# Patient Record
Sex: Female | Born: 1935 | Race: Black or African American | Hispanic: No | Smoking: Former smoker
Health system: Southern US, Community
[De-identification: ages and names within clinical notes are randomized; demographics above are authoritative.]

## PROBLEM LIST (undated history)

## (undated) DIAGNOSIS — K219 Gastro-esophageal reflux disease without esophagitis: Secondary | ICD-10-CM

## (undated) DIAGNOSIS — E119 Type 2 diabetes mellitus without complications: Secondary | ICD-10-CM

## (undated) DIAGNOSIS — E7800 Pure hypercholesterolemia, unspecified: Secondary | ICD-10-CM

## (undated) DIAGNOSIS — M1990 Unspecified osteoarthritis, unspecified site: Secondary | ICD-10-CM

## (undated) DIAGNOSIS — I10 Essential (primary) hypertension: Secondary | ICD-10-CM

## (undated) HISTORY — DX: Pure hypercholesterolemia, unspecified: E78.00

## (undated) HISTORY — PX: ABDOMINAL HYSTERECTOMY: SHX81

## (undated) HISTORY — PX: JOINT REPLACEMENT: SHX530

## (undated) HISTORY — DX: Gastro-esophageal reflux disease without esophagitis: K21.9

## (undated) HISTORY — PX: EYE SURGERY: SHX253

## (undated) HISTORY — PX: FRACTURE SURGERY: SHX138

---

## 1978-12-30 HISTORY — PX: HYSTERECTOMY: SHX81

## 1993-10-06 ENCOUNTER — Ambulatory Visit: Admission: RE | Admit: 1993-10-06 | Payer: Self-pay | Source: Ambulatory Visit | Admitting: Gastroenterology

## 1993-10-13 ENCOUNTER — Ambulatory Visit
Admit: 1993-10-13 | Disposition: A | Discharge status: Home / Self Care | Payer: Self-pay | Source: Ambulatory Visit | Admitting: Rheumatology

## 1993-11-04 ENCOUNTER — Inpatient Hospital Stay
Admission: EM | Admit: 1993-11-04 | Disposition: A | Discharge status: Home / Self Care | Payer: Self-pay | Source: Emergency Department | Admitting: Internal Medicine

## 1994-03-15 ENCOUNTER — Ambulatory Visit
Admit: 1994-03-15 | Disposition: A | Discharge status: Home / Self Care | Payer: Self-pay | Source: Ambulatory Visit | Admitting: Internal Medicine

## 1994-05-28 ENCOUNTER — Ambulatory Visit
Admit: 1994-05-28 | Disposition: A | Discharge status: Home / Self Care | Payer: Self-pay | Source: Ambulatory Visit | Admitting: Internal Medicine

## 1994-06-21 ENCOUNTER — Inpatient Hospital Stay
Admission: EM | Admit: 1994-06-21 | Disposition: A | Discharge status: Home / Self Care | Payer: Self-pay | Source: Emergency Department | Admitting: Internal Medicine

## 1994-07-06 ENCOUNTER — Emergency Department: Admit: 1994-07-06 | Payer: Self-pay | Admitting: Emergency Medicine

## 1994-09-30 ENCOUNTER — Emergency Department: Admit: 1994-09-30 | Payer: Self-pay | Admitting: Emergency Medicine

## 1995-03-22 ENCOUNTER — Ambulatory Visit: Admission: RE | Admit: 1995-03-22 | Payer: Self-pay | Source: Ambulatory Visit | Admitting: Gastroenterology

## 1995-04-17 ENCOUNTER — Inpatient Hospital Stay
Admission: EM | Admit: 1995-04-17 | Disposition: A | Discharge status: Home / Self Care | Payer: Self-pay | Source: Emergency Department | Admitting: Internal Medicine

## 1996-02-28 ENCOUNTER — Ambulatory Visit: Admission: RE | Admit: 1996-02-28 | Payer: Self-pay | Source: Ambulatory Visit | Admitting: Gastroenterology

## 1996-03-11 ENCOUNTER — Ambulatory Visit
Admit: 1996-03-11 | Disposition: A | Discharge status: Home / Self Care | Payer: Self-pay | Source: Ambulatory Visit | Admitting: Internal Medicine

## 1996-11-05 ENCOUNTER — Ambulatory Visit
Admit: 1996-11-05 | Disposition: A | Discharge status: Home / Self Care | Payer: Self-pay | Source: Ambulatory Visit | Admitting: Internal Medicine

## 1996-12-18 ENCOUNTER — Ambulatory Visit: Admission: RE | Admit: 1996-12-18 | Payer: Self-pay | Source: Ambulatory Visit | Admitting: Gastroenterology

## 1997-03-05 ENCOUNTER — Ambulatory Visit
Admit: 1997-03-05 | Disposition: A | Discharge status: Home / Self Care | Payer: Self-pay | Source: Ambulatory Visit | Admitting: Internal Medicine

## 1997-06-08 ENCOUNTER — Ambulatory Visit
Admit: 1997-06-08 | Disposition: A | Discharge status: Home / Self Care | Payer: Self-pay | Source: Ambulatory Visit | Admitting: Gastroenterology

## 1997-11-08 ENCOUNTER — Ambulatory Visit
Admit: 1997-11-08 | Disposition: A | Discharge status: Home / Self Care | Payer: Self-pay | Source: Ambulatory Visit | Admitting: Obstetrics & Gynecology

## 1997-11-16 ENCOUNTER — Ambulatory Visit
Admit: 1997-11-16 | Disposition: A | Discharge status: Home / Self Care | Payer: Self-pay | Source: Ambulatory Visit | Admitting: Obstetrics & Gynecology

## 1997-12-28 ENCOUNTER — Ambulatory Visit
Admit: 1997-12-28 | Disposition: A | Discharge status: Home / Self Care | Payer: Self-pay | Source: Ambulatory Visit | Admitting: Internal Medicine

## 1998-05-04 ENCOUNTER — Ambulatory Visit
Admit: 1998-05-04 | Disposition: A | Discharge status: Home / Self Care | Payer: Self-pay | Source: Ambulatory Visit | Admitting: Internal Medicine

## 1998-08-18 ENCOUNTER — Ambulatory Visit
Admit: 1998-08-18 | Disposition: A | Discharge status: Home / Self Care | Payer: Self-pay | Source: Ambulatory Visit | Admitting: Ophthalmology

## 1999-02-01 ENCOUNTER — Ambulatory Visit
Admit: 1999-02-01 | Disposition: A | Discharge status: Home / Self Care | Payer: Self-pay | Source: Ambulatory Visit | Admitting: Internal Medicine

## 1999-02-13 ENCOUNTER — Ambulatory Visit
Admit: 1999-02-13 | Disposition: A | Discharge status: Home / Self Care | Payer: Self-pay | Source: Ambulatory Visit | Admitting: Internal Medicine

## 1999-02-22 ENCOUNTER — Ambulatory Visit
Admit: 1999-02-22 | Disposition: A | Discharge status: Home / Self Care | Payer: Self-pay | Source: Ambulatory Visit | Admitting: Internal Medicine

## 1999-08-05 ENCOUNTER — Emergency Department: Admit: 1999-08-05 | Payer: Self-pay | Source: Ambulatory Visit | Admitting: Emergency Medicine

## 1999-08-07 ENCOUNTER — Ambulatory Visit
Admit: 1999-08-07 | Disposition: A | Discharge status: Home / Self Care | Payer: Self-pay | Source: Ambulatory Visit | Admitting: Internal Medicine

## 1999-10-31 ENCOUNTER — Ambulatory Visit
Admit: 1999-10-31 | Disposition: A | Discharge status: Home / Self Care | Payer: Self-pay | Source: Ambulatory Visit | Admitting: Internal Medicine

## 2000-01-15 ENCOUNTER — Ambulatory Visit
Admit: 2000-01-15 | Disposition: A | Discharge status: Home / Self Care | Payer: Self-pay | Source: Ambulatory Visit | Admitting: Family Medicine

## 2000-05-15 ENCOUNTER — Ambulatory Visit
Admit: 2000-05-15 | Disposition: A | Discharge status: Home / Self Care | Payer: Self-pay | Source: Ambulatory Visit | Admitting: Family Medicine

## 2000-07-02 ENCOUNTER — Ambulatory Visit
Admit: 2000-07-02 | Disposition: A | Discharge status: Home / Self Care | Payer: Self-pay | Source: Ambulatory Visit | Admitting: Internal Medicine

## 2000-10-07 ENCOUNTER — Ambulatory Visit
Admit: 2000-10-07 | Disposition: A | Discharge status: Home / Self Care | Payer: Self-pay | Source: Ambulatory Visit | Admitting: Gastroenterology

## 2000-10-09 ENCOUNTER — Ambulatory Visit
Admit: 2000-10-09 | Disposition: A | Discharge status: Home / Self Care | Payer: Self-pay | Source: Ambulatory Visit | Admitting: Rheumatology

## 2000-10-13 ENCOUNTER — Inpatient Hospital Stay
Admission: EM | Admit: 2000-10-13 | Disposition: A | Discharge status: Home / Self Care | Payer: Self-pay | Source: Emergency Department | Admitting: Internal Medicine

## 2000-12-13 ENCOUNTER — Ambulatory Visit
Admit: 2000-12-13 | Disposition: A | Discharge status: Home / Self Care | Payer: Self-pay | Source: Ambulatory Visit | Admitting: Internal Medicine

## 2001-01-28 ENCOUNTER — Ambulatory Visit: Admit: 2001-01-28 | Disposition: A | Discharge status: Home / Self Care | Payer: Self-pay | Source: Ambulatory Visit

## 2001-01-31 ENCOUNTER — Ambulatory Visit
Admit: 2001-01-31 | Disposition: A | Discharge status: Home / Self Care | Payer: Self-pay | Source: Ambulatory Visit | Admitting: Rheumatology

## 2001-02-17 ENCOUNTER — Ambulatory Visit
Admit: 2001-02-17 | Disposition: A | Discharge status: Home / Self Care | Payer: Self-pay | Source: Ambulatory Visit | Admitting: Internal Medicine

## 2001-06-07 ENCOUNTER — Ambulatory Visit
Admit: 2001-06-07 | Disposition: A | Discharge status: Home / Self Care | Payer: Self-pay | Source: Ambulatory Visit | Admitting: Rheumatology

## 2001-09-20 ENCOUNTER — Ambulatory Visit
Admit: 2001-09-20 | Disposition: A | Discharge status: Home / Self Care | Payer: Self-pay | Source: Ambulatory Visit | Admitting: Rheumatology

## 2001-10-27 ENCOUNTER — Ambulatory Visit
Admit: 2001-10-27 | Disposition: A | Discharge status: Home / Self Care | Payer: Self-pay | Source: Ambulatory Visit | Admitting: Internal Medicine

## 2001-11-17 ENCOUNTER — Ambulatory Visit
Admit: 2001-11-17 | Disposition: A | Discharge status: Home / Self Care | Payer: Self-pay | Source: Ambulatory Visit | Admitting: Internal Medicine

## 2001-12-09 ENCOUNTER — Ambulatory Visit: Admission: RE | Admit: 2001-12-09 | Payer: Self-pay | Source: Ambulatory Visit | Admitting: Ophthalmology

## 2001-12-29 ENCOUNTER — Ambulatory Visit
Admit: 2001-12-29 | Disposition: A | Discharge status: Home / Self Care | Payer: Self-pay | Source: Ambulatory Visit | Admitting: Rheumatology

## 2002-07-13 ENCOUNTER — Ambulatory Visit
Admit: 2002-07-13 | Disposition: A | Discharge status: Home / Self Care | Payer: Self-pay | Source: Ambulatory Visit | Admitting: Internal Medicine

## 2002-10-02 ENCOUNTER — Ambulatory Visit
Admit: 2002-10-02 | Disposition: A | Discharge status: Home / Self Care | Payer: Self-pay | Source: Ambulatory Visit | Admitting: Internal Medicine

## 2002-12-24 ENCOUNTER — Ambulatory Visit: Admit: 2002-12-24 | Disposition: A | Discharge status: Home / Self Care | Payer: Self-pay | Source: Ambulatory Visit

## 2003-01-11 ENCOUNTER — Ambulatory Visit
Admit: 2003-01-11 | Disposition: A | Discharge status: Home / Self Care | Payer: Self-pay | Source: Ambulatory Visit | Admitting: Internal Medicine

## 2003-01-29 ENCOUNTER — Ambulatory Visit
Admit: 2003-01-29 | Disposition: A | Discharge status: Home / Self Care | Payer: Self-pay | Source: Ambulatory Visit | Admitting: Orthopaedic Surgery

## 2003-02-02 ENCOUNTER — Inpatient Hospital Stay
Admission: RE | Admit: 2003-02-02 | Disposition: A | Discharge status: Home / Self Care | Payer: Self-pay | Source: Ambulatory Visit | Admitting: Orthopaedic Surgery

## 2003-05-01 HISTORY — PX: JOINT REPLACEMENT: SHX530

## 2003-08-19 ENCOUNTER — Ambulatory Visit
Admit: 2003-08-19 | Disposition: A | Discharge status: Home / Self Care | Payer: Self-pay | Source: Ambulatory Visit | Admitting: Internal Medicine

## 2003-11-02 ENCOUNTER — Ambulatory Visit
Admit: 2003-11-02 | Disposition: A | Discharge status: Home / Self Care | Payer: Self-pay | Source: Ambulatory Visit | Admitting: Internal Medicine

## 2003-11-05 ENCOUNTER — Ambulatory Visit
Admit: 2003-11-05 | Disposition: A | Discharge status: Home / Self Care | Payer: Self-pay | Source: Ambulatory Visit | Admitting: Internal Medicine

## 2004-01-10 ENCOUNTER — Ambulatory Visit
Admit: 2004-01-10 | Disposition: A | Discharge status: Home / Self Care | Payer: Self-pay | Source: Ambulatory Visit | Admitting: Internal Medicine

## 2004-01-15 ENCOUNTER — Inpatient Hospital Stay
Admission: EM | Admit: 2004-01-15 | Disposition: A | Discharge status: Home / Self Care | Payer: Self-pay | Source: Emergency Department | Admitting: Internal Medicine

## 2004-03-29 ENCOUNTER — Ambulatory Visit
Admit: 2004-03-29 | Disposition: A | Discharge status: Home / Self Care | Payer: Self-pay | Source: Ambulatory Visit | Admitting: Orthopaedic Surgery

## 2004-04-10 ENCOUNTER — Emergency Department: Admit: 2004-04-10 | Payer: Self-pay | Admitting: Emergency Medicine

## 2004-07-26 ENCOUNTER — Ambulatory Visit
Admit: 2004-07-26 | Disposition: A | Discharge status: Home / Self Care | Payer: Self-pay | Source: Ambulatory Visit | Admitting: Internal Medicine

## 2004-08-07 ENCOUNTER — Ambulatory Visit
Admit: 2004-08-07 | Disposition: A | Discharge status: Home / Self Care | Payer: Self-pay | Source: Ambulatory Visit | Admitting: Internal Medicine

## 2005-02-08 ENCOUNTER — Ambulatory Visit: Admit: 2005-02-08 | Disposition: A | Discharge status: Home / Self Care | Payer: Self-pay | Source: Ambulatory Visit

## 2005-05-07 ENCOUNTER — Ambulatory Visit
Admit: 2005-05-07 | Disposition: A | Discharge status: Home / Self Care | Payer: Self-pay | Source: Ambulatory Visit | Admitting: Internal Medicine

## 2005-05-22 ENCOUNTER — Ambulatory Visit
Admit: 2005-05-22 | Disposition: A | Discharge status: Home / Self Care | Payer: Self-pay | Source: Ambulatory Visit | Admitting: Internal Medicine

## 2005-05-22 LAB — COMPREHENSIVE METABOLIC PANEL
ALT: 32 U/L (ref 9–52)
AST (SGOT): 26 U/L (ref 8–39)
Albumin/Globulin Ratio: 1 — ABNORMAL LOW (ref 1.1–1.8)
Albumin: 3.8 G/DL (ref 3.7–5.1)
Alkaline Phosphatase: 89 U/L (ref 43–122)
BUN: 20 MG/DL (ref 7–21)
Bilirubin, Total: 0.2 MG/DL (ref 0.2–1.3)
CO2: 26 MEQ/L (ref 22–31)
Calcium: 9.6 MG/DL (ref 8.6–10.2)
Chloride: 103 MEQ/L (ref 98–107)
Creatinine: 0.9 MG/DL (ref 0.5–1.4)
Globulin: 4 G/DL — ABNORMAL HIGH (ref 2.0–3.7)
Glucose: 62 MG/DL — ABNORMAL LOW (ref 70–105)
Potassium: 4 MEQ/L (ref 3.6–5.0)
Protein, Total: 7.8 G/DL (ref 6.0–8.0)
Sodium: 143 MEQ/L (ref 136–143)

## 2005-05-22 LAB — CBC- CERNER
Hematocrit: 32.9 % — ABNORMAL LOW (ref 37.0–47.0)
Hgb: 11 G/DL — ABNORMAL LOW (ref 13.0–17.0)
MCH: 30.9 PG (ref 28.0–32.0)
MCHC: 33.5 G/DL (ref 32.0–36.0)
MCV: 92.2 FL (ref 80.0–100.0)
MPV: 8.5 FL (ref 7.4–10.4)
Platelets: 260 /mm3 (ref 140–400)
RBC: 3.57 /mm3 — ABNORMAL LOW (ref 4.20–5.40)
RDW: 14.5 % (ref 11.5–15.0)
WBC: 9.3 /mm3 (ref 3.5–10.8)

## 2005-05-22 LAB — BILIRUBIN, DIRECT: Bilirubin Direct: 0 MG/DL — AB (ref 0.0–0.3)

## 2005-05-22 LAB — CALCIUM IONIZED-CALC. CERNER: Calcium Ionized Calculated: 2 mEQ/L (ref 1.9–2.3)

## 2005-05-22 LAB — GFR

## 2005-05-29 ENCOUNTER — Ambulatory Visit: Admission: RE | Admit: 2005-05-29 | Payer: Self-pay | Source: Ambulatory Visit | Admitting: Pediatrics

## 2005-07-13 ENCOUNTER — Emergency Department: Admit: 2005-07-13 | Payer: Self-pay | Source: Emergency Department | Admitting: Emergency Medicine

## 2005-07-13 LAB — CBC WITH AUTO DIFFERENTIAL CERNER
Basophils Absolute: 0 /mm3 (ref 0.0–0.2)
Basophils: 0 % (ref 0–2)
Eosinophils Absolute: 0.1 /mm3 (ref 0.0–0.7)
Eosinophils: 1 % (ref 0–5)
Granulocytes Absolute: 6.1 /mm3 (ref 1.8–8.1)
Hematocrit: 35.8 % — ABNORMAL LOW (ref 37.0–47.0)
Hgb: 12 G/DL — ABNORMAL LOW (ref 13.0–17.0)
Lymphocytes Absolute: 0.4 /mm3 — ABNORMAL LOW (ref 0.5–4.4)
Lymphocytes: 5 % — ABNORMAL LOW (ref 15–41)
MCH: 31.2 PG (ref 28.0–32.0)
MCHC: 33.4 G/DL (ref 32.0–36.0)
MCV: 93.3 FL (ref 80.0–100.0)
MPV: 9.2 FL (ref 7.4–10.4)
Monocytes Absolute: 0.4 /mm3 (ref 0.0–1.2)
Monocytes: 6 % (ref 0–11)
Neutrophils %: 88 % — ABNORMAL HIGH (ref 52–75)
Platelets: 244 /mm3 (ref 140–400)
RBC: 3.83 /mm3 — ABNORMAL LOW (ref 4.20–5.40)
RDW: 15.9 % — ABNORMAL HIGH (ref 11.5–15.0)
WBC: 6.9 /mm3 (ref 3.5–10.8)

## 2005-07-13 LAB — URINALYSIS
Bilirubin, UA: NEGATIVE
Blood, UA: NEGATIVE
Glucose, UA: NEGATIVE
Leukocyte Esterase, UA: NEGATIVE
Nitrite, UA: NEGATIVE
Protein, UR: NEGATIVE
Specific Gravity UA POCT: 1.025 (ref <=1.030)
Urine pH: 5 (ref 5.0–8.0)
Urobilinogen, UA: 0.2

## 2005-07-13 LAB — COMPREHENSIVE METABOLIC PANEL
ALT: 43 U/L (ref 9–52)
AST (SGOT): 29 U/L (ref 8–39)
Albumin/Globulin Ratio: 0.9 — ABNORMAL LOW (ref 1.1–1.8)
Albumin: 3.4 G/DL — ABNORMAL LOW (ref 3.7–5.1)
Alkaline Phosphatase: 87 U/L (ref 43–122)
BUN: 28 MG/DL — ABNORMAL HIGH (ref 7–21)
Bilirubin, Total: 0.4 MG/DL (ref 0.2–1.3)
CO2: 20 MEQ/L — ABNORMAL LOW (ref 22–31)
Calcium: 9.4 MG/DL (ref 8.6–10.2)
Chloride: 108 MEQ/L — ABNORMAL HIGH (ref 98–107)
Creatinine: 0.9 MG/DL (ref 0.5–1.4)
Globulin: 4 G/DL — ABNORMAL HIGH (ref 2.0–3.7)
Glucose: 211 MG/DL — ABNORMAL HIGH (ref 70–105)
Potassium: 3.9 MEQ/L (ref 3.6–5.0)
Protein, Total: 7.4 G/DL (ref 6.0–8.0)
Sodium: 142 MEQ/L (ref 136–143)

## 2005-07-13 LAB — LIPASE: Lipase: 75 U/L (ref 23–300)

## 2005-07-13 LAB — GFR

## 2005-07-13 LAB — CKMB MASS CERNER: CKMB Mass: 2 NG/ML (ref 0.0–5.0)

## 2005-07-13 LAB — CREATINE KINASE W/O REFLEX (SOFT): Creatine Kinase (CK): 186 U/L — ABNORMAL HIGH (ref 20–140)

## 2005-07-13 LAB — AMYLASE: Amylase: 83 U/L (ref 30–110)

## 2005-07-13 LAB — CALCIUM IONIZED-CALC. CERNER: Calcium Ionized Calculated: 2 mEQ/L (ref 1.9–2.3)

## 2005-07-13 LAB — TROPONIN I QUANTITATIVE LEVEL CERNER: Troponin I: <0.08 ng/mL

## 2005-09-07 ENCOUNTER — Ambulatory Visit
Admit: 2005-09-07 | Disposition: A | Discharge status: Home / Self Care | Payer: Self-pay | Source: Ambulatory Visit | Admitting: Internal Medicine

## 2005-09-07 LAB — BASIC METABOLIC PANEL
BUN: 21 MG/DL (ref 7–21)
CO2: 27 MEQ/L (ref 22–31)
Calcium: 9.9 MG/DL (ref 8.6–10.2)
Chloride: 104 MEQ/L (ref 98–107)
Creatinine: 1 MG/DL (ref 0.5–1.4)
Glucose: 97 MG/DL (ref 70–105)
Potassium: 4.1 MEQ/L (ref 3.6–5.0)
Sodium: 141 MEQ/L (ref 136–143)

## 2005-09-07 LAB — GFR

## 2005-09-07 LAB — GLYCO HEMOGLOBIN A1C CERNER: HgA1C Calc: 8.1 % — ABNORMAL HIGH (ref 4.8–6.0)

## 2005-11-05 ENCOUNTER — Emergency Department: Admit: 2005-11-05 | Payer: Self-pay | Source: Emergency Department | Admitting: Emergency Medicine

## 2005-11-05 LAB — CBC WITH AUTO DIFFERENTIAL CERNER
Basophils Absolute: 0 /mm3 (ref 0.0–0.2)
Basophils: 0 % (ref 0–2)
Eosinophils Absolute: 0.1 /mm3 (ref 0.0–0.7)
Eosinophils: 2 % (ref 0–5)
Granulocytes Absolute: 4.4 /mm3 (ref 1.8–8.1)
Hematocrit: 29.6 % — ABNORMAL LOW (ref 37.0–47.0)
Hgb: 10.1 G/DL — ABNORMAL LOW (ref 13.0–17.0)
Lymphocytes Absolute: 0.8 /mm3 (ref 0.5–4.4)
Lymphocytes: 14 % — ABNORMAL LOW (ref 15–41)
MCH: 31.6 PG (ref 28.0–32.0)
MCHC: 34.2 G/DL (ref 32.0–36.0)
MCV: 92.4 FL (ref 80.0–100.0)
MPV: 8.7 FL (ref 7.4–10.4)
Monocytes Absolute: 0.7 /mm3 (ref 0.0–1.2)
Monocytes: 11 % (ref 0–11)
Neutrophils %: 73 % (ref 52–75)
Platelets: 241 /mm3 (ref 140–400)
RBC: 3.2 /mm3 — ABNORMAL LOW (ref 4.20–5.40)
RDW: 16.4 % — ABNORMAL HIGH (ref 11.5–15.0)
WBC: 6.1 /mm3 (ref 3.5–10.8)

## 2005-11-05 LAB — BASIC METABOLIC PANEL
BUN: 20 MG/DL (ref 7–21)
CO2: 25 MEQ/L (ref 22–31)
Calcium: 8.8 MG/DL (ref 8.6–10.2)
Chloride: 106 MEQ/L (ref 98–107)
Creatinine: 0.8 MG/DL (ref 0.5–1.4)
Glucose: 73 MG/DL (ref 70–105)
Potassium: 3.9 MEQ/L (ref 3.6–5.0)
Sodium: 141 MEQ/L (ref 136–143)

## 2005-11-05 LAB — GFR

## 2005-11-05 LAB — CKMB MASS CERNER: CKMB Mass: 0.9 NG/ML — AB (ref 0.0–5.0)

## 2005-11-05 LAB — B-TYPE NATRIURETIC PEPTIDE: B-Natriuretic Peptide: 44 pg/mL (ref ?–100)

## 2005-11-05 LAB — CREATINE KINASE W/O REFLEX (SOFT): Creatine Kinase (CK): 147 U/L — ABNORMAL HIGH (ref 20–140)

## 2005-11-05 LAB — TROPONIN I QUANTITATIVE LEVEL CERNER: Troponin I: <0.08 ng/mL

## 2006-03-07 ENCOUNTER — Ambulatory Visit
Admit: 2006-03-07 | Disposition: A | Discharge status: Home / Self Care | Payer: Self-pay | Source: Ambulatory Visit | Admitting: Internal Medicine

## 2006-03-07 LAB — COMPREHENSIVE METABOLIC PANEL
ALT: 28 U/L (ref 9–52)
AST (SGOT): 29 U/L (ref 8–39)
Albumin/Globulin Ratio: 1 — ABNORMAL LOW (ref 1.1–1.8)
Albumin: 3.7 G/DL (ref 3.7–5.1)
Alkaline Phosphatase: 87 U/L (ref 43–122)
BUN: 18 MG/DL (ref 7–21)
Bilirubin, Total: 0.2 MG/DL (ref 0.2–1.3)
CO2: 25 MEQ/L (ref 22–31)
Calcium: 9.2 MG/DL (ref 8.6–10.2)
Chloride: 104 MEQ/L (ref 98–107)
Creatinine: 0.8 MG/DL (ref 0.5–1.4)
Globulin: 3.6 G/DL (ref 2.0–3.7)
Glucose: 67 MG/DL — ABNORMAL LOW (ref 70–105)
Potassium: 3.8 MEQ/L (ref 3.6–5.0)
Protein, Total: 7.3 G/DL (ref 6.0–8.0)
Sodium: 141 MEQ/L (ref 136–143)

## 2006-03-07 LAB — CBC- CERNER
Hematocrit: 32.7 % — ABNORMAL LOW (ref 37.0–47.0)
Hgb: 11.1 G/DL — ABNORMAL LOW (ref 13.0–17.0)
MCH: 32 PG (ref 28.0–32.0)
MCHC: 33.9 G/DL (ref 32.0–36.0)
MCV: 94.6 FL (ref 80.0–100.0)
MPV: 9.1 FL (ref 7.4–10.4)
Platelets: 247 /mm3 (ref 140–400)
RBC: 3.46 /mm3 — ABNORMAL LOW (ref 4.20–5.40)
RDW: 16.3 % — ABNORMAL HIGH (ref 11.5–15.0)
WBC: 7.8 /mm3 (ref 3.5–10.8)

## 2006-03-07 LAB — URINALYSIS
Bilirubin, UA: NEGATIVE
Glucose, UA: NEGATIVE
Ketones UA: NEGATIVE
Leukocyte Esterase, UA: NEGATIVE
Nitrite, UA: NEGATIVE
Protein, UR: NEGATIVE
Specific Gravity UA POCT: 1.015 (ref <=1.030)
Urine pH: 5.5 (ref 5.0–8.0)
Urobilinogen, UA: 0.2

## 2006-03-07 LAB — LIPID PANEL
Cholesterol: 221 MG/DL — ABNORMAL HIGH (ref 50–200)
HDL: 53 MG/DL (ref 35–86)
LDL Calculated: 141 MG/DL — ABNORMAL HIGH (ref 0–130)
Triglycerides: 133 MG/DL (ref 35–135)
VLDL Calculated: 27 MG/DL (ref 10–40)

## 2006-03-07 LAB — BILIRUBIN, DIRECT: Bilirubin Direct: 0 MG/DL — AB (ref 0.0–0.3)

## 2006-03-07 LAB — CALCIUM IONIZED-CALC. CERNER: Calcium Ionized Calculated: 2 mEQ/L (ref 1.9–2.3)

## 2006-03-07 LAB — URINE MICROSCOPIC

## 2006-03-07 LAB — GFR

## 2006-03-07 LAB — GLYCO HEMOGLOBIN A1C CERNER: HgA1C Calc: 7.5 % — ABNORMAL HIGH (ref 4.8–6.0)

## 2006-07-04 ENCOUNTER — Ambulatory Visit
Admit: 2006-07-04 | Disposition: A | Discharge status: Home / Self Care | Payer: Self-pay | Source: Ambulatory Visit | Admitting: Internal Medicine

## 2006-07-26 ENCOUNTER — Ambulatory Visit
Admit: 2006-07-26 | Disposition: A | Discharge status: Home / Self Care | Payer: Self-pay | Source: Ambulatory Visit | Admitting: Internal Medicine

## 2006-08-30 ENCOUNTER — Ambulatory Visit
Admit: 2006-08-30 | Disposition: A | Discharge status: Home / Self Care | Payer: Self-pay | Source: Ambulatory Visit | Admitting: Podiatrist

## 2006-09-12 ENCOUNTER — Ambulatory Visit
Admit: 2006-09-12 | Disposition: A | Discharge status: Home / Self Care | Payer: Self-pay | Source: Ambulatory Visit | Admitting: Rheumatology

## 2006-09-12 LAB — COMPREHENSIVE METABOLIC PANEL
ALT: 19 U/L (ref 9–52)
AST (SGOT): 27 U/L (ref 8–39)
Albumin/Globulin Ratio: 0.8 — ABNORMAL LOW (ref 1.1–1.8)
Albumin: 4 G/DL (ref 3.7–5.1)
Alkaline Phosphatase: 122 U/L (ref 43–122)
BUN: 21 MG/DL (ref 7–21)
Bilirubin, Total: <0.1 MG/DL (ref 0.2–1.3)
CO2: 27 MEQ/L (ref 22–31)
Calcium: 8.9 MG/DL (ref 8.6–10.2)
Chloride: 101 MEQ/L (ref 98–107)
Creatinine: 0.8 MG/DL (ref 0.5–1.4)
Globulin: 5.2 G/DL — ABNORMAL HIGH (ref 2.0–3.7)
Glucose: 176 MG/DL — ABNORMAL HIGH (ref 70–105)
Potassium: 4.6 MEQ/L (ref 3.6–5.0)
Protein, Total: 9.2 G/DL — ABNORMAL HIGH (ref 6.0–8.0)
Sodium: 140 MEQ/L (ref 136–143)

## 2006-09-12 LAB — CBC- CERNER
Hematocrit: 30.6 % — ABNORMAL LOW (ref 37.0–47.0)
Hgb: 10.2 G/DL — ABNORMAL LOW (ref 12.0–16.0)
MCH: 30.4 PG (ref 28.0–32.0)
MCHC: 33.4 G/DL (ref 32.0–36.0)
MCV: 90.8 FL (ref 80.0–100.0)
MPV: 8.4 FL (ref 7.4–10.4)
Platelets: 332 /mm3 (ref 140–400)
RBC: 3.37 /mm3 — ABNORMAL LOW (ref 4.20–5.40)
RDW: 16 % — ABNORMAL HIGH (ref 11.5–15.0)
WBC: 9.6 /mm3 (ref 3.5–10.8)

## 2006-09-12 LAB — GFR

## 2006-09-12 LAB — CALCIUM IONIZED-CALC. CERNER: Calcium Ionized Calculated: 1.7 mEQ/L — ABNORMAL LOW (ref 1.9–2.3)

## 2006-09-12 LAB — C-REACTIVE PROTEIN: C-Reactive Protein: 2.2 mg/dl — ABNORMAL HIGH (ref ?–0.9)

## 2006-09-13 LAB — MISCELLANEOUS QUEST TEST

## 2006-09-13 LAB — HEPATITIS B SURFACE ANTIGEN W/ REFLEX TO CONFIRMATION: Hepatitis B Surface Antigen: NEGATIVE

## 2006-09-13 LAB — RHEUMATOID FACTOR: RA Quant: 1:640 {titer}

## 2006-09-13 LAB — HEPATITIS C ANTIBODY: Hepatitis C, AB: NEGATIVE

## 2006-09-30 ENCOUNTER — Ambulatory Visit
Admit: 2006-09-30 | Disposition: A | Discharge status: Home / Self Care | Payer: Self-pay | Source: Ambulatory Visit | Admitting: Orthopaedic Surgery

## 2006-11-05 ENCOUNTER — Ambulatory Visit
Admit: 2006-11-05 | Disposition: A | Discharge status: Home / Self Care | Payer: Self-pay | Source: Ambulatory Visit | Admitting: Orthopaedic Surgery

## 2006-11-12 ENCOUNTER — Ambulatory Visit
Admit: 2006-11-12 | Disposition: A | Discharge status: Home / Self Care | Payer: Self-pay | Source: Ambulatory Visit | Admitting: Orthopaedic Surgery

## 2006-12-06 ENCOUNTER — Ambulatory Visit
Admit: 2006-12-06 | Disposition: A | Discharge status: Home / Self Care | Payer: Self-pay | Source: Ambulatory Visit | Admitting: Internal Medicine

## 2006-12-06 LAB — CBC- CERNER
Hematocrit: 30.4 % — ABNORMAL LOW (ref 37.0–47.0)
Hgb: 10.1 G/DL — ABNORMAL LOW (ref 12.0–16.0)
MCH: 30.3 PG (ref 28.0–32.0)
MCHC: 33.4 G/DL (ref 32.0–36.0)
MCV: 90.7 FL (ref 80.0–100.0)
MPV: 7.5 FL (ref 7.4–10.4)
Platelets: 290 /mm3 (ref 140–400)
RBC: 3.35 /mm3 — ABNORMAL LOW (ref 4.20–5.40)
RDW: 16 % — ABNORMAL HIGH (ref 11.5–15.0)
WBC: 6.2 /mm3 (ref 3.5–10.8)

## 2006-12-06 LAB — COMPREHENSIVE METABOLIC PANEL
ALT: 264 U/L — ABNORMAL HIGH (ref 9–52)
AST (SGOT): 211 U/L — ABNORMAL HIGH (ref 8–39)
Albumin/Globulin Ratio: 0.9 — ABNORMAL LOW (ref 1.1–1.8)
Albumin: 3.5 G/DL — ABNORMAL LOW (ref 3.7–5.1)
Alkaline Phosphatase: 350 U/L — ABNORMAL HIGH (ref 43–122)
BUN: 25 MG/DL — ABNORMAL HIGH (ref 7–21)
Bilirubin, Total: 0.1 MG/DL — AB (ref 0.2–1.3)
CO2: 27 MEQ/L (ref 22–31)
Calcium: 9.1 MG/DL (ref 8.6–10.2)
Chloride: 108 MEQ/L — ABNORMAL HIGH (ref 98–107)
Creatinine: 0.8 MG/DL (ref 0.5–1.4)
Globulin: 3.9 G/DL — ABNORMAL HIGH (ref 2.0–3.7)
Glucose: 108 MG/DL — ABNORMAL HIGH (ref 70–105)
Potassium: 4.6 MEQ/L (ref 3.6–5.0)
Protein, Total: 7.4 G/DL (ref 6.0–8.0)
Sodium: 144 MEQ/L — ABNORMAL HIGH (ref 136–143)

## 2006-12-06 LAB — GFR

## 2006-12-06 LAB — GLYCO HEMOGLOBIN A1C CERNER: HgA1C Calc: 6.9 % — ABNORMAL HIGH (ref 4.8–6.0)

## 2006-12-06 LAB — BILIRUBIN, DIRECT: Bilirubin Direct: 0 MG/DL — AB (ref 0.0–0.3)

## 2006-12-06 LAB — CALCIUM IONIZED-CALC. CERNER: Calcium Ionized Calculated: 1.9 mEQ/L (ref 1.9–2.3)

## 2006-12-10 ENCOUNTER — Inpatient Hospital Stay
Admission: EM | Admit: 2006-12-10 | Disposition: A | Discharge status: Skilled Nursing Facility | Payer: Self-pay | Source: Emergency Department | Admitting: Internal Medicine

## 2006-12-10 LAB — CBC WITH MANUAL DIFF- CERNER
Eosinophils %: 3 % (ref 0–5)
Hematocrit: 25.6 % — ABNORMAL LOW (ref 37.0–47.0)
Hgb: 8.7 G/DL — ABNORMAL LOW (ref 12.0–16.0)
Lymphocytes Manual: 16 % (ref 15–41)
MCH: 30.4 PG (ref 28.0–32.0)
MCHC: 34 G/DL (ref 32.0–36.0)
MCV: 89.5 FL (ref 80.0–100.0)
MPV: 9.1 FL (ref 7.4–10.4)
Monocytes Manual: 10 % — ABNORMAL HIGH (ref 0–8)
Neutrophils %: 71 % (ref 52–75)
Platelets: 285 /mm3 (ref 140–400)
RBC Morphology: ABNORMAL
RBC: 2.86 /mm3 — ABNORMAL LOW (ref 4.20–5.40)
RDW: 15.7 % — ABNORMAL HIGH (ref 11.5–15.0)
WBC: 9.6 /mm3 (ref 3.5–10.8)

## 2006-12-10 LAB — COMPREHENSIVE METABOLIC PANEL
ALT: 77 U/L — ABNORMAL HIGH (ref 9–52)
AST (SGOT): 36 U/L (ref 8–39)
Albumin/Globulin Ratio: 0.9 — ABNORMAL LOW (ref 1.1–1.8)
Albumin: 3.1 G/DL — ABNORMAL LOW (ref 3.7–5.1)
Alkaline Phosphatase: 183 U/L — ABNORMAL HIGH (ref 43–122)
BUN: 33 MG/DL — ABNORMAL HIGH (ref 7–21)
Bilirubin, Total: <0.1 MG/DL (ref 0.2–1.3)
CO2: 23 MEQ/L (ref 22–31)
Calcium: 9.1 MG/DL (ref 8.6–10.2)
Chloride: 108 MEQ/L — ABNORMAL HIGH (ref 98–107)
Creatinine: 1 MG/DL (ref 0.5–1.4)
Globulin: 3.4 G/DL (ref 2.0–3.7)
Glucose: 105 MG/DL (ref 70–105)
Potassium: 4 MEQ/L (ref 3.6–5.0)
Protein, Total: 6.5 G/DL (ref 6.0–8.0)
Sodium: 142 MEQ/L (ref 136–143)

## 2006-12-10 LAB — TYPE AND SCREEN
AB Screen Gel: NEGATIVE
ABO Rh: O POS

## 2006-12-10 LAB — PT AND APTT
PT INR: 1.2 {INR} — ABNORMAL HIGH (ref 0.9–1.1)
PT: 14.1 s — ABNORMAL HIGH (ref 10.8–13.3)
PTT: 31 s (ref 21–32)

## 2006-12-10 LAB — GFR

## 2006-12-10 LAB — CALCIUM IONIZED-CALC. CERNER: Calcium Ionized Calculated: 2.1 mEQ/L (ref 1.9–2.3)

## 2006-12-11 LAB — BLEEDING TIME: Bleeding Time: 6 min (ref 1.5–7.0)

## 2006-12-11 LAB — HEMOGLOBIN AND HEMATOCRIT, BLOOD
Hematocrit: 31.8 % — ABNORMAL LOW (ref 37.0–47.0)
Hgb: 11.1 G/DL — ABNORMAL LOW (ref 12.0–16.0)

## 2006-12-12 LAB — PT AND APTT
PT INR: 1.1 {INR} (ref 0.9–1.1)
PT: 12.8 s (ref 10.8–13.3)
PTT: 27 s (ref 21–32)

## 2006-12-12 LAB — HEMOGLOBIN AND HEMATOCRIT, BLOOD
Hematocrit: 36.5 % — ABNORMAL LOW (ref 37.0–47.0)
Hematocrit: 35.5 % — ABNORMAL LOW (ref 37.0–47.0)
Hgb: 12.3 G/DL (ref 12.0–16.0)
Hgb: 12.1 G/DL (ref 12.0–16.0)

## 2006-12-12 LAB — CBC WITH AUTO DIFFERENTIAL CERNER
Basophils Absolute: 0 /mm3 (ref 0.0–0.2)
Basophils Absolute: 0.1 /mm3 (ref 0.0–0.2)
Basophils: 0 % (ref 0–2)
Basophils: 1 % (ref 0–2)
Eosinophils Absolute: 0.1 /mm3 (ref 0.0–0.7)
Eosinophils Absolute: 0.2 /mm3 (ref 0.0–0.7)
Eosinophils: 1 % (ref 0–5)
Eosinophils: 2 % (ref 0–5)
Granulocytes Absolute: 7.5 /mm3 (ref 1.8–8.1)
Granulocytes Absolute: 9.5 /mm3 — ABNORMAL HIGH (ref 1.8–8.1)
Hematocrit: 25.9 % — ABNORMAL LOW (ref 37.0–47.0)
Hematocrit: 27.2 % — ABNORMAL LOW (ref 37.0–47.0)
Hgb: 8.8 G/DL — ABNORMAL LOW (ref 12.0–16.0)
Hgb: 9.4 G/DL — ABNORMAL LOW (ref 12.0–16.0)
Lymphocytes Absolute: 1.2 /mm3 (ref 0.5–4.4)
Lymphocytes Absolute: 1.4 /mm3 (ref 0.5–4.4)
Lymphocytes: 10 % — ABNORMAL LOW (ref 15–41)
Lymphocytes: 14 % — ABNORMAL LOW (ref 15–41)
MCH: 30.1 PG (ref 28.0–32.0)
MCH: 30.1 PG (ref 28.0–32.0)
MCHC: 34 G/DL (ref 32.0–36.0)
MCHC: 34.5 G/DL (ref 32.0–36.0)
MCV: 87.3 FL (ref 80.0–100.0)
MCV: 88.6 FL (ref 80.0–100.0)
MPV: 8.5 FL (ref 7.4–10.4)
MPV: 9.6 FL (ref 7.4–10.4)
Monocytes Absolute: 0.9 /mm3 (ref 0.0–1.2)
Monocytes Absolute: 0.9 /mm3 (ref 0.0–1.2)
Monocytes: 7 % (ref 0–11)
Monocytes: 9 % (ref 0–11)
Neutrophils %: 74 % (ref 52–75)
Neutrophils %: 81 % — ABNORMAL HIGH (ref 52–75)
Platelets: 135 /mm3 — ABNORMAL LOW (ref 140–400)
Platelets: 138 /mm3 — ABNORMAL LOW (ref 140–400)
RBC: 2.93 /mm3 — ABNORMAL LOW (ref 4.20–5.40)
RBC: 3.12 /mm3 — ABNORMAL LOW (ref 4.20–5.40)
RDW: 14.8 % (ref 11.5–15.0)
RDW: 15.8 % — ABNORMAL HIGH (ref 11.5–15.0)
WBC: 10 /mm3 (ref 3.5–10.8)
WBC: 11.8 /mm3 — ABNORMAL HIGH (ref 3.5–10.8)

## 2006-12-12 LAB — GFR

## 2006-12-12 LAB — BASIC METABOLIC PANEL
BUN: 28 MG/DL — ABNORMAL HIGH (ref 7–21)
BUN: 30 MG/DL — ABNORMAL HIGH (ref 7–21)
CO2: 22 MEQ/L (ref 22–31)
CO2: 23 MEQ/L (ref 22–31)
Calcium: 7.2 MG/DL — ABNORMAL LOW (ref 8.6–10.2)
Calcium: 7.3 MG/DL — ABNORMAL LOW (ref 8.6–10.2)
Chloride: 112 MEQ/L — ABNORMAL HIGH (ref 98–107)
Chloride: 114 MEQ/L — ABNORMAL HIGH (ref 98–107)
Creatinine: 0.7 MG/DL (ref 0.5–1.4)
Creatinine: 0.7 MG/DL (ref 0.5–1.4)
Glucose: 108 MG/DL — ABNORMAL HIGH (ref 70–105)
Glucose: 128 MG/DL — ABNORMAL HIGH (ref 70–105)
Potassium: 4.4 MEQ/L (ref 3.6–5.0)
Potassium: 4.8 MEQ/L (ref 3.6–5.0)
Sodium: 138 MEQ/L (ref 136–143)
Sodium: 141 MEQ/L (ref 136–143)

## 2006-12-12 LAB — COMPREHENSIVE METABOLIC PANEL
ALT: 46 U/L (ref 9–52)
AST (SGOT): 25 U/L (ref 8–39)
Albumin/Globulin Ratio: 0.9 — ABNORMAL LOW (ref 1.1–1.8)
Albumin: 2.6 G/DL — ABNORMAL LOW (ref 3.7–5.1)
Alkaline Phosphatase: 133 U/L — ABNORMAL HIGH (ref 43–122)
BUN: 24 MG/DL — ABNORMAL HIGH (ref 7–21)
Bilirubin, Total: 0.3 MG/DL (ref 0.2–1.3)
CO2: 25 MEQ/L (ref 22–31)
Calcium: 8 MG/DL — ABNORMAL LOW (ref 8.6–10.2)
Chloride: 109 MEQ/L — ABNORMAL HIGH (ref 98–107)
Creatinine: 0.8 MG/DL (ref 0.5–1.4)
Globulin: 3 G/DL (ref 2.0–3.7)
Glucose: 158 MG/DL — ABNORMAL HIGH (ref 70–105)
Potassium: 4.7 MEQ/L (ref 3.6–5.0)
Protein, Total: 5.6 G/DL — ABNORMAL LOW (ref 6.0–8.0)
Sodium: 140 MEQ/L (ref 136–143)

## 2006-12-12 LAB — CALCIUM IONIZED-CALC. CERNER: Calcium Ionized Calculated: 2 mEQ/L (ref 1.9–2.3)

## 2006-12-13 LAB — HEMOGLOBIN AND HEMATOCRIT, BLOOD
Hematocrit: 33.2 % — ABNORMAL LOW (ref 37.0–47.0)
Hematocrit: 32.5 % — ABNORMAL LOW (ref 37.0–47.0)
Hematocrit: 31.4 % — ABNORMAL LOW (ref 37.0–47.0)
Hgb: 11.4 G/DL — ABNORMAL LOW (ref 12.0–16.0)
Hgb: 11.2 G/DL — ABNORMAL LOW (ref 12.0–16.0)
Hgb: 10.8 G/DL — ABNORMAL LOW (ref 12.0–16.0)

## 2006-12-13 LAB — CBC WITH AUTO DIFFERENTIAL CERNER
Basophils Absolute: 0 /mm3 (ref 0.0–0.2)
Basophils: 1 % (ref 0–2)
Eosinophils Absolute: 0.2 /mm3 (ref 0.0–0.7)
Eosinophils: 3 % (ref 0–5)
Granulocytes Absolute: 6.5 /mm3 (ref 1.8–8.1)
Hematocrit: 35.3 % — ABNORMAL LOW (ref 37.0–47.0)
Hgb: 12.3 G/DL (ref 12.0–16.0)
Lymphocytes Absolute: 1.3 /mm3 (ref 0.5–4.4)
Lymphocytes: 15 % (ref 15–41)
MCH: 30.8 PG (ref 28.0–32.0)
MCHC: 34.8 G/DL (ref 32.0–36.0)
MCV: 88.4 FL (ref 80.0–100.0)
MPV: 8.8 FL (ref 7.4–10.4)
Monocytes Absolute: 0.8 /mm3 (ref 0.0–1.2)
Monocytes: 10 % (ref 0–11)
Neutrophils %: 72 % (ref 52–75)
Platelets: 157 /mm3 (ref 140–400)
RBC: 4 /mm3 — ABNORMAL LOW (ref 4.20–5.40)
RDW: 14.9 % (ref 11.5–15.0)
WBC: 8.8 /mm3 (ref 3.5–10.8)

## 2006-12-13 LAB — BASIC METABOLIC PANEL
BUN: 18 MG/DL (ref 7–21)
CO2: 24 MEQ/L (ref 22–31)
Calcium: 8 MG/DL — ABNORMAL LOW (ref 8.6–10.2)
Chloride: 112 MEQ/L — ABNORMAL HIGH (ref 98–107)
Creatinine: 0.6 MG/DL (ref 0.5–1.4)
Glucose: 125 MG/DL — ABNORMAL HIGH (ref 70–105)
Potassium: 3.8 MEQ/L (ref 3.6–5.0)
Sodium: 142 MEQ/L (ref 136–143)

## 2006-12-13 LAB — XM IMMEDIATE SPIN CERNER

## 2006-12-13 LAB — GFR

## 2006-12-14 LAB — CBC WITH AUTO DIFFERENTIAL CERNER
Basophils Absolute: 0 /mm3 (ref 0.0–0.2)
Basophils Absolute: 0 /mm3 (ref 0.0–0.2)
Basophils Absolute: 0 /mm3 (ref 0.0–0.2)
Basophils: 0 % (ref 0–2)
Basophils: 0 % (ref 0–2)
Eosinophils Absolute: 0.2 /mm3 (ref 0.0–0.7)
Eosinophils Absolute: 0 /mm3 (ref 0.0–0.7)
Eosinophils Absolute: 0 /mm3 (ref 0.0–0.7)
Eosinophils: 3 % (ref 0–5)
Eosinophils: 0 % (ref 0–5)
Granulocytes Absolute: 4.9 /mm3 (ref 1.8–8.1)
Granulocytes Absolute: 8.1 /mm3 (ref 1.8–8.1)
Granulocytes Absolute: 10.6 /mm3 — ABNORMAL HIGH (ref 1.8–8.1)
Hematocrit: 31.8 % — ABNORMAL LOW (ref 37.0–47.0)
Hematocrit: 34.1 % — ABNORMAL LOW (ref 37.0–47.0)
Hematocrit: 34.1 % — ABNORMAL LOW (ref 37.0–47.0)
Hgb: 11 G/DL — ABNORMAL LOW (ref 12.0–16.0)
Hgb: 11.7 G/DL — ABNORMAL LOW (ref 12.0–16.0)
Hgb: 12 G/DL (ref 12.0–16.0)
Lymphocytes Absolute: 1.3 /mm3 (ref 0.5–4.4)
Lymphocytes Absolute: 0.4 /mm3 — ABNORMAL LOW (ref 0.5–4.4)
Lymphocytes Absolute: 0.3 /mm3 — ABNORMAL LOW (ref 0.5–4.4)
Lymphocytes: 17 % (ref 15–41)
Lymphocytes: 5 % — ABNORMAL LOW (ref 15–41)
MCH: 30.6 PG (ref 28.0–32.0)
MCH: 30.3 PG (ref 28.0–32.0)
MCH: 30.8 PG (ref 28.0–32.0)
MCHC: 34.5 G/DL (ref 32.0–36.0)
MCHC: 34.2 G/DL (ref 32.0–36.0)
MCHC: 35.3 G/DL (ref 32.0–36.0)
MCV: 88.9 FL (ref 80.0–100.0)
MCV: 88.7 FL (ref 80.0–100.0)
MCV: 87.2 FL (ref 80.0–100.0)
MPV: 9.1 FL (ref 7.4–10.4)
MPV: 8.9 FL (ref 7.4–10.4)
MPV: 8.7 FL (ref 7.4–10.4)
Monocytes Absolute: 0.9 /mm3 (ref 0.0–1.2)
Monocytes Absolute: 0 /mm3 (ref 0.0–1.2)
Monocytes Absolute: 0.1 /mm3 (ref 0.0–1.2)
Monocytes: 13 % — ABNORMAL HIGH (ref 0–11)
Monocytes: 0 % (ref 0–11)
Neutrophils %: 67 % (ref 52–75)
Neutrophils %: 95 % — ABNORMAL HIGH (ref 52–75)
Platelets: 179 /mm3 (ref 140–400)
Platelets: 188 /mm3 (ref 140–400)
Platelets: 194 /mm3 (ref 140–400)
RBC: 3.58 /mm3 — ABNORMAL LOW (ref 4.20–5.40)
RBC: 3.85 /mm3 — ABNORMAL LOW (ref 4.20–5.40)
RBC: 3.91 /mm3 — ABNORMAL LOW (ref 4.20–5.40)
RDW: 16 % — ABNORMAL HIGH (ref 11.5–15.0)
RDW: 15.8 % — ABNORMAL HIGH (ref 11.5–15.0)
RDW: 14.7 % (ref 11.5–15.0)
WBC: 7.3 /mm3 (ref 3.5–10.8)
WBC: 8.5 /mm3 (ref 3.5–10.8)
WBC: 10.9 /mm3 — ABNORMAL HIGH (ref 3.5–10.8)

## 2006-12-14 LAB — MANUAL DIFFERENTIAL CERNER
Bands: 24 % — ABNORMAL HIGH (ref 0–9)
Bands: 26 % — ABNORMAL HIGH (ref 0–9)
Eosinophils %: 1 % (ref 0–5)
Lymphocytes Manual: 7 % — ABNORMAL LOW (ref 15–41)
Lymphocytes Manual: 6 % — ABNORMAL LOW (ref 15–41)
Metamyelocytes: 1 % — ABNORMAL HIGH (ref 0–0)
Monocytes Manual: 1 % (ref 0–8)
Monocytes Manual: 1 % (ref 0–8)
Neutrophils %: 66 % (ref 52–75)
Neutrophils %: 67 % (ref 52–75)
RBC Morphology: NORMAL
RBC Morphology: NORMAL

## 2006-12-14 LAB — BASIC METABOLIC PANEL
BUN: 9 MG/DL (ref 7–21)
BUN: 9 MG/DL (ref 7–21)
CO2: 24 MEQ/L (ref 22–31)
CO2: 24 MEQ/L (ref 22–31)
Calcium: 7.7 MG/DL — ABNORMAL LOW (ref 8.6–10.2)
Calcium: 7.9 MG/DL — ABNORMAL LOW (ref 8.6–10.2)
Chloride: 112 MEQ/L — ABNORMAL HIGH (ref 98–107)
Chloride: 108 MEQ/L — ABNORMAL HIGH (ref 98–107)
Creatinine: 0.7 MG/DL (ref 0.5–1.4)
Creatinine: 0.7 MG/DL (ref 0.5–1.4)
Glucose: 163 MG/DL — ABNORMAL HIGH (ref 70–105)
Glucose: 105 MG/DL (ref 70–105)
Potassium: 3.5 MEQ/L — ABNORMAL LOW (ref 3.6–5.0)
Potassium: 3.6 MEQ/L (ref 3.6–5.0)
Sodium: 140 MEQ/L (ref 136–143)
Sodium: 140 MEQ/L (ref 136–143)

## 2006-12-14 LAB — HEMOGLOBIN AND HEMATOCRIT, BLOOD
Hematocrit: 30.6 % — ABNORMAL LOW (ref 37.0–47.0)
Hematocrit: 34.8 % — ABNORMAL LOW (ref 37.0–47.0)
Hgb: 10.4 G/DL — ABNORMAL LOW (ref 12.0–16.0)
Hgb: 11.8 G/DL — ABNORMAL LOW (ref 12.0–16.0)

## 2006-12-14 LAB — GFR

## 2006-12-15 LAB — CBC WITH AUTO DIFFERENTIAL CERNER
Basophils Absolute: 0 /mm3 (ref 0.0–0.2)
Basophils: 0 % (ref 0–2)
Eosinophils Absolute: 0 /mm3 (ref 0.0–0.7)
Eosinophils: 0 % (ref 0–5)
Granulocytes Absolute: 23.3 /mm3 — ABNORMAL HIGH (ref 1.8–8.1)
Hematocrit: 33 % — ABNORMAL LOW (ref 37.0–47.0)
Hgb: 11.1 G/DL — ABNORMAL LOW (ref 12.0–16.0)
Lymphocytes Absolute: 0.4 /mm3 — ABNORMAL LOW (ref 0.5–4.4)
Lymphocytes: 2 % — ABNORMAL LOW (ref 15–41)
MCH: 30.4 PG (ref 28.0–32.0)
MCHC: 33.6 G/DL (ref 32.0–36.0)
MCV: 90.5 FL (ref 80.0–100.0)
MPV: 9.5 FL (ref 7.4–10.4)
Monocytes Absolute: 0.7 /mm3 (ref 0.0–1.2)
Monocytes: 3 % (ref 0–11)
Neutrophils %: 96 % — ABNORMAL HIGH (ref 52–75)
Platelets: 165 /mm3 (ref 140–400)
RBC: 3.65 /mm3 — ABNORMAL LOW (ref 4.20–5.40)
RDW: 16.4 % — ABNORMAL HIGH (ref 11.5–15.0)
WBC: 24.5 /mm3 — ABNORMAL HIGH (ref 3.5–10.8)

## 2006-12-15 LAB — HEMOGLOBIN AND HEMATOCRIT, BLOOD
Hematocrit: 31.9 % — ABNORMAL LOW (ref 37.0–47.0)
Hematocrit: 31.2 % — ABNORMAL LOW (ref 37.0–47.0)
Hematocrit: 36.6 % — ABNORMAL LOW (ref 37.0–47.0)
Hematocrit: 32.7 % — ABNORMAL LOW (ref 37.0–47.0)
Hgb: 11.2 G/DL — ABNORMAL LOW (ref 12.0–16.0)
Hgb: 10.6 G/DL — ABNORMAL LOW (ref 12.0–16.0)
Hgb: 12.4 G/DL (ref 12.0–16.0)
Hgb: 11.1 G/DL — ABNORMAL LOW (ref 12.0–16.0)

## 2006-12-15 LAB — LIPASE: Lipase: 21 U/L — ABNORMAL LOW (ref 23–300)

## 2006-12-15 LAB — AMYLASE: Amylase: 22 U/L — ABNORMAL LOW (ref 30–110)

## 2006-12-15 LAB — HEPATIC FUNCTION PANEL
ALT: 431 U/L — ABNORMAL HIGH (ref 9–52)
AST (SGOT): 557 U/L — ABNORMAL HIGH (ref 8–39)
Albumin/Globulin Ratio: 0.8 — ABNORMAL LOW (ref 1.1–1.8)
Albumin: 2.6 G/DL — ABNORMAL LOW (ref 3.7–5.1)
Alkaline Phosphatase: 319 U/L — ABNORMAL HIGH (ref 43–122)
Bilirubin Direct: 3.1 MG/DL — ABNORMAL HIGH (ref 0.0–0.3)
Bilirubin Indirect: 0.3 MG/DL (ref 0.0–1.1)
Bilirubin, Total: 3.4 MG/DL — ABNORMAL HIGH (ref 0.2–1.3)
Globulin: 3.2 G/DL (ref 2.0–3.7)
Protein, Total: 5.8 G/DL — ABNORMAL LOW (ref 6.0–8.0)

## 2006-12-15 LAB — BASIC METABOLIC PANEL
BUN: 14 MG/DL (ref 7–21)
CO2: 21 MEQ/L — ABNORMAL LOW (ref 22–31)
Calcium: 7.1 MG/DL — ABNORMAL LOW (ref 8.6–10.2)
Chloride: 105 MEQ/L (ref 98–107)
Creatinine: 1.1 MG/DL (ref 0.5–1.4)
Glucose: 229 MG/DL — ABNORMAL HIGH (ref 70–105)
Potassium: 3 MEQ/L — ABNORMAL LOW (ref 3.6–5.0)
Sodium: 137 MEQ/L (ref 136–143)

## 2006-12-15 LAB — GFR

## 2006-12-15 LAB — PT AND APTT
PT INR: 2.2 {INR} — ABNORMAL HIGH (ref 0.9–1.1)
PT INR: 1.7 {INR} — ABNORMAL HIGH (ref 0.9–1.1)
PT: 23.1 s — ABNORMAL HIGH (ref 10.8–13.3)
PT: 18.1 s — ABNORMAL HIGH (ref 10.8–13.3)
PTT: 39 s — ABNORMAL HIGH (ref 21–32)
PTT: 29 s (ref 21–32)

## 2006-12-15 LAB — MAGNESIUM: Magnesium: 0.8 MG/DL — CR (ref 1.6–2.3)

## 2006-12-16 LAB — CBC- CERNER
Hematocrit: 30.3 % — ABNORMAL LOW (ref 37.0–47.0)
Hgb: 10.6 G/DL — ABNORMAL LOW (ref 12.0–16.0)
MCH: 31.1 PG (ref 28.0–32.0)
MCHC: 35 G/DL (ref 32.0–36.0)
MCV: 88.8 FL (ref 80.0–100.0)
MPV: 9.1 FL (ref 7.4–10.4)
Platelets: 173 /mm3 (ref 140–400)
RBC: 3.42 /mm3 — ABNORMAL LOW (ref 4.20–5.40)
RDW: 15.6 % — ABNORMAL HIGH (ref 11.5–15.0)
WBC: 23.1 /mm3 — ABNORMAL HIGH (ref 3.5–10.8)

## 2006-12-16 LAB — COMPREHENSIVE METABOLIC PANEL
ALT: 276 U/L — ABNORMAL HIGH (ref 9–52)
AST (SGOT): 353 U/L — ABNORMAL HIGH (ref 8–39)
Albumin/Globulin Ratio: 0.8 — ABNORMAL LOW (ref 1.1–1.8)
Albumin: 2.4 G/DL — ABNORMAL LOW (ref 3.7–5.1)
Alkaline Phosphatase: 235 U/L — ABNORMAL HIGH (ref 43–122)
BUN: 17 MG/DL (ref 7–21)
Bilirubin, Total: 3.1 MG/DL — ABNORMAL HIGH (ref 0.2–1.3)
CO2: 19 MEQ/L — ABNORMAL LOW (ref 22–31)
Calcium: 7.6 MG/DL — ABNORMAL LOW (ref 8.6–10.2)
Chloride: 109 MEQ/L — ABNORMAL HIGH (ref 98–107)
Creatinine: 0.9 MG/DL (ref 0.5–1.4)
Globulin: 3.2 G/DL (ref 2.0–3.7)
Glucose: 235 MG/DL — ABNORMAL HIGH (ref 70–105)
Potassium: 4.8 MEQ/L (ref 3.6–5.0)
Protein, Total: 5.6 G/DL — ABNORMAL LOW (ref 6.0–8.0)
Sodium: 136 MEQ/L (ref 136–143)

## 2006-12-16 LAB — PT/INR
PT INR: 1.8 {INR} — ABNORMAL HIGH (ref 0.9–1.1)
PT: 19.6 s — ABNORMAL HIGH (ref 10.8–13.3)

## 2006-12-16 LAB — CALCIUM IONIZED-CALC. CERNER: Calcium Ionized Calculated: 1.9 mEQ/L (ref 1.9–2.3)

## 2006-12-16 LAB — BILIRUBIN, DIRECT: Bilirubin Direct: 2.7 MG/DL — ABNORMAL HIGH (ref 0.0–0.3)

## 2006-12-16 LAB — HEMOGLOBIN AND HEMATOCRIT, BLOOD
Hematocrit: 33 % — ABNORMAL LOW (ref 37.0–47.0)
Hematocrit: 29.6 % — ABNORMAL LOW (ref 37.0–47.0)
Hgb: 11.2 G/DL — ABNORMAL LOW (ref 12.0–16.0)
Hgb: 10.2 G/DL — ABNORMAL LOW (ref 12.0–16.0)

## 2006-12-17 LAB — HEMOGLOBIN AND HEMATOCRIT, BLOOD
Hematocrit: 32.7 % — ABNORMAL LOW (ref 37.0–47.0)
Hematocrit: 32.2 % — ABNORMAL LOW (ref 37.0–47.0)
Hematocrit: 31.2 % — ABNORMAL LOW (ref 37.0–47.0)
Hgb: 11.2 G/DL — ABNORMAL LOW (ref 12.0–16.0)
Hgb: 10.9 G/DL — ABNORMAL LOW (ref 12.0–16.0)
Hgb: 10.6 G/DL — ABNORMAL LOW (ref 12.0–16.0)

## 2006-12-17 LAB — CBC WITH AUTO DIFFERENTIAL CERNER
Hematocrit: 31.6 % — ABNORMAL LOW (ref 37.0–47.0)
Hgb: 11.3 G/DL — ABNORMAL LOW (ref 12.0–16.0)
MCH: 31.3 PG (ref 28.0–32.0)
MCHC: 35.7 G/DL (ref 32.0–36.0)
MCV: 87.7 FL (ref 80.0–100.0)
MPV: 9.3 FL (ref 7.4–10.4)
Platelets: 195 /mm3 (ref 140–400)
RBC: 3.6 /mm3 — ABNORMAL LOW (ref 4.20–5.40)
RDW: 15.8 % — ABNORMAL HIGH (ref 11.5–15.0)
WBC: 26.4 /mm3 — ABNORMAL HIGH (ref 3.5–10.8)

## 2006-12-17 LAB — HEPATIC FUNCTION PANEL
ALT: 217 U/L — ABNORMAL HIGH (ref 9–52)
AST (SGOT): 188 U/L — ABNORMAL HIGH (ref 8–39)
Albumin/Globulin Ratio: 0.8 — ABNORMAL LOW (ref 1.1–1.8)
Albumin: 2.6 G/DL — ABNORMAL LOW (ref 3.7–5.1)
Alkaline Phosphatase: 247 U/L — ABNORMAL HIGH (ref 43–122)
Bilirubin Direct: 0.7 MG/DL — ABNORMAL HIGH (ref 0.0–0.3)
Bilirubin Indirect: 0.3 MG/DL (ref 0.0–1.1)
Bilirubin, Total: 1 MG/DL (ref 0.2–1.3)
Globulin: 3.4 G/DL (ref 2.0–3.7)
Protein, Total: 6 G/DL (ref 6.0–8.0)

## 2006-12-18 LAB — COMPREHENSIVE METABOLIC PANEL
ALT: 144 U/L — ABNORMAL HIGH (ref 9–52)
AST (SGOT): 79 U/L — ABNORMAL HIGH (ref 8–39)
Albumin/Globulin Ratio: 0.7 — ABNORMAL LOW (ref 1.1–1.8)
Albumin: 2.2 G/DL — ABNORMAL LOW (ref 3.7–5.1)
Alkaline Phosphatase: 209 U/L — ABNORMAL HIGH (ref 43–122)
BUN: 22 MG/DL — ABNORMAL HIGH (ref 7–21)
Bilirubin, Total: 0.6 MG/DL (ref 0.2–1.3)
CO2: 23 MEQ/L (ref 22–31)
Calcium: 8.2 MG/DL — ABNORMAL LOW (ref 8.6–10.2)
Chloride: 110 MEQ/L — ABNORMAL HIGH (ref 98–107)
Creatinine: 0.8 MG/DL (ref 0.5–1.4)
Globulin: 3.1 G/DL (ref 2.0–3.7)
Glucose: 143 MG/DL — ABNORMAL HIGH (ref 70–105)
Potassium: 3.7 MEQ/L (ref 3.6–5.0)
Protein, Total: 5.3 G/DL — ABNORMAL LOW (ref 6.0–8.0)
Sodium: 141 MEQ/L (ref 136–143)

## 2006-12-18 LAB — CBC WITH MANUAL DIFF- CERNER
Bands: 8 % (ref 0–9)
Hematocrit: 31 % — ABNORMAL LOW (ref 37.0–47.0)
Hgb: 10.7 G/DL — ABNORMAL LOW (ref 12.0–16.0)
Lymphocytes Manual: 2 % — ABNORMAL LOW (ref 15–41)
MCH: 30.3 PG (ref 28.0–32.0)
MCHC: 34.5 G/DL (ref 32.0–36.0)
MCV: 88 FL (ref 80.0–100.0)
MPV: 9.5 FL (ref 7.4–10.4)
Monocytes Manual: 3 % (ref 0–8)
Neutrophils %: 87 % — ABNORMAL HIGH (ref 52–75)
Platelets: 230 /mm3 (ref 140–400)
RBC Morphology: NORMAL
RBC: 3.52 /mm3 — ABNORMAL LOW (ref 4.20–5.40)
RDW: 15.8 % — ABNORMAL HIGH (ref 11.5–15.0)
WBC: 22.9 /mm3 — ABNORMAL HIGH (ref 3.5–10.8)

## 2006-12-18 LAB — HEMOGLOBIN AND HEMATOCRIT, BLOOD
Hematocrit: 35.8 % — ABNORMAL LOW (ref 37.0–47.0)
Hgb: 12.2 G/DL (ref 12.0–16.0)

## 2006-12-18 LAB — CALCIUM IONIZED-CALC. CERNER: Calcium Ionized Calculated: 2.1 mEQ/L (ref 1.9–2.3)

## 2006-12-18 LAB — LIPASE: Lipase: 218 U/L (ref 23–300)

## 2006-12-18 LAB — AMYLASE: Amylase: 65 U/L (ref 30–110)

## 2006-12-18 LAB — GFR

## 2006-12-19 LAB — HEMOGLOBIN AND HEMATOCRIT, BLOOD
Hematocrit: 32.1 % — ABNORMAL LOW (ref 37.0–47.0)
Hematocrit: 32.8 % — ABNORMAL LOW (ref 37.0–47.0)
Hgb: 10.9 G/DL — ABNORMAL LOW (ref 12.0–16.0)
Hgb: 11.2 G/DL — ABNORMAL LOW (ref 12.0–16.0)

## 2006-12-20 LAB — COMPREHENSIVE METABOLIC PANEL
ALT: 72 U/L — ABNORMAL HIGH (ref 9–52)
AST (SGOT): 31 U/L (ref 8–39)
Albumin/Globulin Ratio: 0.6 — ABNORMAL LOW (ref 1.1–1.8)
Albumin: 1.9 G/DL — ABNORMAL LOW (ref 3.7–5.1)
Alkaline Phosphatase: 185 U/L — ABNORMAL HIGH (ref 43–122)
BUN: 21 MG/DL (ref 7–21)
Bilirubin, Total: 0.7 MG/DL (ref 0.2–1.3)
CO2: 25 MEQ/L (ref 22–31)
Calcium: 7 MG/DL — ABNORMAL LOW (ref 8.6–10.2)
Chloride: 103 MEQ/L (ref 98–107)
Creatinine: 1 MG/DL (ref 0.5–1.4)
Globulin: 3 G/DL (ref 2.0–3.7)
Glucose: 207 MG/DL — ABNORMAL HIGH (ref 70–105)
Potassium: 3.1 MEQ/L — ABNORMAL LOW (ref 3.6–5.0)
Protein, Total: 4.9 G/DL — ABNORMAL LOW (ref 6.0–8.0)
Sodium: 136 MEQ/L (ref 136–143)

## 2006-12-20 LAB — CBC- CERNER
Hematocrit: 29.2 % — ABNORMAL LOW (ref 37.0–47.0)
Hgb: 9.9 G/DL — ABNORMAL LOW (ref 12.0–16.0)
MCH: 30 PG (ref 28.0–32.0)
MCHC: 33.8 G/DL (ref 32.0–36.0)
MCV: 88.7 FL (ref 80.0–100.0)
MPV: 9.7 FL (ref 7.4–10.4)
Platelets: 199 /mm3 (ref 140–400)
RBC: 3.29 /mm3 — ABNORMAL LOW (ref 4.20–5.40)
RDW: 16.1 % — ABNORMAL HIGH (ref 11.5–15.0)
WBC: 16.4 /mm3 — ABNORMAL HIGH (ref 3.5–10.8)

## 2006-12-20 LAB — GFR

## 2006-12-20 LAB — HEMOGLOBIN AND HEMATOCRIT, BLOOD
Hematocrit: 30.5 % — ABNORMAL LOW (ref 37.0–47.0)
Hgb: 10.6 G/DL — ABNORMAL LOW (ref 12.0–16.0)

## 2006-12-20 LAB — CALCIUM IONIZED-CALC. CERNER: Calcium Ionized Calculated: 1.9 mEQ/L (ref 1.9–2.3)

## 2006-12-20 LAB — STOOL FOR WBC

## 2006-12-21 LAB — HEMOGLOBIN AND HEMATOCRIT, BLOOD
Hematocrit: 28.4 % — ABNORMAL LOW (ref 37.0–47.0)
Hematocrit: 28.3 % — ABNORMAL LOW (ref 37.0–47.0)
Hgb: 9.7 G/DL — ABNORMAL LOW (ref 12.0–16.0)
Hgb: 9.8 G/DL — ABNORMAL LOW (ref 12.0–16.0)

## 2006-12-22 LAB — COMPREHENSIVE METABOLIC PANEL
ALT: 50 U/L (ref 9–52)
AST (SGOT): 22 U/L (ref 8–39)
Albumin/Globulin Ratio: 0.6 — ABNORMAL LOW (ref 1.1–1.8)
Albumin: 2 G/DL — ABNORMAL LOW (ref 3.7–5.1)
Alkaline Phosphatase: 172 U/L — ABNORMAL HIGH (ref 43–122)
BUN: 13 MG/DL (ref 7–21)
Bilirubin, Total: 0.5 MG/DL (ref 0.2–1.3)
CO2: 27 MEQ/L (ref 22–31)
Calcium: 7.3 MG/DL — ABNORMAL LOW (ref 8.6–10.2)
Chloride: 104 MEQ/L (ref 98–107)
Creatinine: 0.9 MG/DL (ref 0.5–1.4)
Globulin: 3.1 G/DL (ref 2.0–3.7)
Glucose: 134 MG/DL — ABNORMAL HIGH (ref 70–105)
Potassium: 3.7 MEQ/L (ref 3.6–5.0)
Protein, Total: 5.1 G/DL — ABNORMAL LOW (ref 6.0–8.0)
Sodium: 138 MEQ/L (ref 136–143)

## 2006-12-22 LAB — CALCIUM IONIZED-CALC. CERNER: Calcium Ionized Calculated: 1.9 mEQ/L (ref 1.9–2.3)

## 2006-12-22 LAB — CBC- CERNER
Hematocrit: 26.6 % — ABNORMAL LOW (ref 37.0–47.0)
Hgb: 9.1 G/DL — ABNORMAL LOW (ref 12.0–16.0)
MCH: 30.3 PG (ref 28.0–32.0)
MCHC: 34.1 G/DL (ref 32.0–36.0)
MCV: 88.8 FL (ref 80.0–100.0)
MPV: 9.5 FL (ref 7.4–10.4)
Platelets: 299 /mm3 (ref 140–400)
RBC: 3 /mm3 — ABNORMAL LOW (ref 4.20–5.40)
RDW: 16.4 % — ABNORMAL HIGH (ref 11.5–15.0)
WBC: 17.9 /mm3 — ABNORMAL HIGH (ref 3.5–10.8)

## 2006-12-22 LAB — HEMOGLOBIN AND HEMATOCRIT, BLOOD
Hematocrit: 26.3 % — ABNORMAL LOW (ref 37.0–47.0)
Hgb: 9.1 G/DL — ABNORMAL LOW (ref 12.0–16.0)

## 2006-12-22 LAB — GFR

## 2006-12-22 LAB — MAGNESIUM: Magnesium: 1.1 MG/DL — ABNORMAL LOW (ref 1.6–2.3)

## 2006-12-23 LAB — COMPREHENSIVE METABOLIC PANEL
ALT: 43 U/L (ref 9–52)
AST (SGOT): 21 U/L (ref 8–39)
Albumin/Globulin Ratio: 0.6 — ABNORMAL LOW (ref 1.1–1.8)
Albumin: 2 G/DL — ABNORMAL LOW (ref 3.7–5.1)
Alkaline Phosphatase: 166 U/L — ABNORMAL HIGH (ref 43–122)
BUN: 13 MG/DL (ref 7–21)
Bilirubin, Total: 0.4 MG/DL (ref 0.2–1.3)
CO2: 28 MEQ/L (ref 22–31)
Calcium: 7.7 MG/DL — ABNORMAL LOW (ref 8.6–10.2)
Chloride: 105 MEQ/L (ref 98–107)
Creatinine: 0.9 MG/DL (ref 0.5–1.4)
Globulin: 3.2 G/DL (ref 2.0–3.7)
Glucose: 126 MG/DL — ABNORMAL HIGH (ref 70–105)
Potassium: 3.9 MEQ/L (ref 3.6–5.0)
Protein, Total: 5.2 G/DL — ABNORMAL LOW (ref 6.0–8.0)
Sodium: 138 MEQ/L (ref 136–143)

## 2006-12-23 LAB — CBC WITH AUTO DIFFERENTIAL CERNER
Hematocrit: 25.2 % — ABNORMAL LOW (ref 37.0–47.0)
Hgb: 8.6 G/DL — ABNORMAL LOW (ref 12.0–16.0)
MCH: 30.4 PG (ref 28.0–32.0)
MCHC: 34.3 G/DL (ref 32.0–36.0)
MCV: 88.6 FL (ref 80.0–100.0)
MPV: 9.4 FL (ref 7.4–10.4)
Platelets: 299 /mm3 (ref 140–400)
RBC: 2.85 /mm3 — ABNORMAL LOW (ref 4.20–5.40)
RDW: 16.4 % — ABNORMAL HIGH (ref 11.5–15.0)
WBC: 18 /mm3 — ABNORMAL HIGH (ref 3.5–10.8)

## 2006-12-23 LAB — HEMOGLOBIN AND HEMATOCRIT, BLOOD
Hematocrit: 27.4 % — ABNORMAL LOW (ref 37.0–47.0)
Hgb: 9.3 G/DL — ABNORMAL LOW (ref 12.0–16.0)

## 2006-12-23 LAB — CALCIUM IONIZED-CALC. CERNER: Calcium Ionized Calculated: 2 mEQ/L (ref 1.9–2.3)

## 2006-12-24 LAB — BASIC METABOLIC PANEL
BUN: 13 MG/DL (ref 7–21)
CO2: 28 MEQ/L (ref 22–31)
Calcium: 7.8 MG/DL — ABNORMAL LOW (ref 8.6–10.2)
Chloride: 104 MEQ/L (ref 98–107)
Creatinine: 0.8 MG/DL (ref 0.5–1.4)
Glucose: 215 MG/DL — ABNORMAL HIGH (ref 70–105)
Potassium: 4 MEQ/L (ref 3.6–5.0)
Sodium: 138 MEQ/L (ref 136–143)

## 2006-12-24 LAB — CBC- CERNER
Hematocrit: 25.1 % — ABNORMAL LOW (ref 37.0–47.0)
Hgb: 8.6 G/DL — ABNORMAL LOW (ref 12.0–16.0)
MCH: 30.5 PG (ref 28.0–32.0)
MCHC: 34.2 G/DL (ref 32.0–36.0)
MCV: 89.3 FL (ref 80.0–100.0)
MPV: 9.1 FL (ref 7.4–10.4)
Platelets: 303 /mm3 (ref 140–400)
RBC: 2.82 /mm3 — ABNORMAL LOW (ref 4.20–5.40)
RDW: 16.2 % — ABNORMAL HIGH (ref 11.5–15.0)
WBC: 18 /mm3 — ABNORMAL HIGH (ref 3.5–10.8)

## 2006-12-24 LAB — HEMOGLOBIN AND HEMATOCRIT, BLOOD
Hematocrit: 27.5 % — ABNORMAL LOW (ref 37.0–47.0)
Hgb: 9.4 G/DL — ABNORMAL LOW (ref 12.0–16.0)

## 2006-12-24 LAB — GFR

## 2006-12-25 LAB — BASIC METABOLIC PANEL
BUN: 14 MG/DL (ref 7–21)
CO2: 29 MEQ/L (ref 22–31)
Calcium: 8 MG/DL — ABNORMAL LOW (ref 8.6–10.2)
Chloride: 103 MEQ/L (ref 98–107)
Creatinine: 0.9 MG/DL (ref 0.5–1.4)
Glucose: 148 MG/DL — ABNORMAL HIGH (ref 70–105)
Potassium: 4.1 MEQ/L (ref 3.6–5.0)
Sodium: 140 MEQ/L (ref 136–143)

## 2006-12-25 LAB — CBC- CERNER
Hematocrit: 25.4 % — ABNORMAL LOW (ref 37.0–47.0)
Hgb: 8.6 G/DL — ABNORMAL LOW (ref 12.0–16.0)
MCH: 30.5 PG (ref 28.0–32.0)
MCHC: 34 G/DL (ref 32.0–36.0)
MCV: 89.7 FL (ref 80.0–100.0)
MPV: 8.9 FL (ref 7.4–10.4)
Platelets: 336 /mm3 (ref 140–400)
RBC: 2.83 /mm3 — ABNORMAL LOW (ref 4.20–5.40)
RDW: 15.9 % — ABNORMAL HIGH (ref 11.5–15.0)
WBC: 16.5 /mm3 — ABNORMAL HIGH (ref 3.5–10.8)

## 2006-12-25 LAB — CORTISOL PM SOFT: Cortisol PM: 6.8 ug/ml (ref 1.7–14.1)

## 2006-12-25 LAB — HEMOGLOBIN AND HEMATOCRIT, BLOOD
Hematocrit: 27.5 % — ABNORMAL LOW (ref 37.0–47.0)
Hgb: 9.4 G/DL — ABNORMAL LOW (ref 12.0–16.0)

## 2006-12-25 LAB — CORTISOL AM SOFT: Cortisol AM: 2.7 ug/dl — AB (ref 4.5–22.7)

## 2006-12-25 LAB — TSH: TSH: 3.48 u[IU]/mL (ref 0.34–4.82)

## 2006-12-26 LAB — CBC- CERNER
Hematocrit: 27 % — ABNORMAL LOW (ref 37.0–47.0)
Hgb: 9.1 G/DL — ABNORMAL LOW (ref 12.0–16.0)
MCH: 30 PG (ref 28.0–32.0)
MCHC: 33.7 G/DL (ref 32.0–36.0)
MCV: 89.2 FL (ref 80.0–100.0)
MPV: 8.5 FL (ref 7.4–10.4)
Platelets: 378 /mm3 (ref 140–400)
RBC: 3.02 /mm3 — ABNORMAL LOW (ref 4.20–5.40)
RDW: 16.6 % — ABNORMAL HIGH (ref 11.5–15.0)
WBC: 12.9 /mm3 — ABNORMAL HIGH (ref 3.5–10.8)

## 2006-12-26 LAB — PT AND APTT
PT INR: 1.2 {INR} — ABNORMAL HIGH (ref 0.9–1.1)
PT: 13.8 s — ABNORMAL HIGH (ref 10.8–13.3)
PTT: 28 s (ref 21–32)

## 2006-12-26 LAB — HEMOGLOBIN AND HEMATOCRIT, BLOOD
Hematocrit: 24.9 % — ABNORMAL LOW (ref 37.0–47.0)
Hgb: 8.5 G/DL — ABNORMAL LOW (ref 12.0–16.0)

## 2006-12-27 LAB — HEMOGLOBIN AND HEMATOCRIT, BLOOD
Hematocrit: 26.1 % — ABNORMAL LOW (ref 37.0–47.0)
Hematocrit: 27.8 % — ABNORMAL LOW (ref 37.0–47.0)
Hgb: 8.9 G/DL — ABNORMAL LOW (ref 12.0–16.0)
Hgb: 9.4 G/DL — ABNORMAL LOW (ref 12.0–16.0)

## 2006-12-28 LAB — TSH: TSH: 1.64 u[IU]/mL (ref 0.34–4.82)

## 2006-12-28 LAB — T3: T3: 0.68 ng/ml — ABNORMAL LOW (ref 0.97–1.69)

## 2006-12-28 LAB — HEMOGLOBIN AND HEMATOCRIT, BLOOD
Hematocrit: 26.8 % — ABNORMAL LOW (ref 37.0–47.0)
Hematocrit: 26.5 % — ABNORMAL LOW (ref 37.0–47.0)
Hgb: 9.1 G/DL — ABNORMAL LOW (ref 12.0–16.0)
Hgb: 9 G/DL — ABNORMAL LOW (ref 12.0–16.0)

## 2006-12-28 LAB — VITAMIN B12: Vitamin B-12: >1000 PG/ML (ref 239–931)

## 2006-12-28 LAB — CORTISOL AM SOFT
Cortisol AM: 2.8 ug/dl — AB (ref 4.5–22.7)
Cortisol AM: 10 ug/dl (ref 4.5–22.7)
Cortisol AM: 10.3 ug/dl (ref 4.5–22.7)

## 2006-12-28 LAB — T4, FREE: T4 Free: 0.69 NG/DL (ref 0.66–1.81)

## 2006-12-28 LAB — FOLATE: Folate: 9.5 ng/dL (ref 2.8–20.0)

## 2006-12-28 LAB — GLYCO HEMOGLOBIN A1C CERNER: HgA1C Calc: 7.1 % — ABNORMAL HIGH (ref 4.8–6.0)

## 2006-12-30 LAB — CBC- CERNER
Hematocrit: 26.5 % — ABNORMAL LOW (ref 37.0–47.0)
Hgb: 9 G/DL — ABNORMAL LOW (ref 12.0–16.0)
MCH: 30.5 PG (ref 28.0–32.0)
MCHC: 33.9 G/DL (ref 32.0–36.0)
MCV: 89.8 FL (ref 80.0–100.0)
MPV: 8.7 FL (ref 7.4–10.4)
Platelets: 441 /mm3 — ABNORMAL HIGH (ref 140–400)
RBC: 2.95 /mm3 — ABNORMAL LOW (ref 4.20–5.40)
RDW: 16.6 % — ABNORMAL HIGH (ref 11.5–15.0)
WBC: 11.3 /mm3 — ABNORMAL HIGH (ref 3.5–10.8)

## 2006-12-30 LAB — BASIC METABOLIC PANEL
BUN: 24 MG/DL — ABNORMAL HIGH (ref 7–21)
CO2: 26 MEQ/L (ref 22–31)
Calcium: 8.7 MG/DL (ref 8.6–10.2)
Chloride: 107 MEQ/L (ref 98–107)
Creatinine: 0.9 MG/DL (ref 0.5–1.4)
Glucose: 96 MG/DL (ref 70–105)
Potassium: 4.3 MEQ/L (ref 3.6–5.0)
Sodium: 141 MEQ/L (ref 136–143)

## 2006-12-30 LAB — GFR

## 2006-12-31 LAB — CBC- CERNER
Hematocrit: 28.4 % — ABNORMAL LOW (ref 37.0–47.0)
Hgb: 9.5 G/DL — ABNORMAL LOW (ref 12.0–16.0)
MCH: 30.3 PG (ref 28.0–32.0)
MCHC: 33.7 G/DL (ref 32.0–36.0)
MCV: 90 FL (ref 80.0–100.0)
MPV: 8.7 FL (ref 7.4–10.4)
Platelets: 404 /mm3 — ABNORMAL HIGH (ref 140–400)
RBC: 3.15 /mm3 — ABNORMAL LOW (ref 4.20–5.40)
RDW: 16.6 % — ABNORMAL HIGH (ref 11.5–15.0)
WBC: 9.6 /mm3 (ref 3.5–10.8)

## 2006-12-31 LAB — BASIC METABOLIC PANEL
BUN: 26 MG/DL — ABNORMAL HIGH (ref 7–21)
CO2: 26 MEQ/L (ref 22–31)
Calcium: 8.9 MG/DL (ref 8.6–10.2)
Chloride: 106 MEQ/L (ref 98–107)
Creatinine: 0.9 MG/DL (ref 0.5–1.4)
Glucose: 206 MG/DL — ABNORMAL HIGH (ref 70–105)
Potassium: 4.4 MEQ/L (ref 3.6–5.0)
Sodium: 141 MEQ/L (ref 136–143)

## 2006-12-31 LAB — ADRENOCORTICOTROPIC HORMONE SOFT

## 2007-01-01 LAB — HEMOGLOBIN AND HEMATOCRIT, BLOOD
Hematocrit: 29.2 % — ABNORMAL LOW (ref 37.0–47.0)
Hgb: 9.9 G/DL — ABNORMAL LOW (ref 12.0–16.0)

## 2007-01-01 LAB — ANTI-THYROID AB GROUP CERNER

## 2007-01-01 LAB — ALDOSTERONE LEVEL CERNER

## 2007-01-01 LAB — C-PEPTIDE CERNER

## 2007-01-02 LAB — HEPATIC FUNCTION PANEL
ALT: 53 U/L — ABNORMAL HIGH (ref 9–52)
AST (SGOT): 59 U/L — ABNORMAL HIGH (ref 8–39)
Albumin/Globulin Ratio: 0.7 — ABNORMAL LOW (ref 1.1–1.8)
Albumin: 2.4 G/DL — ABNORMAL LOW (ref 3.7–5.1)
Alkaline Phosphatase: 159 U/L — ABNORMAL HIGH (ref 43–122)
Bilirubin Direct: 0.1 MG/DL (ref 0.0–0.3)
Bilirubin Indirect: 0.3 MG/DL (ref 0.0–1.1)
Bilirubin, Total: 0.4 MG/DL (ref 0.2–1.3)
Globulin: 3.6 G/DL (ref 2.0–3.7)
Protein, Total: 6 G/DL (ref 6.0–8.0)

## 2007-01-02 LAB — BASIC METABOLIC PANEL
BUN: 21 MG/DL (ref 7–21)
CO2: 25 MEQ/L (ref 22–31)
Calcium: 8.3 MG/DL — ABNORMAL LOW (ref 8.6–10.2)
Chloride: 104 MEQ/L (ref 98–107)
Creatinine: 0.9 MG/DL (ref 0.5–1.4)
Glucose: 175 MG/DL — ABNORMAL HIGH (ref 70–105)
Potassium: 4.3 MEQ/L (ref 3.6–5.0)
Sodium: 138 MEQ/L (ref 136–143)

## 2007-01-02 LAB — GFR

## 2007-01-02 LAB — CBC- CERNER
Hematocrit: 27 % — ABNORMAL LOW (ref 37.0–47.0)
Hgb: 9.1 G/DL — ABNORMAL LOW (ref 12.0–16.0)
MCH: 30.4 PG (ref 28.0–32.0)
MCHC: 33.5 G/DL (ref 32.0–36.0)
MCV: 90.6 FL (ref 80.0–100.0)
MPV: 8.8 FL (ref 7.4–10.4)
Platelets: 327 /mm3 (ref 140–400)
RBC: 2.98 /mm3 — ABNORMAL LOW (ref 4.20–5.40)
RDW: 17 % — ABNORMAL HIGH (ref 11.5–15.0)
WBC: 13.3 /mm3 — ABNORMAL HIGH (ref 3.5–10.8)

## 2007-01-03 LAB — HEMOGLOBIN AND HEMATOCRIT, BLOOD
Hematocrit: 27.2 % — ABNORMAL LOW (ref 37.0–47.0)
Hgb: 9.2 G/DL — ABNORMAL LOW (ref 12.0–16.0)

## 2007-01-04 LAB — BASIC METABOLIC PANEL
BUN: 16 MG/DL (ref 7–21)
CO2: 29 MEQ/L (ref 22–31)
Calcium: 8.4 MG/DL — ABNORMAL LOW (ref 8.6–10.2)
Chloride: 105 MEQ/L (ref 98–107)
Creatinine: 0.9 MG/DL (ref 0.5–1.4)
Glucose: 172 MG/DL — ABNORMAL HIGH (ref 70–105)
Potassium: 4.3 MEQ/L (ref 3.6–5.0)
Sodium: 139 MEQ/L (ref 136–143)

## 2007-01-04 LAB — HEPATIC FUNCTION PANEL
ALT: 44 U/L (ref 9–52)
AST (SGOT): 44 U/L — ABNORMAL HIGH (ref 8–39)
Albumin/Globulin Ratio: 0.7 — ABNORMAL LOW (ref 1.1–1.8)
Albumin: 2.3 G/DL — ABNORMAL LOW (ref 3.7–5.1)
Alkaline Phosphatase: 217 U/L — ABNORMAL HIGH (ref 43–122)
Bilirubin Direct: 0.2 MG/DL (ref 0.0–0.3)
Bilirubin Indirect: 0.2 MG/DL (ref 0.0–1.1)
Bilirubin, Total: 0.4 MG/DL (ref 0.2–1.3)
Globulin: 3.4 G/DL (ref 2.0–3.7)
Protein, Total: 5.7 G/DL — ABNORMAL LOW (ref 6.0–8.0)

## 2007-01-04 LAB — CBC- CERNER
Hematocrit: 25.4 % — ABNORMAL LOW (ref 37.0–47.0)
Hgb: 8.5 G/DL — ABNORMAL LOW (ref 12.0–16.0)
MCH: 30.5 PG (ref 28.0–32.0)
MCHC: 33.6 G/DL (ref 32.0–36.0)
MCV: 90.7 FL (ref 80.0–100.0)
MPV: 8.8 FL (ref 7.4–10.4)
Platelets: 321 /mm3 (ref 140–400)
RBC: 2.8 /mm3 — ABNORMAL LOW (ref 4.20–5.40)
RDW: 17.2 % — ABNORMAL HIGH (ref 11.5–15.0)
WBC: 12.8 /mm3 — ABNORMAL HIGH (ref 3.5–10.8)

## 2007-01-04 LAB — GFR

## 2007-01-05 LAB — CBC- CERNER
Hematocrit: 25.5 % — ABNORMAL LOW (ref 37.0–47.0)
Hgb: 8.6 G/DL — ABNORMAL LOW (ref 12.0–16.0)
MCH: 30.6 PG (ref 28.0–32.0)
MCHC: 33.9 G/DL (ref 32.0–36.0)
MCV: 90.2 FL (ref 80.0–100.0)
MPV: 8.4 FL (ref 7.4–10.4)
Platelets: 323 /mm3 (ref 140–400)
RBC: 2.82 /mm3 — ABNORMAL LOW (ref 4.20–5.40)
RDW: 17 % — ABNORMAL HIGH (ref 11.5–15.0)
WBC: 11 /mm3 — ABNORMAL HIGH (ref 3.5–10.8)

## 2007-01-05 LAB — HEPATIC FUNCTION PANEL
ALT: 49 U/L (ref 9–52)
AST (SGOT): 72 U/L — ABNORMAL HIGH (ref 8–39)
Albumin/Globulin Ratio: 0.7 — ABNORMAL LOW (ref 1.1–1.8)
Albumin: 2.4 G/DL — ABNORMAL LOW (ref 3.7–5.1)
Alkaline Phosphatase: 327 U/L — ABNORMAL HIGH (ref 43–122)
Bilirubin Direct: 0.4 MG/DL — ABNORMAL HIGH (ref 0.0–0.3)
Bilirubin Indirect: 0.2 MG/DL (ref 0.0–1.1)
Bilirubin, Total: 0.5 MG/DL (ref 0.2–1.3)
Globulin: 3.3 G/DL (ref 2.0–3.7)
Protein, Total: 5.7 G/DL — ABNORMAL LOW (ref 6.0–8.0)

## 2007-01-06 LAB — CBC WITH MANUAL DIFF- CERNER
Bands: 1 % (ref 0–9)
Eosinophils %: 4 % (ref 0–5)
Hematocrit: 27.8 % — ABNORMAL LOW (ref 37.0–47.0)
Hgb: 9.4 G/DL — ABNORMAL LOW (ref 12.0–16.0)
Lymphocytes Manual: 11 % — ABNORMAL LOW (ref 15–41)
MCH: 31 PG (ref 28.0–32.0)
MCHC: 33.9 G/DL (ref 32.0–36.0)
MCV: 91.2 FL (ref 80.0–100.0)
MPV: 8.9 FL (ref 7.4–10.4)
Monocytes Manual: 8 % (ref 0–8)
Neutrophils %: 76 % — ABNORMAL HIGH (ref 52–75)
Platelets: 312 /mm3 (ref 140–400)
RBC Morphology: ABNORMAL
RBC: 3.05 /mm3 — ABNORMAL LOW (ref 4.20–5.40)
RDW: 17 % — ABNORMAL HIGH (ref 11.5–15.0)
WBC: 10.6 /mm3 (ref 3.5–10.8)

## 2007-01-06 LAB — COMPREHENSIVE METABOLIC PANEL
ALT: 47 U/L (ref 9–52)
AST (SGOT): 33 U/L (ref 8–39)
Albumin/Globulin Ratio: 0.7 — ABNORMAL LOW (ref 1.1–1.8)
Albumin: 2.7 G/DL — ABNORMAL LOW (ref 3.7–5.1)
Alkaline Phosphatase: 306 U/L — ABNORMAL HIGH (ref 43–122)
BUN: 18 MG/DL (ref 7–21)
Bilirubin, Total: 0.3 MG/DL (ref 0.2–1.3)
CO2: 30 MEQ/L (ref 22–31)
Calcium: 8.9 MG/DL (ref 8.6–10.2)
Chloride: 103 MEQ/L (ref 98–107)
Creatinine: 0.8 MG/DL (ref 0.5–1.4)
Globulin: 3.8 G/DL — ABNORMAL HIGH (ref 2.0–3.7)
Glucose: 124 MG/DL — ABNORMAL HIGH (ref 70–105)
Potassium: 4.2 MEQ/L (ref 3.6–5.0)
Protein, Total: 6.5 G/DL (ref 6.0–8.0)
Sodium: 141 MEQ/L (ref 136–143)

## 2007-01-06 LAB — CALCIUM IONIZED-CALC. CERNER: Calcium Ionized Calculated: 2 mEQ/L (ref 1.9–2.3)

## 2007-01-06 LAB — FOLATE: Folate: 15.2 ng/dL (ref 2.8–20.0)

## 2007-01-06 LAB — IRON PROFILE
Iron Saturation: 21 % (ref 15–50)
Iron: 43 UG/DL (ref 35–150)
TIBC: 208 UG/DL — ABNORMAL LOW (ref 250–450)

## 2007-01-06 LAB — RETICULOCYTES: Retic %: 2.6 % — ABNORMAL HIGH (ref <=2.1)

## 2007-01-06 LAB — GFR

## 2007-01-06 LAB — VITAMIN B12: Vitamin B-12: >1000 PG/ML (ref 239–931)

## 2007-01-06 LAB — FERRITIN: Ferritin: 151 ng/mL (ref 11.1–264.0)

## 2007-01-08 LAB — HEPATIC FUNCTION PANEL
ALT: 57 U/L — ABNORMAL HIGH (ref 9–52)
AST (SGOT): 32 U/L (ref 8–39)
Albumin/Globulin Ratio: 0.8 — ABNORMAL LOW (ref 1.1–1.8)
Albumin: 2.7 G/DL — ABNORMAL LOW (ref 3.7–5.1)
Alkaline Phosphatase: 385 U/L — ABNORMAL HIGH (ref 43–122)
Bilirubin Direct: 0.1 MG/DL (ref 0.0–0.3)
Bilirubin Indirect: 0.2 MG/DL (ref 0.0–1.1)
Bilirubin, Total: 0.3 MG/DL (ref 0.2–1.3)
Globulin: 3.6 G/DL (ref 2.0–3.7)
Protein, Total: 6.3 G/DL (ref 6.0–8.0)

## 2007-01-08 LAB — BASIC METABOLIC PANEL
BUN: 19 MG/DL (ref 7–21)
CO2: 29 MEQ/L (ref 22–31)
Calcium: 8.8 MG/DL (ref 8.6–10.2)
Chloride: 104 MEQ/L (ref 98–107)
Creatinine: 0.8 MG/DL (ref 0.5–1.4)
Glucose: 116 MG/DL — ABNORMAL HIGH (ref 70–105)
Potassium: 4.2 MEQ/L (ref 3.6–5.0)
Sodium: 142 MEQ/L (ref 136–143)

## 2007-01-08 LAB — GFR

## 2007-02-03 ENCOUNTER — Inpatient Hospital Stay
Admission: EM | Admit: 2007-02-03 | Disposition: A | Discharge status: Home / Self Care | Payer: Self-pay | Source: Emergency Department | Admitting: Internal Medicine

## 2007-02-03 LAB — COMPREHENSIVE METABOLIC PANEL
ALT: 398 U/L — ABNORMAL HIGH (ref 9–52)
AST (SGOT): 658 U/L — ABNORMAL HIGH (ref 8–39)
Albumin/Globulin Ratio: 0.9 — ABNORMAL LOW (ref 1.1–1.8)
Albumin: 3.4 G/DL — ABNORMAL LOW (ref 3.7–5.1)
Alkaline Phosphatase: 842 U/L — ABNORMAL HIGH (ref 43–122)
BUN: 20 MG/DL (ref 7–21)
Bilirubin, Total: 1 MG/DL (ref 0.2–1.3)
CO2: 26 MEQ/L (ref 22–31)
Calcium: 9.1 MG/DL (ref 8.6–10.2)
Chloride: 103 MEQ/L (ref 98–107)
Creatinine: 0.7 MG/DL (ref 0.5–1.4)
Globulin: 4 G/DL — ABNORMAL HIGH (ref 2.0–3.7)
Glucose: 208 MG/DL — ABNORMAL HIGH (ref 70–105)
Potassium: 4.6 MEQ/L (ref 3.6–5.0)
Protein, Total: 7.4 G/DL (ref 6.0–8.0)
Sodium: 140 MEQ/L (ref 136–143)

## 2007-02-03 LAB — CBC WITH AUTO DIFFERENTIAL CERNER
Basophils Absolute: 0 /mm3 (ref 0.0–0.2)
Basophils: 0 % (ref 0–2)
Eosinophils Absolute: 0.1 /mm3 (ref 0.0–0.7)
Eosinophils: 1 % (ref 0–5)
Granulocytes Absolute: 8.1 /mm3 (ref 1.8–8.1)
Hematocrit: 30.5 % — ABNORMAL LOW (ref 37.0–47.0)
Hgb: 10.4 G/DL — ABNORMAL LOW (ref 12.0–16.0)
Lymphocytes Absolute: 0.6 /mm3 (ref 0.5–4.4)
Lymphocytes: 7 % — ABNORMAL LOW (ref 15–41)
MCH: 31 PG (ref 28.0–32.0)
MCHC: 34 G/DL (ref 32.0–36.0)
MCV: 91.2 FL (ref 80.0–100.0)
MPV: 8.9 FL (ref 7.4–10.4)
Monocytes Absolute: 0.3 /mm3 (ref 0.0–1.2)
Monocytes: 3 % (ref 0–11)
Neutrophils %: 89 % — ABNORMAL HIGH (ref 52–75)
Platelets: 326 /mm3 (ref 140–400)
RBC: 3.34 /mm3 — ABNORMAL LOW (ref 4.20–5.40)
RDW: 16.6 % — ABNORMAL HIGH (ref 11.5–15.0)
WBC: 9.1 /mm3 (ref 3.5–10.8)

## 2007-02-03 LAB — GFR

## 2007-02-03 LAB — AMYLASE: Amylase: 1722 U/L — AB (ref 30–110)

## 2007-02-03 LAB — CALCIUM IONIZED-CALC. CERNER: Calcium Ionized Calculated: 1.9 mEQ/L (ref 1.9–2.3)

## 2007-02-03 LAB — LIPASE: Lipase: 17267 U/L — AB (ref 23–300)

## 2007-02-04 LAB — COMPREHENSIVE METABOLIC PANEL
ALT: 336 U/L — ABNORMAL HIGH (ref 9–52)
AST (SGOT): 495 U/L — ABNORMAL HIGH (ref 8–39)
Albumin/Globulin Ratio: 0.7 — ABNORMAL LOW (ref 1.1–1.8)
Albumin: 2.8 G/DL — ABNORMAL LOW (ref 3.7–5.1)
Alkaline Phosphatase: 797 U/L — ABNORMAL HIGH (ref 43–122)
BUN: 18 MG/DL (ref 7–21)
Bilirubin, Total: 1.5 MG/DL — ABNORMAL HIGH (ref 0.2–1.3)
CO2: 26 MEQ/L (ref 22–31)
Calcium: 8.6 MG/DL (ref 8.6–10.2)
Chloride: 106 MEQ/L (ref 98–107)
Creatinine: 1 MG/DL (ref 0.5–1.4)
Globulin: 3.8 G/DL — ABNORMAL HIGH (ref 2.0–3.7)
Glucose: 32 MG/DL — CR (ref 70–105)
Potassium: 4.1 MEQ/L (ref 3.6–5.0)
Protein, Total: 6.6 G/DL (ref 6.0–8.0)
Sodium: 143 MEQ/L (ref 136–143)

## 2007-02-04 LAB — CBC WITH AUTO DIFFERENTIAL CERNER
Hematocrit: 28.8 % — ABNORMAL LOW (ref 37.0–47.0)
Hgb: 9.8 G/DL — ABNORMAL LOW (ref 12.0–16.0)
MCH: 31 PG (ref 28.0–32.0)
MCHC: 34 G/DL (ref 32.0–36.0)
MCV: 91.1 FL (ref 80.0–100.0)
MPV: 8.4 FL (ref 7.4–10.4)
Platelets: 300 /mm3 (ref 140–400)
RBC: 3.16 /mm3 — ABNORMAL LOW (ref 4.20–5.40)
RDW: 16.7 % — ABNORMAL HIGH (ref 11.5–15.0)
WBC: 18.1 /mm3 — ABNORMAL HIGH (ref 3.5–10.8)

## 2007-02-04 LAB — LIPASE: Lipase: 7877 U/L — AB (ref 23–300)

## 2007-02-04 LAB — AMYLASE: Amylase: 1599 U/L — AB (ref 30–110)

## 2007-02-04 LAB — CALCIUM IONIZED-CALC. CERNER: Calcium Ionized Calculated: 2 mEQ/L (ref 1.9–2.3)

## 2007-02-05 LAB — URINALYSIS
Blood, UA: NEGATIVE
Glucose, UA: NEGATIVE
Ketones UA: NEGATIVE
Leukocyte Esterase, UA: NEGATIVE
Nitrite, UA: NEGATIVE
Specific Gravity UA POCT: 1.025 (ref <=1.030)
Urine pH: 6 (ref 5.0–8.0)
Urobilinogen, UA: 0.2

## 2007-02-05 LAB — CBC- CERNER
Hematocrit: 28.7 % — ABNORMAL LOW (ref 37.0–47.0)
Hgb: 9.6 G/DL — ABNORMAL LOW (ref 12.0–16.0)
MCH: 30.6 PG (ref 28.0–32.0)
MCHC: 33.3 G/DL (ref 32.0–36.0)
MCV: 91.9 FL (ref 80.0–100.0)
MPV: 8.6 FL (ref 7.4–10.4)
Platelets: 281 /mm3 (ref 140–400)
RBC: 3.12 /mm3 — ABNORMAL LOW (ref 4.20–5.40)
RDW: 17 % — ABNORMAL HIGH (ref 11.5–15.0)
WBC: 9.9 /mm3 (ref 3.5–10.8)

## 2007-02-05 LAB — LIPASE: Lipase: 3165 U/L — AB (ref 23–300)

## 2007-02-05 LAB — COMPREHENSIVE METABOLIC PANEL
ALT: 218 U/L — ABNORMAL HIGH (ref 9–52)
AST (SGOT): 219 U/L — ABNORMAL HIGH (ref 8–39)
Albumin/Globulin Ratio: 0.6 — ABNORMAL LOW (ref 1.1–1.8)
Albumin: 2.3 G/DL — ABNORMAL LOW (ref 3.7–5.1)
Alkaline Phosphatase: 615 U/L — ABNORMAL HIGH (ref 43–122)
BUN: 12 MG/DL (ref 7–21)
Bilirubin, Total: 0.8 MG/DL (ref 0.2–1.3)
CO2: 24 MEQ/L (ref 22–31)
Calcium: 8.1 MG/DL — ABNORMAL LOW (ref 8.6–10.2)
Chloride: 109 MEQ/L — ABNORMAL HIGH (ref 98–107)
Creatinine: 0.8 MG/DL (ref 0.5–1.4)
Globulin: 3.8 G/DL — ABNORMAL HIGH (ref 2.0–3.7)
Glucose: 68 MG/DL — ABNORMAL LOW (ref 70–105)
Potassium: 4.2 MEQ/L (ref 3.6–5.0)
Protein, Total: 6.1 G/DL (ref 6.0–8.0)
Sodium: 142 MEQ/L (ref 136–143)

## 2007-02-05 LAB — URINE ICTOTEST: Urine Ictotest: NEGATIVE

## 2007-02-05 LAB — AMYLASE: Amylase: 461 U/L — ABNORMAL HIGH (ref 30–110)

## 2007-02-05 LAB — URINE MICROSCOPIC

## 2007-02-05 LAB — CALCIUM IONIZED-CALC. CERNER: Calcium Ionized Calculated: 1.9 mEQ/L (ref 1.9–2.3)

## 2007-02-05 LAB — GFR

## 2007-02-05 LAB — PT AND APTT
PT INR: 1.3 {INR} — ABNORMAL HIGH (ref 0.9–1.1)
PT: 15.2 s — ABNORMAL HIGH (ref 10.8–13.3)
PTT: 29 s (ref 21–32)

## 2007-02-06 LAB — HEPATIC FUNCTION PANEL
ALT: 136 U/L — ABNORMAL HIGH (ref 9–52)
AST (SGOT): 99 U/L — ABNORMAL HIGH (ref 8–39)
Albumin/Globulin Ratio: 0.6 — ABNORMAL LOW (ref 1.1–1.8)
Albumin: 1.9 G/DL — ABNORMAL LOW (ref 3.7–5.1)
Alkaline Phosphatase: 481 U/L — ABNORMAL HIGH (ref 43–122)
Bilirubin Direct: 0.2 MG/DL (ref 0.0–0.3)
Bilirubin Indirect: 0.3 MG/DL (ref 0.0–1.1)
Bilirubin, Total: 0.4 MG/DL (ref 0.2–1.3)
Globulin: 3.1 G/DL (ref 2.0–3.7)
Protein, Total: 5 G/DL — ABNORMAL LOW (ref 6.0–8.0)

## 2007-02-06 LAB — AMYLASE: Amylase: 137 U/L — ABNORMAL HIGH (ref 30–110)

## 2007-02-06 LAB — LIPASE: Lipase: 1013 U/L — ABNORMAL HIGH (ref 23–300)

## 2007-02-07 LAB — CBC- CERNER
Hematocrit: 25.6 % — ABNORMAL LOW (ref 37.0–47.0)
Hgb: 8.8 G/DL — ABNORMAL LOW (ref 12.0–16.0)
MCH: 30.9 PG (ref 28.0–32.0)
MCHC: 34.2 G/DL (ref 32.0–36.0)
MCV: 90.6 FL (ref 80.0–100.0)
MPV: 8.7 FL (ref 7.4–10.4)
Platelets: 284 /mm3 (ref 140–400)
RBC: 2.83 /mm3 — ABNORMAL LOW (ref 4.20–5.40)
RDW: 16.5 % — ABNORMAL HIGH (ref 11.5–15.0)
WBC: 5.7 /mm3 (ref 3.5–10.8)

## 2007-02-07 LAB — HEPATIC FUNCTION PANEL
ALT: 106 U/L — ABNORMAL HIGH (ref 9–52)
AST (SGOT): 75 U/L — ABNORMAL HIGH (ref 8–39)
Albumin/Globulin Ratio: 0.6 — ABNORMAL LOW (ref 1.1–1.8)
Albumin: 1.9 G/DL — ABNORMAL LOW (ref 3.7–5.1)
Alkaline Phosphatase: 514 U/L — ABNORMAL HIGH (ref 43–122)
Bilirubin Direct: 0.2 MG/DL (ref 0.0–0.3)
Bilirubin Indirect: 0.3 MG/DL (ref 0.0–1.1)
Bilirubin, Total: 0.5 MG/DL (ref 0.2–1.3)
Globulin: 3.3 G/DL (ref 2.0–3.7)
Protein, Total: 5.2 G/DL — ABNORMAL LOW (ref 6.0–8.0)

## 2007-02-08 LAB — HEPATIC FUNCTION PANEL
ALT: 94 U/L — ABNORMAL HIGH (ref 9–52)
AST (SGOT): 52 U/L — ABNORMAL HIGH (ref 8–39)
Albumin/Globulin Ratio: 0.6 — ABNORMAL LOW (ref 1.1–1.8)
Albumin: 2.2 G/DL — ABNORMAL LOW (ref 3.7–5.1)
Alkaline Phosphatase: 513 U/L — ABNORMAL HIGH (ref 43–122)
Bilirubin Direct: 0.1 MG/DL (ref 0.0–0.3)
Bilirubin Indirect: 0.4 MG/DL (ref 0.0–1.1)
Bilirubin, Total: 0.5 MG/DL (ref 0.2–1.3)
Globulin: 3.5 G/DL (ref 2.0–3.7)
Protein, Total: 5.7 G/DL — ABNORMAL LOW (ref 6.0–8.0)

## 2007-02-08 LAB — CBC- CERNER
Hematocrit: 26.9 % — ABNORMAL LOW (ref 37.0–47.0)
Hgb: 9.2 G/DL — ABNORMAL LOW (ref 12.0–16.0)
MCH: 30.8 PG (ref 28.0–32.0)
MCHC: 34 G/DL (ref 32.0–36.0)
MCV: 90.4 FL (ref 80.0–100.0)
MPV: 8.4 FL (ref 7.4–10.4)
Platelets: 290 /mm3 (ref 140–400)
RBC: 2.98 /mm3 — ABNORMAL LOW (ref 4.20–5.40)
RDW: 16.8 % — ABNORMAL HIGH (ref 11.5–15.0)
WBC: 6.7 /mm3 (ref 3.5–10.8)

## 2007-02-08 LAB — LIPASE: Lipase: 510 U/L — ABNORMAL HIGH (ref 23–300)

## 2007-02-08 LAB — AMYLASE: Amylase: 68 U/L (ref 30–110)

## 2007-02-08 LAB — TSH: TSH: 2.74 u[IU]/mL (ref 0.34–4.82)

## 2007-02-09 LAB — TROPONIN I QUANTITATIVE LEVEL CERNER: Troponin I: 0.01 ng/mL (ref 0.00–0.03)

## 2007-02-09 LAB — LIPASE: Lipase: 703 U/L — ABNORMAL HIGH (ref 23–300)

## 2007-02-10 LAB — HEPATIC FUNCTION PANEL
ALT: 62 U/L — ABNORMAL HIGH (ref 9–52)
AST (SGOT): 31 U/L (ref 8–39)
Albumin/Globulin Ratio: 0.6 — ABNORMAL LOW (ref 1.1–1.8)
Albumin: 2.1 G/DL — ABNORMAL LOW (ref 3.7–5.1)
Alkaline Phosphatase: 394 U/L — ABNORMAL HIGH (ref 43–122)
Bilirubin Direct: 0.1 MG/DL (ref 0.0–0.3)
Bilirubin Indirect: 0.3 MG/DL (ref 0.0–1.1)
Bilirubin, Total: 0.4 MG/DL (ref 0.2–1.3)
Globulin: 3.4 G/DL (ref 2.0–3.7)
Protein, Total: 5.5 G/DL — ABNORMAL LOW (ref 6.0–8.0)

## 2007-02-10 LAB — TROPONIN I QUANTITATIVE LEVEL CERNER
Troponin I: 0.01 ng/mL (ref 0.00–0.03)
Troponin I: 0.02 ng/mL (ref 0.00–0.03)

## 2007-02-10 LAB — LIPASE: Lipase: 762 U/L — ABNORMAL HIGH (ref 23–300)

## 2007-02-11 LAB — CBC- CERNER
Hematocrit: 25.5 % — ABNORMAL LOW (ref 37.0–47.0)
Hgb: 8.7 G/DL — ABNORMAL LOW (ref 12.0–16.0)
MCH: 30.8 PG (ref 28.0–32.0)
MCHC: 33.9 G/DL (ref 32.0–36.0)
MCV: 90.7 FL (ref 80.0–100.0)
MPV: 8.1 FL (ref 7.4–10.4)
Platelets: 289 /mm3 (ref 140–400)
RBC: 2.81 /mm3 — ABNORMAL LOW (ref 4.20–5.40)
RDW: 15.9 % — ABNORMAL HIGH (ref 11.5–15.0)
WBC: 7.4 /mm3 (ref 3.5–10.8)

## 2007-02-11 LAB — COMPREHENSIVE METABOLIC PANEL
ALT: 55 U/L — ABNORMAL HIGH (ref 9–52)
AST (SGOT): 28 U/L (ref 8–39)
Albumin/Globulin Ratio: 0.6 — ABNORMAL LOW (ref 1.1–1.8)
Albumin: 2 G/DL — ABNORMAL LOW (ref 3.7–5.1)
Alkaline Phosphatase: 355 U/L — ABNORMAL HIGH (ref 43–122)
BUN: 4 MG/DL — ABNORMAL LOW (ref 7–21)
Bilirubin, Total: 0.3 MG/DL (ref 0.2–1.3)
CO2: 27 MEQ/L (ref 22–31)
Calcium: 7.5 MG/DL — ABNORMAL LOW (ref 8.6–10.2)
Chloride: 102 MEQ/L (ref 98–107)
Creatinine: 0.7 MG/DL (ref 0.5–1.4)
Globulin: 3.3 G/DL (ref 2.0–3.7)
Glucose: 192 MG/DL — ABNORMAL HIGH (ref 70–105)
Potassium: 3.1 MEQ/L — ABNORMAL LOW (ref 3.6–5.0)
Protein, Total: 5.3 G/DL — ABNORMAL LOW (ref 6.0–8.0)
Sodium: 136 MEQ/L (ref 136–143)

## 2007-02-11 LAB — TROPONIN I QUANTITATIVE LEVEL CERNER: Troponin I: 0.01 ng/mL (ref 0.00–0.03)

## 2007-02-11 LAB — URINALYSIS
Bilirubin, UA: NEGATIVE
Blood, UA: NEGATIVE
Glucose, UA: NEGATIVE
Ketones UA: NEGATIVE
Leukocyte Esterase, UA: NEGATIVE
Nitrite, UA: NEGATIVE
Protein, UR: NEGATIVE
Specific Gravity UA POCT: <=1.005 (ref <=1.030)
Urine pH: 6.5 (ref 5.0–8.0)
Urobilinogen, UA: 0.2

## 2007-02-11 LAB — LIPASE: Lipase: 467 U/L — ABNORMAL HIGH (ref 23–300)

## 2007-02-11 LAB — AMYLASE: Amylase: 60 U/L (ref 30–110)

## 2007-02-11 LAB — CALCIUM IONIZED-CALC. CERNER: Calcium Ionized Calculated: 1.9 mEQ/L (ref 1.9–2.3)

## 2007-02-11 LAB — GFR

## 2007-02-12 LAB — BASIC METABOLIC PANEL
BUN: 4 MG/DL — ABNORMAL LOW (ref 7–21)
CO2: 26 MEQ/L (ref 22–31)
Calcium: 7.6 MG/DL — ABNORMAL LOW (ref 8.6–10.2)
Chloride: 104 MEQ/L (ref 98–107)
Creatinine: 0.6 MG/DL (ref 0.5–1.4)
Glucose: 254 MG/DL — ABNORMAL HIGH (ref 70–105)
Potassium: 3.3 MEQ/L — ABNORMAL LOW (ref 3.6–5.0)
Sodium: 138 MEQ/L (ref 136–143)

## 2007-02-12 LAB — MAGNESIUM: Magnesium: 0.8 MG/DL — CR (ref 1.6–2.3)

## 2007-02-12 LAB — GFR

## 2007-02-13 LAB — CBC AND DIFFERENTIAL
Basophils Absolute: 0.1 /mm3 (ref 0.0–0.2)
Basophils: 1 % (ref 0–2)
Eosinophils Absolute: 0.2 /mm3 (ref 0.0–0.7)
Eosinophils: 3 % (ref 0–5)
Granulocytes Absolute: 4 /mm3 (ref 1.8–8.1)
Hematocrit: 28.6 % — ABNORMAL LOW (ref 37.0–47.0)
Hgb: 9.3 G/DL — ABNORMAL LOW (ref 12.0–16.0)
Immature Granulocytes Absolute: 0.1 CUMM — ABNORMAL HIGH (ref 0.0–0.0)
Immature Granulocytes: 1 % (ref 0–1)
Lymphocytes Absolute: 2.2 /mm3 (ref 0.5–4.4)
Lymphocytes: 31 % (ref 15–41)
MCH: 29.4 PG (ref 28.0–32.0)
MCHC: 32.5 G/DL (ref 32.0–36.0)
MCV: 90.5 FL (ref 80.0–100.0)
MPV: 10.2 FL (ref 7.4–10.4)
Monocytes Absolute: 0.7 /mm3 (ref 0.0–1.2)
Monocytes: 9 % (ref 0–11)
Neutrophils %: 56 % (ref 52–75)
Nucleated RBC: 0 /100{WBCs} (ref 0–0)
Nucleated RBC: 0 /mm3 (ref 0.0–0.0)
Platelets: 299 /mm3 (ref 140–400)
RBC: 3.16 /mm3 — ABNORMAL LOW (ref 4.20–5.40)
RDW: 16.2 % — ABNORMAL HIGH (ref 11.5–15.0)
WBC: 7.14 /mm3 (ref 3.50–10.80)

## 2007-02-13 LAB — BASIC METABOLIC PANEL
BUN: 7 MG/DL (ref 7–21)
CO2: 25 MEQ/L (ref 22–31)
Calcium: 8 MG/DL — ABNORMAL LOW (ref 8.6–10.2)
Chloride: 105 MEQ/L (ref 98–107)
Creatinine: 0.7 MG/DL (ref 0.5–1.4)
Glucose: 164 MG/DL — ABNORMAL HIGH (ref 70–105)
Potassium: 5 MEQ/L (ref 3.6–5.0)
Sodium: 137 MEQ/L (ref 136–143)

## 2007-02-13 LAB — GFR

## 2007-02-13 LAB — MAGNESIUM: Magnesium: 1.3 MG/DL — ABNORMAL LOW (ref 1.6–2.3)

## 2007-02-13 LAB — PT/INR
PT INR: 1.2 {INR} — ABNORMAL HIGH (ref 0.9–1.1)
PT: 13.5 s — ABNORMAL HIGH (ref 10.8–13.3)

## 2007-02-14 LAB — POTASSIUM: Potassium: 4.9 MEQ/L (ref 3.6–5.0)

## 2007-02-15 LAB — MAGNESIUM: Magnesium: 1.2 MG/DL — ABNORMAL LOW (ref 1.6–2.3)

## 2007-02-16 LAB — PT/INR
PT INR: 1.2 {INR} — ABNORMAL HIGH (ref 0.9–1.1)
PT: 13.5 s — ABNORMAL HIGH (ref 10.8–13.3)

## 2007-02-16 LAB — BASIC METABOLIC PANEL
BUN: 9 MG/DL (ref 7–21)
CO2: 22 MEQ/L (ref 22–31)
Calcium: 8.2 MG/DL — ABNORMAL LOW (ref 8.6–10.2)
Chloride: 107 MEQ/L (ref 98–107)
Creatinine: 0.7 MG/DL (ref 0.5–1.4)
Glucose: 289 MG/DL — ABNORMAL HIGH (ref 70–105)
Potassium: 4.8 MEQ/L (ref 3.6–5.0)
Sodium: 137 MEQ/L (ref 136–143)

## 2007-02-16 LAB — MAGNESIUM: Magnesium: 1.3 MG/DL — ABNORMAL LOW (ref 1.6–2.3)

## 2007-02-16 LAB — GFR

## 2007-02-17 LAB — PT/INR
PT INR: 1.1 {INR} (ref 0.9–1.1)
PT: 12.9 s (ref 10.8–13.3)

## 2007-02-17 LAB — BASIC METABOLIC PANEL
BUN: 7 MG/DL (ref 7–21)
CO2: 21 MEQ/L — ABNORMAL LOW (ref 22–31)
Calcium: 8.5 MG/DL — ABNORMAL LOW (ref 8.6–10.2)
Chloride: 107 MEQ/L (ref 98–107)
Creatinine: 0.6 MG/DL (ref 0.5–1.4)
Glucose: 191 MG/DL — ABNORMAL HIGH (ref 70–105)
Potassium: 4.4 MEQ/L (ref 3.6–5.0)
Sodium: 138 MEQ/L (ref 136–143)

## 2007-02-17 LAB — MAGNESIUM: Magnesium: 1.2 MG/DL — ABNORMAL LOW (ref 1.6–2.3)

## 2007-03-13 ENCOUNTER — Emergency Department: Admit: 2007-03-13 | Payer: Self-pay | Source: Emergency Department | Admitting: Emergency Medicine

## 2007-03-13 LAB — CBC AND DIFFERENTIAL
Basophils Absolute: 0.1 /mm3 (ref 0.0–0.2)
Basophils: 0 % (ref 0–2)
Eosinophils Absolute: 0.4 /mm3 (ref 0.0–0.7)
Eosinophils: 3 % (ref 0–5)
Granulocytes Absolute: 9.7 /mm3 — ABNORMAL HIGH (ref 1.8–8.1)
Hematocrit: 30.2 % — ABNORMAL LOW (ref 37.0–47.0)
Hgb: 9.7 G/DL — ABNORMAL LOW (ref 12.0–16.0)
Immature Granulocytes Absolute: 0.1 CUMM — ABNORMAL HIGH (ref 0.0–0.0)
Immature Granulocytes: 0 % (ref 0–1)
Lymphocytes Absolute: 1 /mm3 (ref 0.5–4.4)
Lymphocytes: 9 % — ABNORMAL LOW (ref 15–41)
MCH: 30 PG (ref 28.0–32.0)
MCHC: 32.1 G/DL (ref 32.0–36.0)
MCV: 93.5 FL (ref 80.0–100.0)
MPV: 11 FL — ABNORMAL HIGH (ref 7.4–10.4)
Monocytes Absolute: 0.9 /mm3 (ref 0.0–1.2)
Monocytes: 7 % (ref 0–11)
Neutrophils %: 81 % — ABNORMAL HIGH (ref 52–75)
Platelets: 312 /mm3 (ref 140–400)
RBC: 3.23 /mm3 — ABNORMAL LOW (ref 4.20–5.40)
RDW: 15.1 % — ABNORMAL HIGH (ref 11.5–15.0)
WBC: 12.01 /mm3 — ABNORMAL HIGH (ref 3.50–10.80)

## 2007-03-13 LAB — COMPREHENSIVE METABOLIC PANEL - AH CERNER
ALT: 68 U/L — ABNORMAL HIGH (ref 0–31)
AST (SGOT): 70 U/L — ABNORMAL HIGH (ref 0–31)
Albumin/Globulin Ratio: 0.8 — ABNORMAL LOW (ref 1.1–2.2)
Albumin: 3.2 g/dL — ABNORMAL LOW (ref 3.4–4.8)
Alkaline Phosphatase: 400 U/L — ABNORMAL HIGH (ref 35–104)
Anion Gap: 10 mEq/L (ref 5–15)
BUN: 18 mg/dL (ref 8–23)
Bilirubin, Total: 0.2 mg/dL (ref 0.0–1.0)
CA: 3.8 mEq/L (ref 3.8–4.6)
CO2: 24.9 mEq/L (ref 23.0–32.5)
Calcium: 8.8 mg/dL (ref 8.8–10.2)
Chloride: 103 mEq/L (ref 96–108)
Creatinine: 0.8 mg/dL (ref 0.4–1.1)
Globulin: 3.9 g/dL — ABNORMAL HIGH (ref 2.0–3.6)
Glucose: 298 mg/dL
Osmolality Calculated: 299 mosm/kg — ABNORMAL HIGH (ref 282–298)
Potassium: 4.1 mEq/L (ref 3.3–5.1)
Protein, Total: 7.1 g/dL (ref 6.4–8.3)
Sodium: 138 mEq/L (ref 133–145)
UN/CREA SOFT: 23 RATIO (ref 6–33)

## 2007-03-13 LAB — GFR

## 2007-03-13 LAB — CREATINE KINASE W/O REFLEX (SOFT): Creatine Kinase (CK): 42 U/L (ref 24–170)

## 2007-03-13 LAB — HEMOLYSIS INDEX: Hemolysis Index: 0 Units

## 2007-03-13 LAB — TROPONIN I: Troponin I: 0.02 ng/mL (ref 0.00–1.30)

## 2007-04-03 ENCOUNTER — Inpatient Hospital Stay
Admission: EM | Admit: 2007-04-03 | Disposition: A | Discharge status: Home / Self Care | Payer: Self-pay | Source: Emergency Department | Admitting: Internal Medicine

## 2007-04-03 LAB — CBC AND DIFFERENTIAL
Basophils Absolute: 0 /mm3 (ref 0.0–0.2)
Basophils: 1 % (ref 0–2)
Eosinophils Absolute: 0.3 /mm3 (ref 0.0–0.7)
Eosinophils: 5 % (ref 0–5)
Granulocytes Absolute: 3.7 /mm3 (ref 1.8–8.1)
Hematocrit: 31.6 % — ABNORMAL LOW (ref 37.0–47.0)
Hgb: 10 G/DL — ABNORMAL LOW (ref 12.0–16.0)
Immature Granulocytes Absolute: 0 CUMM (ref 0.0–0.0)
Immature Granulocytes: 0 % (ref 0–1)
Lymphocytes Absolute: 1.4 /mm3 (ref 0.5–4.4)
Lymphocytes: 23 % (ref 15–41)
MCH: 29.5 PG (ref 28.0–32.0)
MCHC: 31.6 G/DL — ABNORMAL LOW (ref 32.0–36.0)
MCV: 93.2 FL (ref 80.0–100.0)
MPV: 10.8 FL — ABNORMAL HIGH (ref 7.4–10.4)
Monocytes Absolute: 0.5 /mm3 (ref 0.0–1.2)
Monocytes: 9 % (ref 0–11)
Neutrophils %: 63 % (ref 52–75)
Platelets: 300 /mm3 (ref 140–400)
RBC: 3.39 /mm3 — ABNORMAL LOW (ref 4.20–5.40)
RDW: 15.4 % — ABNORMAL HIGH (ref 11.5–15.0)
WBC: 5.92 /mm3 (ref 3.50–10.80)

## 2007-04-03 LAB — COMPREHENSIVE METABOLIC PANEL
ALT: 59 U/L (ref 21–72)
AST (SGOT): 66 U/L — ABNORMAL HIGH (ref 8–39)
Albumin/Globulin Ratio: 0.8 — ABNORMAL LOW (ref 1.1–1.8)
Albumin: 3.2 G/DL — ABNORMAL LOW (ref 3.7–5.1)
Alkaline Phosphatase: 463 U/L — ABNORMAL HIGH (ref 43–122)
BUN: 14 MG/DL (ref 7–21)
Bilirubin, Total: 0.3 MG/DL (ref 0.2–1.3)
CO2: 23 MEQ/L (ref 22–31)
Calcium: 8.8 MG/DL (ref 8.6–10.2)
Chloride: 106 MEQ/L (ref 98–107)
Creatinine: 0.7 MG/DL (ref 0.5–1.4)
Globulin: 4 G/DL — ABNORMAL HIGH (ref 2.0–3.7)
Glucose: 93 MG/DL (ref 70–105)
Potassium: 4.8 MEQ/L (ref 3.6–5.0)
Protein, Total: 7.2 G/DL (ref 6.0–8.0)
Sodium: 140 MEQ/L (ref 136–143)

## 2007-04-03 LAB — CALCIUM IONIZED-CALC. CERNER: Calcium Ionized Calculated: 1.9 mEQ/L (ref 1.9–2.3)

## 2007-04-03 LAB — GFR

## 2007-04-03 LAB — AMYLASE: Amylase: 68 U/L (ref 30–110)

## 2007-04-03 LAB — LIPASE: Lipase: 223 U/L (ref 23–300)

## 2007-04-03 LAB — TSH: TSH: 1.03 u[IU]/mL (ref 0.34–4.82)

## 2007-04-04 LAB — COMPREHENSIVE METABOLIC PANEL
ALT: 56 U/L (ref 21–72)
AST (SGOT): 47 U/L — ABNORMAL HIGH (ref 8–39)
Albumin/Globulin Ratio: 0.7 — ABNORMAL LOW (ref 1.1–1.8)
Albumin: 2.7 G/DL — ABNORMAL LOW (ref 3.7–5.1)
Alkaline Phosphatase: 401 U/L — ABNORMAL HIGH (ref 43–122)
BUN: 11 MG/DL (ref 7–21)
Bilirubin, Total: 0.3 MG/DL (ref 0.2–1.3)
CO2: 24 MEQ/L (ref 22–31)
Calcium: 8.3 MG/DL — ABNORMAL LOW (ref 8.6–10.2)
Chloride: 107 MEQ/L (ref 98–107)
Creatinine: 0.7 MG/DL (ref 0.5–1.4)
Globulin: 3.8 G/DL — ABNORMAL HIGH (ref 2.0–3.7)
Glucose: 204 MG/DL — ABNORMAL HIGH (ref 70–105)
Potassium: 5 MEQ/L (ref 3.6–5.0)
Protein, Total: 6.5 G/DL (ref 6.0–8.0)
Sodium: 139 MEQ/L (ref 136–143)

## 2007-04-04 LAB — CBC
Hematocrit: 30 % — ABNORMAL LOW (ref 37.0–47.0)
Hgb: 9.2 G/DL — ABNORMAL LOW (ref 12.0–16.0)
MCH: 29 PG (ref 28.0–32.0)
MCHC: 30.7 G/DL — ABNORMAL LOW (ref 32.0–36.0)
MCV: 94.6 FL (ref 80.0–100.0)
MPV: 11.1 FL — ABNORMAL HIGH (ref 7.4–10.4)
Platelets: 288 /mm3 (ref 140–400)
RBC: 3.17 /mm3 — ABNORMAL LOW (ref 4.20–5.40)
RDW: 15 % (ref 11.5–15.0)
WBC: 4.64 /mm3 (ref 3.50–10.80)

## 2007-04-04 LAB — LIPASE: Lipase: 87 U/L (ref 23–300)

## 2007-04-04 LAB — CALCIUM IONIZED-CALC. CERNER: Calcium Ionized Calculated: 1.9 mEQ/L (ref 1.9–2.3)

## 2007-04-04 LAB — AMYLASE: Amylase: 41 U/L (ref 30–110)

## 2007-04-05 LAB — BASIC METABOLIC PANEL
BUN: 11 MG/DL (ref 7–21)
CO2: 26 MEQ/L (ref 22–31)
Calcium: 8.4 MG/DL — ABNORMAL LOW (ref 8.6–10.2)
Chloride: 109 MEQ/L — ABNORMAL HIGH (ref 98–107)
Creatinine: 0.7 MG/DL (ref 0.5–1.4)
Glucose: 94 MG/DL (ref 70–105)
Potassium: 4 MEQ/L (ref 3.6–5.0)
Sodium: 142 MEQ/L (ref 136–143)

## 2007-04-05 LAB — CBC
Hematocrit: 28.2 % — ABNORMAL LOW (ref 37.0–47.0)
Hgb: 8.7 G/DL — ABNORMAL LOW (ref 12.0–16.0)
MCH: 28.7 PG (ref 28.0–32.0)
MCHC: 30.9 G/DL — ABNORMAL LOW (ref 32.0–36.0)
MCV: 93.1 FL (ref 80.0–100.0)
MPV: 10.6 FL — ABNORMAL HIGH (ref 7.4–10.4)
Platelets: 275 /mm3 (ref 140–400)
RBC: 3.03 /mm3 — ABNORMAL LOW (ref 4.20–5.40)
RDW: 14.9 % (ref 11.5–15.0)
WBC: 6.47 /mm3 (ref 3.50–10.80)

## 2007-04-05 LAB — HEPATIC FUNCTION PANEL
ALT: 47 U/L (ref 21–72)
AST (SGOT): 36 U/L (ref 8–39)
Albumin/Globulin Ratio: 0.7 — ABNORMAL LOW (ref 1.1–1.8)
Albumin: 2.6 G/DL — ABNORMAL LOW (ref 3.7–5.1)
Alkaline Phosphatase: 354 U/L — ABNORMAL HIGH (ref 43–122)
Bilirubin Direct: 0 MG/DL (ref 0.0–0.3)
Bilirubin Indirect: 0.2 MG/DL (ref 0.0–1.1)
Bilirubin, Total: 0.2 MG/DL (ref 0.2–1.3)
Globulin: 3.6 G/DL (ref 2.0–3.7)
Protein, Total: 6.2 G/DL (ref 6.0–8.0)

## 2007-04-05 LAB — AMYLASE: Amylase: 48 U/L (ref 30–110)

## 2007-04-05 LAB — GFR

## 2007-04-06 LAB — GFR

## 2007-04-06 LAB — CBC
Hematocrit: 30.9 % — ABNORMAL LOW (ref 37.0–47.0)
Hgb: 9.5 G/DL — ABNORMAL LOW (ref 12.0–16.0)
MCH: 29 PG (ref 28.0–32.0)
MCHC: 30.7 G/DL — ABNORMAL LOW (ref 32.0–36.0)
MCV: 94.2 FL (ref 80.0–100.0)
MPV: 10.7 FL — ABNORMAL HIGH (ref 7.4–10.4)
Platelets: 305 /mm3 (ref 140–400)
RBC: 3.28 /mm3 — ABNORMAL LOW (ref 4.20–5.40)
RDW: 15 % (ref 11.5–15.0)
WBC: 7.49 /mm3 (ref 3.50–10.80)

## 2007-04-06 LAB — COMPREHENSIVE METABOLIC PANEL
ALT: 46 U/L (ref 21–72)
AST (SGOT): 40 U/L — ABNORMAL HIGH (ref 8–39)
Albumin/Globulin Ratio: 0.8 — ABNORMAL LOW (ref 1.1–1.8)
Albumin: 2.9 G/DL — ABNORMAL LOW (ref 3.7–5.1)
Alkaline Phosphatase: 400 U/L — ABNORMAL HIGH (ref 43–122)
BUN: 11 MG/DL (ref 7–21)
Bilirubin, Total: 0.2 MG/DL (ref 0.2–1.3)
CO2: 26 MEQ/L (ref 22–31)
Calcium: 8.4 MG/DL — ABNORMAL LOW (ref 8.6–10.2)
Chloride: 105 MEQ/L (ref 98–107)
Creatinine: 0.6 MG/DL (ref 0.5–1.4)
Globulin: 3.8 G/DL — ABNORMAL HIGH (ref 2.0–3.7)
Glucose: 98 MG/DL (ref 70–105)
Potassium: 3.6 MEQ/L (ref 3.6–5.0)
Protein, Total: 6.7 G/DL (ref 6.0–8.0)
Sodium: 142 MEQ/L (ref 136–143)

## 2007-04-06 LAB — PT/INR
PT INR: 1.3 {INR} — ABNORMAL HIGH (ref 0.9–1.1)
PT: 15.2 s — ABNORMAL HIGH (ref 10.8–13.3)

## 2007-04-06 LAB — TYPE AND SCREEN
AB Screen Gel: NEGATIVE
ABO Rh: O POS

## 2007-04-06 LAB — CALCIUM IONIZED-CALC. CERNER: Calcium Ionized Calculated: 1.9 mEQ/L (ref 1.9–2.3)

## 2007-04-06 LAB — ALKALINE PHOSPHATASE FRACTIONATION

## 2007-04-06 LAB — D-DIMER - SOFT: D-Dimer: >=0.51

## 2007-04-06 LAB — AMYLASE: Amylase: 57 U/L (ref 30–110)

## 2007-04-07 LAB — PT/INR
PT INR: 1.3 {INR} — ABNORMAL HIGH (ref 0.9–1.1)
PT: 14.9 s — ABNORMAL HIGH (ref 10.8–13.3)

## 2007-04-07 LAB — CBC
Hematocrit: 30.6 % — ABNORMAL LOW (ref 37.0–47.0)
Hgb: 9.4 G/DL — ABNORMAL LOW (ref 12.0–16.0)
MCH: 28.5 PG (ref 28.0–32.0)
MCHC: 30.7 G/DL — ABNORMAL LOW (ref 32.0–36.0)
MCV: 92.7 FL (ref 80.0–100.0)
MPV: 10.9 FL — ABNORMAL HIGH (ref 7.4–10.4)
Platelets: 327 /mm3 (ref 140–400)
RBC: 3.3 /mm3 — ABNORMAL LOW (ref 4.20–5.40)
RDW: 14.8 % (ref 11.5–15.0)
WBC: 6.68 /mm3 (ref 3.50–10.80)

## 2007-04-08 LAB — CBC
Hematocrit: 30 % — ABNORMAL LOW (ref 37.0–47.0)
Hgb: 9.4 G/DL — ABNORMAL LOW (ref 12.0–16.0)
MCH: 28.9 PG (ref 28.0–32.0)
MCHC: 31.3 G/DL — ABNORMAL LOW (ref 32.0–36.0)
MCV: 92.3 FL (ref 80.0–100.0)
MPV: 10.8 FL — ABNORMAL HIGH (ref 7.4–10.4)
Platelets: 339 /mm3 (ref 140–400)
RBC: 3.25 /mm3 — ABNORMAL LOW (ref 4.20–5.40)
RDW: 14.9 % (ref 11.5–15.0)
WBC: 9.19 /mm3 (ref 3.50–10.80)

## 2007-04-08 LAB — PT/INR
PT INR: 1.3 {INR} — ABNORMAL HIGH (ref 0.9–1.1)
PT: 15 s — ABNORMAL HIGH (ref 10.8–13.3)

## 2007-04-08 LAB — GLIADIN ANTIBODY (IGG,IGA)

## 2007-04-08 LAB — MISCELLANEOUS QUEST TEST

## 2007-04-09 LAB — CBC
Hematocrit: 30.4 % — ABNORMAL LOW (ref 37.0–47.0)
Hgb: 9.5 G/DL — ABNORMAL LOW (ref 12.0–16.0)
MCH: 28.8 PG (ref 28.0–32.0)
MCHC: 31.3 G/DL — ABNORMAL LOW (ref 32.0–36.0)
MCV: 92.1 FL (ref 80.0–100.0)
MPV: 10.7 FL — ABNORMAL HIGH (ref 7.4–10.4)
Platelets: 360 /mm3 (ref 140–400)
RBC: 3.3 /mm3 — ABNORMAL LOW (ref 4.20–5.40)
RDW: 15.1 % — ABNORMAL HIGH (ref 11.5–15.0)
WBC: 8.55 /mm3 (ref 3.50–10.80)

## 2007-04-09 LAB — PT/INR
PT INR: 1.3 {INR} — ABNORMAL HIGH (ref 0.9–1.1)
PT: 14.6 s — ABNORMAL HIGH (ref 10.8–13.3)

## 2007-04-09 LAB — ALKALINE PHOSPHATASE FRACTIONATION

## 2007-05-02 ENCOUNTER — Ambulatory Visit
Admit: 2007-05-02 | Disposition: A | Discharge status: Home / Self Care | Payer: Self-pay | Source: Ambulatory Visit | Admitting: Internal Medicine

## 2007-05-02 LAB — COMPREHENSIVE METABOLIC PANEL
ALT: 27 U/L (ref 21–72)
AST (SGOT): 32 U/L (ref 8–39)
Albumin/Globulin Ratio: 0.8 — ABNORMAL LOW (ref 1.1–1.8)
Albumin: 3.5 G/DL — ABNORMAL LOW (ref 3.7–5.1)
Alkaline Phosphatase: 189 U/L — ABNORMAL HIGH (ref 43–122)
BUN: 19 MG/DL (ref 7–21)
Bilirubin, Total: 0.3 MG/DL (ref 0.2–1.3)
CO2: 25 MEQ/L (ref 22–31)
Calcium: 9.5 MG/DL (ref 8.6–10.2)
Chloride: 104 MEQ/L (ref 98–107)
Creatinine: 0.9 MG/DL (ref 0.5–1.4)
Globulin: 4.3 G/DL — ABNORMAL HIGH (ref 2.0–3.7)
Glucose: 94 MG/DL (ref 70–105)
Potassium: 4.6 MEQ/L (ref 3.6–5.0)
Protein, Total: 7.8 G/DL (ref 6.0–8.0)
Sodium: 141 MEQ/L (ref 136–143)

## 2007-05-02 LAB — CBC
Hematocrit: 27.4 % — ABNORMAL LOW (ref 37.0–47.0)
Hgb: 8.6 G/DL — ABNORMAL LOW (ref 12.0–16.0)
MCH: 28.4 PG (ref 28.0–32.0)
MCHC: 31.4 G/DL — ABNORMAL LOW (ref 32.0–36.0)
MCV: 90.4 FL (ref 80.0–100.0)
MPV: 10.7 FL — ABNORMAL HIGH (ref 7.4–10.4)
Platelets: 381 /mm3 (ref 140–400)
RBC: 3.03 /mm3 — ABNORMAL LOW (ref 4.20–5.40)
RDW: 14.9 % (ref 11.5–15.0)
WBC: 10.04 /mm3 (ref 3.50–10.80)

## 2007-05-02 LAB — CALCIUM IONIZED-CALC. CERNER: Calcium Ionized Calculated: 2 mEQ/L (ref 1.9–2.3)

## 2007-05-02 LAB — BILIRUBIN, DIRECT: Bilirubin Direct: 0 MG/DL (ref 0.0–0.3)

## 2007-05-02 LAB — GFR

## 2007-07-28 ENCOUNTER — Ambulatory Visit
Admit: 2007-07-28 | Disposition: A | Discharge status: Home / Self Care | Payer: Self-pay | Source: Ambulatory Visit | Admitting: Allergy

## 2007-07-28 LAB — CBC
Hematocrit: 32 % — ABNORMAL LOW (ref 37.0–47.0)
Hgb: 9.9 G/DL — ABNORMAL LOW (ref 12.0–16.0)
MCH: 27.9 PG — ABNORMAL LOW (ref 28.0–32.0)
MCHC: 30.9 G/DL — ABNORMAL LOW (ref 32.0–36.0)
MCV: 90.1 FL (ref 80.0–100.0)
MPV: 10.6 FL (ref 9.4–12.3)
Platelets: 288 /mm3 (ref 140–400)
RBC: 3.55 /mm3 — ABNORMAL LOW (ref 4.20–5.40)
RDW: 16 % — ABNORMAL HIGH (ref 11.5–15.0)
WBC: 8.47 /mm3 (ref 3.50–10.80)

## 2007-07-28 LAB — COMPREHENSIVE METABOLIC PANEL
ALT: 45 U/L (ref 21–72)
AST (SGOT): 43 U/L — ABNORMAL HIGH (ref 8–39)
Albumin/Globulin Ratio: 0.9 — ABNORMAL LOW (ref 1.1–1.8)
Albumin: 3.8 G/DL (ref 3.7–5.1)
Alkaline Phosphatase: 289 U/L — ABNORMAL HIGH (ref 43–122)
BUN: 22 MG/DL — ABNORMAL HIGH (ref 7–21)
Bilirubin, Total: 0.3 MG/DL (ref 0.2–1.3)
CO2: 26 MEQ/L (ref 22–31)
Calcium: 9.3 MG/DL (ref 8.6–10.2)
Chloride: 107 MEQ/L (ref 98–107)
Creatinine: 0.9 MG/DL (ref 0.5–1.4)
Globulin: 4.4 G/DL — ABNORMAL HIGH (ref 2.0–3.7)
Glucose: 161 MG/DL — ABNORMAL HIGH (ref 70–105)
Potassium: 4.2 MEQ/L (ref 3.6–5.0)
Protein, Total: 8.2 G/DL — ABNORMAL HIGH (ref 6.0–8.0)
Sodium: 142 MEQ/L (ref 136–143)

## 2007-07-28 LAB — CALCIUM IONIZED-CALC. CERNER: Calcium Ionized Calculated: 1.9 mEQ/L (ref 1.9–2.3)

## 2007-07-28 LAB — HEPATITIS B SURFACE ANTIGEN W/ REFLEX TO CONFIRMATION: Hepatitis B Surface Antigen: NEGATIVE

## 2007-07-28 LAB — HEPATITIS C ANTIBODY: Hepatitis C, AB: NEGATIVE

## 2007-07-28 LAB — GFR

## 2007-08-22 ENCOUNTER — Ambulatory Visit
Admit: 2007-08-22 | Disposition: A | Discharge status: Home / Self Care | Payer: Self-pay | Source: Ambulatory Visit | Admitting: Rheumatology

## 2007-08-22 LAB — CBC
Hematocrit: 31.5 % — ABNORMAL LOW (ref 37.0–47.0)
Hgb: 10 G/DL — ABNORMAL LOW (ref 12.0–16.0)
MCH: 28.9 PG (ref 28.0–32.0)
MCHC: 31.7 G/DL — ABNORMAL LOW (ref 32.0–36.0)
MCV: 91 FL (ref 80.0–100.0)
MPV: 11 FL (ref 9.4–12.3)
Platelets: 264 /mm3 (ref 140–400)
RBC: 3.46 /mm3 — ABNORMAL LOW (ref 4.20–5.40)
RDW: 15.5 % — ABNORMAL HIGH (ref 11.5–15.0)
WBC: 8.3 /mm3 (ref 3.50–10.80)

## 2007-08-22 LAB — COMPREHENSIVE METABOLIC PANEL
ALT: 39 U/L (ref 21–72)
AST (SGOT): 38 U/L (ref 8–39)
Albumin/Globulin Ratio: 0.9 — ABNORMAL LOW (ref 1.1–1.8)
Albumin: 3.6 G/DL — ABNORMAL LOW (ref 3.7–5.1)
Alkaline Phosphatase: 274 U/L — ABNORMAL HIGH (ref 43–122)
BUN: 25 MG/DL — ABNORMAL HIGH (ref 7–21)
Bilirubin, Total: 0.2 MG/DL (ref 0.2–1.3)
CO2: 24 MEQ/L (ref 22–31)
Calcium: 8.8 MG/DL (ref 8.6–10.2)
Chloride: 104 MEQ/L (ref 98–107)
Creatinine: 0.8 MG/DL (ref 0.5–1.4)
Globulin: 4.2 G/DL — ABNORMAL HIGH (ref 2.0–3.7)
Glucose: 182 MG/DL — ABNORMAL HIGH (ref 70–105)
Potassium: 4.1 MEQ/L (ref 3.6–5.0)
Protein, Total: 7.8 G/DL (ref 6.0–8.0)
Sodium: 138 MEQ/L (ref 136–143)

## 2007-08-22 LAB — IRON PROFILE
Iron Saturation: 19 % (ref 15–50)
Iron: 44 UG/DL (ref 35–150)
TIBC: 229 UG/DL — ABNORMAL LOW (ref 250–450)

## 2007-08-22 LAB — GFR

## 2007-08-22 LAB — CALCIUM IONIZED-CALC. CERNER: Calcium Ionized Calculated: 1.8 mEQ/L — ABNORMAL LOW (ref 1.9–2.3)

## 2007-08-22 LAB — FERRITIN: Ferritin: 61.8 ng/mL (ref 11.1–264.0)

## 2007-11-03 ENCOUNTER — Ambulatory Visit
Admit: 2007-11-03 | Disposition: A | Discharge status: Home / Self Care | Payer: Self-pay | Source: Ambulatory Visit | Admitting: Internal Medicine

## 2007-11-17 ENCOUNTER — Ambulatory Visit
Admit: 2007-11-17 | Disposition: A | Discharge status: Home / Self Care | Payer: Self-pay | Source: Ambulatory Visit | Admitting: Internal Medicine

## 2008-06-28 ENCOUNTER — Ambulatory Visit
Admit: 2008-06-28 | Disposition: A | Discharge status: Home / Self Care | Payer: Self-pay | Source: Ambulatory Visit | Admitting: Internal Medicine

## 2008-06-28 LAB — BASIC METABOLIC PANEL
BUN: 18 MG/DL (ref 7–21)
CO2: 26 MEQ/L (ref 22–31)
Calcium: 9.5 MG/DL (ref 8.6–10.2)
Chloride: 106 MEQ/L (ref 98–107)
Creatinine: 0.9 MG/DL (ref 0.5–1.4)
Glucose: 79 MG/DL (ref 70–105)
Potassium: 4.5 MEQ/L (ref 3.6–5.0)
Sodium: 141 MEQ/L (ref 136–143)

## 2008-06-28 LAB — CBC
Hematocrit: 34.7 % — ABNORMAL LOW (ref 37.0–47.0)
Hgb: 11.2 G/DL — ABNORMAL LOW (ref 12.0–16.0)
MCH: 30.3 PG (ref 28.0–32.0)
MCHC: 32.3 G/DL (ref 32.0–36.0)
MCV: 93.8 FL (ref 80.0–100.0)
MPV: 11.4 FL (ref 9.4–12.3)
Platelets: 230 /mm3 (ref 140–400)
RBC: 3.7 /mm3 — ABNORMAL LOW (ref 4.20–5.40)
RDW: 13.8 % (ref 11.5–15.0)
WBC: 6.22 /mm3 (ref 3.50–10.80)

## 2008-06-28 LAB — HEMOGLOBIN A1C: Hemoglobin A1C: 7.2 % — ABNORMAL HIGH (ref ?–6.0)

## 2008-06-28 LAB — GFR

## 2008-09-10 ENCOUNTER — Inpatient Hospital Stay
Admission: EM | Admit: 2008-09-10 | Disposition: A | Discharge status: Home / Self Care | Payer: Self-pay | Source: Ambulatory Visit | Admitting: Internal Medicine

## 2008-09-10 LAB — COMPREHENSIVE METABOLIC PANEL
ALT: 24 U/L (ref 21–72)
AST (SGOT): 41 U/L — ABNORMAL HIGH (ref 8–39)
Albumin/Globulin Ratio: 0.8 — ABNORMAL LOW (ref 1.1–1.8)
Albumin: 3.4 G/DL — ABNORMAL LOW (ref 3.7–5.1)
Alkaline Phosphatase: 307 U/L — ABNORMAL HIGH (ref 43–122)
BUN: 16 MG/DL (ref 7–21)
Bilirubin, Total: 0.4 MG/DL (ref 0.2–1.3)
CO2: 27 MEQ/L (ref 22–31)
Calcium: 9.4 MG/DL (ref 8.6–10.2)
Chloride: 106 MEQ/L (ref 98–107)
Creatinine: 0.8 MG/DL (ref 0.5–1.4)
Globulin: 4.5 G/DL — ABNORMAL HIGH (ref 2.0–3.7)
Glucose: 75 MG/DL (ref 70–105)
Potassium: 3.9 MEQ/L (ref 3.6–5.0)
Protein, Total: 7.9 G/DL (ref 6.0–8.0)
Sodium: 144 MEQ/L — ABNORMAL HIGH (ref 136–143)

## 2008-09-10 LAB — CBC AND DIFFERENTIAL
Basophils Absolute: 0 /mm3 (ref 0.0–0.2)
Basophils: 0 % (ref 0–2)
Eosinophils Absolute: 0.1 /mm3 (ref 0.0–0.7)
Eosinophils: 1 % (ref 0–5)
Granulocytes Absolute: 9.1 /mm3 — ABNORMAL HIGH (ref 1.8–8.1)
Hematocrit: 31.6 % — ABNORMAL LOW (ref 37.0–47.0)
Hgb: 10 G/DL — ABNORMAL LOW (ref 12.0–16.0)
Lymphocytes Absolute: 0.5 /mm3 (ref 0.5–4.4)
Lymphocytes: 5 % — ABNORMAL LOW (ref 15–41)
MCH: 31.2 PG (ref 28.0–32.0)
MCHC: 31.6 G/DL — ABNORMAL LOW (ref 32.0–36.0)
MCV: 98.4 FL (ref 80.0–100.0)
MPV: 10.8 FL (ref 9.4–12.3)
Monocytes Absolute: 0.7 /mm3 (ref 0.0–1.2)
Monocytes: 6 % (ref 0–11)
Neutrophils %: 88 % — ABNORMAL HIGH (ref 52–75)
Platelets: 223 /mm3 (ref 140–400)
RBC: 3.21 /mm3 — ABNORMAL LOW (ref 4.20–5.40)
RDW: 13.9 % (ref 11.5–15.0)
WBC: 10.44 /mm3 (ref 3.50–10.80)

## 2008-09-10 LAB — PT AND APTT
PT INR: 1.2 {INR} — ABNORMAL HIGH (ref 0.9–1.1)
PT: 14.2 s — ABNORMAL HIGH (ref 10.8–13.3)
PTT: 24 s (ref 21–32)

## 2008-09-10 LAB — URINALYSIS
Bilirubin, UA: NEGATIVE
Blood, UA: NEGATIVE
Glucose, UA: NEGATIVE
Ketones UA: NEGATIVE
Leukocyte Esterase, UA: NEGATIVE
Nitrite, UA: NEGATIVE
Protein, UR: NEGATIVE
Specific Gravity UA POCT: 1.015 (ref <=1.030)
Urine pH: 6 (ref 5.0–8.0)
Urobilinogen, UA: 0.2

## 2008-09-10 LAB — LIPASE: Lipase: 71 U/L (ref 23–300)

## 2008-09-10 LAB — CALCIUM IONIZED-CALC. CERNER: Calcium Ionized Calculated: 1.9 mEQ/L (ref 1.9–2.3)

## 2008-09-10 LAB — GFR

## 2008-09-10 LAB — TROPONIN I QUANTITATIVE LEVEL CERNER
Troponin I: 0 ng/mL (ref 0.00–0.03)
Troponin I: 0.01 ng/mL (ref 0.00–0.03)

## 2008-09-10 LAB — AMYLASE: Amylase: 65 U/L (ref 30–110)

## 2008-09-11 LAB — COMPREHENSIVE METABOLIC PANEL
ALT: 65 U/L (ref 21–72)
AST (SGOT): 115 U/L — ABNORMAL HIGH (ref 8–39)
Albumin/Globulin Ratio: 0.6 — ABNORMAL LOW (ref 1.1–1.8)
Albumin: 2.4 G/DL — ABNORMAL LOW (ref 3.7–5.1)
Alkaline Phosphatase: 251 U/L — ABNORMAL HIGH (ref 43–122)
BUN: 15 MG/DL (ref 7–21)
Bilirubin, Total: 0.5 MG/DL (ref 0.2–1.3)
CO2: 28 MEQ/L (ref 22–31)
Calcium: 8.3 MG/DL — ABNORMAL LOW (ref 8.6–10.2)
Chloride: 103 MEQ/L (ref 98–107)
Creatinine: 1 MG/DL (ref 0.5–1.4)
Globulin: 3.8 G/DL — ABNORMAL HIGH (ref 2.0–3.7)
Glucose: 71 MG/DL (ref 70–105)
Potassium: 4.3 MEQ/L (ref 3.6–5.0)
Protein, Total: 6.2 G/DL (ref 6.0–8.0)
Sodium: 139 MEQ/L (ref 136–143)

## 2008-09-11 LAB — CBC
Hematocrit: 28.7 % — ABNORMAL LOW (ref 37.0–47.0)
Hgb: 9.3 G/DL — ABNORMAL LOW (ref 12.0–16.0)
MCH: 30.6 PG (ref 28.0–32.0)
MCHC: 32.4 G/DL (ref 32.0–36.0)
MCV: 94.4 FL (ref 80.0–100.0)
MPV: 10.3 FL (ref 9.4–12.3)
Platelets: 194 /mm3 (ref 140–400)
RBC: 3.04 /mm3 — ABNORMAL LOW (ref 4.20–5.40)
RDW: 14 % (ref 11.5–15.0)
WBC: 10.31 /mm3 (ref 3.50–10.80)

## 2008-09-11 LAB — TROPONIN I QUANTITATIVE LEVEL CERNER: Troponin I: 0.01 ng/mL (ref 0.00–0.03)

## 2008-09-11 LAB — CALCIUM IONIZED-CALC. CERNER: Calcium Ionized Calculated: 2 mEQ/L (ref 1.9–2.3)

## 2008-09-12 LAB — GFR

## 2008-09-12 LAB — IRON: Iron: 44 ug/dL (ref 40–145)

## 2008-09-12 LAB — CBC
Hematocrit: 29.9 % — ABNORMAL LOW (ref 37.0–47.0)
Hgb: 9.5 G/DL — ABNORMAL LOW (ref 12.0–16.0)
MCH: 30 PG (ref 28.0–32.0)
MCHC: 31.8 G/DL — ABNORMAL LOW (ref 32.0–36.0)
MCV: 94.3 FL (ref 80.0–100.0)
MPV: 11.7 FL (ref 9.4–12.3)
Platelets: 229 /mm3 (ref 140–400)
RBC: 3.17 /mm3 — ABNORMAL LOW (ref 4.20–5.40)
RDW: 13.8 % (ref 11.5–15.0)
WBC: 6.75 /mm3 (ref 3.50–10.80)

## 2008-09-12 LAB — BASIC METABOLIC PANEL
BUN: 11 MG/DL (ref 7–21)
CO2: 27 MEQ/L (ref 22–31)
Calcium: 8.4 MG/DL — ABNORMAL LOW (ref 8.6–10.2)
Chloride: 105 MEQ/L (ref 98–107)
Creatinine: 1 MG/DL (ref 0.5–1.4)
Glucose: 140 MG/DL — ABNORMAL HIGH (ref 70–105)
Potassium: 4 MEQ/L (ref 3.6–5.0)
Sodium: 140 MEQ/L (ref 136–143)

## 2008-09-13 LAB — CBC
Hematocrit: 30.3 % — ABNORMAL LOW (ref 37.0–47.0)
Hgb: 9.7 G/DL — ABNORMAL LOW (ref 12.0–16.0)
MCH: 30.1 PG (ref 28.0–32.0)
MCHC: 32 G/DL (ref 32.0–36.0)
MCV: 94.1 FL (ref 80.0–100.0)
MPV: 11.9 FL (ref 9.4–12.3)
Platelets: 230 /mm3 (ref 140–400)
RBC: 3.22 /mm3 — ABNORMAL LOW (ref 4.20–5.40)
RDW: 13.7 % (ref 11.5–15.0)
WBC: 5.95 /mm3 (ref 3.50–10.80)

## 2008-09-13 LAB — BASIC METABOLIC PANEL
BUN: 13 MG/DL (ref 7–21)
CO2: 27 MEQ/L (ref 22–31)
Calcium: 8.5 MG/DL — ABNORMAL LOW (ref 8.6–10.2)
Chloride: 104 MEQ/L (ref 98–107)
Creatinine: 0.9 MG/DL (ref 0.5–1.4)
Glucose: 178 MG/DL — ABNORMAL HIGH (ref 70–105)
Potassium: 3.8 MEQ/L (ref 3.6–5.0)
Sodium: 140 MEQ/L (ref 136–143)

## 2008-09-17 LAB — GFR

## 2008-09-17 LAB — CBC
Hematocrit: 34 % — ABNORMAL LOW (ref 37.0–47.0)
Hgb: 11.2 G/DL — ABNORMAL LOW (ref 12.0–16.0)
MCH: 30.9 PG (ref 28.0–32.0)
MCHC: 32.9 G/DL (ref 32.0–36.0)
MCV: 93.7 FL (ref 80.0–100.0)
MPV: 11.5 FL (ref 9.4–12.3)
Platelets: 250 /mm3 (ref 140–400)
RBC: 3.63 /mm3 — ABNORMAL LOW (ref 4.20–5.40)
RDW: 13.7 % (ref 11.5–15.0)
WBC: 12.25 /mm3 — ABNORMAL HIGH (ref 3.50–10.80)

## 2008-09-17 LAB — BASIC METABOLIC PANEL
BUN: 17 MG/DL (ref 7–21)
CO2: 26 MEQ/L (ref 22–31)
Calcium: 9 MG/DL (ref 8.6–10.2)
Chloride: 101 MEQ/L (ref 98–107)
Creatinine: 1 MG/DL (ref 0.5–1.4)
Glucose: 233 MG/DL — ABNORMAL HIGH (ref 70–105)
Potassium: 3.5 MEQ/L — ABNORMAL LOW (ref 3.6–5.0)
Sodium: 140 MEQ/L (ref 136–143)

## 2008-09-20 LAB — BASIC METABOLIC PANEL
BUN: 21 MG/DL (ref 7–21)
CO2: 26 MEQ/L (ref 22–31)
Calcium: 8.9 MG/DL (ref 8.6–10.2)
Chloride: 105 MEQ/L (ref 98–107)
Creatinine: 1.1 MG/DL (ref 0.5–1.4)
Glucose: 70 MG/DL (ref 70–105)
Potassium: 3.5 MEQ/L — ABNORMAL LOW (ref 3.6–5.0)
Sodium: 141 MEQ/L (ref 136–143)

## 2008-09-20 LAB — CBC AND DIFFERENTIAL
Basophils Absolute: 0.1 /mm3 (ref 0.0–0.2)
Basophils: 1 % (ref 0–2)
Eosinophils Absolute: 0.2 /mm3 (ref 0.0–0.7)
Eosinophils: 2 % (ref 0–5)
Granulocytes Absolute: 7.3 /mm3 (ref 1.8–8.1)
Hematocrit: 30.9 % — ABNORMAL LOW (ref 37.0–47.0)
Hgb: 10 G/DL — ABNORMAL LOW (ref 12.0–16.0)
Immature Granulocytes Absolute: 0.1 CUMM — ABNORMAL HIGH (ref 0.0–0.0)
Immature Granulocytes: 1 % (ref 0–1)
Lymphocytes Absolute: 1.8 /mm3 (ref 0.5–4.4)
Lymphocytes: 17 % (ref 15–41)
MCH: 30.3 PG (ref 28.0–32.0)
MCHC: 32.4 G/DL (ref 32.0–36.0)
MCV: 93.6 FL (ref 80.0–100.0)
MPV: 11.9 FL (ref 9.4–12.3)
Monocytes Absolute: 1.2 /mm3 (ref 0.0–1.2)
Monocytes: 11 % (ref 0–11)
Neutrophils %: 69 % (ref 52–75)
Platelets: 228 /mm3 (ref 140–400)
RBC: 3.3 /mm3 — ABNORMAL LOW (ref 4.20–5.40)
RDW: 14 % (ref 11.5–15.0)
WBC: 10.58 /mm3 (ref 3.50–10.80)

## 2008-09-20 LAB — MAGNESIUM: Magnesium: 1.7 MG/DL (ref 1.6–2.3)

## 2008-09-20 LAB — GFR

## 2008-09-21 LAB — GFR

## 2008-09-21 LAB — BASIC METABOLIC PANEL
BUN: 21 MG/DL (ref 7–21)
CO2: 26 MEQ/L (ref 22–31)
Calcium: 9.2 MG/DL (ref 8.6–10.2)
Chloride: 106 MEQ/L (ref 98–107)
Creatinine: 0.9 MG/DL (ref 0.5–1.4)
Glucose: 127 MG/DL — ABNORMAL HIGH (ref 70–105)
Potassium: 4 MEQ/L (ref 3.6–5.0)
Sodium: 142 MEQ/L (ref 136–143)

## 2008-09-21 LAB — MAGNESIUM: Magnesium: 1.8 MG/DL (ref 1.6–2.3)

## 2008-12-31 ENCOUNTER — Ambulatory Visit
Admit: 2008-12-31 | Disposition: A | Discharge status: Home / Self Care | Payer: Self-pay | Source: Ambulatory Visit | Admitting: Internal Medicine

## 2008-12-31 LAB — COMPREHENSIVE METABOLIC PANEL
ALT: 71 U/L (ref 21–72)
AST (SGOT): 74 U/L — ABNORMAL HIGH (ref 8–39)
Albumin/Globulin Ratio: 0.8 — ABNORMAL LOW (ref 1.1–1.8)
Albumin: 3.6 G/DL — ABNORMAL LOW (ref 3.7–5.1)
Alkaline Phosphatase: 333 U/L — ABNORMAL HIGH (ref 43–122)
BUN: 16 MG/DL (ref 7–21)
Bilirubin, Total: 0.2 MG/DL (ref 0.2–1.3)
CO2: 27 MEQ/L (ref 22–31)
Calcium: 9 MG/DL (ref 8.6–10.2)
Chloride: 103 MEQ/L (ref 98–107)
Creatinine: 0.9 MG/DL (ref 0.5–1.4)
Globulin: 4.4 G/DL — ABNORMAL HIGH (ref 2.0–3.7)
Glucose: 135 MG/DL — ABNORMAL HIGH (ref 70–105)
Potassium: 3.7 MEQ/L (ref 3.6–5.0)
Protein, Total: 8 G/DL (ref 6.0–8.0)
Sodium: 142 MEQ/L (ref 136–143)

## 2008-12-31 LAB — CBC
Hematocrit: 32.1 % — ABNORMAL LOW (ref 37.0–47.0)
Hgb: 10.2 G/DL — ABNORMAL LOW (ref 12.0–16.0)
MCH: 30.4 PG (ref 28.0–32.0)
MCHC: 31.8 G/DL — ABNORMAL LOW (ref 32.0–36.0)
MCV: 95.5 FL (ref 80.0–100.0)
MPV: 11.2 FL (ref 9.4–12.3)
Platelets: 239 /mm3 (ref 140–400)
RBC: 3.36 /mm3 — ABNORMAL LOW (ref 4.20–5.40)
RDW: 14.1 % (ref 11.5–15.0)
WBC: 6.53 /mm3 (ref 3.50–10.80)

## 2008-12-31 LAB — GFR

## 2008-12-31 LAB — CALCIUM IONIZED-CALC. CERNER: Calcium Ionized Calculated: 1.8 mEQ/L — ABNORMAL LOW (ref 1.9–2.3)

## 2009-01-07 ENCOUNTER — Ambulatory Visit
Admit: 2009-01-07 | Disposition: A | Discharge status: Home / Self Care | Payer: Self-pay | Source: Ambulatory Visit | Admitting: Internal Medicine

## 2009-02-02 ENCOUNTER — Ambulatory Visit
Admit: 2009-02-02 | Disposition: A | Discharge status: Home / Self Care | Payer: Self-pay | Source: Ambulatory Visit | Admitting: Internal Medicine

## 2009-02-02 LAB — URINALYSIS
Bilirubin, UA: NEGATIVE
Glucose, UA: NEGATIVE
Ketones UA: NEGATIVE
Leukocyte Esterase, UA: NEGATIVE
Nitrite, UA: NEGATIVE
Protein, UR: NEGATIVE
Specific Gravity UA POCT: 1.01 (ref <=1.030)
Urine pH: 6 (ref 5.0–8.0)
Urobilinogen, UA: 0.2

## 2009-02-02 LAB — COMPREHENSIVE METABOLIC PANEL
ALT: 37 U/L (ref 21–72)
AST (SGOT): 49 U/L — ABNORMAL HIGH (ref 8–39)
Albumin/Globulin Ratio: 0.8 — ABNORMAL LOW (ref 1.1–1.8)
Albumin: 3.6 G/DL — ABNORMAL LOW (ref 3.7–5.1)
Alkaline Phosphatase: 311 U/L — ABNORMAL HIGH (ref 43–122)
BUN: 17 MG/DL (ref 7–21)
Bilirubin, Total: 0.1 MG/DL — ABNORMAL LOW (ref 0.2–1.3)
CO2: 26 MEQ/L (ref 22–31)
Calcium: 9.2 MG/DL (ref 8.6–10.2)
Chloride: 104 MEQ/L (ref 98–107)
Creatinine: 0.9 MG/DL (ref 0.5–1.4)
Globulin: 4.4 G/DL — ABNORMAL HIGH (ref 2.0–3.7)
Glucose: 130 MG/DL — ABNORMAL HIGH (ref 70–105)
Potassium: 4.2 MEQ/L (ref 3.6–5.0)
Protein, Total: 8 G/DL (ref 6.0–8.0)
Sodium: 140 MEQ/L (ref 136–143)

## 2009-02-02 LAB — CBC
Hematocrit: 32.8 % — ABNORMAL LOW (ref 37.0–47.0)
Hgb: 10.5 G/DL — ABNORMAL LOW (ref 12.0–16.0)
MCH: 30.3 PG (ref 28.0–32.0)
MCHC: 32 G/DL (ref 32.0–36.0)
MCV: 94.5 FL (ref 80.0–100.0)
MPV: 11.2 FL (ref 9.4–12.3)
Platelets: 225 /mm3 (ref 140–400)
RBC: 3.47 /mm3 — ABNORMAL LOW (ref 4.20–5.40)
RDW: 14.1 % (ref 11.5–15.0)
WBC: 7.09 /mm3 (ref 3.50–10.80)

## 2009-02-02 LAB — URINE MICROSCOPIC

## 2009-02-02 LAB — HEMOGLOBIN A1C: Hemoglobin A1C: 7.5 % — ABNORMAL HIGH (ref ?–6.0)

## 2009-02-02 LAB — GFR

## 2009-02-02 LAB — CALCIUM IONIZED-CALC. CERNER: Calcium Ionized Calculated: 1.9 mEQ/L (ref 1.9–2.3)

## 2009-03-08 ENCOUNTER — Ambulatory Visit
Admit: 2009-03-08 | Disposition: A | Discharge status: Home / Self Care | Payer: Self-pay | Source: Ambulatory Visit | Admitting: Internal Medicine

## 2009-03-08 LAB — TSH: TSH: 1.53 u[IU]/mL (ref 0.465–4.680)

## 2009-03-08 LAB — COMPREHENSIVE METABOLIC PANEL
ALT: 67 U/L (ref 21–72)
AST (SGOT): 79 U/L — ABNORMAL HIGH (ref 8–39)
Albumin/Globulin Ratio: 0.8 — ABNORMAL LOW (ref 1.1–1.8)
Albumin: 3.7 G/DL (ref 3.7–5.1)
Alkaline Phosphatase: 279 U/L — ABNORMAL HIGH (ref 43–122)
BUN: 22 MG/DL — ABNORMAL HIGH (ref 7–21)
Bilirubin, Total: 0.2 MG/DL (ref 0.2–1.3)
CO2: 28 MEQ/L (ref 22–31)
Calcium: 9.2 MG/DL (ref 8.6–10.2)
Chloride: 106 MEQ/L (ref 98–107)
Creatinine: 1 MG/DL (ref 0.5–1.4)
Globulin: 4.6 G/DL — ABNORMAL HIGH (ref 2.0–3.7)
Glucose: 84 MG/DL (ref 70–105)
Potassium: 4 MEQ/L (ref 3.6–5.0)
Protein, Total: 8.3 G/DL — ABNORMAL HIGH (ref 6.0–8.0)
Sodium: 143 MEQ/L (ref 136–143)

## 2009-03-08 LAB — LIPID PANEL
Cholesterol: 180 mg/dL (ref ?–199)
HDL: 48 mg/dL (ref 40–65)
LDL Calculated: 110 mg/dL — ABNORMAL HIGH (ref 0–99)
Triglycerides: 109 mg/dL (ref 34–149)
VLDL Calculated: 22 mg/dL (ref 7–30)

## 2009-03-08 LAB — CBC
Hematocrit: 32.7 % — ABNORMAL LOW (ref 37.0–47.0)
Hgb: 10.4 G/DL — ABNORMAL LOW (ref 12.0–16.0)
MCH: 30.1 PG (ref 28.0–32.0)
MCHC: 31.8 G/DL — ABNORMAL LOW (ref 32.0–36.0)
MCV: 94.5 FL (ref 80.0–100.0)
MPV: 10.9 FL (ref 9.4–12.3)
Platelets: 213 /mm3 (ref 140–400)
RBC: 3.46 /mm3 — ABNORMAL LOW (ref 4.20–5.40)
RDW: 14.3 % (ref 11.5–15.0)
WBC: 7.24 /mm3 (ref 3.50–10.80)

## 2009-03-08 LAB — CALCIUM IONIZED-CALC. CERNER: Calcium Ionized Calculated: 1.8 mEQ/L — ABNORMAL LOW (ref 1.9–2.3)

## 2009-03-08 LAB — HEMOGLOBIN A1C: Hemoglobin A1C: 7.6 % — ABNORMAL HIGH (ref ?–6.0)

## 2009-03-08 LAB — URINALYSIS
Bilirubin, UA: NEGATIVE
Blood, UA: NEGATIVE
Glucose, UA: NEGATIVE
Ketones UA: NEGATIVE
Leukocyte Esterase, UA: NEGATIVE
Nitrite, UA: NEGATIVE
Protein, UR: NEGATIVE
Specific Gravity UA POCT: <=1.005 (ref <=1.030)
Urine pH: 6 (ref 5.0–8.0)
Urobilinogen, UA: 0.2

## 2009-03-08 LAB — GFR

## 2009-03-08 LAB — CH/HDL: Cholesterol / HDL Ratio: 3.8 RATIO

## 2009-03-08 LAB — SEDIMENTATION RATE: Sed Rate: 95 MM/HR — ABNORMAL HIGH (ref 0–20)

## 2009-03-25 ENCOUNTER — Inpatient Hospital Stay
Admission: EM | Admit: 2009-03-25 | Disposition: A | Discharge status: Home / Self Care | Payer: Self-pay | Source: Emergency Department | Admitting: Internal Medicine

## 2009-03-25 LAB — CBC AND DIFFERENTIAL
Basophils Absolute: 0 /mm3 (ref 0.0–0.2)
Basophils: 0 % (ref 0–2)
Eosinophils Absolute: 0 /mm3 (ref 0.0–0.7)
Eosinophils: 0 % (ref 0–5)
Granulocytes Absolute: 10.4 /mm3 — ABNORMAL HIGH (ref 1.8–8.1)
Hematocrit: 29.9 % — ABNORMAL LOW (ref 37.0–47.0)
Hgb: 9.6 G/DL — ABNORMAL LOW (ref 12.0–16.0)
Immature Granulocytes Absolute: 0 CUMM (ref 0.0–0.0)
Immature Granulocytes: 0 % (ref 0–1)
Lymphocytes Absolute: 0.5 /mm3 (ref 0.5–4.4)
Lymphocytes: 4 % — ABNORMAL LOW (ref 15–41)
MCH: 29.9 PG (ref 28.0–32.0)
MCHC: 32.1 G/DL (ref 32.0–36.0)
MCV: 93.1 FL (ref 80.0–100.0)
MPV: 10.9 FL (ref 9.4–12.3)
Monocytes Absolute: 0.2 /mm3 (ref 0.0–1.2)
Monocytes: 1 % (ref 0–11)
Neutrophils %: 94 % — ABNORMAL HIGH (ref 52–75)
Platelets: 205 /mm3 (ref 140–400)
RBC: 3.21 /mm3 — ABNORMAL LOW (ref 4.20–5.40)
RDW: 14.2 % (ref 11.5–15.0)
WBC: 11.07 /mm3 — ABNORMAL HIGH (ref 3.50–10.80)

## 2009-03-25 LAB — CALCIUM IONIZED-CALC. CERNER: Calcium Ionized Calculated: 1.8 mEQ/L — ABNORMAL LOW (ref 1.9–2.3)

## 2009-03-25 LAB — COMPREHENSIVE METABOLIC PANEL
ALT: 113 U/L — ABNORMAL HIGH (ref 21–72)
AST (SGOT): 180 U/L — ABNORMAL HIGH (ref 8–39)
Albumin/Globulin Ratio: 0.8 — ABNORMAL LOW (ref 1.1–1.8)
Albumin: 3.4 G/DL — ABNORMAL LOW (ref 3.7–5.1)
Alkaline Phosphatase: 299 U/L — ABNORMAL HIGH (ref 43–122)
BUN: 26 MG/DL — ABNORMAL HIGH (ref 7–21)
Bilirubin, Total: 0.4 MG/DL (ref 0.2–1.3)
CO2: 24 MEQ/L (ref 22–31)
Calcium: 8.8 MG/DL (ref 8.6–10.2)
Chloride: 105 MEQ/L (ref 98–107)
Creatinine: 1 MG/DL (ref 0.5–1.4)
Globulin: 4.2 G/DL — ABNORMAL HIGH (ref 2.0–3.7)
Glucose: 209 MG/DL — ABNORMAL HIGH (ref 70–105)
Potassium: 4.2 MEQ/L (ref 3.6–5.0)
Protein, Total: 7.6 G/DL (ref 6.0–8.0)
Sodium: 141 MEQ/L (ref 136–143)

## 2009-03-25 LAB — URINALYSIS
Bilirubin, UA: NEGATIVE
Glucose, UA: 250
Ketones UA: NEGATIVE
Leukocyte Esterase, UA: NEGATIVE
Nitrite, UA: NEGATIVE
Protein, UR: 30
Specific Gravity UA POCT: 1.025 (ref <=1.030)
Urine pH: 6 (ref 5.0–8.0)
Urobilinogen, UA: 0.2

## 2009-03-25 LAB — URINE MICROSCOPIC

## 2009-03-25 LAB — CREATINE KINASE W/O REFLEX (SOFT): Creatine Kinase (CK): 267 U/L — ABNORMAL HIGH (ref 20–140)

## 2009-03-25 LAB — TSH: TSH: 2.83 u[IU]/mL (ref 0.465–4.680)

## 2009-03-25 LAB — B-TYPE NATRIURETIC PEPTIDE: B-Natriuretic Peptide: 356 pg/mL — ABNORMAL HIGH (ref ?–100)

## 2009-03-25 LAB — GFR

## 2009-03-25 LAB — TROPONIN I QUANTITATIVE LEVEL CERNER: Troponin I: 0.06 ng/mL — ABNORMAL HIGH (ref 0.00–0.03)

## 2009-03-25 LAB — CKMB MASS CERNER: CKMB Mass: 1.7 NG/ML (ref 0.0–5.0)

## 2009-03-26 LAB — COMPREHENSIVE METABOLIC PANEL
ALT: 84 U/L — ABNORMAL HIGH (ref 21–72)
AST (SGOT): 83 U/L — ABNORMAL HIGH (ref 8–39)
Albumin/Globulin Ratio: 0.7 — ABNORMAL LOW (ref 1.1–1.8)
Albumin: 2.7 G/DL — ABNORMAL LOW (ref 3.7–5.1)
Alkaline Phosphatase: 229 U/L — ABNORMAL HIGH (ref 43–122)
BUN: 28 MG/DL — ABNORMAL HIGH (ref 7–21)
Bilirubin, Total: <0.1 MG/DL (ref 0.2–1.3)
CO2: 28 MEQ/L (ref 22–31)
Calcium: 8.4 MG/DL — ABNORMAL LOW (ref 8.6–10.2)
Chloride: 106 MEQ/L (ref 98–107)
Creatinine: 1.1 MG/DL (ref 0.5–1.4)
Globulin: 4 G/DL — ABNORMAL HIGH (ref 2.0–3.7)
Glucose: 124 MG/DL — ABNORMAL HIGH (ref 70–105)
Potassium: 4.7 MEQ/L (ref 3.6–5.0)
Protein, Total: 6.7 G/DL (ref 6.0–8.0)
Sodium: 140 MEQ/L (ref 136–143)

## 2009-03-26 LAB — TROPONIN I QUANTITATIVE LEVEL CERNER: Troponin I: 0.11 ng/mL — ABNORMAL HIGH (ref 0.00–0.03)

## 2009-03-26 LAB — CKMB MASS CERNER: CKMB Mass: 2.6 NG/ML (ref 0.0–5.0)

## 2009-03-26 LAB — CK: Creatine Kinase (CK): 261 U/L — ABNORMAL HIGH (ref 20–140)

## 2009-03-26 LAB — CALCIUM IONIZED-CALC. CERNER: Calcium Ionized Calculated: 1.9 mEQ/L (ref 1.9–2.3)

## 2009-03-26 LAB — GFR

## 2009-03-27 LAB — ALT: ALT: 70 U/L (ref 21–72)

## 2009-03-27 LAB — CBC
Hematocrit: 32.5 % — ABNORMAL LOW (ref 37.0–47.0)
Hgb: 10.4 G/DL — ABNORMAL LOW (ref 12.0–16.0)
MCH: 29.9 PG (ref 28.0–32.0)
MCHC: 32 G/DL (ref 32.0–36.0)
MCV: 93.4 FL (ref 80.0–100.0)
MPV: 11.6 FL (ref 9.4–12.3)
Platelets: 205 /mm3 (ref 140–400)
RBC: 3.48 /mm3 — ABNORMAL LOW (ref 4.20–5.40)
RDW: 14.3 % (ref 11.5–15.0)
WBC: 10.18 /mm3 (ref 3.50–10.80)

## 2009-03-27 LAB — AST: AST (SGOT): 51 U/L — ABNORMAL HIGH (ref 8–39)

## 2009-03-27 LAB — ALKALINE PHOSPHATASE: Alkaline Phosphatase: 228 U/L — ABNORMAL HIGH (ref 43–122)

## 2009-03-27 LAB — TROPONIN I QUANTITATIVE LEVEL CERNER: Troponin I: 0.06 ng/mL — ABNORMAL HIGH (ref 0.00–0.03)

## 2009-04-21 ENCOUNTER — Inpatient Hospital Stay
Admission: EM | Admit: 2009-04-21 | Disposition: A | Discharge status: Other Acute Inpatient Hospital | Payer: Self-pay | Source: Emergency Department | Admitting: Internal Medicine

## 2009-04-21 LAB — HEMOLYSIS INDEX: Hemolysis Index: 6

## 2009-04-21 LAB — CBC AND DIFFERENTIAL
Basophils %: 0 % (ref 0–2)
Basophils Absolute: 0 10*3/uL (ref 0.00–0.20)
Eosinophils %: 1 % (ref 0–5)
Eosinophils Absolute: 0.1 10*3/uL (ref 0.00–0.70)
Granulocytes Absolute: 9.1 10*3/uL — ABNORMAL HIGH (ref 1.80–8.10)
Hematocrit: 31.4 % — ABNORMAL LOW (ref 37.0–47.0)
Hgb: 10 g/dL — ABNORMAL LOW (ref 12.0–16.0)
Immature Granulocytes Absolute: 0.03 10*3/uL — ABNORMAL HIGH
Immature Granulocytes: 0 % (ref 0–1)
Lymphocytes Absolute: 1.5 10*3/uL (ref 0.50–4.40)
Lymphocytes: 13 % — ABNORMAL LOW (ref 15–41)
MCH: 29.7 pg (ref 28.0–32.0)
MCHC: 31.8 g/dL — ABNORMAL LOW (ref 32.0–36.0)
MCV: 93.2 fL (ref 80.0–100.0)
MPV: 10.6 fL (ref 9.4–12.3)
Monocytes Absolute: 1.4 10*3/uL — ABNORMAL HIGH (ref 0.00–1.20)
Monocytes: 11 % (ref 0–11)
Neutrophils %: 75 % (ref 52–75)
Platelets: 173 10*3/uL (ref 140–400)
RBC: 3.37 10*6/uL — ABNORMAL LOW (ref 4.20–5.40)
RDW: 15 % (ref 12–15)
WBC: 12.17 10*3/uL — ABNORMAL HIGH (ref 3.50–10.80)

## 2009-04-21 LAB — URINALYSIS, REFLEX TO MICROSCOPIC EXAM IF INDICATED
Bilirubin, UA: NEGATIVE
Glucose, UA: NEGATIVE
Ketones UA: NEGATIVE
Leukocyte Esterase, UA: NEGATIVE
Nitrite, UA: NEGATIVE
Protein, UR: 30 — AB
Specific Gravity UA POCT: 1.01 (ref 1.001–1.035)
Urine pH: 7 (ref 5.0–8.0)
Urobilinogen, UA: 4 mg/dL — AB

## 2009-04-21 LAB — COMPREHENSIVE METABOLIC PANEL
ALT: 191 U/L — ABNORMAL HIGH (ref 21–72)
AST (SGOT): 310 U/L — ABNORMAL HIGH (ref 8–39)
Albumin/Globulin Ratio: 0.7 — ABNORMAL LOW (ref 1.1–1.8)
Albumin: 2.7 g/dL — ABNORMAL LOW (ref 3.7–5.1)
Alkaline Phosphatase: 291 U/L — ABNORMAL HIGH (ref 43–122)
BUN: 28 mg/dL — ABNORMAL HIGH (ref 7–21)
Bilirubin, Total: 1 mg/dL (ref 0.2–1.3)
CO2: 30 mEq/L (ref 22–31)
Calcium: 8.3 mg/dL — ABNORMAL LOW (ref 8.6–10.2)
Chloride: 106 mEq/L (ref 98–107)
Creatinine: 1.1 mg/dL (ref 0.5–1.4)
Globulin: 3.9 g/dL — ABNORMAL HIGH (ref 2.0–3.7)
Glucose: 98 mg/dL (ref 70–100)
Potassium: 3.8 mEq/L (ref 3.6–5.0)
Protein, Total: 6.6 g/dL (ref 6.0–8.0)
Sodium: 142 mEq/L (ref 136–143)

## 2009-04-21 LAB — PT/INR
PT INR: 1.6 — ABNORMAL HIGH (ref 0.9–1.1)
PT: 17.3 s — ABNORMAL HIGH (ref 10.8–13.3)

## 2009-04-21 LAB — PT AND APTT
PT INR: 1.4 — ABNORMAL HIGH (ref 0.9–1.1)
PT: 16.2 s — ABNORMAL HIGH (ref 10.8–13.3)
PTT: 30 s (ref 21–32)

## 2009-04-21 LAB — LIPID PANEL
Cholesterol / HDL Ratio: 6 mg/dL (ref 0–200)
Cholesterol: 193 mg/dL (ref 0–199)
HDL: 30 mg/dL — ABNORMAL LOW (ref >40)
LDL Calculated: 133 mg/dL — ABNORMAL HIGH (ref 0–99)
Triglycerides: 148 mg/dL (ref 34–149)
VLDL Calculated: 30 mg/dL (ref 10–40)

## 2009-04-21 LAB — TROPONIN I
Troponin I: 0.17 ng/mL — CR (ref 0.00–0.03)
Troponin I: 0.14 ng/mL — CR (ref 0.00–0.03)
Troponin I: 0.11 ng/mL — ABNORMAL HIGH (ref 0.00–0.03)
Troponin I: 0.09 ng/mL — ABNORMAL HIGH (ref 0.00–0.03)

## 2009-04-21 LAB — RPR

## 2009-04-21 LAB — TSH: TSH: 0.998 u[IU]/mL (ref 0.465–4.680)

## 2009-04-21 LAB — GFR: EGFR: 56.3

## 2009-04-21 LAB — SEDIMENTATION RATE: Sed Rate: 89 mm/Hr — ABNORMAL HIGH (ref 0–20)

## 2009-04-22 LAB — CBC AND DIFFERENTIAL
Basophils %: 0 % (ref 0–2)
Basophils Absolute: 0 10*3/uL (ref 0.00–0.20)
Eosinophils %: 0 % (ref 0–5)
Eosinophils Absolute: 0 10*3/uL (ref 0.00–0.70)
Granulocytes Absolute: 10 10*3/uL — ABNORMAL HIGH (ref 1.80–8.10)
Hematocrit: 29.9 % — ABNORMAL LOW (ref 37.0–47.0)
Hgb: 9.8 g/dL — ABNORMAL LOW (ref 12.0–16.0)
Lymphocytes Absolute: 0.5 10*3/uL (ref 0.50–4.40)
Lymphocytes: 3 % — ABNORMAL LOW (ref 15–41)
MCH: 29.7 pg (ref 28.0–32.0)
MCHC: 32.8 g/dL (ref 32.0–36.0)
MCV: 90.6 fL (ref 80.0–100.0)
MPV: 12 fL (ref 9.4–12.3)
Monocytes Absolute: 1.3 10*3/uL — ABNORMAL HIGH (ref 0.00–1.20)
Monocytes: 8 % (ref 0–11)
Neutrophils %: 60 % (ref 52–75)
Platelets: 156 10*3/uL (ref 140–400)
RBC: 3.3 10*6/uL — ABNORMAL LOW (ref 4.20–5.40)
RDW: 15 % (ref 12–15)
WBC: 16.7 10*3/uL — ABNORMAL HIGH (ref 3.50–10.80)

## 2009-04-22 LAB — MAN DIFF ONLY
Atypical Lymphocytes %: 1 % — ABNORMAL HIGH
Atypical Lymphocytes Absolute: 0.17 10*3/uL — ABNORMAL HIGH
Band Neutrophils Absolute: 4.51 10*3/uL — ABNORMAL HIGH (ref 0.00–1.00)
Bands: 27 % — ABNORMAL HIGH (ref 0–9)
Metamyelocyte Abs Ca: 0.17 10*3/uL — ABNORMAL HIGH
Metamyelocytes: 1 % — ABNORMAL HIGH

## 2009-04-22 LAB — COMPREHENSIVE METABOLIC PANEL
ALT: 115 U/L — ABNORMAL HIGH (ref 21–72)
AST (SGOT): 136 U/L — ABNORMAL HIGH (ref 8–39)
Albumin/Globulin Ratio: 0.6 — ABNORMAL LOW (ref 1.1–1.8)
Albumin: 2.4 g/dL — ABNORMAL LOW (ref 3.7–5.1)
Alkaline Phosphatase: 313 U/L — ABNORMAL HIGH (ref 43–122)
BUN: 25 mg/dL — ABNORMAL HIGH (ref 7–21)
Bilirubin, Total: 1.6 mg/dL — ABNORMAL HIGH (ref 0.2–1.3)
CO2: 22 mEq/L (ref 22–31)
Calcium: 8 mg/dL — ABNORMAL LOW (ref 8.6–10.2)
Chloride: 102 mEq/L (ref 98–107)
Creatinine: 1.6 mg/dL — ABNORMAL HIGH (ref 0.5–1.4)
Globulin: 3.8 g/dL — ABNORMAL HIGH (ref 2.0–3.7)
Glucose: 238 mg/dL — ABNORMAL HIGH (ref 70–100)
Potassium: 3.8 mEq/L (ref 3.6–5.0)
Protein, Total: 6.2 g/dL (ref 6.0–8.0)
Sodium: 136 mEq/L (ref 136–143)

## 2009-04-22 LAB — CELL MORPHOLOGY: Cell Morphology: NORMAL

## 2009-04-22 LAB — GFR: EGFR: 36.5

## 2009-04-22 LAB — MAGNESIUM: Magnesium: 1.4 mg/dL — ABNORMAL LOW (ref 1.6–2.3)

## 2009-04-23 LAB — CBC AND DIFFERENTIAL
Basophils %: 0 % (ref 0–2)
Basophils Absolute: 0 10*3/uL (ref 0.00–0.20)
Eosinophils %: 1 % (ref 0–5)
Eosinophils Absolute: 0.2 10*3/uL (ref 0.00–0.70)
Granulocytes Absolute: 12.3 10*3/uL — ABNORMAL HIGH (ref 1.80–8.10)
Hematocrit: 31 % — ABNORMAL LOW (ref 37.0–47.0)
Hgb: 10.2 g/dL — ABNORMAL LOW (ref 12.0–16.0)
Immature Granulocytes Absolute: 0.09 10*3/uL — ABNORMAL HIGH
Immature Granulocytes: 1 % (ref 0–1)
Lymphocytes Absolute: 1.4 10*3/uL (ref 0.50–4.40)
Lymphocytes: 9 % — ABNORMAL LOW (ref 15–41)
MCH: 29.7 pg (ref 28.0–32.0)
MCHC: 32.9 g/dL (ref 32.0–36.0)
MCV: 90.4 fL (ref 80.0–100.0)
MPV: 12 fL (ref 9.4–12.3)
Monocytes Absolute: 1.8 10*3/uL — ABNORMAL HIGH (ref 0.00–1.20)
Monocytes: 11 % (ref 0–11)
Neutrophils %: 79 % — ABNORMAL HIGH (ref 52–75)
Platelets: 141 10*3/uL (ref 140–400)
RBC: 3.43 10*6/uL — ABNORMAL LOW (ref 4.20–5.40)
RDW: 15 % (ref 12–15)
WBC: 15.7 10*3/uL — ABNORMAL HIGH (ref 3.50–10.80)

## 2009-04-23 LAB — COMPREHENSIVE METABOLIC PANEL
ALT: 92 U/L — ABNORMAL HIGH (ref 21–72)
AST (SGOT): 104 U/L — ABNORMAL HIGH (ref 8–39)
Albumin/Globulin Ratio: 0.7 — ABNORMAL LOW (ref 1.1–1.8)
Albumin: 2.3 g/dL — ABNORMAL LOW (ref 3.7–5.1)
Alkaline Phosphatase: 239 U/L — ABNORMAL HIGH (ref 43–122)
BUN: 21 mg/dL (ref 7–21)
Bilirubin, Total: 0.8 mg/dL (ref 0.2–1.3)
CO2: 26 mEq/L (ref 22–31)
Calcium: 7.6 mg/dL — ABNORMAL LOW (ref 8.6–10.2)
Chloride: 108 mEq/L — ABNORMAL HIGH (ref 98–107)
Creatinine: 1 mg/dL (ref 0.5–1.4)
Globulin: 3.5 g/dL (ref 2.0–3.7)
Glucose: 81 mg/dL (ref 70–100)
Potassium: 3.9 mEq/L (ref 3.6–5.0)
Protein, Total: 5.8 g/dL — ABNORMAL LOW (ref 6.0–8.0)
Sodium: 141 mEq/L (ref 136–143)

## 2009-04-23 LAB — HEPATIC FUNCTION PANEL
Bilirubin Direct: 0.3 mg/dL (ref 0.0–0.3)
Bilirubin Indirect: 0.5 mg/dL (ref 0.0–1.1)

## 2009-04-23 LAB — GFR: EGFR: >60

## 2009-04-23 LAB — AMYLASE: Amylase: 37 U/L (ref 30–110)

## 2009-04-24 LAB — HEPATIC FUNCTION PANEL
ALT: 70 U/L (ref 21–72)
AST (SGOT): 58 U/L — ABNORMAL HIGH (ref 8–39)
Albumin/Globulin Ratio: 0.7 — ABNORMAL LOW (ref 1.1–1.8)
Albumin: 2.3 g/dL — ABNORMAL LOW (ref 3.7–5.1)
Alkaline Phosphatase: 238 U/L — ABNORMAL HIGH (ref 43–122)
Bilirubin Direct: 0 mg/dL (ref 0.0–0.3)
Bilirubin Indirect: 0.5 mg/dL (ref 0.0–1.1)
Bilirubin, Total: 0.4 mg/dL (ref 0.2–1.3)
Globulin: 3.5 g/dL (ref 2.0–3.7)
Protein, Total: 5.8 g/dL — ABNORMAL LOW (ref 6.0–8.0)

## 2009-04-25 LAB — INFLUENZA A H1N1 (2009) REAL-TIME RT-PCR(SOFT)

## 2009-04-26 LAB — CBC AND DIFFERENTIAL
Basophils %: 0 % (ref 0–2)
Basophils Absolute: 0 10*3/uL (ref 0.00–0.20)
Eosinophils %: 0 % (ref 0–5)
Eosinophils Absolute: 0 10*3/uL (ref 0.00–0.70)
Granulocytes Absolute: 12.4 10*3/uL — ABNORMAL HIGH (ref 1.80–8.10)
Hematocrit: 29 % — ABNORMAL LOW (ref 37.0–47.0)
Hgb: 9.4 g/dL — ABNORMAL LOW (ref 12.0–16.0)
Lymphocytes Absolute: 1.7 10*3/uL (ref 0.50–4.40)
Lymphocytes: 11 % — ABNORMAL LOW (ref 15–41)
MCH: 29.2 pg (ref 28.0–32.0)
MCHC: 32.4 g/dL (ref 32.0–36.0)
MCV: 90.1 fL (ref 80.0–100.0)
MPV: 11.7 fL (ref 9.4–12.3)
Monocytes Absolute: 0.9 10*3/uL (ref 0.00–1.20)
Monocytes: 6 % (ref 0–11)
Neutrophils %: 79 % — ABNORMAL HIGH (ref 52–75)
Platelets: 197 10*3/uL (ref 140–400)
RBC: 3.22 10*6/uL — ABNORMAL LOW (ref 4.20–5.40)
RDW: 15 % (ref 12–15)
WBC: 15.68 10*3/uL — ABNORMAL HIGH (ref 3.50–10.80)

## 2009-04-26 LAB — COMPREHENSIVE METABOLIC PANEL
ALT: 49 U/L (ref 21–72)
AST (SGOT): 29 U/L (ref 8–39)
Albumin/Globulin Ratio: 0.6 — ABNORMAL LOW (ref 1.1–1.8)
Albumin: 2.4 g/dL — ABNORMAL LOW (ref 3.7–5.1)
Alkaline Phosphatase: 225 U/L — ABNORMAL HIGH (ref 43–122)
BUN: 15 mg/dL (ref 7–21)
Bilirubin, Total: 0.6 mg/dL (ref 0.2–1.3)
CO2: 26 mEq/L (ref 22–31)
Calcium: 8.1 mg/dL — ABNORMAL LOW (ref 8.6–10.2)
Chloride: 107 mEq/L (ref 98–107)
Creatinine: 0.9 mg/dL (ref 0.5–1.4)
Globulin: 3.8 g/dL — ABNORMAL HIGH (ref 2.0–3.7)
Glucose: 167 mg/dL — ABNORMAL HIGH (ref 70–100)
Potassium: 4.2 mEq/L (ref 3.6–5.0)
Protein, Total: 6.2 g/dL (ref 6.0–8.0)
Sodium: 142 mEq/L (ref 136–143)

## 2009-04-26 LAB — URINALYSIS WITH MICROSCOPIC
Bilirubin, UA: NEGATIVE
Glucose, UA: NEGATIVE
Ketones UA: NEGATIVE
Leukocyte Esterase, UA: NEGATIVE
Nitrite, UA: NEGATIVE
Protein, UR: NEGATIVE
Specific Gravity UA POCT: 1.005 (ref 1.001–1.035)
Urine pH: 6 (ref 5.0–8.0)
Urobilinogen, UA: NORMAL mg/dL

## 2009-04-26 LAB — MAN DIFF ONLY
Atypical Lymphocytes %: 2 % — ABNORMAL HIGH
Atypical Lymphocytes Absolute: 0.31 10*3/uL — ABNORMAL HIGH
Band Neutrophils Absolute: 0.16 10*3/uL (ref 0.00–1.00)
Bands: 1 % (ref 0–9)
Metamyelocyte Abs Ca: 0.16 10*3/uL — ABNORMAL HIGH
Metamyelocytes: 1 % — ABNORMAL HIGH

## 2009-04-26 LAB — CELL MORPHOLOGY: Cell Morphology: NORMAL

## 2009-04-26 LAB — GFR: EGFR: >60

## 2009-04-27 LAB — CBC AND DIFFERENTIAL
Basophils %: 1 % (ref 0–2)
Basophils Absolute: 0.2 10*3/uL (ref 0.00–0.20)
Eosinophils %: 2 % (ref 0–5)
Eosinophils Absolute: 0.3 10*3/uL (ref 0.00–0.70)
Granulocytes Absolute: 13.4 10*3/uL — ABNORMAL HIGH (ref 1.80–8.10)
Hematocrit: 26.4 % — ABNORMAL LOW (ref 37.0–47.0)
Hgb: 8.7 g/dL — ABNORMAL LOW (ref 12.0–16.0)
Lymphocytes Absolute: 1.4 10*3/uL (ref 0.50–4.40)
Lymphocytes: 8 % — ABNORMAL LOW (ref 15–41)
MCH: 29.5 pg (ref 28.0–32.0)
MCHC: 33 g/dL (ref 32.0–36.0)
MCV: 89.5 fL (ref 80.0–100.0)
MPV: 11.8 fL (ref 9.4–12.3)
Monocytes Absolute: 1 10*3/uL (ref 0.00–1.20)
Monocytes: 6 % (ref 0–11)
Neutrophils %: 78 % — ABNORMAL HIGH (ref 52–75)
Platelets: 216 10*3/uL (ref 140–400)
RBC: 2.95 10*6/uL — ABNORMAL LOW (ref 4.20–5.40)
RDW: 15 % (ref 12–15)
WBC: 17.21 10*3/uL — ABNORMAL HIGH (ref 3.50–10.80)

## 2009-04-27 LAB — MAN DIFF ONLY
Atypical Lymphocytes %: 1 % — ABNORMAL HIGH
Atypical Lymphocytes Absolute: 0.17 10*3/uL — ABNORMAL HIGH
Band Neutrophils Absolute: 0.52 10*3/uL (ref 0.00–1.00)
Bands: 3 % (ref 0–9)
Metamyelocyte Abs Ca: 0.17 10*3/uL — ABNORMAL HIGH
Metamyelocytes: 1 % — ABNORMAL HIGH

## 2009-04-27 LAB — CELL MORPHOLOGY: Cell Morphology: ABNORMAL — AB

## 2009-04-27 LAB — GFR: EGFR: >60

## 2009-04-27 LAB — COMPREHENSIVE METABOLIC PANEL
ALT: 40 U/L (ref 21–72)
AST (SGOT): 23 U/L (ref 8–39)
Albumin/Globulin Ratio: 0.6 — ABNORMAL LOW (ref 1.1–1.8)
Albumin: 2.2 g/dL — ABNORMAL LOW (ref 3.7–5.1)
Alkaline Phosphatase: 196 U/L — ABNORMAL HIGH (ref 43–122)
BUN: 15 mg/dL (ref 7–21)
Bilirubin, Total: 0.3 mg/dL (ref 0.2–1.3)
CO2: 27 mEq/L (ref 22–31)
Calcium: 8.1 mg/dL — ABNORMAL LOW (ref 8.6–10.2)
Chloride: 106 mEq/L (ref 98–107)
Creatinine: 0.8 mg/dL (ref 0.5–1.4)
Globulin: 3.6 g/dL (ref 2.0–3.7)
Glucose: 147 mg/dL — ABNORMAL HIGH (ref 70–100)
Potassium: 3.6 mEq/L (ref 3.6–5.0)
Protein, Total: 5.8 g/dL — ABNORMAL LOW (ref 6.0–8.0)
Sodium: 139 mEq/L (ref 136–143)

## 2009-04-27 LAB — PREALBUMIN: Prealbumin: 7 mg/dL — ABNORMAL LOW (ref 16.0–38.0)

## 2009-04-27 LAB — SEDIMENTATION RATE: Sed Rate: >120 mm/Hr — ABNORMAL HIGH (ref 0–20)

## 2009-04-27 LAB — CORTISOL: Cortisol: 2.7 ug/dL

## 2009-04-27 LAB — LIPASE: Lipase: 128 U/L (ref 23–300)

## 2009-04-27 LAB — AMYLASE: Amylase: 59 U/L (ref 30–110)

## 2009-04-28 LAB — MAN DIFF ONLY
Band Neutrophils Absolute: 0.15 10*3/uL (ref 0.00–1.00)
Bands: 1 % (ref 0–9)
Myelocyte Abs Calcul: 0.15 10*3/uL — ABNORMAL HIGH
Myelocytes: 1 % — ABNORMAL HIGH

## 2009-04-28 LAB — CBC AND DIFFERENTIAL
Basophils %: 0 % (ref 0–2)
Basophils Absolute: 0 10*3/uL (ref 0.00–0.20)
Eosinophils %: 0 % (ref 0–5)
Eosinophils Absolute: 0 10*3/uL (ref 0.00–0.70)
Granulocytes Absolute: 12.2 10*3/uL — ABNORMAL HIGH (ref 1.80–8.10)
Hematocrit: 26 % — ABNORMAL LOW (ref 37.0–47.0)
Hgb: 8.4 g/dL — ABNORMAL LOW (ref 12.0–16.0)
Lymphocytes Absolute: 1.6 10*3/uL (ref 0.50–4.40)
Lymphocytes: 11 % — ABNORMAL LOW (ref 15–41)
MCH: 29 pg (ref 28.0–32.0)
MCHC: 32.3 g/dL (ref 32.0–36.0)
MCV: 89.7 fL (ref 80.0–100.0)
MPV: 11.5 fL (ref 9.4–12.3)
Monocytes Absolute: 0.4 10*3/uL (ref 0.00–1.20)
Monocytes: 3 % (ref 0–11)
Neutrophils %: 84 % — ABNORMAL HIGH (ref 52–75)
Platelets: 278 10*3/uL (ref 140–400)
RBC: 2.9 10*6/uL — ABNORMAL LOW (ref 4.20–5.40)
RDW: 15 % (ref 12–15)
WBC: 14.53 10*3/uL — ABNORMAL HIGH (ref 3.50–10.80)

## 2009-04-28 LAB — COMPREHENSIVE METABOLIC PANEL
ALT: 39 U/L (ref 21–72)
AST (SGOT): 27 U/L (ref 8–39)
Albumin/Globulin Ratio: 0.6 — ABNORMAL LOW (ref 1.1–1.8)
Albumin: 2.3 g/dL — ABNORMAL LOW (ref 3.7–5.1)
Alkaline Phosphatase: 201 U/L — ABNORMAL HIGH (ref 43–122)
BUN: 12 mg/dL (ref 7–21)
Bilirubin, Total: 0.3 mg/dL (ref 0.2–1.3)
CO2: 25 mEq/L (ref 22–31)
Calcium: 8.2 mg/dL — ABNORMAL LOW (ref 8.6–10.2)
Chloride: 105 mEq/L (ref 98–107)
Creatinine: 0.8 mg/dL (ref 0.5–1.4)
Globulin: 3.6 g/dL (ref 2.0–3.7)
Glucose: 92 mg/dL (ref 70–100)
Potassium: 3.5 mEq/L — ABNORMAL LOW (ref 3.6–5.0)
Protein, Total: 5.9 g/dL — ABNORMAL LOW (ref 6.0–8.0)
Sodium: 140 mEq/L (ref 136–143)

## 2009-04-28 LAB — B-TYPE NATRIURETIC PEPTIDE: B-Natriuretic Peptide: 252 pg/mL — ABNORMAL HIGH (ref 0–100)

## 2009-04-28 LAB — CELL MORPHOLOGY: Cell Morphology: ABNORMAL — AB

## 2009-04-28 LAB — GFR: EGFR: >60

## 2009-04-29 LAB — CBC WITH MANUAL DIFFERENTIAL
Basophils %: 0 % (ref 0–2)
Basophils Absolute: 0 10*3/uL (ref 0.00–0.20)
Cell Morphology: ABNORMAL — AB
Eosinophils %: 1 % (ref 0–5)
Eosinophils Absolute: 0.2 10*3/uL (ref 0.00–0.70)
Granulocytes Absolute: 12.6 10*3/uL — ABNORMAL HIGH (ref 1.80–8.10)
Hematocrit: 25.5 % — ABNORMAL LOW (ref 37.0–47.0)
Hgb: 8.4 g/dL — ABNORMAL LOW (ref 12.0–16.0)
Lymphocytes Absolute: 1.7 10*3/uL (ref 0.50–4.40)
Lymphocytes: 11 % — ABNORMAL LOW (ref 15–41)
MCH: 29.7 pg (ref 28.0–32.0)
MCHC: 32.9 g/dL (ref 32.0–36.0)
MCV: 90.1 fL (ref 80.0–100.0)
MPV: 10.9 fL (ref 9.4–12.3)
Monocytes Absolute: 0.8 10*3/uL (ref 0.00–1.20)
Monocytes: 5 % (ref 0–11)
Neutrophils %: 83 % — ABNORMAL HIGH (ref 52–75)
Platelets: 298 10*3/uL (ref 140–400)
RBC: 2.83 10*6/uL — ABNORMAL LOW (ref 4.20–5.40)
RDW: 15 % (ref 12–15)
WBC: 15.13 10*3/uL — ABNORMAL HIGH (ref 3.50–10.80)

## 2009-04-29 LAB — COMPREHENSIVE METABOLIC PANEL
ALT: 32 U/L (ref 21–72)
AST (SGOT): 24 U/L (ref 8–39)
Albumin/Globulin Ratio: 0.6 — ABNORMAL LOW (ref 1.1–1.8)
Albumin: 2.3 g/dL — ABNORMAL LOW (ref 3.7–5.1)
Alkaline Phosphatase: 197 U/L — ABNORMAL HIGH (ref 43–122)
BUN: 17 mg/dL (ref 7–21)
Bilirubin, Total: 0.2 mg/dL (ref 0.2–1.3)
CO2: 27 mEq/L (ref 22–31)
Calcium: 8.2 mg/dL — ABNORMAL LOW (ref 8.6–10.2)
Chloride: 104 mEq/L (ref 98–107)
Creatinine: 1.1 mg/dL (ref 0.5–1.4)
Globulin: 3.8 g/dL — ABNORMAL HIGH (ref 2.0–3.7)
Glucose: 143 mg/dL — ABNORMAL HIGH (ref 70–100)
Potassium: 4.1 mEq/L (ref 3.6–5.0)
Protein, Total: 6.1 g/dL (ref 6.0–8.0)
Sodium: 140 mEq/L (ref 136–143)

## 2009-04-29 LAB — URINALYSIS WITH MICROSCOPIC
Bilirubin, UA: NEGATIVE
Glucose, UA: 500 — AB
Ketones UA: NEGATIVE
Leukocyte Esterase, UA: NEGATIVE
Nitrite, UA: NEGATIVE
Protein, UR: 30 — AB
Specific Gravity UA POCT: 1.01 (ref 1.001–1.035)
Urine pH: 6 (ref 5.0–8.0)
Urobilinogen, UA: NORMAL mg/dL

## 2009-04-29 LAB — GFR: EGFR: 56.3

## 2009-04-30 LAB — CBC AND DIFFERENTIAL
Basophils %: 0 % (ref 0–2)
Basophils Absolute: 0 10*3/uL (ref 0.00–0.20)
Eosinophils %: 0 % (ref 0–5)
Eosinophils Absolute: 0 10*3/uL (ref 0.00–0.70)
Granulocytes Absolute: 12.4 10*3/uL — ABNORMAL HIGH (ref 1.80–8.10)
Hematocrit: 25.8 % — ABNORMAL LOW (ref 37.0–47.0)
Hgb: 8.3 g/dL — ABNORMAL LOW (ref 12.0–16.0)
Lymphocytes Absolute: 1.3 10*3/uL (ref 0.50–4.40)
Lymphocytes: 9 % — ABNORMAL LOW (ref 15–41)
MCH: 29.1 pg (ref 28.0–32.0)
MCHC: 32.2 g/dL (ref 32.0–36.0)
MCV: 90.5 fL (ref 80.0–100.0)
MPV: 11.2 fL (ref 9.4–12.3)
Monocytes Absolute: 0.4 10*3/uL (ref 0.00–1.20)
Monocytes: 3 % (ref 0–11)
Neutrophils %: 87 % — ABNORMAL HIGH (ref 52–75)
Platelets: 345 10*3/uL (ref 140–400)
RBC: 2.85 10*6/uL — ABNORMAL LOW (ref 4.20–5.40)
RDW: 15 % (ref 12–15)
WBC: 14.26 10*3/uL — ABNORMAL HIGH (ref 3.50–10.80)

## 2009-04-30 LAB — COMPREHENSIVE METABOLIC PANEL
ALT: 29 U/L (ref 21–72)
AST (SGOT): 25 U/L (ref 8–39)
Albumin/Globulin Ratio: 0.7 — ABNORMAL LOW (ref 1.1–1.8)
Albumin: 2.5 g/dL — ABNORMAL LOW (ref 3.7–5.1)
Alkaline Phosphatase: 180 U/L — ABNORMAL HIGH (ref 43–122)
BUN: 16 mg/dL (ref 7–21)
Bilirubin, Total: 0.2 mg/dL (ref 0.2–1.3)
CO2: 27 mEq/L (ref 22–31)
Calcium: 8.4 mg/dL — ABNORMAL LOW (ref 8.6–10.2)
Chloride: 103 mEq/L (ref 98–107)
Creatinine: 0.9 mg/dL (ref 0.5–1.4)
Globulin: 3.8 g/dL — ABNORMAL HIGH (ref 2.0–3.7)
Glucose: 107 mg/dL — ABNORMAL HIGH (ref 70–100)
Potassium: 3.9 mEq/L (ref 3.6–5.0)
Protein, Total: 6.3 g/dL (ref 6.0–8.0)
Sodium: 141 mEq/L (ref 136–143)

## 2009-04-30 LAB — MAN DIFF ONLY
Band Neutrophils Absolute: 0.14 10*3/uL (ref 0.00–1.00)
Bands: 1 % (ref 0–9)

## 2009-04-30 LAB — GFR: EGFR: >60

## 2009-04-30 LAB — CELL MORPHOLOGY: Cell Morphology: ABNORMAL — AB

## 2009-05-01 LAB — COMPREHENSIVE METABOLIC PANEL
ALT: 28 U/L (ref 21–72)
AST (SGOT): 27 U/L (ref 8–39)
Albumin/Globulin Ratio: 0.6 — ABNORMAL LOW (ref 1.1–1.8)
Albumin: 2.5 g/dL — ABNORMAL LOW (ref 3.7–5.1)
Alkaline Phosphatase: 189 U/L — ABNORMAL HIGH (ref 43–122)
BUN: 15 mg/dL (ref 7–21)
Bilirubin, Total: <0.1 mg/dL — ABNORMAL LOW (ref 0.2–1.3)
CO2: 28 mEq/L (ref 22–31)
Calcium: 8.3 mg/dL — ABNORMAL LOW (ref 8.6–10.2)
Chloride: 104 mEq/L (ref 98–107)
Creatinine: 0.9 mg/dL (ref 0.5–1.4)
Globulin: 3.9 g/dL — ABNORMAL HIGH (ref 2.0–3.7)
Glucose: 94 mg/dL (ref 70–100)
Potassium: 3.9 mEq/L (ref 3.6–5.0)
Protein, Total: 6.4 g/dL (ref 6.0–8.0)
Sodium: 141 mEq/L (ref 136–143)

## 2009-05-01 LAB — CBC AND DIFFERENTIAL
Basophils %: 0 % (ref 0–2)
Basophils Absolute: 0 10*3/uL (ref 0.00–0.20)
Eosinophils %: 1 % (ref 0–5)
Eosinophils Absolute: 0.2 10*3/uL (ref 0.00–0.70)
Granulocytes Absolute: 11.2 10*3/uL — ABNORMAL HIGH (ref 1.80–8.10)
Hematocrit: 26.4 % — ABNORMAL LOW (ref 37.0–47.0)
Hgb: 8.4 g/dL — ABNORMAL LOW (ref 12.0–16.0)
Immature Granulocytes Absolute: 0.13 10*3/uL — ABNORMAL HIGH
Immature Granulocytes: 1 % (ref 0–1)
Lymphocytes Absolute: 1.7 10*3/uL (ref 0.50–4.40)
Lymphocytes: 12 % — ABNORMAL LOW (ref 15–41)
MCH: 29 pg (ref 28.0–32.0)
MCHC: 31.8 g/dL — ABNORMAL LOW (ref 32.0–36.0)
MCV: 91 fL (ref 80.0–100.0)
MPV: 10.9 fL (ref 9.4–12.3)
Monocytes Absolute: 1 10*3/uL (ref 0.00–1.20)
Monocytes: 7 % (ref 0–11)
Neutrophils %: 80 % — ABNORMAL HIGH (ref 52–75)
Platelets: 374 10*3/uL (ref 140–400)
RBC: 2.9 10*6/uL — ABNORMAL LOW (ref 4.20–5.40)
RDW: 15 % (ref 12–15)
WBC: 14.13 10*3/uL — ABNORMAL HIGH (ref 3.50–10.80)

## 2009-05-01 LAB — GFR: EGFR: >60

## 2009-05-02 LAB — COMPREHENSIVE METABOLIC PANEL
ALT: 32 U/L (ref 21–72)
AST (SGOT): 25 U/L (ref 8–39)
Albumin/Globulin Ratio: 0.6 — ABNORMAL LOW (ref 1.1–1.8)
Albumin: 2.6 g/dL — ABNORMAL LOW (ref 3.7–5.1)
Alkaline Phosphatase: 185 U/L — ABNORMAL HIGH (ref 43–122)
BUN: 16 mg/dL (ref 7–21)
Bilirubin, Total: <0.1 mg/dL — ABNORMAL LOW (ref 0.2–1.3)
CO2: 30 mEq/L (ref 22–31)
Calcium: 8.7 mg/dL (ref 8.6–10.2)
Chloride: 104 mEq/L (ref 98–107)
Creatinine: 0.9 mg/dL (ref 0.5–1.4)
Globulin: 4.1 g/dL — ABNORMAL HIGH (ref 2.0–3.7)
Glucose: 131 mg/dL — ABNORMAL HIGH (ref 70–100)
Potassium: 4.1 mEq/L (ref 3.6–5.0)
Protein, Total: 6.7 g/dL (ref 6.0–8.0)
Sodium: 142 mEq/L (ref 136–143)

## 2009-05-02 LAB — CBC WITH MANUAL DIFFERENTIAL
Basophils %: 0 % (ref 0–2)
Basophils Absolute: 0 10*3/uL (ref 0.00–0.20)
Cell Morphology: ABNORMAL — AB
Eosinophils %: 1 % (ref 0–5)
Eosinophils Absolute: 0.1 10*3/uL (ref 0.00–0.70)
Granulocytes Absolute: 9.6 10*3/uL — ABNORMAL HIGH (ref 1.80–8.10)
Hematocrit: 26.7 % — ABNORMAL LOW (ref 37.0–47.0)
Hgb: 8.5 g/dL — ABNORMAL LOW (ref 12.0–16.0)
Lymphocytes Absolute: 1.1 10*3/uL (ref 0.50–4.40)
Lymphocytes: 9 % — ABNORMAL LOW (ref 15–41)
MCH: 29.1 pg (ref 28.0–32.0)
MCHC: 31.8 g/dL — ABNORMAL LOW (ref 32.0–36.0)
MCV: 91.4 fL (ref 80.0–100.0)
MPV: 10.8 fL (ref 9.4–12.3)
Monocytes Absolute: 1.5 10*3/uL — ABNORMAL HIGH (ref 0.00–1.20)
Monocytes: 12 % — ABNORMAL HIGH (ref 0–11)
Neutrophils %: 78 % — ABNORMAL HIGH (ref 52–75)
Platelets: 394 10*3/uL (ref 140–400)
RBC: 2.92 10*6/uL — ABNORMAL LOW (ref 4.20–5.40)
RDW: 15 % (ref 12–15)
WBC: 12.32 10*3/uL — ABNORMAL HIGH (ref 3.50–10.80)

## 2009-05-02 LAB — C-REACTIVE PROTEIN: C-Reactive Protein: 3.5 mg/dL — ABNORMAL HIGH (ref 0.0–0.8)

## 2009-05-02 LAB — GFR: EGFR: >60

## 2009-05-02 LAB — LIPASE: Lipase: 115 U/L (ref 23–300)

## 2009-05-02 LAB — SEDIMENTATION RATE: Sed Rate: >120 mm/Hr — ABNORMAL HIGH (ref 0–20)

## 2009-05-03 LAB — CBC AND DIFFERENTIAL
Basophils %: 0 % (ref 0–2)
Basophils Absolute: 0 10*3/uL (ref 0.00–0.20)
Eosinophils %: 2 % (ref 0–5)
Eosinophils Absolute: 0.2 10*3/uL (ref 0.00–0.70)
Granulocytes Absolute: 7.8 10*3/uL (ref 1.80–8.10)
Hematocrit: 27.1 % — ABNORMAL LOW (ref 37.0–47.0)
Hgb: 8.7 g/dL — ABNORMAL LOW (ref 12.0–16.0)
Immature Granulocytes Absolute: 0.1 10*3/uL — ABNORMAL HIGH
Immature Granulocytes: 1 % (ref 0–1)
Lymphocytes Absolute: 2.2 10*3/uL (ref 0.50–4.40)
Lymphocytes: 19 % (ref 15–41)
MCH: 29.6 pg (ref 28.0–32.0)
MCHC: 32.1 g/dL (ref 32.0–36.0)
MCV: 92.2 fL (ref 80.0–100.0)
MPV: 10.5 fL (ref 9.4–12.3)
Monocytes Absolute: 0.9 10*3/uL (ref 0.00–1.20)
Monocytes: 8 % (ref 0–11)
Neutrophils %: 70 % (ref 52–75)
Platelets: 427 10*3/uL — ABNORMAL HIGH (ref 140–400)
RBC: 2.94 10*6/uL — ABNORMAL LOW (ref 4.20–5.40)
RDW: 15 % (ref 12–15)
WBC: 11.13 10*3/uL — ABNORMAL HIGH (ref 3.50–10.80)

## 2009-05-03 LAB — COMPREHENSIVE METABOLIC PANEL
ALT: 32 U/L (ref 21–72)
AST (SGOT): 27 U/L (ref 8–39)
Albumin/Globulin Ratio: 0.6 — ABNORMAL LOW (ref 1.1–1.8)
Albumin: 2.7 g/dL — ABNORMAL LOW (ref 3.7–5.1)
Alkaline Phosphatase: 202 U/L — ABNORMAL HIGH (ref 43–122)
BUN: 18 mg/dL (ref 7–21)
Bilirubin, Total: <0.1 mg/dL — ABNORMAL LOW (ref 0.2–1.3)
CO2: 28 mEq/L (ref 22–31)
Calcium: 8.8 mg/dL (ref 8.6–10.2)
Chloride: 103 mEq/L (ref 98–107)
Creatinine: 0.9 mg/dL (ref 0.5–1.4)
Globulin: 4.2 g/dL — ABNORMAL HIGH (ref 2.0–3.7)
Glucose: 94 mg/dL (ref 70–100)
Potassium: 4 mEq/L (ref 3.6–5.0)
Protein, Total: 6.9 g/dL (ref 6.0–8.0)
Sodium: 142 mEq/L (ref 136–143)

## 2009-05-03 LAB — GFR: EGFR: >60

## 2009-05-04 LAB — COMPREHENSIVE METABOLIC PANEL
ALT: 31 U/L (ref 21–72)
AST (SGOT): 29 U/L (ref 8–39)
Albumin/Globulin Ratio: 0.7 — ABNORMAL LOW (ref 1.1–1.8)
Albumin: 2.8 g/dL — ABNORMAL LOW (ref 3.7–5.1)
Alkaline Phosphatase: 195 U/L — ABNORMAL HIGH (ref 43–122)
BUN: 20 mg/dL (ref 7–21)
Bilirubin, Total: 0.1 mg/dL — ABNORMAL LOW (ref 0.2–1.3)
CO2: 26 mEq/L (ref 22–31)
Calcium: 8.7 mg/dL (ref 8.6–10.2)
Chloride: 105 mEq/L (ref 98–107)
Creatinine: 0.9 mg/dL (ref 0.5–1.4)
Globulin: 4.3 g/dL — ABNORMAL HIGH (ref 2.0–3.7)
Glucose: 140 mg/dL — ABNORMAL HIGH (ref 70–100)
Potassium: 4.2 mEq/L (ref 3.6–5.0)
Protein, Total: 7.1 g/dL (ref 6.0–8.0)
Sodium: 140 mEq/L (ref 136–143)

## 2009-05-04 LAB — CBC AND DIFFERENTIAL
Basophils %: 0 % (ref 0–2)
Basophils Absolute: 0 10*3/uL (ref 0.00–0.20)
Eosinophils %: 2 % (ref 0–5)
Eosinophils Absolute: 0.2 10*3/uL (ref 0.00–0.70)
Granulocytes Absolute: 8.5 10*3/uL — ABNORMAL HIGH (ref 1.80–8.10)
Hematocrit: 27.6 % — ABNORMAL LOW (ref 37.0–47.0)
Hgb: 8.7 g/dL — ABNORMAL LOW (ref 12.0–16.0)
Lymphocytes Absolute: 2 10*3/uL (ref 0.50–4.40)
Lymphocytes: 17 % (ref 15–41)
MCH: 29.2 pg (ref 28.0–32.0)
MCHC: 31.5 g/dL — ABNORMAL LOW (ref 32.0–36.0)
MCV: 92.6 fL (ref 80.0–100.0)
MPV: 10.4 fL (ref 9.4–12.3)
Monocytes Absolute: 0.5 10*3/uL (ref 0.00–1.20)
Monocytes: 4 % (ref 0–11)
Neutrophils %: 74 % (ref 52–75)
Platelets: 451 10*3/uL — ABNORMAL HIGH (ref 140–400)
RBC: 2.98 10*6/uL — ABNORMAL LOW (ref 4.20–5.40)
RDW: 16 % — ABNORMAL HIGH (ref 12–15)
WBC: 11.52 10*3/uL — ABNORMAL HIGH (ref 3.50–10.80)

## 2009-05-04 LAB — MAN DIFF ONLY
Atypical Lymphocytes %: 3 % — ABNORMAL HIGH
Atypical Lymphocytes Absolute: 0.35 10*3/uL — ABNORMAL HIGH

## 2009-05-04 LAB — CELL MORPHOLOGY: Cell Morphology: NORMAL

## 2009-05-04 LAB — GFR: EGFR: >60

## 2009-05-05 LAB — CBC AND DIFFERENTIAL
Basophils %: 0 % (ref 0–2)
Basophils Absolute: 0 10*3/uL (ref 0.00–0.20)
Eosinophils %: 1 % (ref 0–5)
Eosinophils Absolute: 0.1 10*3/uL (ref 0.00–0.70)
Granulocytes Absolute: 6.9 10*3/uL (ref 1.80–8.10)
Hematocrit: 27.2 % — ABNORMAL LOW (ref 37.0–47.0)
Hgb: 8.5 g/dL — ABNORMAL LOW (ref 12.0–16.0)
Immature Granulocytes Absolute: 0.08 10*3/uL — ABNORMAL HIGH
Immature Granulocytes: 1 % (ref 0–1)
Lymphocytes Absolute: 2.1 10*3/uL (ref 0.50–4.40)
Lymphocytes: 21 % (ref 15–41)
MCH: 29 pg (ref 28.0–32.0)
MCHC: 31.3 g/dL — ABNORMAL LOW (ref 32.0–36.0)
MCV: 92.8 fL (ref 80.0–100.0)
MPV: 10.6 fL (ref 9.4–12.3)
Monocytes Absolute: 0.8 10*3/uL (ref 0.00–1.20)
Monocytes: 8 % (ref 0–11)
Neutrophils %: 69 % (ref 52–75)
Platelets: 426 10*3/uL — ABNORMAL HIGH (ref 140–400)
RBC: 2.93 10*6/uL — ABNORMAL LOW (ref 4.20–5.40)
RDW: 15 % (ref 12–15)
WBC: 9.91 10*3/uL (ref 3.50–10.80)

## 2009-05-05 LAB — COMPREHENSIVE METABOLIC PANEL
ALT: 28 U/L (ref 21–72)
AST (SGOT): 27 U/L (ref 8–39)
Albumin/Globulin Ratio: 0.7 — ABNORMAL LOW (ref 1.1–1.8)
Albumin: 2.8 g/dL — ABNORMAL LOW (ref 3.7–5.1)
Alkaline Phosphatase: 194 U/L — ABNORMAL HIGH (ref 43–122)
BUN: 22 mg/dL — ABNORMAL HIGH (ref 7–21)
Bilirubin, Total: <0.1 mg/dL — ABNORMAL LOW (ref 0.2–1.3)
CO2: 28 mEq/L (ref 22–31)
Calcium: 8.6 mg/dL (ref 8.6–10.2)
Chloride: 103 mEq/L (ref 98–107)
Creatinine: 1 mg/dL (ref 0.5–1.4)
Globulin: 4.2 g/dL — ABNORMAL HIGH (ref 2.0–3.7)
Glucose: 181 mg/dL — ABNORMAL HIGH (ref 70–100)
Potassium: 4.4 mEq/L (ref 3.6–5.0)
Protein, Total: 7 g/dL (ref 6.0–8.0)
Sodium: 141 mEq/L (ref 136–143)

## 2009-05-05 LAB — GFR: EGFR: >60

## 2009-05-06 ENCOUNTER — Inpatient Hospital Stay
Admission: RE | Admit: 2009-05-06 | Disposition: A | Discharge status: Home / Self Care | Payer: Self-pay | Source: Other Acute Inpatient Hospital | Admitting: Physical Medicine & Rehabilitation

## 2009-05-06 LAB — COMPREHENSIVE METABOLIC PANEL
ALT: 29 U/L (ref 21–72)
AST (SGOT): 29 U/L (ref 8–39)
Albumin/Globulin Ratio: 0.7 — ABNORMAL LOW (ref 1.1–1.8)
Albumin: 2.9 g/dL — ABNORMAL LOW (ref 3.7–5.1)
Alkaline Phosphatase: 197 U/L — ABNORMAL HIGH (ref 43–122)
BUN: 22 mg/dL — ABNORMAL HIGH (ref 7–21)
Bilirubin, Total: <0.1 mg/dL — ABNORMAL LOW (ref 0.2–1.3)
CO2: 28 mEq/L (ref 22–31)
Calcium: 9 mg/dL (ref 8.6–10.2)
Chloride: 104 mEq/L (ref 98–107)
Creatinine: 1.3 mg/dL (ref 0.5–1.4)
Globulin: 4.4 g/dL — ABNORMAL HIGH (ref 2.0–3.7)
Glucose: 70 mg/dL (ref 70–100)
Potassium: 4.4 mEq/L (ref 3.6–5.0)
Protein, Total: 7.3 g/dL (ref 6.0–8.0)
Sodium: 142 mEq/L (ref 136–143)

## 2009-05-06 LAB — CBC AND DIFFERENTIAL
Basophils %: 0 % (ref 0–2)
Basophils Absolute: 0 10*3/uL (ref 0.00–0.20)
Eosinophils %: 1 % (ref 0–5)
Eosinophils Absolute: 0.1 10*3/uL (ref 0.00–0.70)
Granulocytes Absolute: 8 10*3/uL (ref 1.80–8.10)
Hematocrit: 28.7 % — ABNORMAL LOW (ref 37.0–47.0)
Hgb: 9.1 g/dL — ABNORMAL LOW (ref 12.0–16.0)
Immature Granulocytes Absolute: 0.05 10*3/uL
Immature Granulocytes: 1 % (ref 0–1)
Lymphocytes Absolute: 1.8 10*3/uL (ref 0.50–4.40)
Lymphocytes: 17 % (ref 15–41)
MCH: 29.4 pg (ref 28.0–32.0)
MCHC: 31.7 g/dL — ABNORMAL LOW (ref 32.0–36.0)
MCV: 92.9 fL (ref 80.0–100.0)
MPV: 10.4 fL (ref 9.4–12.3)
Monocytes Absolute: 0.9 10*3/uL (ref 0.00–1.20)
Monocytes: 8 % (ref 0–11)
Neutrophils %: 74 % (ref 52–75)
Platelets: 458 10*3/uL — ABNORMAL HIGH (ref 140–400)
RBC: 3.09 10*6/uL — ABNORMAL LOW (ref 4.20–5.40)
RDW: 16 % — ABNORMAL HIGH (ref 12–15)
WBC: 10.87 10*3/uL — ABNORMAL HIGH (ref 3.50–10.80)

## 2009-05-06 LAB — GFR: EGFR: 46.4

## 2009-05-07 LAB — URINALYSIS, REFLEX TO MICROSCOPIC EXAM IF INDICATED
Bilirubin, UA: NEGATIVE
Blood, UA: NEGATIVE
Glucose, UA: 50 — AB
Ketones UA: NEGATIVE
Nitrite, UA: NEGATIVE
Protein, UR: 30 — AB
Specific Gravity UA POCT: 1.005 (ref 1.001–1.035)
Urine pH: 6 (ref 5.0–8.0)
Urobilinogen, UA: NORMAL mg/dL

## 2009-05-09 LAB — COMPREHENSIVE METABOLIC PANEL
ALT: 22 U/L (ref 21–72)
AST (SGOT): 26 U/L (ref 8–39)
Albumin/Globulin Ratio: 0.7 — ABNORMAL LOW (ref 1.1–1.8)
Albumin: 2.9 g/dL — ABNORMAL LOW (ref 3.7–5.1)
Alkaline Phosphatase: 184 U/L — ABNORMAL HIGH (ref 43–122)
BUN: 36 mg/dL — ABNORMAL HIGH (ref 7–21)
Bilirubin, Total: <0.1 mg/dL — ABNORMAL LOW (ref 0.2–1.3)
CO2: 25 mEq/L (ref 22–31)
Calcium: 8.9 mg/dL (ref 8.6–10.2)
Chloride: 104 mEq/L (ref 98–107)
Creatinine: 1.1 mg/dL (ref 0.5–1.4)
Globulin: 4.2 g/dL — ABNORMAL HIGH (ref 2.0–3.7)
Glucose: 134 mg/dL — ABNORMAL HIGH (ref 70–100)
Potassium: 4.8 mEq/L (ref 3.6–5.0)
Protein, Total: 7.1 g/dL (ref 6.0–8.0)
Sodium: 139 mEq/L (ref 136–143)

## 2009-05-09 LAB — CBC
Hematocrit: 31 % — ABNORMAL LOW (ref 37.0–47.0)
Hgb: 9.7 g/dL — ABNORMAL LOW (ref 12.0–16.0)
MCH: 29.2 pg (ref 28.0–32.0)
MCHC: 31.3 g/dL — ABNORMAL LOW (ref 32.0–36.0)
MCV: 93.4 fL (ref 80.0–100.0)
MPV: 10.7 fL (ref 9.4–12.3)
Platelets: 448 10*3/uL — ABNORMAL HIGH (ref 140–400)
RBC: 3.32 10*6/uL — ABNORMAL LOW (ref 4.20–5.40)
RDW: 16 % — ABNORMAL HIGH (ref 12–15)
WBC: 9.29 10*3/uL (ref 3.50–10.80)

## 2009-05-09 LAB — GFR: EGFR: 56.3

## 2009-05-11 LAB — BASIC METABOLIC PANEL
BUN: 34 mg/dL — ABNORMAL HIGH (ref 7–21)
CO2: 27 mEq/L (ref 22–31)
Calcium: 9.3 mg/dL (ref 8.6–10.2)
Chloride: 106 mEq/L (ref 98–107)
Creatinine: 1 mg/dL (ref 0.5–1.4)
Glucose: 92 mg/dL (ref 70–100)
Potassium: 5.1 mEq/L — ABNORMAL HIGH (ref 3.6–5.0)
Sodium: 140 mEq/L (ref 136–143)

## 2009-05-11 LAB — GFR: EGFR: >60

## 2009-05-12 LAB — BASIC METABOLIC PANEL
BUN: 33 mg/dL — ABNORMAL HIGH (ref 7–21)
CO2: 23 mEq/L (ref 22–31)
Calcium: 9.1 mg/dL (ref 8.6–10.2)
Chloride: 105 mEq/L (ref 98–107)
Creatinine: 1.3 mg/dL (ref 0.5–1.4)
Glucose: 121 mg/dL — ABNORMAL HIGH (ref 70–100)
Potassium: 4.9 mEq/L (ref 3.6–5.0)
Sodium: 140 mEq/L (ref 136–143)

## 2009-05-12 LAB — GFR: EGFR: 46.4

## 2009-05-13 LAB — CBC AND DIFFERENTIAL
Basophils Absolute: 0 10*3/uL (ref 0.00–0.20)
Basophils: 0 % (ref 0–2)
Eosinophils Absolute: 0.2 10*3/uL (ref 0.00–0.70)
Eosinophils: 2 % (ref 0–5)
Granulocytes Absolute: 4.6 10*3/uL (ref 1.80–8.10)
Hematocrit: 30 % — ABNORMAL LOW (ref 37.0–47.0)
Hgb: 9.2 g/dL — ABNORMAL LOW (ref 12.0–16.0)
Immature Granulocytes Absolute: 0.02 10*3/uL
Immature Granulocytes: 0 % (ref 0–1)
Lymphocytes Absolute: 2.7 10*3/uL (ref 0.50–4.40)
Lymphocytes: 33 % (ref 15–41)
MCH: 28.5 pg (ref 28.0–32.0)
MCHC: 30.7 g/dL — ABNORMAL LOW (ref 32.0–36.0)
MCV: 92.9 fL (ref 80.0–100.0)
MPV: 10.4 fL (ref 9.4–12.3)
Monocytes Absolute: 0.6 10*3/uL (ref 0.00–1.20)
Monocytes: 8 % (ref 0–11)
Neutrophils %: 57 % (ref 52–75)
Platelets: 333 10*3/uL (ref 140–400)
RBC: 3.23 10*6/uL — ABNORMAL LOW (ref 4.20–5.40)
RDW: 16 % — ABNORMAL HIGH (ref 12–15)
WBC: 8.15 10*3/uL (ref 3.50–10.80)

## 2009-05-13 LAB — BASIC METABOLIC PANEL
BUN: 35 mg/dL — ABNORMAL HIGH (ref 7–21)
CO2: 22 mEq/L (ref 22–31)
Calcium: 8.8 mg/dL (ref 8.6–10.2)
Chloride: 109 mEq/L — ABNORMAL HIGH (ref 98–107)
Creatinine: 1.1 mg/dL (ref 0.5–1.4)
Glucose: 60 mg/dL — ABNORMAL LOW (ref 70–100)
Potassium: 4.7 mEq/L (ref 3.6–5.0)
Sodium: 141 mEq/L (ref 136–143)

## 2009-05-13 LAB — GFR: EGFR: 56.3

## 2009-06-14 ENCOUNTER — Emergency Department: Admit: 2009-06-14 | Payer: Self-pay | Source: Emergency Department | Admitting: Emergency Medicine

## 2009-06-14 LAB — COMPREHENSIVE METABOLIC PANEL
ALT: 51 U/L (ref 21–72)
AST (SGOT): 62 U/L — ABNORMAL HIGH (ref 8–39)
Albumin/Globulin Ratio: 0.9 — ABNORMAL LOW (ref 1.1–1.8)
Albumin: 3.5 g/dL — ABNORMAL LOW (ref 3.7–5.1)
Alkaline Phosphatase: 173 U/L — ABNORMAL HIGH (ref 43–122)
BUN: 30 mg/dL — ABNORMAL HIGH (ref 7–21)
Bilirubin, Total: 0.4 mg/dL (ref 0.2–1.3)
CO2: 24 mEq/L (ref 22–31)
Calcium: 8.8 mg/dL (ref 8.6–10.2)
Chloride: 106 mEq/L (ref 98–107)
Creatinine: 1 mg/dL (ref 0.5–1.4)
Globulin: 4 g/dL — ABNORMAL HIGH (ref 2.0–3.7)
Glucose: 135 mg/dL — ABNORMAL HIGH (ref 70–100)
Potassium: 4.5 mEq/L (ref 3.6–5.0)
Protein, Total: 7.5 g/dL (ref 6.0–8.0)
Sodium: 140 mEq/L (ref 136–143)

## 2009-06-14 LAB — CBC AND DIFFERENTIAL
Baso(Absolute): 0 10*3/uL (ref 0.00–0.20)
Basophils: 0 % (ref 0–2)
Eosinophils Absolute: 0.2 10*3/uL (ref 0.00–0.70)
Eosinophils: 2 % (ref 0–5)
Hematocrit: 29.2 % — ABNORMAL LOW (ref 37.0–47.0)
Hgb: 9.2 g/dL — ABNORMAL LOW (ref 12.0–16.0)
Immature Granulocytes Absolute: 0.01 10*3/uL
Immature Granulocytes: 0 % (ref 0–1)
Lymphocytes Absolute: 1.6 10*3/uL (ref 0.50–4.40)
Lymphocytes: 24 % (ref 15–41)
MCH: 29.2 pg (ref 28.0–32.0)
MCHC: 31.5 g/dL — ABNORMAL LOW (ref 32.0–36.0)
MCV: 92.7 fL (ref 80.0–100.0)
MPV: 11.1 fL (ref 9.4–12.3)
Monocytes Absolute: 0.7 10*3/uL (ref 0.00–1.20)
Monocytes: 11 % (ref 0–11)
Neutrophils Absolute: 4.2 10*3/uL
Neutrophils: 62 % (ref 52–75)
Platelets: 188 10*3/uL (ref 140–400)
RBC: 3.15 10*6/uL — ABNORMAL LOW (ref 4.20–5.40)
RDW: 15 % (ref 12–15)
WBC: 6.78 10*3/uL (ref 3.50–10.80)

## 2009-06-14 LAB — URINALYSIS WITH MICROSCOPIC
Bilirubin, UA: NEGATIVE
Blood, UA: NEGATIVE
Glucose, UA: NEGATIVE
Ketones UA: NEGATIVE
Nitrite, UA: NEGATIVE
Protein, UR: NEGATIVE
Specific Gravity UA POCT: 1.005 (ref 1.001–1.035)
Urine pH: 6 (ref 5.0–8.0)
Urobilinogen, UA: NORMAL mg/dL

## 2009-06-14 LAB — GFR: EGFR: >60

## 2009-08-09 ENCOUNTER — Ambulatory Visit
Admit: 2009-08-09 | Disposition: A | Discharge status: Home / Self Care | Payer: Self-pay | Source: Ambulatory Visit | Admitting: Internal Medicine

## 2009-08-18 ENCOUNTER — Inpatient Hospital Stay
Admission: AD | Admit: 2009-08-18 | Disposition: A | Discharge status: Home / Self Care | Payer: Self-pay | Source: Emergency Department | Admitting: Internal Medicine

## 2009-08-18 LAB — CBC AND DIFFERENTIAL
Baso(Absolute): 0.04 10*3/uL (ref 0.00–0.20)
Basophils: 0 % (ref 0–2)
Eosinophils Absolute: 0.27 10*3/uL (ref 0.00–0.70)
Eosinophils: 3 % (ref 0–5)
Hematocrit: 25.8 % — ABNORMAL LOW (ref 37.0–47.0)
Hgb: 8.1 g/dL — ABNORMAL LOW (ref 12.0–16.0)
Immature Granulocytes Absolute: 0.02 10*3/uL
Immature Granulocytes: 0 % (ref 0–1)
Lymphocytes Absolute: 2.24 10*3/uL (ref 0.50–4.40)
Lymphocytes: 25 % (ref 15–41)
MCH: 28.5 pg (ref 28.0–32.0)
MCHC: 31.4 g/dL — ABNORMAL LOW (ref 32.0–36.0)
MCV: 90.8 fL (ref 80.0–100.0)
MPV: 11.2 fL (ref 9.4–12.3)
Monocytes Absolute: 0.88 10*3/uL (ref 0.00–1.20)
Monocytes: 10 % (ref 0–11)
Neutrophils Absolute: 5.63 10*3/uL
Neutrophils: 62 % (ref 52–75)
Platelets: 206 10*3/uL (ref 140–400)
RBC: 2.84 10*6/uL — ABNORMAL LOW (ref 4.20–5.40)
RDW: 16 % — ABNORMAL HIGH (ref 12–15)
WBC: 9.06 10*3/uL (ref 3.50–10.80)

## 2009-08-18 LAB — CHOLESTEROL, TOTAL: Cholesterol: 170 mg/dL (ref 0–199)

## 2009-08-18 LAB — COMPREHENSIVE METABOLIC PANEL
ALT: 32 U/L (ref 21–72)
AST (SGOT): 23 U/L (ref 8–39)
Albumin/Globulin Ratio: 0.8 — ABNORMAL LOW (ref 1.1–1.8)
Albumin: 2.9 g/dL — ABNORMAL LOW (ref 3.7–5.1)
Alkaline Phosphatase: 184 U/L — ABNORMAL HIGH (ref 43–122)
BUN: 26 mg/dL — ABNORMAL HIGH (ref 7–21)
Bilirubin, Total: 0.3 mg/dL (ref 0.2–1.3)
CO2: 26 mEq/L (ref 22–31)
Calcium: 8.5 mg/dL — ABNORMAL LOW (ref 8.6–10.2)
Chloride: 104 mEq/L (ref 98–107)
Creatinine: 1 mg/dL (ref 0.5–1.4)
Globulin: 3.6 g/dL (ref 2.0–3.7)
Glucose: 231 mg/dL — ABNORMAL HIGH (ref 70–100)
Potassium: 4.1 mEq/L (ref 3.6–5.0)
Protein, Total: 6.5 g/dL (ref 6.0–8.0)
Sodium: 138 mEq/L (ref 136–143)

## 2009-08-18 LAB — TRIGLYCERIDES: Triglycerides: 132 mg/dL (ref 34–149)

## 2009-08-18 LAB — AMYLASE: Amylase: 50 U/L (ref 30–110)

## 2009-08-18 LAB — TROPONIN I
Troponin I: <0.01 ng/mL (ref 0.00–0.03)
Troponin I: 0.01 ng/mL (ref 0.00–0.03)

## 2009-08-18 LAB — HDL CHOLESTEROL: HDL: 38 mg/dL — ABNORMAL LOW (ref >40)

## 2009-08-18 LAB — PT AND APTT
PT INR: 1.2 — ABNORMAL HIGH (ref 0.9–1.1)
PT: 14.1 s — ABNORMAL HIGH (ref 10.8–13.3)
PTT: 28 s (ref 21–32)

## 2009-08-18 LAB — GFR: EGFR: >60

## 2009-08-18 LAB — LDL CHOLESTEROL, DIRECT: LDL CHOLESTEROL DIRECT: 80 mg/dL (ref <129)

## 2009-08-18 LAB — TYPE AND SCREEN
AB Screen Gel: NEGATIVE
ABO Rh: O POS

## 2009-08-18 LAB — CK
Creatine Kinase (CK): 101 U/L (ref 20–140)
Creatine Kinase (CK): 78 U/L (ref 20–140)

## 2009-08-18 LAB — LIPASE: Lipase: 111 U/L (ref 23–300)

## 2009-08-19 LAB — CBC
Hematocrit: 34.4 % — ABNORMAL LOW (ref 37.0–47.0)
Hgb: 11.1 g/dL — ABNORMAL LOW (ref 12.0–16.0)
MCH: 28.9 pg (ref 28.0–32.0)
MCHC: 32.3 g/dL (ref 32.0–36.0)
MCV: 89.6 fL (ref 80.0–100.0)
MPV: 11.7 fL (ref 9.4–12.3)
Platelets: 181 10*3/uL (ref 140–400)
RBC: 3.84 10*6/uL — ABNORMAL LOW (ref 4.20–5.40)
RDW: 16 % — ABNORMAL HIGH (ref 12–15)
WBC: 8.87 10*3/uL (ref 3.50–10.80)

## 2009-08-19 LAB — HEMOGLOBIN AND HEMATOCRIT, BLOOD
Hematocrit: 33.2 % — ABNORMAL LOW (ref 37.0–47.0)
Hgb: 10.9 g/dL — ABNORMAL LOW (ref 12.0–16.0)

## 2009-08-19 LAB — BASIC METABOLIC PANEL
BUN: 20 mg/dL (ref 7–21)
BUN: 19 mg/dL (ref 7–21)
CO2: 29 mEq/L (ref 22–31)
CO2: 27 mEq/L (ref 22–31)
Calcium: 8.6 mg/dL (ref 8.6–10.2)
Calcium: 8.4 mg/dL — ABNORMAL LOW (ref 8.6–10.2)
Chloride: 108 mEq/L — ABNORMAL HIGH (ref 98–107)
Chloride: 105 mEq/L (ref 98–107)
Creatinine: 0.9 mg/dL (ref 0.5–1.4)
Creatinine: 0.8 mg/dL (ref 0.5–1.4)
Glucose: 61 mg/dL — ABNORMAL LOW (ref 70–100)
Glucose: 108 mg/dL — ABNORMAL HIGH (ref 70–100)
Potassium: 5.1 mEq/L — ABNORMAL HIGH (ref 3.6–5.0)
Potassium: 4.1 mEq/L (ref 3.6–5.0)
Sodium: 142 mEq/L (ref 136–143)
Sodium: 137 mEq/L (ref 136–143)

## 2009-08-19 LAB — MAGNESIUM: Magnesium: 1.6 mg/dL (ref 1.6–2.3)

## 2009-08-19 LAB — TROPONIN I: Troponin I: 0.01 ng/mL (ref 0.00–0.03)

## 2009-08-19 LAB — GFR
EGFR: >60
EGFR: >60

## 2009-08-19 LAB — CK: Creatine Kinase (CK): 70 U/L (ref 20–140)

## 2009-08-19 LAB — HEMOGLOBIN A1C: Hemoglobin A1C: 8.7 % — ABNORMAL HIGH (ref 0.0–6.0)

## 2009-08-20 LAB — LIPID PANEL
Cholesterol / HDL Ratio: 5.5 Index
Cholesterol: 166 mg/dL (ref 0–199)
HDL: 30 mg/dL — ABNORMAL LOW (ref >40)
LDL Calculated: 102 mg/dL — ABNORMAL HIGH (ref 0–99)
Triglycerides: 170 mg/dL — ABNORMAL HIGH (ref 34–149)
VLDL Calculated: 34 mg/dL (ref 10–40)

## 2009-08-20 LAB — HEMOGLOBIN AND HEMATOCRIT, BLOOD
Hematocrit: 31.1 % — ABNORMAL LOW (ref 37.0–47.0)
Hematocrit: 32.6 % — ABNORMAL LOW (ref 37.0–47.0)
Hgb: 10 g/dL — ABNORMAL LOW (ref 12.0–16.0)
Hgb: 10.3 g/dL — ABNORMAL LOW (ref 12.0–16.0)

## 2009-08-20 LAB — HEMOLYSIS INDEX: Hemolysis Index: 10 Index (ref 0–18)

## 2009-08-21 LAB — CBC AND DIFFERENTIAL
Baso(Absolute): 0.02 10*3/uL (ref 0.00–0.20)
Basophils: 0 % (ref 0–2)
Eosinophils Absolute: 0.25 10*3/uL (ref 0.00–0.70)
Eosinophils: 3 % (ref 0–5)
Hematocrit: 30.9 % — ABNORMAL LOW (ref 37.0–47.0)
Hgb: 9.9 g/dL — ABNORMAL LOW (ref 12.0–16.0)
Immature Granulocytes Absolute: 0.04 10*3/uL
Immature Granulocytes: 1 % (ref 0–1)
Lymphocytes Absolute: 1.52 10*3/uL (ref 0.50–4.40)
Lymphocytes: 18 % (ref 15–41)
MCH: 28.9 pg (ref 28.0–32.0)
MCHC: 32 g/dL (ref 32.0–36.0)
MCV: 90.1 fL (ref 80.0–100.0)
MPV: 11.1 fL (ref 9.4–12.3)
Monocytes Absolute: 1.25 10*3/uL — ABNORMAL HIGH (ref 0.00–1.20)
Monocytes: 15 % — ABNORMAL HIGH (ref 0–11)
Neutrophils Absolute: 5.59 10*3/uL
Neutrophils: 65 % (ref 52–75)
Platelets: 181 10*3/uL (ref 140–400)
RBC: 3.43 10*6/uL — ABNORMAL LOW (ref 4.20–5.40)
RDW: 16 % — ABNORMAL HIGH (ref 12–15)
WBC: 8.63 10*3/uL (ref 3.50–10.80)

## 2009-08-21 LAB — COMPREHENSIVE METABOLIC PANEL
ALT: 27 U/L (ref 21–72)
AST (SGOT): 26 U/L (ref 8–39)
Albumin/Globulin Ratio: 0.7 — ABNORMAL LOW (ref 1.1–1.8)
Albumin: 2.4 g/dL — ABNORMAL LOW (ref 3.7–5.1)
Alkaline Phosphatase: 192 U/L — ABNORMAL HIGH (ref 43–122)
BUN: 22 mg/dL — ABNORMAL HIGH (ref 7–21)
Bilirubin, Total: 0.3 mg/dL (ref 0.2–1.3)
CO2: 27 mEq/L (ref 22–31)
Calcium: 7.9 mg/dL — ABNORMAL LOW (ref 8.6–10.2)
Chloride: 104 mEq/L (ref 98–107)
Creatinine: 1 mg/dL (ref 0.5–1.4)
Globulin: 3.3 g/dL (ref 2.0–3.7)
Glucose: 152 mg/dL — ABNORMAL HIGH (ref 70–100)
Potassium: 4.4 mEq/L (ref 3.6–5.0)
Protein, Total: 5.7 g/dL — ABNORMAL LOW (ref 6.0–8.0)
Sodium: 134 mEq/L — ABNORMAL LOW (ref 136–143)

## 2009-08-21 LAB — GFR: EGFR: >60

## 2009-08-21 LAB — HEMOGLOBIN AND HEMATOCRIT, BLOOD
Hematocrit: 29.2 % — ABNORMAL LOW (ref 37.0–47.0)
Hgb: 9.3 g/dL — ABNORMAL LOW (ref 12.0–16.0)

## 2009-08-22 LAB — CBC AND DIFFERENTIAL
Baso(Absolute): 0.02 10*3/uL (ref 0.00–0.20)
Basophils: 0 % (ref 0–2)
Eosinophils Absolute: 0.23 10*3/uL (ref 0.00–0.70)
Eosinophils: 3 % (ref 0–5)
Hematocrit: 26.2 % — ABNORMAL LOW (ref 37.0–47.0)
Hgb: 8.4 g/dL — ABNORMAL LOW (ref 12.0–16.0)
Immature Granulocytes Absolute: 0.04 10*3/uL
Immature Granulocytes: 1 % (ref 0–1)
Lymphocytes Absolute: 1.7 10*3/uL (ref 0.50–4.40)
Lymphocytes: 21 % (ref 15–41)
MCH: 29.1 pg (ref 28.0–32.0)
MCHC: 32.1 g/dL (ref 32.0–36.0)
MCV: 90.7 fL (ref 80.0–100.0)
MPV: 11.3 fL (ref 9.4–12.3)
Monocytes Absolute: 1.5 10*3/uL — ABNORMAL HIGH (ref 0.00–1.20)
Monocytes: 18 % — ABNORMAL HIGH (ref 0–11)
Neutrophils Absolute: 4.72 10*3/uL
Neutrophils: 58 % (ref 52–75)
Platelets: 187 10*3/uL (ref 140–400)
RBC: 2.89 10*6/uL — ABNORMAL LOW (ref 4.20–5.40)
RDW: 16 % — ABNORMAL HIGH (ref 12–15)
WBC: 8.17 10*3/uL (ref 3.50–10.80)

## 2009-08-22 LAB — TYPE AND SCREEN
AB Screen Gel: NEGATIVE
ABO Rh: O POS

## 2009-08-22 LAB — HEMOGLOBIN AND HEMATOCRIT, BLOOD
Hematocrit: 26.8 % — ABNORMAL LOW (ref 37.0–47.0)
Hgb: 8.6 g/dL — ABNORMAL LOW (ref 12.0–16.0)

## 2009-08-22 LAB — PREPARE RBC

## 2009-08-23 LAB — BASIC METABOLIC PANEL
BUN: 16 mg/dL (ref 7–21)
CO2: 28 mEq/L (ref 22–31)
Calcium: 7.5 mg/dL — ABNORMAL LOW (ref 8.6–10.2)
Chloride: 106 mEq/L (ref 98–107)
Creatinine: 1 mg/dL (ref 0.5–1.4)
Glucose: 125 mg/dL — ABNORMAL HIGH (ref 70–100)
Potassium: 4.2 mEq/L (ref 3.6–5.0)
Sodium: 137 mEq/L (ref 136–143)

## 2009-08-23 LAB — CBC
Hematocrit: 24.8 % — ABNORMAL LOW (ref 37.0–47.0)
Hgb: 7.9 g/dL — ABNORMAL LOW (ref 12.0–16.0)
MCH: 28.8 pg (ref 28.0–32.0)
MCHC: 31.9 g/dL — ABNORMAL LOW (ref 32.0–36.0)
MCV: 90.5 fL (ref 80.0–100.0)
MPV: 10.7 fL (ref 9.4–12.3)
Platelets: 210 10*3/uL (ref 140–400)
RBC: 2.74 10*6/uL — ABNORMAL LOW (ref 4.20–5.40)
RDW: 16 % — ABNORMAL HIGH (ref 12–15)
WBC: 7.24 10*3/uL (ref 3.50–10.80)

## 2009-08-23 LAB — HEMOGLOBIN AND HEMATOCRIT, BLOOD
Hematocrit: 29.8 % — ABNORMAL LOW (ref 37.0–47.0)
Hgb: 9.2 g/dL — ABNORMAL LOW (ref 12.0–16.0)

## 2009-08-23 LAB — GFR: EGFR: >60

## 2009-08-23 LAB — MAGNESIUM: Magnesium: 1.7 mg/dL (ref 1.6–2.3)

## 2009-08-24 LAB — CBC AND DIFFERENTIAL
Baso(Absolute): 0.02 10*3/uL (ref 0.00–0.20)
Basophils: 0 % (ref 0–2)
Eosinophils Absolute: 0.23 10*3/uL (ref 0.00–0.70)
Eosinophils: 3 % (ref 0–5)
Hematocrit: 24.8 % — ABNORMAL LOW (ref 37.0–47.0)
Hgb: 7.8 g/dL — ABNORMAL LOW (ref 12.0–16.0)
Immature Granulocytes Absolute: 0.03 10*3/uL
Immature Granulocytes: 0 % (ref 0–1)
Lymphocytes Absolute: 1.8 10*3/uL (ref 0.50–4.40)
Lymphocytes: 20 % (ref 15–41)
MCH: 28.9 pg (ref 28.0–32.0)
MCHC: 31.5 g/dL — ABNORMAL LOW (ref 32.0–36.0)
MCV: 91.9 fL (ref 80.0–100.0)
MPV: 10.5 fL (ref 9.4–12.3)
Monocytes Absolute: 0.93 10*3/uL (ref 0.00–1.20)
Monocytes: 10 % (ref 0–11)
Neutrophils Absolute: 5.93 10*3/uL
Neutrophils: 67 % (ref 52–75)
Platelets: 242 10*3/uL (ref 140–400)
RBC: 2.7 10*6/uL — ABNORMAL LOW (ref 4.20–5.40)
RDW: 16 % — ABNORMAL HIGH (ref 12–15)
WBC: 8.91 10*3/uL (ref 3.50–10.80)

## 2009-08-24 LAB — HEMOGLOBIN AND HEMATOCRIT, BLOOD
Hematocrit: 25.4 % — ABNORMAL LOW (ref 37.0–47.0)
Hgb: 8.2 g/dL — ABNORMAL LOW (ref 12.0–16.0)

## 2009-08-24 LAB — BASIC METABOLIC PANEL
BUN: 16 mg/dL (ref 7–21)
CO2: 30 mEq/L (ref 22–31)
Calcium: 8.1 mg/dL — ABNORMAL LOW (ref 8.6–10.2)
Chloride: 104 mEq/L (ref 98–107)
Creatinine: 1.2 mg/dL (ref 0.5–1.4)
Glucose: 107 mg/dL — ABNORMAL HIGH (ref 70–100)
Potassium: 4.4 mEq/L (ref 3.6–5.0)
Sodium: 137 mEq/L (ref 136–143)

## 2009-08-24 LAB — GFR: EGFR: 50.9

## 2009-08-25 LAB — CBC AND DIFFERENTIAL
Baso(Absolute): 0.03 10*3/uL (ref 0.00–0.20)
Basophils: 0 % (ref 0–2)
Eosinophils Absolute: 0.24 10*3/uL (ref 0.00–0.70)
Eosinophils: 4 % (ref 0–5)
Hematocrit: 23.6 % — ABNORMAL LOW (ref 37.0–47.0)
Hgb: 7.4 g/dL — ABNORMAL LOW (ref 12.0–16.0)
Immature Granulocytes Absolute: 0.04 10*3/uL
Immature Granulocytes: 1 % (ref 0–1)
Lymphocytes Absolute: 1.47 10*3/uL (ref 0.50–4.40)
Lymphocytes: 25 % (ref 15–41)
MCH: 28.6 pg (ref 28.0–32.0)
MCHC: 31.4 g/dL — ABNORMAL LOW (ref 32.0–36.0)
MCV: 91.1 fL (ref 80.0–100.0)
MPV: 10.6 fL (ref 9.4–12.3)
Monocytes Absolute: 0.55 10*3/uL (ref 0.00–1.20)
Monocytes: 9 % (ref 0–11)
Neutrophils Absolute: 3.68 10*3/uL
Neutrophils: 62 % (ref 52–75)
Platelets: 253 10*3/uL (ref 140–400)
RBC: 2.59 10*6/uL — ABNORMAL LOW (ref 4.20–5.40)
RDW: 15 % (ref 12–15)
WBC: 5.97 10*3/uL (ref 3.50–10.80)

## 2009-08-25 LAB — URINALYSIS, REFLEX TO MICROSCOPIC EXAM IF INDICATED
Bilirubin, UA: NEGATIVE
Blood, UA: NEGATIVE
Glucose, UA: NEGATIVE
Ketones UA: NEGATIVE
Leukocyte Esterase, UA: NEGATIVE
Nitrite, UA: NEGATIVE
Protein, UR: NEGATIVE
Specific Gravity UA POCT: 1.005 (ref 1.001–1.035)
Urine pH: 6 (ref 5.0–8.0)
Urobilinogen, UA: 0.2 mg/dL

## 2009-08-25 LAB — BASIC METABOLIC PANEL
BUN: 14 mg/dL (ref 7–21)
CO2: 27 mEq/L (ref 22–31)
Calcium: 7.8 mg/dL — ABNORMAL LOW (ref 8.6–10.2)
Chloride: 105 mEq/L (ref 98–107)
Creatinine: 1 mg/dL (ref 0.5–1.4)
Glucose: 104 mg/dL — ABNORMAL HIGH (ref 70–100)
Potassium: 4.1 mEq/L (ref 3.6–5.0)
Sodium: 136 mEq/L (ref 136–143)

## 2009-08-25 LAB — GFR: EGFR: >60

## 2009-08-25 LAB — HEMOGLOBIN AND HEMATOCRIT, BLOOD
Hematocrit: 25.3 % — ABNORMAL LOW (ref 37.0–47.0)
Hgb: 8.1 g/dL — ABNORMAL LOW (ref 12.0–16.0)

## 2009-08-26 LAB — CBC AND DIFFERENTIAL
Baso(Absolute): 0.03 10*3/uL (ref 0.00–0.20)
Basophils: 0 % (ref 0–2)
Eosinophils Absolute: 0.23 10*3/uL (ref 0.00–0.70)
Eosinophils: 3 % (ref 0–5)
Hematocrit: 25.4 % — ABNORMAL LOW (ref 37.0–47.0)
Hgb: 8 g/dL — ABNORMAL LOW (ref 12.0–16.0)
Lymphocytes Absolute: 1.28 10*3/uL (ref 0.50–4.40)
Lymphocytes: 18 % (ref 15–41)
MCH: 29.4 pg (ref 28.0–32.0)
MCHC: 31.5 g/dL — ABNORMAL LOW (ref 32.0–36.0)
MCV: 93.4 fL (ref 80.0–100.0)
MPV: 10.6 fL (ref 9.4–12.3)
Monocytes Absolute: 0.49 10*3/uL (ref 0.00–1.20)
Monocytes: 7 % (ref 0–11)
Neutrophils Absolute: 4.89 10*3/uL
Neutrophils: 71 % (ref 52–75)
Platelets: 295 10*3/uL (ref 140–400)
RBC: 2.72 10*6/uL — ABNORMAL LOW (ref 4.20–5.40)
RDW: 15 % (ref 12–15)
WBC: 6.92 10*3/uL (ref 3.50–10.80)

## 2009-08-26 LAB — HEMOGLOBIN AND HEMATOCRIT, BLOOD
Hematocrit: 23.1 % — ABNORMAL LOW (ref 37.0–47.0)
Hgb: 7.3 g/dL — ABNORMAL LOW (ref 12.0–16.0)

## 2009-08-26 LAB — TYPE AND SCREEN
AB Screen Gel: NEGATIVE
ABO Rh: O POS

## 2009-08-26 LAB — PREPARE RBC

## 2009-08-27 LAB — CBC
Hematocrit: 32.5 % — ABNORMAL LOW (ref 37.0–47.0)
Hgb: 10.4 g/dL — ABNORMAL LOW (ref 12.0–16.0)
MCH: 28.4 pg (ref 28.0–32.0)
MCHC: 32 g/dL (ref 32.0–36.0)
MCV: 88.8 fL (ref 80.0–100.0)
MPV: 10.8 fL (ref 9.4–12.3)
Platelets: 286 10*3/uL (ref 140–400)
RBC: 3.66 10*6/uL — ABNORMAL LOW (ref 4.20–5.40)
RDW: 16 % — ABNORMAL HIGH (ref 12–15)
WBC: 6.45 10*3/uL (ref 3.50–10.80)

## 2009-08-30 LAB — PREPARE RBC

## 2009-11-28 ENCOUNTER — Ambulatory Visit
Admit: 2009-11-28 | Disposition: A | Discharge status: Home / Self Care | Payer: Self-pay | Source: Ambulatory Visit | Admitting: Internal Medicine

## 2009-11-28 ENCOUNTER — Inpatient Hospital Stay
Admission: EM | Admit: 2009-11-28 | Disposition: A | Discharge status: Home / Self Care | Payer: Self-pay | Source: Emergency Department | Admitting: Internal Medicine

## 2009-11-28 LAB — PT AND APTT
PT INR: 1.1 (ref 0.9–1.1)
PT: 12.8 s (ref 10.8–13.3)
PTT: 21 s (ref 21–32)

## 2009-11-28 LAB — COMPREHENSIVE METABOLIC PANEL
ALT: 18 U/L — ABNORMAL LOW (ref 21–72)
AST (SGOT): 24 U/L (ref 8–39)
Albumin/Globulin Ratio: 0.6 — ABNORMAL LOW (ref 1.1–1.8)
Albumin: 3.1 g/dL — ABNORMAL LOW (ref 3.7–5.1)
Alkaline Phosphatase: 119 U/L (ref 43–122)
BUN: 23 mg/dL — ABNORMAL HIGH (ref 7–21)
Bilirubin, Total: 0.3 mg/dL (ref 0.2–1.3)
CO2: 27 mEq/L (ref 22–31)
Calcium: 8.5 mg/dL — ABNORMAL LOW (ref 8.6–10.2)
Chloride: 104 mEq/L (ref 98–107)
Creatinine: 1 mg/dL (ref 0.5–1.4)
Globulin: 4.8 g/dL — ABNORMAL HIGH (ref 2.0–3.7)
Glucose: 192 mg/dL — ABNORMAL HIGH (ref 70–100)
Potassium: 4.4 mEq/L (ref 3.6–5.0)
Protein, Total: 7.9 g/dL (ref 6.0–8.0)
Sodium: 140 mEq/L (ref 136–143)

## 2009-11-28 LAB — CBC AND DIFFERENTIAL
Baso(Absolute): 0.02 10*3/uL (ref 0.00–0.20)
Basophils: 0 % (ref 0–2)
Eosinophils Absolute: 0.31 10*3/uL (ref 0.00–0.70)
Eosinophils: 4 % (ref 0–5)
Hematocrit: 26.9 % — ABNORMAL LOW (ref 37.0–47.0)
Hgb: 9.9 g/dL — ABNORMAL LOW (ref 12.0–16.0)
Immature Granulocytes Absolute: 0.02 10*3/uL
Immature Granulocytes: 0 % (ref 0–1)
Lymphocytes Absolute: 1.88 10*3/uL (ref 0.50–4.40)
Lymphocytes: 24 % (ref 15–41)
MCH: 33.9 pg — ABNORMAL HIGH (ref 28.0–32.0)
MCHC: 36.8 g/dL — ABNORMAL HIGH (ref 32.0–36.0)
MCV: 92.1 fL (ref 80.0–100.0)
MPV: 10.9 fL (ref 9.4–12.3)
Monocytes Absolute: 0.81 10*3/uL (ref 0.00–1.20)
Monocytes: 10 % (ref 0–11)
Neutrophils Absolute: 4.81 10*3/uL
Neutrophils: 61 % (ref 52–75)
Platelets: 232 10*3/uL (ref 140–400)
RBC: 2.92 10*6/uL — ABNORMAL LOW (ref 4.20–5.40)
RDW: 16 % — ABNORMAL HIGH (ref 12–15)
WBC: 7.83 10*3/uL (ref 3.50–10.80)

## 2009-11-28 LAB — GFR: EGFR: >60

## 2009-11-29 LAB — PT/INR
PT INR: 1.2 — ABNORMAL HIGH (ref 0.9–1.1)
PT: 14.2 s — ABNORMAL HIGH (ref 10.8–13.3)

## 2009-11-30 LAB — GFR: EGFR: >60

## 2009-11-30 LAB — BASIC METABOLIC PANEL
BUN: 16 mg/dL (ref 7–21)
CO2: 27 mEq/L (ref 22–31)
Calcium: 8.7 mg/dL (ref 8.6–10.2)
Chloride: 107 mEq/L (ref 98–107)
Creatinine: 0.9 mg/dL (ref 0.5–1.4)
Glucose: 63 mg/dL — ABNORMAL LOW (ref 70–100)
Potassium: 4.4 mEq/L (ref 3.6–5.0)
Sodium: 141 mEq/L (ref 136–143)

## 2009-11-30 LAB — PT/INR
PT INR: 1.2 — ABNORMAL HIGH (ref 0.9–1.1)
PT: 14 s — ABNORMAL HIGH (ref 10.8–13.3)

## 2010-02-13 ENCOUNTER — Inpatient Hospital Stay
Admission: EM | Admit: 2010-02-13 | Disposition: A | Discharge status: Home / Self Care | Payer: Self-pay | Source: Ambulatory Visit | Admitting: Internal Medicine

## 2010-02-13 LAB — GFR: EGFR: 42.5

## 2010-02-13 LAB — CBC WITH MANUAL DIFFERENTIAL
Band Neutrophils: 6 % (ref 0–9)
Bands Absolute: 1.06 10*3/uL — ABNORMAL HIGH (ref 0.00–1.00)
Baso(Absolute): 0 10*3/uL (ref 0.00–0.20)
Basophils: 0 % (ref 0–2)
Cell Morphology: NORMAL
Eosinophils Absolute: 0 10*3/uL (ref 0.00–0.70)
Eosinophils: 0 % (ref 0–5)
Hematocrit: 31.8 % — ABNORMAL LOW (ref 37.0–47.0)
Hgb: 10.5 g/dL — ABNORMAL LOW (ref 12.0–16.0)
Lymphocytes Absolute: 1.24 10*3/uL (ref 0.50–4.40)
Lymphocytes: 7 % — ABNORMAL LOW (ref 15–41)
MCH: 29.6 pg (ref 28.0–32.0)
MCHC: 33 g/dL (ref 32.0–36.0)
MCV: 89.6 fL (ref 80.0–100.0)
MPV: 12.4 fL — ABNORMAL HIGH (ref 9.4–12.3)
Monocytes Absolute: 0.71 10*3/uL (ref 0.00–1.20)
Monocytes: 4 % (ref 0–11)
Neutrophils Absolute Count: 14.67 10*3/uL — ABNORMAL HIGH (ref 1.80–8.10)
Neutrophils: 83 % — ABNORMAL HIGH (ref 52–75)
Platelets: 170 10*3/uL (ref 140–400)
RBC: 3.55 10*6/uL — ABNORMAL LOW (ref 4.20–5.40)
RDW: 16 % — ABNORMAL HIGH (ref 12–15)
WBC: 17.68 10*3/uL — ABNORMAL HIGH (ref 3.50–10.80)

## 2010-02-13 LAB — URINALYSIS, REFLEX TO MICROSCOPIC EXAM IF INDICATED
Bilirubin, UA: NEGATIVE
Glucose, UA: NEGATIVE
Ketones UA: NEGATIVE
Nitrite, UA: POSITIVE — AB
Protein, UR: 30 — AB
Specific Gravity UA POCT: 1.005 (ref 1.001–1.035)
Urine pH: 6 (ref 5.0–8.0)
Urobilinogen, UA: NORMAL mg/dL

## 2010-02-13 LAB — COMPREHENSIVE METABOLIC PANEL
ALT: 78 U/L — ABNORMAL HIGH (ref 21–72)
AST (SGOT): 127 U/L — ABNORMAL HIGH (ref 8–39)
Albumin/Globulin Ratio: 0.7 — ABNORMAL LOW (ref 1.1–1.8)
Albumin: 3.3 g/dL — ABNORMAL LOW (ref 3.7–5.1)
Alkaline Phosphatase: 231 U/L — ABNORMAL HIGH (ref 43–122)
BUN: 45 mg/dL — ABNORMAL HIGH (ref 7–21)
Bilirubin, Total: 0.7 mg/dL (ref 0.2–1.3)
CO2: 25 mEq/L (ref 22–31)
Calcium: 8.5 mg/dL — ABNORMAL LOW (ref 8.6–10.2)
Chloride: 107 mEq/L (ref 98–107)
Creatinine: 1.4 mg/dL (ref 0.5–1.4)
Globulin: 5 g/dL — ABNORMAL HIGH (ref 2.0–3.7)
Glucose: 59 mg/dL — ABNORMAL LOW (ref 70–100)
Potassium: 4.5 mEq/L (ref 3.6–5.0)
Protein, Total: 8.3 g/dL — ABNORMAL HIGH (ref 6.0–8.0)
Sodium: 141 mEq/L (ref 136–143)

## 2010-02-14 LAB — COMPREHENSIVE METABOLIC PANEL
ALT: 59 U/L (ref 21–72)
AST (SGOT): 83 U/L — ABNORMAL HIGH (ref 8–39)
Albumin/Globulin Ratio: 0.6 — ABNORMAL LOW (ref 1.1–1.8)
Albumin: 2.6 g/dL — ABNORMAL LOW (ref 3.7–5.1)
Alkaline Phosphatase: 203 U/L — ABNORMAL HIGH (ref 43–122)
BUN: 31 mg/dL — ABNORMAL HIGH (ref 7–21)
Bilirubin, Total: 0.4 mg/dL (ref 0.2–1.3)
CO2: 23 mEq/L (ref 22–31)
Calcium: 7.9 mg/dL — ABNORMAL LOW (ref 8.6–10.2)
Chloride: 110 mEq/L — ABNORMAL HIGH (ref 98–107)
Creatinine: 1.1 mg/dL (ref 0.5–1.4)
Globulin: 4.4 g/dL — ABNORMAL HIGH (ref 2.0–3.7)
Glucose: 207 mg/dL — ABNORMAL HIGH (ref 70–100)
Potassium: 4.5 mEq/L (ref 3.6–5.0)
Protein, Total: 7 g/dL (ref 6.0–8.0)
Sodium: 139 mEq/L (ref 136–143)

## 2010-02-14 LAB — CBC
Hematocrit: 28.9 % — ABNORMAL LOW (ref 37.0–47.0)
Hgb: 9.5 g/dL — ABNORMAL LOW (ref 12.0–16.0)
MCH: 29.3 pg (ref 28.0–32.0)
MCHC: 32.9 g/dL (ref 32.0–36.0)
MCV: 89.2 fL (ref 80.0–100.0)
MPV: 11.5 fL (ref 9.4–12.3)
Platelets: 154 10*3/uL (ref 140–400)
RBC: 3.24 10*6/uL — ABNORMAL LOW (ref 4.20–5.40)
RDW: 16 % — ABNORMAL HIGH (ref 12–15)
WBC: 14.6 10*3/uL — ABNORMAL HIGH (ref 3.50–10.80)

## 2010-02-14 LAB — GFR: EGFR: 56.2

## 2010-02-15 LAB — BASIC METABOLIC PANEL
BUN: 21 mg/dL (ref 7–21)
CO2: 24 mEq/L (ref 22–31)
Calcium: 7.6 mg/dL — ABNORMAL LOW (ref 8.6–10.2)
Chloride: 107 mEq/L (ref 98–107)
Creatinine: 0.9 mg/dL (ref 0.5–1.4)
Glucose: 265 mg/dL — ABNORMAL HIGH (ref 70–100)
Potassium: 3.9 mEq/L (ref 3.6–5.0)
Sodium: 137 mEq/L (ref 136–143)

## 2010-02-15 LAB — CBC AND DIFFERENTIAL
Baso(Absolute): 0.01 10*3/uL (ref 0.00–0.20)
Basophils: 0 % (ref 0–2)
Eosinophils Absolute: 0 10*3/uL (ref 0.00–0.70)
Eosinophils: 0 % (ref 0–5)
Hematocrit: 27.7 % — ABNORMAL LOW (ref 37.0–47.0)
Hgb: 9.1 g/dL — ABNORMAL LOW (ref 12.0–16.0)
Immature Granulocytes Absolute: 0.03 10*3/uL
Immature Granulocytes: 0 % (ref 0–1)
Lymphocytes Absolute: 0.86 10*3/uL (ref 0.50–4.40)
Lymphocytes: 8 % — ABNORMAL LOW (ref 15–41)
MCH: 29.1 pg (ref 28.0–32.0)
MCHC: 32.9 g/dL (ref 32.0–36.0)
MCV: 88.5 fL (ref 80.0–100.0)
MPV: 12.8 fL — ABNORMAL HIGH (ref 9.4–12.3)
Monocytes Absolute: 0.43 10*3/uL (ref 0.00–1.20)
Monocytes: 4 % (ref 0–11)
Neutrophils Absolute: 8.85 10*3/uL
Neutrophils: 87 % — ABNORMAL HIGH (ref 52–75)
Platelets: 172 10*3/uL (ref 140–400)
RBC: 3.13 10*6/uL — ABNORMAL LOW (ref 4.20–5.40)
RDW: 15 % (ref 12–15)
WBC: 10.15 10*3/uL (ref 3.50–10.80)

## 2010-02-15 LAB — HEMOGLOBIN A1C: Hemoglobin A1C: 8.2 % — ABNORMAL HIGH (ref 0.0–6.0)

## 2010-02-15 LAB — MAGNESIUM: Magnesium: 1.4 mg/dL — ABNORMAL LOW (ref 1.6–2.3)

## 2010-02-15 LAB — PHOSPHORUS: Phosphorus: 2.8 mg/dL (ref 2.5–4.5)

## 2010-02-15 LAB — GFR: EGFR: >60

## 2010-02-16 LAB — CBC
Hematocrit: 30.1 % — ABNORMAL LOW (ref 37.0–47.0)
Hgb: 9.9 g/dL — ABNORMAL LOW (ref 12.0–16.0)
MCH: 28.7 pg (ref 28.0–32.0)
MCHC: 32.9 g/dL (ref 32.0–36.0)
MCV: 87.2 fL (ref 80.0–100.0)
MPV: 12.3 fL (ref 9.4–12.3)
Platelets: 190 10*3/uL (ref 140–400)
RBC: 3.45 10*6/uL — ABNORMAL LOW (ref 4.20–5.40)
RDW: 15 % (ref 12–15)
WBC: 10.97 10*3/uL — ABNORMAL HIGH (ref 3.50–10.80)

## 2010-02-16 LAB — LIPID PANEL
Cholesterol / HDL Ratio: 7 Index
Cholesterol: 195 mg/dL (ref 0–199)
HDL: 28 mg/dL — ABNORMAL LOW (ref >40)
LDL Calculated: 140 mg/dL — ABNORMAL HIGH (ref 0–99)
Triglycerides: 137 mg/dL (ref 34–149)
VLDL Calculated: 27 mg/dL (ref 10–40)

## 2010-02-16 LAB — BASIC METABOLIC PANEL
BUN: 25 mg/dL — ABNORMAL HIGH (ref 7–21)
CO2: 25 mEq/L (ref 22–31)
Calcium: 8.1 mg/dL — ABNORMAL LOW (ref 8.6–10.2)
Chloride: 103 mEq/L (ref 98–107)
Creatinine: 0.9 mg/dL (ref 0.5–1.4)
Glucose: 286 mg/dL — ABNORMAL HIGH (ref 70–100)
Potassium: 3.5 mEq/L — ABNORMAL LOW (ref 3.6–5.0)
Sodium: 137 mEq/L (ref 136–143)

## 2010-02-16 LAB — CK
Creatine Kinase (CK): 66 U/L (ref 20–140)
Creatine Kinase (CK): 72 U/L (ref 20–140)
Creatine Kinase (CK): 60 U/L (ref 20–140)

## 2010-02-16 LAB — TROPONIN I
Troponin I: <0.01 ng/mL (ref 0.00–0.03)
Troponin I: <0.01 ng/mL (ref 0.00–0.03)
Troponin I: <0.01 ng/mL (ref 0.00–0.03)

## 2010-02-16 LAB — HEMOLYSIS INDEX: Hemolysis Index: 9 Index (ref 0–9)

## 2010-02-16 LAB — HEMOGLOBIN A1C: Hemoglobin A1C: 8.5 % — ABNORMAL HIGH (ref 0.0–6.0)

## 2010-02-16 LAB — GFR: EGFR: >60

## 2010-02-16 LAB — B-TYPE NATRIURETIC PEPTIDE: B-Natriuretic Peptide: 498 pg/mL — ABNORMAL HIGH (ref 0–100)

## 2010-02-17 LAB — CK: Creatine Kinase (CK): 42 U/L (ref 20–140)

## 2010-02-17 LAB — TROPONIN I: Troponin I: <0.01 ng/mL (ref 0.00–0.03)

## 2010-03-08 ENCOUNTER — Inpatient Hospital Stay
Admission: EM | Admit: 2010-03-08 | Disposition: A | Discharge status: Home / Self Care | Payer: Self-pay | Source: Ambulatory Visit | Admitting: Internal Medicine

## 2010-03-08 LAB — COMPREHENSIVE METABOLIC PANEL
ALT: 43 U/L (ref 21–72)
AST (SGOT): 109 U/L — ABNORMAL HIGH (ref 8–39)
Albumin/Globulin Ratio: 0.6 — ABNORMAL LOW (ref 1.1–1.8)
Albumin: 2.6 g/dL — ABNORMAL LOW (ref 3.7–5.1)
Alkaline Phosphatase: 193 U/L — ABNORMAL HIGH (ref 43–122)
BUN: 19 mg/dL (ref 7–21)
Bilirubin, Total: 0.5 mg/dL (ref 0.2–1.3)
CO2: 28 mEq/L (ref 22–31)
Calcium: 8.3 mg/dL — ABNORMAL LOW (ref 8.6–10.2)
Chloride: 104 mEq/L (ref 98–107)
Creatinine: 0.9 mg/dL (ref 0.5–1.4)
Globulin: 4.5 g/dL — ABNORMAL HIGH (ref 2.0–3.7)
Glucose: 79 mg/dL (ref 70–100)
Potassium: 3.9 mEq/L (ref 3.6–5.0)
Protein, Total: 7.1 g/dL (ref 6.0–8.0)
Sodium: 137 mEq/L (ref 136–143)

## 2010-03-08 LAB — CBC AND DIFFERENTIAL
Baso(Absolute): 0.04 10*3/uL (ref 0.00–0.20)
Basophils: 0 % (ref 0–2)
Eosinophils Absolute: 0.02 10*3/uL (ref 0.00–0.70)
Eosinophils: 0 % (ref 0–5)
Hematocrit: 27 % — ABNORMAL LOW (ref 37.0–47.0)
Hgb: 8.8 g/dL — ABNORMAL LOW (ref 12.0–16.0)
Immature Granulocytes Absolute: 0.06 10*3/uL — ABNORMAL HIGH
Immature Granulocytes: 0 % (ref 0–1)
Lymphocytes Absolute: 1.39 10*3/uL (ref 0.50–4.40)
Lymphocytes: 9 % — ABNORMAL LOW (ref 15–41)
MCH: 29.4 pg (ref 28.0–32.0)
MCHC: 32.6 g/dL (ref 32.0–36.0)
MCV: 90.3 fL (ref 80.0–100.0)
MPV: 10.7 fL (ref 9.4–12.3)
Monocytes Absolute: 1.78 10*3/uL — ABNORMAL HIGH (ref 0.00–1.20)
Monocytes: 12 % — ABNORMAL HIGH (ref 0–11)
Neutrophils Absolute: 11.66 10*3/uL
Neutrophils: 78 % — ABNORMAL HIGH (ref 52–75)
Platelets: 281 10*3/uL (ref 140–400)
RBC: 2.99 10*6/uL — ABNORMAL LOW (ref 4.20–5.40)
RDW: 16 % — ABNORMAL HIGH (ref 12–15)
WBC: 14.89 10*3/uL — ABNORMAL HIGH (ref 3.50–10.80)

## 2010-03-08 LAB — CELL MORPHOLOGY: Cell Morphology: ABNORMAL — AB

## 2010-03-08 LAB — ACETONE: Acetone Blood: NEGATIVE

## 2010-03-08 LAB — GFR: EGFR: >60

## 2010-03-09 LAB — CBC
Hematocrit: 26.9 % — ABNORMAL LOW (ref 37.0–47.0)
Hgb: 8.6 g/dL — ABNORMAL LOW (ref 12.0–16.0)
MCH: 28.8 pg (ref 28.0–32.0)
MCHC: 32 g/dL (ref 32.0–36.0)
MCV: 90 fL (ref 80.0–100.0)
MPV: 11 fL (ref 9.4–12.3)
Platelets: 267 10*3/uL (ref 140–400)
RBC: 2.99 10*6/uL — ABNORMAL LOW (ref 4.20–5.40)
RDW: 16 % — ABNORMAL HIGH (ref 12–15)
WBC: 11.41 10*3/uL — ABNORMAL HIGH (ref 3.50–10.80)

## 2010-03-09 LAB — URINALYSIS, REFLEX TO MICROSCOPIC EXAM IF INDICATED
Bilirubin, UA: NEGATIVE
Blood, UA: NEGATIVE
Glucose, UA: 50 — AB
Ketones UA: NEGATIVE
Leukocyte Esterase, UA: NEGATIVE
Nitrite, UA: NEGATIVE
Protein, UR: 30 — AB
Specific Gravity UA POCT: 1.005 (ref 1.001–1.035)
Urine pH: 6 (ref 5.0–8.0)
Urobilinogen, UA: NORMAL mg/dL

## 2010-03-09 LAB — GFR: EGFR: >60

## 2010-03-09 LAB — BASIC METABOLIC PANEL
BUN: 14 mg/dL (ref 7–21)
CO2: 26 mEq/L (ref 22–31)
Calcium: 8 mg/dL — ABNORMAL LOW (ref 8.6–10.2)
Chloride: 106 mEq/L (ref 98–107)
Creatinine: 0.8 mg/dL (ref 0.5–1.4)
Glucose: 106 mg/dL — ABNORMAL HIGH (ref 70–100)
Potassium: 4 mEq/L (ref 3.6–5.0)
Sodium: 138 mEq/L (ref 136–143)

## 2010-03-10 LAB — HEPATIC FUNCTION PANEL
ALT: 51 U/L (ref 21–72)
AST (SGOT): 84 U/L — ABNORMAL HIGH (ref 8–39)
Albumin/Globulin Ratio: 0.5 — ABNORMAL LOW (ref 1.1–1.8)
Albumin: 2.1 g/dL — ABNORMAL LOW (ref 3.7–5.1)
Alkaline Phosphatase: 226 U/L — ABNORMAL HIGH (ref 43–122)
Bilirubin Direct: 0.3 mg/dL (ref 0.0–0.3)
Bilirubin Indirect: 0 mg/dL (ref 0.0–1.1)
Bilirubin, Total: 0.3 mg/dL (ref 0.2–1.3)
Globulin: 4 g/dL — ABNORMAL HIGH (ref 2.0–3.7)
Protein, Total: 6.1 g/dL (ref 6.0–8.0)

## 2010-03-10 LAB — CBC AND DIFFERENTIAL
Baso(Absolute): 0.01 10*3/uL (ref 0.00–0.20)
Basophils: 0 % (ref 0–2)
Eosinophils Absolute: 0.29 10*3/uL (ref 0.00–0.70)
Eosinophils: 3 % (ref 0–5)
Hematocrit: 25.3 % — ABNORMAL LOW (ref 37.0–47.0)
Hgb: 8.3 g/dL — ABNORMAL LOW (ref 12.0–16.0)
Immature Granulocytes Absolute: 0.04 10*3/uL
Immature Granulocytes: 0 % (ref 0–1)
Lymphocytes Absolute: 1.26 10*3/uL (ref 0.50–4.40)
Lymphocytes: 14 % — ABNORMAL LOW (ref 15–41)
MCH: 29 pg (ref 28.0–32.0)
MCHC: 32.8 g/dL (ref 32.0–36.0)
MCV: 88.5 fL (ref 80.0–100.0)
MPV: 10.7 fL (ref 9.4–12.3)
Monocytes Absolute: 1.22 10*3/uL — ABNORMAL HIGH (ref 0.00–1.20)
Monocytes: 13 % — ABNORMAL HIGH (ref 0–11)
Neutrophils Absolute: 6.45 10*3/uL
Neutrophils: 70 % (ref 52–75)
Platelets: 258 10*3/uL (ref 140–400)
RBC: 2.86 10*6/uL — ABNORMAL LOW (ref 4.20–5.40)
RDW: 15 % (ref 12–15)
WBC: 9.23 10*3/uL (ref 3.50–10.80)

## 2010-03-10 LAB — GFR: EGFR: >60

## 2010-03-10 LAB — BASIC METABOLIC PANEL
BUN: 14 mg/dL (ref 7–21)
CO2: 28 mEq/L (ref 22–31)
Calcium: 7.7 mg/dL — ABNORMAL LOW (ref 8.6–10.2)
Chloride: 105 mEq/L (ref 98–107)
Creatinine: 0.9 mg/dL (ref 0.5–1.4)
Glucose: 102 mg/dL — ABNORMAL HIGH (ref 70–100)
Potassium: 3.8 mEq/L (ref 3.6–5.0)
Sodium: 137 mEq/L (ref 136–143)

## 2010-03-10 LAB — LIPASE: Lipase: 87 U/L (ref 23–300)

## 2010-03-10 LAB — MAGNESIUM: Magnesium: 1.2 mg/dL — ABNORMAL LOW (ref 1.6–2.3)

## 2010-03-10 LAB — AMYLASE: Amylase: 41 U/L (ref 30–110)

## 2010-03-10 LAB — B-TYPE NATRIURETIC PEPTIDE: B-Natriuretic Peptide: 204 pg/mL — ABNORMAL HIGH (ref 0–100)

## 2010-03-11 LAB — COMPREHENSIVE METABOLIC PANEL
ALT: 45 U/L (ref 21–72)
AST (SGOT): 59 U/L — ABNORMAL HIGH (ref 8–39)
Albumin/Globulin Ratio: 0.6 — ABNORMAL LOW (ref 1.1–1.8)
Albumin: 2.4 g/dL — ABNORMAL LOW (ref 3.7–5.1)
Alkaline Phosphatase: 268 U/L — ABNORMAL HIGH (ref 43–122)
BUN: 18 mg/dL (ref 7–21)
Bilirubin, Total: 0.2 mg/dL (ref 0.2–1.3)
CO2: 27 mEq/L (ref 22–31)
Calcium: 8.1 mg/dL — ABNORMAL LOW (ref 8.6–10.2)
Chloride: 103 mEq/L (ref 98–107)
Creatinine: 0.9 mg/dL (ref 0.5–1.4)
Globulin: 4.3 g/dL — ABNORMAL HIGH (ref 2.0–3.7)
Glucose: 283 mg/dL — ABNORMAL HIGH (ref 70–100)
Potassium: 4.4 mEq/L (ref 3.6–5.0)
Protein, Total: 6.7 g/dL (ref 6.0–8.0)
Sodium: 136 mEq/L (ref 136–143)

## 2010-03-11 LAB — CBC AND DIFFERENTIAL
Baso(Absolute): 0.01 10*3/uL (ref 0.00–0.20)
Basophils: 0 % (ref 0–2)
Eosinophils Absolute: 0.01 10*3/uL (ref 0.00–0.70)
Eosinophils: 0 % (ref 0–5)
Hematocrit: 26.8 % — ABNORMAL LOW (ref 37.0–47.0)
Hgb: 8.7 g/dL — ABNORMAL LOW (ref 12.0–16.0)
Immature Granulocytes Absolute: 0.02 10*3/uL
Immature Granulocytes: 0 % (ref 0–1)
Lymphocytes Absolute: 0.75 10*3/uL (ref 0.50–4.40)
Lymphocytes: 10 % — ABNORMAL LOW (ref 15–41)
MCH: 28.6 pg (ref 28.0–32.0)
MCHC: 32.5 g/dL (ref 32.0–36.0)
MCV: 88.2 fL (ref 80.0–100.0)
MPV: 11.3 fL (ref 9.4–12.3)
Monocytes Absolute: 0.3 10*3/uL (ref 0.00–1.20)
Monocytes: 4 % (ref 0–11)
Neutrophils Absolute: 6.83 10*3/uL
Neutrophils: 87 % — ABNORMAL HIGH (ref 52–75)
Platelets: 304 10*3/uL (ref 140–400)
RBC: 3.04 10*6/uL — ABNORMAL LOW (ref 4.20–5.40)
RDW: 15 % (ref 12–15)
WBC: 7.9 10*3/uL (ref 3.50–10.80)

## 2010-03-11 LAB — GFR: EGFR: >60

## 2010-03-11 LAB — URINALYSIS, REFLEX TO MICROSCOPIC EXAM IF INDICATED
Bilirubin, UA: NEGATIVE
Blood, UA: NEGATIVE
Glucose, UA: 150 — AB
Ketones UA: NEGATIVE
Leukocyte Esterase, UA: NEGATIVE
Nitrite, UA: NEGATIVE
Protein, UR: NEGATIVE
Specific Gravity UA POCT: 1.005 (ref 1.001–1.035)
Urine pH: 6 (ref 5.0–8.0)
Urobilinogen, UA: NORMAL mg/dL

## 2010-03-11 LAB — MAGNESIUM: Magnesium: 1.6 mg/dL (ref 1.6–2.3)

## 2010-03-11 LAB — URIC ACID: Uric acid: 6.8 mg/dL (ref 3.5–8.5)

## 2010-03-12 LAB — RETICULOCYTES
Immature Platelet Fraction: 6.9 % (ref 0.9–11.2)
Immature Retic Fract: 8.7 % (ref 3.0–15.9)
Reticulocyte Count Absolute: 0.039 10*6/uL (ref 0.0210–0.1350)
Reticulocyte Count Automated: 1.4 % (ref 0.5–2.5)
Reticulocyte Hemoglobin: 28.2 pg (ref 28.2–36.6)

## 2010-03-12 LAB — CBC AND DIFFERENTIAL
Baso(Absolute): 0 10*3/uL (ref 0.00–0.20)
Basophils: 0 % (ref 0–2)
Eosinophils Absolute: 0 10*3/uL (ref 0.00–0.70)
Eosinophils: 0 % (ref 0–5)
Hematocrit: 24.8 % — ABNORMAL LOW (ref 37.0–47.0)
Hgb: 8.2 g/dL — ABNORMAL LOW (ref 12.0–16.0)
Immature Granulocytes Absolute: 0.04 10*3/uL
Immature Granulocytes: 0 % (ref 0–1)
Lymphocytes Absolute: 0.98 10*3/uL (ref 0.50–4.40)
Lymphocytes: 10 % — ABNORMAL LOW (ref 15–41)
MCH: 28.9 pg (ref 28.0–32.0)
MCHC: 33.1 g/dL (ref 32.0–36.0)
MCV: 87.3 fL (ref 80.0–100.0)
MPV: 10.9 fL (ref 9.4–12.3)
Monocytes Absolute: 0.55 10*3/uL (ref 0.00–1.20)
Monocytes: 6 % (ref 0–11)
Neutrophils Absolute: 8.18 10*3/uL
Neutrophils: 84 % — ABNORMAL HIGH (ref 52–75)
Platelets: 326 10*3/uL (ref 140–400)
RBC: 2.84 10*6/uL — ABNORMAL LOW (ref 4.20–5.40)
RDW: 15 % (ref 12–15)
WBC: 9.71 10*3/uL (ref 3.50–10.80)

## 2010-03-12 LAB — COMPREHENSIVE METABOLIC PANEL
ALT: 43 U/L (ref 21–72)
AST (SGOT): 46 U/L — ABNORMAL HIGH (ref 8–39)
Albumin/Globulin Ratio: 0.6 — ABNORMAL LOW (ref 1.1–1.8)
Albumin: 2.4 g/dL — ABNORMAL LOW (ref 3.7–5.1)
Alkaline Phosphatase: 245 U/L — ABNORMAL HIGH (ref 43–122)
BUN: 19 mg/dL (ref 7–21)
Bilirubin, Total: 0.2 mg/dL (ref 0.2–1.3)
CO2: 28 mEq/L (ref 22–31)
Calcium: 8 mg/dL — ABNORMAL LOW (ref 8.6–10.2)
Chloride: 103 mEq/L (ref 98–107)
Creatinine: 0.9 mg/dL (ref 0.5–1.4)
Globulin: 4 g/dL — ABNORMAL HIGH (ref 2.0–3.7)
Glucose: 318 mg/dL — ABNORMAL HIGH (ref 70–100)
Potassium: 3.7 mEq/L (ref 3.6–5.0)
Protein, Total: 6.4 g/dL (ref 6.0–8.0)
Sodium: 138 mEq/L (ref 136–143)

## 2010-03-12 LAB — VITAMIN B12: Vitamin B-12: 1468 pg/mL — ABNORMAL HIGH (ref 211–911)

## 2010-03-12 LAB — IRON PROFILE
Iron Saturation: 40 % (ref 15–50)
Iron: 67 ug/dL (ref 40–145)
TIBC: 166 ug/dL — ABNORMAL LOW (ref 265–497)
UIBC: 99 ug/dL — ABNORMAL LOW (ref 126–382)

## 2010-03-12 LAB — FERRITIN: Ferritin: 197.82 ng/mL (ref 4.60–204.00)

## 2010-03-12 LAB — MAGNESIUM: Magnesium: 1.6 mg/dL (ref 1.6–2.3)

## 2010-03-12 LAB — FOLATE: Folate: 16.4 ng/mL

## 2010-03-12 LAB — GFR: EGFR: >60

## 2010-03-12 LAB — HEMOLYSIS INDEX: Hemolysis Index: 2 Index (ref 0–9)

## 2010-03-13 LAB — COMPREHENSIVE METABOLIC PANEL
ALT: 36 U/L (ref 21–72)
AST (SGOT): 39 U/L (ref 8–39)
Albumin/Globulin Ratio: 0.6 — ABNORMAL LOW (ref 1.1–1.8)
Albumin: 2.3 g/dL — ABNORMAL LOW (ref 3.7–5.1)
Alkaline Phosphatase: 216 U/L — ABNORMAL HIGH (ref 43–122)
BUN: 24 mg/dL — ABNORMAL HIGH (ref 7–21)
Bilirubin, Total: 0.1 mg/dL — ABNORMAL LOW (ref 0.2–1.3)
CO2: 28 mEq/L (ref 22–31)
Calcium: 7.8 mg/dL — ABNORMAL LOW (ref 8.6–10.2)
Chloride: 104 mEq/L (ref 98–107)
Creatinine: 1 mg/dL (ref 0.5–1.4)
Globulin: 4 g/dL — ABNORMAL HIGH (ref 2.0–3.7)
Glucose: 263 mg/dL — ABNORMAL HIGH (ref 70–100)
Potassium: 3.5 mEq/L — ABNORMAL LOW (ref 3.6–5.0)
Protein, Total: 6.3 g/dL (ref 6.0–8.0)
Sodium: 139 mEq/L (ref 136–143)

## 2010-03-13 LAB — ERYTHROPOIETIN: Erythropoietin: 51 m[IU]/mL — ABNORMAL HIGH (ref 4.1–19.5)

## 2010-03-13 LAB — CBC AND DIFFERENTIAL
Baso(Absolute): 0.01 10*3/uL (ref 0.00–0.20)
Basophils: 0 % (ref 0–2)
Eosinophils Absolute: 0.03 10*3/uL (ref 0.00–0.70)
Eosinophils: 0 % (ref 0–5)
Hematocrit: 25.2 % — ABNORMAL LOW (ref 37.0–47.0)
Hgb: 8.1 g/dL — ABNORMAL LOW (ref 12.0–16.0)
Immature Granulocytes Absolute: 0.08 10*3/uL — ABNORMAL HIGH
Immature Granulocytes: 1 % (ref 0–1)
Lymphocytes Absolute: 1.2 10*3/uL (ref 0.50–4.40)
Lymphocytes: 12 % — ABNORMAL LOW (ref 15–41)
MCH: 28.5 pg (ref 28.0–32.0)
MCHC: 32.1 g/dL (ref 32.0–36.0)
MCV: 88.7 fL (ref 80.0–100.0)
MPV: 11 fL (ref 9.4–12.3)
Monocytes Absolute: 0.76 10*3/uL (ref 0.00–1.20)
Monocytes: 7 % (ref 0–11)
Neutrophils Absolute: 8.26 10*3/uL
Neutrophils: 81 % — ABNORMAL HIGH (ref 52–75)
Platelets: 343 10*3/uL (ref 140–400)
RBC: 2.84 10*6/uL — ABNORMAL LOW (ref 4.20–5.40)
RDW: 15 % (ref 12–15)
WBC: 10.26 10*3/uL (ref 3.50–10.80)

## 2010-03-13 LAB — MAGNESIUM: Magnesium: 1.7 mg/dL (ref 1.6–2.3)

## 2010-03-13 LAB — GFR: EGFR: >60

## 2010-03-13 LAB — C-REACTIVE PROTEIN: C-Reactive Protein: 2.4 mg/dL — ABNORMAL HIGH (ref 0.0–0.8)

## 2010-03-14 LAB — MAN DIFF ONLY
Myelocytes Absolute: 0.11 10*3/uL — ABNORMAL HIGH
Myelocytes: 1 % — ABNORMAL HIGH
Nucleated RBC Absolute: 0 10*3/uL

## 2010-03-14 LAB — CBC AND DIFFERENTIAL
Band Neutrophils: 0 % (ref 0–9)
Baso(Absolute): 0 10*3/uL (ref 0.00–0.20)
Basophils: 0 % (ref 0–2)
Eosinophils Absolute: 0 10*3/uL (ref 0.00–0.70)
Eosinophils: 0 % (ref 0–5)
Hematocrit: 24.5 % — ABNORMAL LOW (ref 37.0–47.0)
Hgb: 7.8 g/dL — ABNORMAL LOW (ref 12.0–16.0)
Lymphocytes Absolute: 1.35 10*3/uL (ref 0.50–4.40)
Lymphocytes: 12 % — ABNORMAL LOW (ref 15–41)
MCH: 28.1 pg (ref 28.0–32.0)
MCHC: 31.8 g/dL — ABNORMAL LOW (ref 32.0–36.0)
MCV: 88.1 fL (ref 80.0–100.0)
MPV: 10.9 fL (ref 9.4–12.3)
Monocytes Absolute: 1.35 10*3/uL — ABNORMAL HIGH (ref 0.00–1.20)
Monocytes: 12 % — ABNORMAL HIGH (ref 0–11)
Neutrophils Absolute Count: 8.43 10*3/uL — ABNORMAL HIGH (ref 1.80–8.10)
Neutrophils: 75 % (ref 52–75)
Nucleated RBC: 0 /100 WBC (ref 0–1)
Platelets: 341 10*3/uL (ref 140–400)
RBC: 2.78 10*6/uL — ABNORMAL LOW (ref 4.20–5.40)
RDW: 15 % (ref 12–15)
WBC: 11.24 10*3/uL — ABNORMAL HIGH (ref 3.50–10.80)

## 2010-03-14 LAB — GFR: EGFR: >60

## 2010-03-14 LAB — COMPREHENSIVE METABOLIC PANEL
ALT: 33 U/L (ref 21–72)
AST (SGOT): 37 U/L (ref 8–39)
Albumin/Globulin Ratio: 0.6 — ABNORMAL LOW (ref 1.1–1.8)
Albumin: 2.2 g/dL — ABNORMAL LOW (ref 3.7–5.1)
Alkaline Phosphatase: 202 U/L — ABNORMAL HIGH (ref 43–122)
BUN: 28 mg/dL — ABNORMAL HIGH (ref 7–21)
Bilirubin, Total: 0.1 mg/dL — ABNORMAL LOW (ref 0.2–1.3)
CO2: 28 mEq/L (ref 22–31)
Calcium: 7.8 mg/dL — ABNORMAL LOW (ref 8.6–10.2)
Chloride: 104 mEq/L (ref 98–107)
Creatinine: 1 mg/dL (ref 0.5–1.4)
Globulin: 4 g/dL — ABNORMAL HIGH (ref 2.0–3.7)
Glucose: 217 mg/dL — ABNORMAL HIGH (ref 70–100)
Potassium: 3.3 mEq/L — ABNORMAL LOW (ref 3.6–5.0)
Protein, Total: 6.2 g/dL (ref 6.0–8.0)
Sodium: 139 mEq/L (ref 136–143)

## 2010-03-14 LAB — CELL MORPHOLOGY: Cell Morphology: NORMAL

## 2010-03-15 LAB — COMPREHENSIVE METABOLIC PANEL
ALT: 36 U/L (ref 21–72)
AST (SGOT): 39 U/L (ref 8–39)
Albumin/Globulin Ratio: 0.6 — ABNORMAL LOW (ref 1.1–1.8)
Albumin: 2.3 g/dL — ABNORMAL LOW (ref 3.7–5.1)
Alkaline Phosphatase: 189 U/L — ABNORMAL HIGH (ref 43–122)
BUN: 22 mg/dL — ABNORMAL HIGH (ref 7–21)
Bilirubin, Total: 0.2 mg/dL (ref 0.2–1.3)
CO2: 30 mEq/L (ref 22–31)
Calcium: 7.8 mg/dL — ABNORMAL LOW (ref 8.6–10.2)
Chloride: 105 mEq/L (ref 98–107)
Creatinine: 0.9 mg/dL (ref 0.5–1.4)
Globulin: 3.8 g/dL — ABNORMAL HIGH (ref 2.0–3.7)
Glucose: 88 mg/dL (ref 70–100)
Potassium: 3.4 mEq/L — ABNORMAL LOW (ref 3.6–5.0)
Protein, Total: 6.1 g/dL (ref 6.0–8.0)
Sodium: 139 mEq/L (ref 136–143)

## 2010-03-15 LAB — CBC AND DIFFERENTIAL
Band Neutrophils: 0 % (ref 0–9)
Baso(Absolute): 0 10*3/uL (ref 0.00–0.20)
Basophils: 0 % (ref 0–2)
Eosinophils Absolute: 0.26 10*3/uL (ref 0.00–0.70)
Eosinophils: 2 % (ref 0–5)
Hematocrit: 25.6 % — ABNORMAL LOW (ref 37.0–47.0)
Hgb: 8.3 g/dL — ABNORMAL LOW (ref 12.0–16.0)
Lymphocytes Absolute: 1.44 10*3/uL (ref 0.50–4.40)
Lymphocytes: 11 % — ABNORMAL LOW (ref 15–41)
MCH: 28.9 pg (ref 28.0–32.0)
MCHC: 32.4 g/dL (ref 32.0–36.0)
MCV: 89.2 fL (ref 80.0–100.0)
MPV: 10.3 fL (ref 9.4–12.3)
Monocytes Absolute: 0.52 10*3/uL (ref 0.00–1.20)
Monocytes: 4 % (ref 0–11)
Neutrophils Absolute Count: 10.86 10*3/uL — ABNORMAL HIGH (ref 1.80–8.10)
Neutrophils: 83 % — ABNORMAL HIGH (ref 52–75)
Nucleated RBC: 0 /100 WBC (ref 0–1)
Platelets: 343 10*3/uL (ref 140–400)
RBC: 2.87 10*6/uL — ABNORMAL LOW (ref 4.20–5.40)
RDW: 16 % — ABNORMAL HIGH (ref 12–15)
WBC: 13.08 10*3/uL — ABNORMAL HIGH (ref 3.50–10.80)

## 2010-03-15 LAB — CELL MORPHOLOGY: Cell Morphology: ABNORMAL — AB

## 2010-03-15 LAB — MAN DIFF ONLY: Nucleated RBC Absolute: 0 10*3/uL

## 2010-03-15 LAB — GFR: EGFR: >60

## 2010-03-17 LAB — KAP/LMBDA LGHT CH, FR W/RATIO, RFX IMFIX
Free Kappa, Serum: 59.6 mg/L — ABNORMAL HIGH (ref 3.3–19.4)
Free Kappa/Lambda Ratio: 1.91 — ABNORMAL HIGH (ref 0.26–1.65)
Free Lambda, Serum: 31.2 mg/L — ABNORMAL HIGH (ref 5.7–26.3)

## 2010-03-27 LAB — SERUM IFE REVIEW (SOFT)

## 2010-03-27 LAB — IMMUNOFIXATION ELECTROPHORESIS

## 2010-05-11 ENCOUNTER — Ambulatory Visit
Admit: 2010-05-11 | Disposition: A | Discharge status: Home / Self Care | Payer: Self-pay | Source: Ambulatory Visit | Attending: Hematology & Oncology | Admitting: Hematology & Oncology

## 2010-05-11 LAB — TYPE AND SCREEN
AB Screen Gel: NEGATIVE
ABO Rh: O POS

## 2010-05-14 LAB — PREPARE RBC

## 2010-07-28 ENCOUNTER — Inpatient Hospital Stay
Admission: EM | Admit: 2010-07-28 | Disposition: A | Discharge status: Home / Self Care | Payer: Self-pay | Source: Ambulatory Visit | Attending: Internal Medicine | Admitting: Internal Medicine

## 2010-07-28 LAB — CBC AND DIFFERENTIAL
Basophils Absolute Automated: 0.03 10*3/uL (ref 0.00–0.20)
Basophils Automated: 0 % (ref 0–2)
Eosinophils Absolute Automated: 0.05 10*3/uL (ref 0.00–0.70)
Eosinophils Automated: 0 % (ref 0–5)
Hematocrit: 34.6 % — ABNORMAL LOW (ref 37.0–47.0)
Hgb: 11.4 g/dL — ABNORMAL LOW (ref 12.0–16.0)
Immature Granulocytes Absolute: 0.13 10*3/uL — ABNORMAL HIGH
Immature Granulocytes: 1 % (ref 0–1)
Lymphocytes Absolute Automated: 1.39 10*3/uL (ref 0.50–4.40)
Lymphocytes Automated: 6 % — ABNORMAL LOW (ref 15–41)
MCH: 30.5 pg (ref 28.0–32.0)
MCHC: 32.9 g/dL (ref 32.0–36.0)
MCV: 92.5 fL (ref 80.0–100.0)
MPV: 10.6 fL (ref 9.4–12.3)
Monocytes Absolute Automated: 1.78 10*3/uL — ABNORMAL HIGH (ref 0.00–1.20)
Monocytes: 8 % (ref 0–11)
Neutrophils Absolute: 19.38 10*3/uL — ABNORMAL HIGH (ref 1.80–8.10)
Neutrophils: 86 % — ABNORMAL HIGH (ref 52–75)
Nucleated RBC: 0 /100 WBC
Platelets: 256 10*3/uL (ref 140–400)
RBC: 3.74 10*6/uL — ABNORMAL LOW (ref 4.20–5.40)
RDW: 18 % — ABNORMAL HIGH (ref 12–15)
WBC: 22.63 10*3/uL — ABNORMAL HIGH (ref 3.50–10.80)

## 2010-07-28 LAB — COMPREHENSIVE METABOLIC PANEL
ALT: 248 U/L — ABNORMAL HIGH (ref 21–72)
AST (SGOT): 284 U/L — ABNORMAL HIGH (ref 8–39)
Albumin/Globulin Ratio: 0.8 — ABNORMAL LOW (ref 1.1–1.8)
Albumin: 3.6 g/dL — ABNORMAL LOW (ref 3.7–5.1)
Alkaline Phosphatase: 418 U/L — ABNORMAL HIGH (ref 43–122)
BUN: 32 mg/dL — ABNORMAL HIGH (ref 7–21)
Bilirubin, Total: 2.3 mg/dL — ABNORMAL HIGH (ref 0.2–1.3)
CO2: 26 mEq/L (ref 22–31)
Calcium: 8.6 mg/dL (ref 8.6–10.2)
Chloride: 102 mEq/L (ref 98–107)
Creatinine: 1.6 mg/dL — ABNORMAL HIGH (ref 0.5–1.4)
Globulin: 4.6 g/dL — ABNORMAL HIGH (ref 2.0–3.7)
Glucose: 79 mg/dL (ref 70–100)
Potassium: 4.1 mEq/L (ref 3.6–5.0)
Protein, Total: 8.2 g/dL — ABNORMAL HIGH (ref 6.0–8.0)
Sodium: 140 mEq/L (ref 136–143)

## 2010-07-28 LAB — STOOL OCCULT BLOOD: Stool Occult Blood: NEGATIVE

## 2010-07-28 LAB — URINALYSIS, REFLEX TO MICROSCOPIC EXAM IF INDICATED
Bilirubin, UA: NEGATIVE
Glucose, UA: NEGATIVE
Ketones UA: NEGATIVE
Nitrite, UA: NEGATIVE
Protein, UR: 30 — AB
Specific Gravity UA POCT: 1.01 (ref 1.001–1.035)
Urine pH: 6 (ref 5.0–8.0)
Urobilinogen, UA: 2 mg/dL

## 2010-07-28 LAB — STOOL FOR WBC
Eosinophils Wright Stain: NONE SEEN
Neutrophils Wright Stain: NONE SEEN
RBC Wright Stain: NONE SEEN

## 2010-07-28 LAB — GFR: EGFR: 36.4

## 2010-07-29 LAB — CBC AND DIFFERENTIAL
Basophils Absolute Automated: 0.03 10*3/uL (ref 0.00–0.20)
Basophils Automated: 0 % (ref 0–2)
Eosinophils Absolute Automated: 0.2 10*3/uL (ref 0.00–0.70)
Eosinophils Automated: 2 % (ref 0–5)
Hematocrit: 30.7 % — ABNORMAL LOW (ref 37.0–47.0)
Hgb: 9.8 g/dL — ABNORMAL LOW (ref 12.0–16.0)
Immature Granulocytes Absolute: 0.07 10*3/uL — ABNORMAL HIGH
Immature Granulocytes: 1 % (ref 0–1)
Lymphocytes Absolute Automated: 1.04 10*3/uL (ref 0.50–4.40)
Lymphocytes Automated: 8 % — ABNORMAL LOW (ref 15–41)
MCH: 29.7 pg (ref 28.0–32.0)
MCHC: 31.9 g/dL — ABNORMAL LOW (ref 32.0–36.0)
MCV: 93 fL (ref 80.0–100.0)
MPV: 11 fL (ref 9.4–12.3)
Monocytes Absolute Automated: 0.99 10*3/uL (ref 0.00–1.20)
Monocytes: 7 % (ref 0–11)
Neutrophils Absolute: 11.48 10*3/uL — ABNORMAL HIGH (ref 1.80–8.10)
Neutrophils: 84 % — ABNORMAL HIGH (ref 52–75)
Nucleated RBC: 0 /100 WBC
Platelets: 219 10*3/uL (ref 140–400)
RBC: 3.3 10*6/uL — ABNORMAL LOW (ref 4.20–5.40)
RDW: 18 % — ABNORMAL HIGH (ref 12–15)
WBC: 13.74 10*3/uL — ABNORMAL HIGH (ref 3.50–10.80)

## 2010-07-29 LAB — HEPATITIS B SURFACE ANTIGEN W/ REFLEX TO CONFIRMATION: Hepatitis B Surface Antigen: NONREACTIVE

## 2010-07-29 LAB — COMPREHENSIVE METABOLIC PANEL
ALT: 135 U/L — ABNORMAL HIGH (ref 21–72)
AST (SGOT): 133 U/L — ABNORMAL HIGH (ref 8–39)
Albumin/Globulin Ratio: 0.6 — ABNORMAL LOW (ref 1.1–1.8)
Albumin: 2.4 g/dL — ABNORMAL LOW (ref 3.7–5.1)
Alkaline Phosphatase: 277 U/L — ABNORMAL HIGH (ref 43–122)
BUN: 24 mg/dL — ABNORMAL HIGH (ref 7–21)
Bilirubin, Total: 0.9 mg/dL (ref 0.2–1.3)
CO2: 23 mEq/L (ref 22–31)
Calcium: 7.8 mg/dL — ABNORMAL LOW (ref 8.6–10.2)
Chloride: 107 mEq/L (ref 98–107)
Creatinine: 1.1 mg/dL (ref 0.5–1.4)
Globulin: 3.8 g/dL — ABNORMAL HIGH (ref 2.0–3.7)
Glucose: 115 mg/dL — ABNORMAL HIGH (ref 70–100)
Potassium: 3.7 mEq/L (ref 3.6–5.0)
Protein, Total: 6.2 g/dL (ref 6.0–8.0)
Sodium: 140 mEq/L (ref 136–143)

## 2010-07-29 LAB — HEPATITIS A ANTIBODY, IGM: Hep A IgM: NONREACTIVE

## 2010-07-29 LAB — GFR: EGFR: 56.1

## 2010-07-29 LAB — HEPATITIS B CORE ANTIBODY, IGM: Hepatitis B Core IgM: NONREACTIVE

## 2010-07-29 LAB — HEPATITIS C ANTIBODY: Hepatitis C, AB: NONREACTIVE

## 2010-07-30 LAB — CBC AND DIFFERENTIAL
Basophils Absolute Automated: 0.01 10*3/uL (ref 0.00–0.20)
Basophils Automated: 0 % (ref 0–2)
Eosinophils Absolute Automated: 0.12 10*3/uL (ref 0.00–0.70)
Eosinophils Automated: 1 % (ref 0–5)
Hematocrit: 30.8 % — ABNORMAL LOW (ref 37.0–47.0)
Hgb: 9.8 g/dL — ABNORMAL LOW (ref 12.0–16.0)
Immature Granulocytes Absolute: 0.04 10*3/uL
Immature Granulocytes: 0 % (ref 0–1)
Lymphocytes Absolute Automated: 1.24 10*3/uL (ref 0.50–4.40)
Lymphocytes Automated: 12 % — ABNORMAL LOW (ref 15–41)
MCH: 29.7 pg (ref 28.0–32.0)
MCHC: 31.8 g/dL — ABNORMAL LOW (ref 32.0–36.0)
MCV: 93.3 fL (ref 80.0–100.0)
MPV: 10.1 fL (ref 9.4–12.3)
Monocytes Absolute Automated: 0.67 10*3/uL (ref 0.00–1.20)
Monocytes: 6 % (ref 0–11)
Neutrophils Absolute: 8.49 10*3/uL — ABNORMAL HIGH (ref 1.80–8.10)
Neutrophils: 81 % — ABNORMAL HIGH (ref 52–75)
Nucleated RBC: 0 /100 WBC
Platelets: 207 10*3/uL (ref 140–400)
RBC: 3.3 10*6/uL — ABNORMAL LOW (ref 4.20–5.40)
RDW: 18 % — ABNORMAL HIGH (ref 12–15)
WBC: 10.53 10*3/uL (ref 3.50–10.80)

## 2010-07-30 LAB — COMPREHENSIVE METABOLIC PANEL
ALT: 107 U/L — ABNORMAL HIGH (ref 21–72)
AST (SGOT): 81 U/L — ABNORMAL HIGH (ref 8–39)
Albumin/Globulin Ratio: 0.7 — ABNORMAL LOW (ref 1.1–1.8)
Albumin: 2.5 g/dL — ABNORMAL LOW (ref 3.7–5.1)
Alkaline Phosphatase: 296 U/L — ABNORMAL HIGH (ref 43–122)
BUN: 16 mg/dL (ref 7–21)
Bilirubin, Total: 0.6 mg/dL (ref 0.2–1.3)
CO2: 22 mEq/L (ref 22–31)
Calcium: 7.9 mg/dL — ABNORMAL LOW (ref 8.6–10.2)
Chloride: 112 mEq/L — ABNORMAL HIGH (ref 98–107)
Creatinine: 0.9 mg/dL (ref 0.5–1.4)
Globulin: 3.8 g/dL — ABNORMAL HIGH (ref 2.0–3.7)
Glucose: 147 mg/dL — ABNORMAL HIGH (ref 70–100)
Potassium: 4.7 mEq/L (ref 3.6–5.0)
Protein, Total: 6.3 g/dL (ref 6.0–8.0)
Sodium: 141 mEq/L (ref 136–143)

## 2010-07-30 LAB — GFR: EGFR: >60

## 2010-07-30 LAB — LIPASE: Lipase: 212 U/L (ref 23–300)

## 2010-07-30 LAB — HEPATIC FUNCTION PANEL
Bilirubin Direct: 0.2 mg/dL (ref 0.0–0.3)
Bilirubin Indirect: 0.4 mg/dL (ref 0.0–1.1)

## 2010-07-30 LAB — HEMOGLOBIN A1C: Hemoglobin A1C: 8.9 % — ABNORMAL HIGH (ref 0.0–6.0)

## 2010-07-30 LAB — AMYLASE: Amylase: 69 U/L (ref 30–110)

## 2010-07-31 LAB — CBC AND DIFFERENTIAL
Basophils Absolute Automated: 0.02 10*3/uL (ref 0.00–0.20)
Basophils Automated: 0 % (ref 0–2)
Eosinophils Absolute Automated: 0.2 10*3/uL (ref 0.00–0.70)
Eosinophils Automated: 2 % (ref 0–5)
Hematocrit: 30.1 % — ABNORMAL LOW (ref 37.0–47.0)
Hgb: 9.6 g/dL — ABNORMAL LOW (ref 12.0–16.0)
Immature Granulocytes Absolute: 0.06 10*3/uL — ABNORMAL HIGH
Immature Granulocytes: 1 % (ref 0–1)
Lymphocytes Absolute Automated: 1.54 10*3/uL (ref 0.50–4.40)
Lymphocytes Automated: 14 % — ABNORMAL LOW (ref 15–41)
MCH: 29.7 pg (ref 28.0–32.0)
MCHC: 31.9 g/dL — ABNORMAL LOW (ref 32.0–36.0)
MCV: 93.2 fL (ref 80.0–100.0)
MPV: 11.1 fL (ref 9.4–12.3)
Monocytes Absolute Automated: 1 10*3/uL (ref 0.00–1.20)
Monocytes: 9 % (ref 0–11)
Neutrophils Absolute: 8.37 10*3/uL — ABNORMAL HIGH (ref 1.80–8.10)
Neutrophils: 75 % (ref 52–75)
Nucleated RBC: 0 /100 WBC
Platelets: 247 10*3/uL (ref 140–400)
RBC: 3.23 10*6/uL — ABNORMAL LOW (ref 4.20–5.40)
RDW: 18 % — ABNORMAL HIGH (ref 12–15)
WBC: 11.13 10*3/uL — ABNORMAL HIGH (ref 3.50–10.80)

## 2010-07-31 LAB — URINALYSIS WITH MICROSCOPIC
Bilirubin, UA: NEGATIVE
Blood, UA: NEGATIVE
Glucose, UA: NEGATIVE
Ketones UA: NEGATIVE
Leukocyte Esterase, UA: NEGATIVE
Nitrite, UA: NEGATIVE
Protein, UR: NEGATIVE
Specific Gravity UA POCT: 1.01 (ref 1.001–1.035)
Urine pH: 6 (ref 5.0–8.0)
Urobilinogen, UA: NORMAL mg/dL

## 2010-07-31 LAB — COMPREHENSIVE METABOLIC PANEL
ALT: 87 U/L — ABNORMAL HIGH (ref 21–72)
AST (SGOT): 66 U/L — ABNORMAL HIGH (ref 8–39)
Albumin/Globulin Ratio: 0.6 — ABNORMAL LOW (ref 1.1–1.8)
Albumin: 2.6 g/dL — ABNORMAL LOW (ref 3.7–5.1)
Alkaline Phosphatase: 291 U/L — ABNORMAL HIGH (ref 43–122)
BUN: 14 mg/dL (ref 7–21)
Bilirubin, Total: 0.5 mg/dL (ref 0.2–1.3)
CO2: 24 mEq/L (ref 22–31)
Calcium: 8.2 mg/dL — ABNORMAL LOW (ref 8.6–10.2)
Chloride: 110 mEq/L — ABNORMAL HIGH (ref 98–107)
Creatinine: 0.9 mg/dL (ref 0.5–1.4)
Globulin: 4.1 g/dL — ABNORMAL HIGH (ref 2.0–3.7)
Glucose: 104 mg/dL — ABNORMAL HIGH (ref 70–100)
Potassium: 4.8 mEq/L (ref 3.6–5.0)
Protein, Total: 6.7 g/dL (ref 6.0–8.0)
Sodium: 139 mEq/L (ref 136–143)

## 2010-07-31 LAB — HEPATIC FUNCTION PANEL
Bilirubin Direct: 0.2 mg/dL (ref 0.0–0.3)
Bilirubin Indirect: 0.4 mg/dL (ref 0.0–1.1)

## 2010-07-31 LAB — GFR: EGFR: >60

## 2010-07-31 LAB — URINE CULTURE

## 2010-08-01 LAB — CBC AND DIFFERENTIAL
Basophils Absolute Automated: 0.02 10*3/uL (ref 0.00–0.20)
Basophils Automated: 0 % (ref 0–2)
Eosinophils Absolute Automated: 0.16 10*3/uL (ref 0.00–0.70)
Eosinophils Automated: 1 % (ref 0–5)
Hematocrit: 30.6 % — ABNORMAL LOW (ref 37.0–47.0)
Hgb: 9.7 g/dL — ABNORMAL LOW (ref 12.0–16.0)
Immature Granulocytes Absolute: 0.06 10*3/uL — ABNORMAL HIGH
Immature Granulocytes: 1 % (ref 0–1)
Lymphocytes Absolute Automated: 1.74 10*3/uL (ref 0.50–4.40)
Lymphocytes Automated: 15 % (ref 15–41)
MCH: 29.5 pg (ref 28.0–32.0)
MCHC: 31.7 g/dL — ABNORMAL LOW (ref 32.0–36.0)
MCV: 93 fL (ref 80.0–100.0)
MPV: 10.9 fL (ref 9.4–12.3)
Monocytes Absolute Automated: 1.24 10*3/uL — ABNORMAL HIGH (ref 0.00–1.20)
Monocytes: 11 % (ref 0–11)
Neutrophils Absolute: 8.61 10*3/uL — ABNORMAL HIGH (ref 1.80–8.10)
Neutrophils: 73 % (ref 52–75)
Nucleated RBC: 0 /100 WBC
Platelets: 238 10*3/uL (ref 140–400)
RBC: 3.29 10*6/uL — ABNORMAL LOW (ref 4.20–5.40)
RDW: 18 % — ABNORMAL HIGH (ref 12–15)
WBC: 11.77 10*3/uL — ABNORMAL HIGH (ref 3.50–10.80)

## 2010-08-01 LAB — COMPREHENSIVE METABOLIC PANEL
ALT: 66 U/L (ref 21–72)
AST (SGOT): 48 U/L — ABNORMAL HIGH (ref 8–39)
Albumin/Globulin Ratio: 0.7 — ABNORMAL LOW (ref 1.1–1.8)
Albumin: 2.7 g/dL — ABNORMAL LOW (ref 3.7–5.1)
Alkaline Phosphatase: 276 U/L — ABNORMAL HIGH (ref 43–122)
BUN: 19 mg/dL (ref 7–21)
Bilirubin, Total: 0.9 mg/dL (ref 0.2–1.3)
CO2: 25 mEq/L (ref 22–31)
Calcium: 8.3 mg/dL — ABNORMAL LOW (ref 8.6–10.2)
Chloride: 105 mEq/L (ref 98–107)
Creatinine: 0.8 mg/dL (ref 0.5–1.4)
Globulin: 4 g/dL — ABNORMAL HIGH (ref 2.0–3.7)
Glucose: 72 mg/dL (ref 70–100)
Potassium: 4.6 mEq/L (ref 3.6–5.0)
Protein, Total: 6.7 g/dL (ref 6.0–8.0)
Sodium: 138 mEq/L (ref 136–143)

## 2010-08-01 LAB — GFR: EGFR: >60

## 2010-11-07 ENCOUNTER — Ambulatory Visit
Admit: 2010-11-07 | Discharge: 2010-11-07 | Payer: Self-pay | Source: Ambulatory Visit | Attending: Emergency Medicine | Admitting: Emergency Medicine

## 2010-11-14 ENCOUNTER — Inpatient Hospital Stay
Admission: EM | Admit: 2010-11-14 | Disposition: A | Discharge status: Home / Self Care | Payer: Self-pay | Source: Emergency Department | Attending: Internal Medicine | Admitting: Internal Medicine

## 2010-11-14 LAB — COMPREHENSIVE METABOLIC PANEL
ALT: 27 U/L (ref 21–72)
AST (SGOT): 49 U/L — ABNORMAL HIGH (ref 8–39)
Albumin/Globulin Ratio: 0.8 — ABNORMAL LOW (ref 1.1–1.8)
Albumin: 3.5 g/dL — ABNORMAL LOW (ref 3.7–5.1)
Alkaline Phosphatase: 236 U/L — ABNORMAL HIGH (ref 43–122)
BUN: 38 mg/dL — ABNORMAL HIGH (ref 7–21)
Bilirubin, Total: 0.5 mg/dL (ref 0.2–1.3)
CO2: 20 mEq/L — ABNORMAL LOW (ref 22–31)
Calcium: 8.8 mg/dL (ref 8.6–10.2)
Chloride: 104 mEq/L (ref 98–107)
Creatinine: 2.4 mg/dL — ABNORMAL HIGH (ref 0.5–1.4)
Globulin: 4.3 g/dL — ABNORMAL HIGH (ref 2.0–3.7)
Glucose: 199 mg/dL — ABNORMAL HIGH (ref 70–100)
Potassium: 5.3 mEq/L — ABNORMAL HIGH (ref 3.6–5.0)
Protein, Total: 7.8 g/dL (ref 6.0–8.0)
Sodium: 139 mEq/L (ref 136–143)

## 2010-11-14 LAB — CBC AND DIFFERENTIAL
Basophils Absolute Automated: 0.03 10*3/uL (ref 0.00–0.20)
Basophils Automated: 0 % (ref 0–2)
Eosinophils Absolute Automated: 0.27 10*3/uL (ref 0.00–0.70)
Eosinophils Automated: 3 % (ref 0–5)
Hematocrit: 30.8 % — ABNORMAL LOW (ref 37.0–47.0)
Hgb: 9.8 g/dL — ABNORMAL LOW (ref 12.0–16.0)
Immature Granulocytes Absolute: 0.02 10*3/uL
Immature Granulocytes: 0 % (ref 0–1)
Lymphocytes Absolute Automated: 1.82 10*3/uL (ref 0.50–4.40)
Lymphocytes Automated: 23 % (ref 15–41)
MCH: 29.5 pg (ref 28.0–32.0)
MCHC: 31.8 g/dL — ABNORMAL LOW (ref 32.0–36.0)
MCV: 92.8 fL (ref 80.0–100.0)
MPV: 11.2 fL (ref 9.4–12.3)
Monocytes Absolute Automated: 0.49 10*3/uL (ref 0.00–1.20)
Monocytes: 6 % (ref 0–11)
Neutrophils Absolute: 5.31 10*3/uL (ref 1.80–8.10)
Neutrophils: 67 % (ref 52–75)
Nucleated RBC: 0 /100 WBC
Platelets: 214 10*3/uL (ref 140–400)
RBC: 3.32 10*6/uL — ABNORMAL LOW (ref 4.20–5.40)
RDW: 17 % — ABNORMAL HIGH (ref 12–15)
WBC: 7.92 10*3/uL (ref 3.50–10.80)

## 2010-11-14 LAB — GFR: EGFR: 22.8

## 2010-11-14 LAB — PT AND APTT
PT INR: 1.2 — ABNORMAL HIGH (ref 0.9–1.1)
PT: 14.8 s (ref 12.6–15.0)
PTT: 36 s (ref 23–37)

## 2010-11-15 LAB — BASIC METABOLIC PANEL
BUN: 32 mg/dL — ABNORMAL HIGH (ref 7–21)
CO2: 22 mEq/L (ref 22–31)
Calcium: 8.5 mg/dL — ABNORMAL LOW (ref 8.6–10.2)
Chloride: 107 mEq/L (ref 98–107)
Creatinine: 1.5 mg/dL — ABNORMAL HIGH (ref 0.5–1.4)
Glucose: 286 mg/dL — ABNORMAL HIGH (ref 70–100)
Potassium: 5.1 mEq/L — ABNORMAL HIGH (ref 3.6–5.0)
Sodium: 142 mEq/L (ref 136–143)

## 2010-11-15 LAB — GFR: EGFR: 39.2

## 2010-11-15 LAB — CBC AND DIFFERENTIAL
Basophils Absolute Automated: 0 10*3/uL (ref 0.00–0.20)
Basophils Automated: 0 % (ref 0–2)
Eosinophils Absolute Automated: 0.01 10*3/uL (ref 0.00–0.70)
Eosinophils Automated: 0 % (ref 0–5)
Hematocrit: 30.6 % — ABNORMAL LOW (ref 37.0–47.0)
Hgb: 9.7 g/dL — ABNORMAL LOW (ref 12.0–16.0)
Immature Granulocytes Absolute: 0.01 10*3/uL
Immature Granulocytes: 0 % (ref 0–1)
Lymphocytes Absolute Automated: 0.73 10*3/uL (ref 0.50–4.40)
Lymphocytes Automated: 11 % — ABNORMAL LOW (ref 15–41)
MCH: 29 pg (ref 28.0–32.0)
MCHC: 31.7 g/dL — ABNORMAL LOW (ref 32.0–36.0)
MCV: 91.6 fL (ref 80.0–100.0)
MPV: 11.9 fL (ref 9.4–12.3)
Monocytes Absolute Automated: 0.02 10*3/uL (ref 0.00–1.20)
Monocytes: 0 % (ref 0–11)
Neutrophils Absolute: 5.83 10*3/uL (ref 1.80–8.10)
Neutrophils: 88 % — ABNORMAL HIGH (ref 52–75)
Nucleated RBC: 0 /100 WBC
Platelets: 242 10*3/uL (ref 140–400)
RBC: 3.34 10*6/uL — ABNORMAL LOW (ref 4.20–5.40)
RDW: 17 % — ABNORMAL HIGH (ref 12–15)
WBC: 6.59 10*3/uL (ref 3.50–10.80)

## 2010-11-15 LAB — IRON PROFILE
Iron Saturation: 12 % — ABNORMAL LOW (ref 15–50)
Iron: 27 ug/dL — ABNORMAL LOW (ref 40–145)
TIBC: 216 ug/dL — ABNORMAL LOW (ref 265–497)
UIBC: 189 ug/dL (ref 126–382)

## 2010-11-15 LAB — PTH, INTACT: PTH Intact: 64.8 pg/mL (ref 9.0–72.0)

## 2010-11-15 LAB — FERRITIN: Ferritin: 234.46 ng/mL — ABNORMAL HIGH (ref 4.60–204.00)

## 2010-11-15 LAB — HEMOLYSIS INDEX: Hemolysis Index: 10 Index — ABNORMAL HIGH (ref 0–9)

## 2010-11-16 LAB — CBC
Hematocrit: 29.3 % — ABNORMAL LOW (ref 37.0–47.0)
Hgb: 9.4 g/dL — ABNORMAL LOW (ref 12.0–16.0)
MCH: 29.1 pg (ref 28.0–32.0)
MCHC: 32.1 g/dL (ref 32.0–36.0)
MCV: 90.7 fL (ref 80.0–100.0)
MPV: 11 fL (ref 9.4–12.3)
Nucleated RBC: 0 /100 WBC
Platelets: 233 10*3/uL (ref 140–400)
RBC: 3.23 10*6/uL — ABNORMAL LOW (ref 4.20–5.40)
RDW: 16 % — ABNORMAL HIGH (ref 12–15)
WBC: 9.19 10*3/uL (ref 3.50–10.80)

## 2010-11-16 LAB — RENAL FUNCTION PANEL
Albumin: 3.3 g/dL — ABNORMAL LOW (ref 3.7–5.1)
BUN: 22 mg/dL — ABNORMAL HIGH (ref 7–21)
CO2: 26 mEq/L (ref 22–31)
Calcium: 9.2 mg/dL (ref 8.6–10.2)
Chloride: 110 mEq/L — ABNORMAL HIGH (ref 98–107)
Creatinine: 0.9 mg/dL (ref 0.5–1.4)
Glucose: 107 mg/dL — ABNORMAL HIGH (ref 70–100)
Phosphorus: 3.1 mg/dL (ref 2.5–4.5)
Potassium: 4.3 mEq/L (ref 3.6–5.0)
Sodium: 147 mEq/L — ABNORMAL HIGH (ref 136–143)

## 2010-11-16 LAB — HEMOGLOBIN A1C: Hemoglobin A1C: 9.4 % — ABNORMAL HIGH (ref 0.0–6.0)

## 2010-11-16 LAB — GFR: EGFR: >60

## 2010-11-17 LAB — HEPATIC FUNCTION PANEL
ALT: 26 U/L (ref 21–72)
AST (SGOT): 44 U/L — ABNORMAL HIGH (ref 8–39)
Albumin/Globulin Ratio: 0.7 — ABNORMAL LOW (ref 1.1–1.8)
Alkaline Phosphatase: 236 U/L — ABNORMAL HIGH (ref 43–122)
Bilirubin Direct: 0.3 mg/dL (ref 0.0–0.3)
Bilirubin Indirect: 0 mg/dL (ref 0.0–1.1)
Bilirubin, Total: 0.3 mg/dL (ref 0.2–1.3)
Globulin: 4.4 g/dL — ABNORMAL HIGH (ref 2.0–3.7)
Protein, Total: 7.5 g/dL (ref 6.0–8.0)

## 2010-11-17 LAB — RENAL FUNCTION PANEL
Albumin: 3.1 g/dL — ABNORMAL LOW (ref 3.7–5.1)
BUN: 21 mg/dL (ref 7–21)
CO2: 25 mEq/L (ref 22–31)
Calcium: 8.9 mg/dL (ref 8.6–10.2)
Chloride: 107 mEq/L (ref 98–107)
Creatinine: 0.8 mg/dL (ref 0.5–1.4)
Glucose: 169 mg/dL — ABNORMAL HIGH (ref 70–100)
Phosphorus: 2.8 mg/dL (ref 2.5–4.5)
Potassium: 4.1 mEq/L (ref 3.6–5.0)
Sodium: 143 mEq/L (ref 136–143)

## 2010-11-17 LAB — C4 COMPLEMENT: C4 Complement: 27.5 mg/dL (ref 15.0–57.0)

## 2010-11-17 LAB — C3 COMPLEMENT: C3 Complement: 132 mg/dL (ref 83–193)

## 2010-11-17 LAB — GFR: EGFR: >60

## 2010-11-18 LAB — RENAL FUNCTION PANEL
Albumin: 2.7 g/dL — ABNORMAL LOW (ref 3.7–5.1)
BUN: 19 mg/dL (ref 7–21)
CO2: 27 mEq/L (ref 22–31)
Calcium: 8.4 mg/dL — ABNORMAL LOW (ref 8.6–10.2)
Chloride: 108 mEq/L — ABNORMAL HIGH (ref 98–107)
Creatinine: 0.8 mg/dL (ref 0.5–1.4)
Glucose: 108 mg/dL — ABNORMAL HIGH (ref 70–100)
Phosphorus: 3.3 mg/dL (ref 2.5–4.5)
Potassium: 4 mEq/L (ref 3.6–5.0)
Sodium: 143 mEq/L (ref 136–143)

## 2010-11-18 LAB — GFR: EGFR: >60

## 2010-11-19 LAB — RENAL FUNCTION PANEL
Albumin: 2.5 g/dL — ABNORMAL LOW (ref 3.7–5.1)
BUN: 17 mg/dL (ref 7–21)
CO2: 27 mEq/L (ref 22–31)
Calcium: 8.4 mg/dL — ABNORMAL LOW (ref 8.6–10.2)
Chloride: 108 mEq/L — ABNORMAL HIGH (ref 98–107)
Creatinine: 0.8 mg/dL (ref 0.5–1.4)
Glucose: 62 mg/dL — ABNORMAL LOW (ref 70–100)
Phosphorus: 3.6 mg/dL (ref 2.5–4.5)
Potassium: 3.7 mEq/L (ref 3.6–5.0)
Sodium: 143 mEq/L (ref 136–143)

## 2010-11-19 LAB — TSH: TSH: 2.15 u[IU]/mL (ref 0.465–4.680)

## 2010-11-19 LAB — GFR: EGFR: >60

## 2010-11-19 LAB — TROPONIN I
Troponin I: <0.01 ng/mL (ref 0.00–0.03)
Troponin I: <0.01 ng/mL (ref 0.00–0.03)
Troponin I: <0.01 ng/mL (ref 0.00–0.03)

## 2010-11-19 LAB — CK
Creatine Kinase (CK): 27 U/L (ref 20–140)
Creatine Kinase (CK): 29 U/L (ref 20–140)
Creatine Kinase (CK): 31 U/L (ref 20–140)

## 2010-11-20 LAB — RENAL FUNCTION PANEL
Albumin: 2.6 g/dL — ABNORMAL LOW (ref 3.7–5.1)
BUN: 16 mg/dL (ref 7–21)
CO2: 25 mEq/L (ref 22–31)
Calcium: 8.5 mg/dL — ABNORMAL LOW (ref 8.6–10.2)
Chloride: 108 mEq/L — ABNORMAL HIGH (ref 98–107)
Creatinine: 0.8 mg/dL (ref 0.5–1.4)
Glucose: 140 mg/dL — ABNORMAL HIGH (ref 70–100)
Phosphorus: 3.5 mg/dL (ref 2.5–4.5)
Potassium: 3.8 mEq/L (ref 3.6–5.0)
Sodium: 143 mEq/L (ref 136–143)

## 2010-11-20 LAB — GFR: EGFR: >60

## 2010-11-21 LAB — C1 ESTERASE INHIBITOR PROTEIN: C1 Inhibitor, Protein: 17

## 2010-12-26 ENCOUNTER — Emergency Department
Admit: 2010-12-26 | Disposition: A | Discharge status: Home / Self Care | Payer: Self-pay | Source: Emergency Department | Admitting: Emergency Medicine

## 2011-01-12 LAB — ECG 12-LEAD
Atrial Rate: 64 {beats}/min
Atrial Rate: 75 {beats}/min
P Axis: 39 degrees
P Axis: 46 degrees
P-R Interval: 160 ms
P-R Interval: 176 ms
Q-T Interval: 408 ms
Q-T Interval: 420 ms
QRS Duration: 90 ms
QRS Duration: 90 ms
QTC Calculation (Bezet): 433 ms
QTC Calculation (Bezet): 455 ms
R Axis: -16 degrees
R Axis: -9 degrees
T Axis: 11 degrees
T Axis: 3 degrees
Ventricular Rate: 64 {beats}/min
Ventricular Rate: 75 {beats}/min

## 2011-01-14 LAB — ECG 12-LEAD
Atrial Rate: 76 {beats}/min
Atrial Rate: 56 {beats}/min
Atrial Rate: 64 {beats}/min
Atrial Rate: 81 {beats}/min
P Axis: 54 degrees
P Axis: 15 degrees
P Axis: 51 degrees
P Axis: 8 degrees
P-R Interval: 156 ms
P-R Interval: 140 ms
P-R Interval: 142 ms
P-R Interval: 154 ms
Q-T Interval: 348 ms
Q-T Interval: 356 ms
Q-T Interval: 416 ms
Q-T Interval: 432 ms
QRS Duration: 76 ms
QRS Duration: 84 ms
QRS Duration: 86 ms
QRS Duration: 88 ms
QTC Calculation (Bezet): 391 ms
QTC Calculation (Bezet): 401 ms
QTC Calculation (Bezet): 413 ms
QTC Calculation (Bezet): 445 ms
R Axis: -20 degrees
R Axis: -12 degrees
R Axis: -13 degrees
R Axis: -23 degrees
T Axis: 31 degrees
T Axis: 19 degrees
T Axis: 2 degrees
T Axis: 30 degrees
Ventricular Rate: 76 {beats}/min
Ventricular Rate: 56 {beats}/min
Ventricular Rate: 64 {beats}/min
Ventricular Rate: 81 {beats}/min

## 2011-01-15 ENCOUNTER — Ambulatory Visit
Admit: 2011-01-15 | Discharge: 2011-01-15 | Disposition: A | Discharge status: Home / Self Care | Payer: Self-pay | Source: Ambulatory Visit

## 2011-01-17 LAB — ECG 12-LEAD
Atrial Rate: 50 {beats}/min
P Axis: 43 degrees
P-R Interval: 160 ms
Q-T Interval: 514 ms
QRS Duration: 102 ms
QTC Calculation (Bezet): 468 ms
R Axis: -15 degrees
T Axis: -14 degrees
Ventricular Rate: 50 {beats}/min

## 2011-01-18 LAB — ECG 12-LEAD
Atrial Rate: 74 {beats}/min
P Axis: 41 degrees
P-R Interval: 154 ms
Q-T Interval: 374 ms
QRS Duration: 88 ms
QTC Calculation (Bezet): 415 ms
R Axis: -20 degrees
T Axis: 6 degrees
Ventricular Rate: 74 {beats}/min

## 2011-01-20 ENCOUNTER — Emergency Department
Admit: 2011-01-20 | Disposition: A | Discharge status: Home / Self Care | Payer: Self-pay | Source: Emergency Department | Admitting: Emergency Medicine

## 2011-01-20 LAB — URINALYSIS, REFLEX TO MICROSCOPIC EXAM IF INDICATED
Bilirubin, UA: NEGATIVE
Blood, UA: NEGATIVE
Glucose, UA: NEGATIVE
Ketones UA: NEGATIVE
Leukocyte Esterase, UA: NEGATIVE
Nitrite, UA: NEGATIVE
Protein, UR: 30 — AB
Specific Gravity UA POCT: 1.015 (ref 1.001–1.035)
Urine pH: 6 (ref 5.0–8.0)
Urobilinogen, UA: NORMAL mg/dL

## 2011-01-20 LAB — COMPREHENSIVE METABOLIC PANEL
ALT: 28 U/L (ref 21–72)
AST (SGOT): 33 U/L (ref 8–39)
Albumin/Globulin Ratio: 0.8 — ABNORMAL LOW (ref 1.1–1.8)
Albumin: 3.3 g/dL — ABNORMAL LOW (ref 3.7–5.1)
Alkaline Phosphatase: 144 U/L — ABNORMAL HIGH (ref 43–122)
BUN: 24 mg/dL — ABNORMAL HIGH (ref 7–21)
Bilirubin, Total: 0.3 mg/dL (ref 0.2–1.3)
CO2: 26 mEq/L (ref 22–31)
Calcium: 8.5 mg/dL — ABNORMAL LOW (ref 8.6–10.2)
Chloride: 105 mEq/L (ref 98–107)
Creatinine: 1 mg/dL (ref 0.5–1.4)
Globulin: 4 g/dL — ABNORMAL HIGH (ref 2.0–3.7)
Glucose: 123 mg/dL — ABNORMAL HIGH (ref 70–100)
Potassium: 4.2 mEq/L (ref 3.6–5.0)
Protein, Total: 7.3 g/dL (ref 6.0–8.0)
Sodium: 139 mEq/L (ref 136–143)

## 2011-01-20 LAB — GFR: EGFR: >60

## 2011-01-20 LAB — CBC AND DIFFERENTIAL
Basophils Absolute Automated: 0.03 10*3/uL (ref 0.00–0.20)
Basophils Automated: 0 % (ref 0–2)
Eosinophils Absolute Automated: 0.28 10*3/uL (ref 0.00–0.70)
Eosinophils Automated: 4 % (ref 0–5)
Hematocrit: 31.5 % — ABNORMAL LOW (ref 37.0–47.0)
Hgb: 9.8 g/dL — ABNORMAL LOW (ref 12.0–16.0)
Immature Granulocytes Absolute: 0.03 10*3/uL
Immature Granulocytes: 0 % (ref 0–1)
Lymphocytes Absolute Automated: 1.86 10*3/uL (ref 0.50–4.40)
Lymphocytes Automated: 24 % (ref 15–41)
MCH: 30.7 pg (ref 28.0–32.0)
MCHC: 31.1 g/dL — ABNORMAL LOW (ref 32.0–36.0)
MCV: 98.7 fL (ref 80.0–100.0)
MPV: 10.8 fL (ref 9.4–12.3)
Monocytes Absolute Automated: 0.98 10*3/uL (ref 0.00–1.20)
Monocytes: 13 % — ABNORMAL HIGH (ref 0–11)
Neutrophils Absolute: 4.44 10*3/uL (ref 1.80–8.10)
Neutrophils: 59 % (ref 52–75)
Nucleated RBC: 0 /100 WBC
Platelets: 227 10*3/uL (ref 140–400)
RBC: 3.19 10*6/uL — ABNORMAL LOW (ref 4.20–5.40)
RDW: 14 % (ref 12–15)
WBC: 7.59 10*3/uL (ref 3.50–10.80)

## 2011-01-21 LAB — ECG 12-LEAD
Atrial Rate: 52 {beats}/min
Atrial Rate: 73 {beats}/min
P Axis: 33 degrees
P Axis: 49 degrees
P-R Interval: 156 ms
P-R Interval: 166 ms
Q-T Interval: 396 ms
Q-T Interval: 458 ms
QRS Duration: 90 ms
QRS Duration: 92 ms
QTC Calculation (Bezet): 425 ms
QTC Calculation (Bezet): 436 ms
R Axis: -17 degrees
R Axis: -18 degrees
T Axis: 3 degrees
T Axis: 7 degrees
Ventricular Rate: 52 {beats}/min
Ventricular Rate: 73 {beats}/min

## 2011-01-22 LAB — ECG 12-LEAD
Atrial Rate: 63 {beats}/min
P Axis: 40 degrees
P-R Interval: 160 ms
Q-T Interval: 396 ms
QRS Duration: 90 ms
QTC Calculation (Bezet): 405 ms
R Axis: -18 degrees
T Axis: 5 degrees
Ventricular Rate: 63 {beats}/min

## 2011-01-25 LAB — ECG 12-LEAD
Atrial Rate: 63 {beats}/min
Atrial Rate: 78 {beats}/min
Atrial Rate: 63 {beats}/min
Atrial Rate: 70 {beats}/min
Atrial Rate: 83 {beats}/min
P Axis: 35 degrees
P Axis: 43 degrees
P Axis: 38 degrees
P Axis: 45 degrees
P Axis: 45 degrees
P-R Interval: 150 ms
P-R Interval: 158 ms
P-R Interval: 154 ms
P-R Interval: 158 ms
P-R Interval: 158 ms
Q-T Interval: 364 ms
Q-T Interval: 444 ms
Q-T Interval: 384 ms
Q-T Interval: 416 ms
Q-T Interval: 452 ms
QRS Duration: 86 ms
QRS Duration: 96 ms
QRS Duration: 88 ms
QRS Duration: 90 ms
QRS Duration: 90 ms
QTC Calculation (Bezet): 414 ms
QTC Calculation (Bezet): 454 ms
QTC Calculation (Bezet): 449 ms
QTC Calculation (Bezet): 451 ms
QTC Calculation (Bezet): 462 ms
R Axis: -17 degrees
R Axis: -26 degrees
R Axis: -13 degrees
R Axis: -13 degrees
R Axis: -31 degrees
T Axis: 15 degrees
T Axis: 48 degrees
T Axis: 11 degrees
T Axis: 14 degrees
T Axis: 20 degrees
Ventricular Rate: 63 {beats}/min
Ventricular Rate: 78 {beats}/min
Ventricular Rate: 63 {beats}/min
Ventricular Rate: 70 {beats}/min
Ventricular Rate: 83 {beats}/min

## 2011-02-06 LAB — ECG 12-LEAD
Atrial Rate: 90 {beats}/min
P Axis: 39 degrees
P-R Interval: 148 ms
Q-T Interval: 374 ms
QRS Duration: 82 ms
QTC Calculation (Bezet): 457 ms
R Axis: -18 degrees
T Axis: 23 degrees
Ventricular Rate: 90 {beats}/min

## 2011-02-07 LAB — ECG 12-LEAD
Atrial Rate: 49 {beats}/min
Atrial Rate: 63 {beats}/min
Atrial Rate: 80 {beats}/min
Atrial Rate: 64 {beats}/min
P Axis: 17 degrees
P Axis: 42 degrees
P Axis: 44 degrees
P Axis: 50 degrees
P-R Interval: 146 ms
P-R Interval: 154 ms
P-R Interval: 164 ms
P-R Interval: 168 ms
Q-T Interval: 372 ms
Q-T Interval: 424 ms
Q-T Interval: 430 ms
Q-T Interval: 408 ms
QRS Duration: 90 ms
QRS Duration: 92 ms
QRS Duration: 92 ms
QRS Duration: 94 ms
QTC Calculation (Bezet): 388 ms
QTC Calculation (Bezet): 429 ms
QTC Calculation (Bezet): 433 ms
QTC Calculation (Bezet): 420 ms
R Axis: -13 degrees
R Axis: -19 degrees
R Axis: -28 degrees
R Axis: -19 degrees
T Axis: -8 degrees
T Axis: 2 degrees
T Axis: 9 degrees
T Axis: -2 degrees
Ventricular Rate: 49 {beats}/min
Ventricular Rate: 63 {beats}/min
Ventricular Rate: 80 {beats}/min
Ventricular Rate: 64 {beats}/min

## 2011-02-08 LAB — ECG 12-LEAD
Atrial Rate: 101 {beats}/min
Atrial Rate: 73 {beats}/min
P Axis: 36 degrees
P Axis: 38 degrees
P-R Interval: 144 ms
P-R Interval: 150 ms
Q-T Interval: 354 ms
Q-T Interval: 386 ms
QRS Duration: 84 ms
QRS Duration: 86 ms
QTC Calculation (Bezet): 459 ms
QTC Calculation (Bezet): 425 ms
R Axis: -19 degrees
R Axis: -21 degrees
T Axis: 20 degrees
T Axis: 21 degrees
Ventricular Rate: 101 {beats}/min
Ventricular Rate: 73 {beats}/min

## 2011-02-10 LAB — ECG 12-LEAD
Atrial Rate: 84 {beats}/min
P Axis: 59 degrees
P-R Interval: 150 ms
Q-T Interval: 360 ms
QRS Duration: 108 ms
QTC Calculation (Bezet): 425 ms
R Axis: -19 degrees
T Axis: 32 degrees
Ventricular Rate: 84 {beats}/min

## 2011-02-12 LAB — ECG 12-LEAD
Atrial Rate: 83 {beats}/min
P Axis: 58 degrees
P-R Interval: 148 ms
Q-T Interval: 350 ms
QRS Duration: 84 ms
QTC Calculation (Bezet): 411 ms
R Axis: -4 degrees
T Axis: 39 degrees
Ventricular Rate: 83 {beats}/min

## 2011-02-15 NOTE — Op Note (Unsigned)
SURGEON:             Vito Backers, MD      ADMITTED:            02/03/2007      PROCEDURE DATE: 02/06/2007      ROOM NUMBER:         3B  327 02            PROCEDURES:      1.   ERCP.      2.   _____ balloon sweep.            PREOPERATIVE DIAGNOSIS:  Biliary pancreatitis, status post laparoscopic      cholecystectomy and also abnormal LFTs, indicating possible biliary      obstruction.            POSTOPERATIVE DIAGNOSIS:  Dilated CBD and large amount of slush that was      seen in the common bile duct.            MEDICATIONS:  Administered by the department of anesthesia.            DESCRIPTION OF PROCEDURE:  The patient was seen in the holding area. The      procedure has been discussed with her in detail, including its potential      risk and complications of pancreatitis, infection with cholangitis and      bleeding and perforation. Patient signed the informed consent and was taken      to the operating room.            After IV sedation was given, patient was in the left lateral position, the      Olympus side-view scope was introduced blindly, all the way down to the      second portion of the duodenum. The major papilla appears swollen,      indicating there is some filling defect. With this a tapered catheter was      used. A small amount of dye was injected that revealed pancreatic duct, and      then the catheter was redirected and dye was injected, and that revealed      common bile duct, which was tortuous, enlarged. A guidewire was put in, and      then the tapered catheter was removed. A Stone-tome inserted over the      guidewire and passed all the way through, down to the bile duct. Then a      sphincterotomy was done in proper fashion. Then balloon sweep was done      several times, and that revealed a large amount of sludge that all came      out. The injection of the dye with the balloon compression of the common      bile duct revealed no filling defect noted. At this time the catheter and       the balloon were all removed.            Patient tolerated the procedure well, and was taken to the recovery room in      stable condition. I will discuss those results with her family. We need to      monitor her LFTs. Continue IV antibiotics. I put her on Levaquin for today      and tomorrow. If LFTs should be improved, and if it is still high, we may      have to do a follow up ERCP with a  second ERCP, but will follow      clinically.            Thank you very much, Dr. Derrill Center, for asking me to follow patient.                                    ___________________________________     Date Signed: _______________      Vito Backers, MD                  D 02/06/2007  5:24 P; T 02/07/2007 12:50 P; 1610 - - , [id number], #[id number]      CC:  Vito Backers, MD           Ike Bene, MD

## 2011-02-15 NOTE — H&P (Unsigned)
ATTENDING MD:  Alease Medina, MD      ADMITTED:      04/03/2007      ROOM:          3B  327 01            REASON FOR ADMISSION:  Acute angioedema, abdominal pain, known history of      rheumatoid disease status post recent resection, gangrenous gallbladder.            BRIEF HISTORY:  The patient is an 77 year old female, well-known to the      practice, with a history of rheumatoid disease, well-controlled on a      low-dose of oral steroids, methotrexate therapy, with diabetes mellitus.      The patient presents with complaint of vomiting, diarrhea, diffuse      abdominal discomfort, for 4 days prior to arrival, with pain most severe,      previously noted, in the left upper quadrant.  Patient had several episodes      of witnessed emesis in the emergency department.  Of note, patient had been      seen on the day of her admission by Dr. Harle Stanford in the office.  She      developed acute facial swelling, particularly lip swelling, and raised the      concern of possible medication reaction.  The patient was treated in the      emergency department with methylprednisolone and parenteral Zofran with      good relief.  The patient had been seen recently by Dr. Halina Andreas for      abdominal pain and sepsis.  Was treated as an inpatient at the [facility] and ultimately underwent laparoscopic cholecystectomy by Dr.      Derrill Center.  The patient recovered, went to a nursing facility for several      weeks for additional care, and ultimately returned home.            PHYSICAL EXAMINATION:  The patient was examined shortly after arrival on      the medical floor.  Patient was noted to be alert, reactive.  Blood      pressure 160/78, pulse 78, temperature 98.8, O2 saturation 98%. Good      historian, fully alert, oriented.  Bitemporal wasting noted.  Lung fields      grossly clear with decreased breath sounds at bilateral bases.  Heart      sounds distant with regular rhythm.  Abdomen:  Increased  tenderness in the      left upper quadrant.  Mild diffuse crampiness.  Negative psoas sign.  State      of hydration appears good.            DIAGNOSTIC DATA:  Patient underwent a limited oral contrast study CT scan      of the abdomen and pelvis demonstrate diverticulosis but no evidence of      acute diverticulitis.  No ascites or fluid collection is noted.  Small left      pleural effusion is noted on chest x-ray.            ASSESSMENT AND PLAN:  Patient will be admitted to the hospital because of      an elevated alkaline phosphatase and the possibility of recurrent sepsis as      noted in the past.  Patient will be seen in consultation by Dr. Derrill Center.  Patient will have blood cultures obtained following which parenteral      steroid support will be reinitiated.  Other diagnostic studies, in addition      to the above-noted CT scan will be ordered as indicated.  Bed is on 3-B.                                    ___________________________________     Date Signed: _______________      Alease Medina, MD                  D 04/04/2007  1:38 P; T 04/04/2007  3:26 P; 9750 - -  , [id number], #[id number]      cc:    Alease Medina, MD

## 2011-02-15 NOTE — Consults (Signed)
ATTENDING MD:      Tera Partridge MD: Ronelle Nigh, MD      ADMITTED:      04/03/2007      CONSULTED:     04/07/2007      ROOM:          3B  327 01            REASON FOR CONSULTATION:  Cough and left-sided chest pain.            HISTORY OF PRESENT ILLNESS:  This is an 77 year old female who was admitted      to the hospital on 04/03/2007 with complaint of abdominal pain, nausea and      vomiting.  Patient also had angioedema which apparently was secondary to 1      of her medications. The patient was treated with the medication      prednisolone and Zofran with good relief.  Currently patient has no      complaints of abdominal pain, nausea or vomiting, but she is complaining of      cough, which is productive of clear sputum.  She also complains of      pleuritic type of pain in the left anterior chest.  Patient states that she      noticed cough and chest pain a couple of days ago.  She denies any fevers      or chills.  She does state that she has ongoing postnasal drip.  She denies      any gastroesophageal reflux disease symptoms.  She denies any wheezing or      shortness of breath.  Patient had recent V/Q scan which was low probability      for PE.  She also had a chest x-ray with decubitus views which showed      minimal pleural effusions bilaterally.  No infiltrates.  She also had a      Doppler of lower extremity that was negative for DVT.            REVIEW OF SYSTEMS:  Review of systems was done completely and was negative      except for the above mentioned.            PAST MEDICAL HISTORY:  Significant for rheumatoid arthritis, hypertension,      diabetes, coronary artery disease.            ALLERGIES:  Iodine, erythromycin, hydrocodone.            CURRENT MEDICATIONS:  Medication list was reviewed.            SOCIAL HISTORY:  She never smoked.  She does not drink alcohol.            FAMILY HISTORY:  Her father died of myocardial infarction at age 11.            PHYSICAL EXAMINATION:  Vital signs:   Temperature 97.7, heart rate 63,      respiratory rate 18, blood pressure 166/57, O2 saturations 97% on room air.      General:  Pleasant, in no acute distress.  Heent:  Pupils are equal,      reactive to light.  There is no icterus, no pallor.  The oropharynx is      clear with no pharyngeal exudate.  The neck is supple without JVD, without      lymphadenopathy, without thyromegaly, with crackles in the left base which      cleared  after patient took a couple of deep breaths.  Heart:  S1, S2,      regular without murmurs, rubs or gallops.  Abdomen is soft, nontender with      positive bowel sounds, no hepatosplenomegaly.  Extremities:  Without      clubbing, cyanosis or edema.  Patient does have ulnar deviation and      deformity in her carpophalangeal joint in both hands.  Neurologic:  Grossly      nonfocal.            LABORATORY:  As stated above, patient had a chest x-ray with decubitus      views, which showed small bilateral pleural effusions, no infiltrates. CBC      within normal limits with the exception of a low hemoglobin of 8.7 and      hematocrit of 28.2.  Platelets 275.  Basic metabolic panel within normal      limits.  Alkaline phosphatase slightly elevated at 354.  AST, ALT, total      bilirubin within normal limits.            ASSESSMENT AND PLAN:  An 77 year old with complaints of cough and also with      left anterior chest pain which is pleuritic in nature.            PLAN:  Regarding the cough, I think that this is most likely secondary to      postnasal drip, and I am going to start patient on Flonase 2 puffs in each      nostril twice a day.  Regarding the chest pain, as stated above, this seems      to be pleuritic in nature.  The pain is increased with deep inspiratory      movements and with cough.  I would order a CT of the chest without contrast      to better evaluate the pleura of the left side, and rule out any possible      pleural abnormalities.  And also to rule out possible rib  fractures in a      patient with advanced osteoporosis.  The pleuritic chest pain may be      secondary to rheumatoid arthritis.                                          Electronic Signing MD: Ronelle Nigh, MD                  D 04/07/2007  3:58 P; T 04/07/2007  9:36 P; 9629 - - , [id number], #[id number]      CC:  Ronelle Nigh, MD

## 2011-02-15 NOTE — Consults (Signed)
ATTENDING MD:  Nino Glow, MD      CONSULTING MD: Anice Paganini, MD      ADMITTED:      02/03/2007      CONSULTED:     02/08/2007      ROOM:          3B  327 02            REASON FOR CONSULTATION:  Gram-negative bacteremia.            HISTORY OF PRESENT ILLNESS:  This is an 77 year old female with a history      of diabetes, rheumatoid arthritis, coronary artery disease,      hyperlipidemia.            The patient had a laparoscopic cholecystectomy on 01/28/2007 and was found      to have a necrotic gallbladder intraoperatively.  She was readmitted during      this hospitalization for acute abdominal pain, was found to have biliary      pancreatitis, dilated common bile duct, and underwent an ERCP on      02/06/2007, where she was found to have biliary sludge and underwent a      sphincterotomy.  There were no stones reported.  She was started on Zosyn.      Blood cultures in 1 out of 2 sets has since grown out Gram-negative rods.      Infectious disease has been requested to make further recommendations.            The patient has denied any fevers, chills, nausea, or vomiting.  Overall,      her abdominal pain has improved.            PAST MEDICAL HISTORY:  Is as noted above.            ALLERGIES:  The patient has an allergy to Biaxin for which she gets      dyspepsia.            CURRENT ANTIBIOTICS:  Include Zosyn.            PHYSICAL EXAMINATION:  The patient is afebrile, nontoxic, in no obvious      distress.  Her HEENT exam, no thrush.  Her neck is supple, chest is clear.      She had decreased breath sounds at the bases.  Heart exam revealed a normal      S1 and S2, there was a soft left upper sternal border murmur.  Abdominal      exam is soft and nontender, without any significant tenderness.      Extremities:  No edema, no digital emboli.            DIAGNOSTIC STUDIES:  Laboratory work revealed a white count of 6.7,      hematocrit of 22, platelets of 290.  BUN of 12, creatinine 0.8, AST 52, ALT       of 52, alkaline phosphatase of 513, amylase of 68, lipase of 510.  Blood      cultures, 1 out of 2 sets Gram-negative rods.            ASSESSMENT:  Gram-negative bacteremia secondary to biliary      pancreatitis/cholangitis secondary to biliary sludge after the patient      underwent a laparoscopic cholecystectomy.            The patient is overall clinically improved after her endoscopic retrograde  cholangiopancreatography and appropriate antibiotic therapy.  I expect that      she can go to oral antibiotics in the next 24 to 48 hours, depending on      final culture data.            RECOMMENDATIONS:      1.   We will continue Zosyn for now.      2.   Plan as noted above.      3.   Further recommendations depending on the patient's clinical course.            Thank you for allowing me to participate in this patient's care.                                          Electronic Signing MD: Anice Paganini, MD                  D 02/08/2007 11:06 P; T 02/10/2007  5:55 A; 1610 - - , [id number], #[id number]      CC:  Nino Glow, MD           Anice Paganini, MD           Alease Medina, MD

## 2011-02-15 NOTE — Consults (Unsigned)
ATTENDING MD:  Alease Medina, MD      CONSULTING MD: Ellender Hose, MD      ADMITTED:      04/03/2007      CONSULTED:      ROOM:          3B  327 01            REASON FOR CONSULTATION: Abdominal pain, nausea, vomiting.            HISTORY OF PRESENT ILLNESS:  The patient is pleasant 77 year old woman well      known to me hospitalized on 04/03/2007 with what sounds like a reaction to      a medication with acute angioneurotic edema, nausea, vomiting, abdominal      pain.  She has rheumatoid arthritis is on steroids, methotrexate.  She has      diabetes.  She was treated with Zofran and methylprednisolone, did well.      She had abdominal pain also during her evaluation here because of her      previous necrotic gallbladder.  She was seen by  Dr. Derrill Center who felt      that she probably had rib pain.            She is feeling substantially better really with only reflux kind of      symptoms at this point.  During hospitalization, she has had studies that      have included CAT scans and VQ scans, and blood work and nothing specific      has been found.            PHYSICAL EXAMINATION:  At the time of consultation reveals a pleasant,      77 year old African American woman in no acute distress.  Abdominal exam      really at this point is unremarkable.  The rest of the exam has been      described as normal, chart.            LABORATORY DATA: Her lab studies have included a white count that has been      persistently normal, an anemia that has been fairly stable and most      recently 9.4 hemoglobin, 30.6 hematocrit with normochromic, normocytic      indices and a platelet count in the 300,000.  Her chemistries have been      normal.  She has had an elevated alkaline phosphatase.  It is unclear if      this is from bone or liver.  Her INR is normal, and her other liver studies      essentially normal.  Her ALT normal.  AST has been slightly elevated on      several occasions.            IMPRESSION:   Gastroesophageal reflux disease.  I am not sure if her      alkaline phosphatase elevation is from bone or liver and if not yet done,      I would suggest it to be fractionated.  I would like for her to have a      proton pump inhibitor 38 minutes before a major meal, to have Carafate 1 g      at bedtime on an empty stomach.   I would be happy to see her as an      outpatient  also and re-evaluate.            Thank  you for allowing me to share in the care.   Sincerely,                                          ___________________________________     Date Signed: _______________      Ellender Hose, MD                  D 04/07/2007  4:04 P; T 04/07/2007  8:37 P; 1610 - - , [id number], #[id number]      CC:  Alease Medina, MD           Ellender Hose, MD

## 2011-02-15 NOTE — Consults (Unsigned)
ATTENDING MD:  Alease Medina, MD      CONSULTING MD: Ike Bene, MD      ADMITTED:      12/10/2006      CONSULTED:     12/12/2006      ROOM:          CCU 611 01            REASON FOR CONSULTATION:  GI bleed.            HISTORY OF PRESENT ILLNESS:  The patient is an 77 year old female who had      several bowel movements containing clots and maroon-colored stool.  She had      some dizziness and was brought to the emergency room.  Subsequently, she      has been admitted to the intensive care unit and has been transfused 2      units of packed cells, as well as undergone a bleeding scan.  She does      report having some pain in the left upper quadrant and subcostal regions.      Patient denies any nausea or vomiting.  She has a questionable history of      colon polyps in the past, but did have a colonoscopy within the past 5      years.  She did not show any other suspicious findings.            FAMILY HISTORY:  She has no family history of colon cancer.  She has not      had any pain with bowel movements.            PAST MEDICAL HISTORY:  Peptic ulcer disease, rheumatoid arthritis,      diabetes.            CURRENT MEDICATIONS:  Patient's medications at home did include daily      aspirin.  She has not been on any other blood-thinning medications.  Her      current hospital medication list is Catapres, folate, Lasix, Neurontin,      Glucotrol, regular insulin, Protonix, potassium.            ALLERGIES:  Iodine, clarithromycin and hydrocodone.            The patient has not had any history of intestinal or abdominal surgery.            SOCIAL HISTORY:  She denies any tobacco, alcohol or illicit drug use.            PHYSICAL EXAMINATION:  She is an elderly, thin, well-developed,      well-nourished female.  She is awake, alert and oriented x3.  Her vital      signs are all stable.  Pupils are equal and round.  Her sclerae are clear.      Her neck is supple and nontender.  Her chest is clear to  auscultation.      Heart is regular rate and rhythm.  Her abdomen is nondistended.  There are      no palpable masses.  She is tender to deep palpation with guarding in the      left upper quadrant.  Her extremities are atraumatic without edema.            LABORATORY:  Patient's labs:  Her initial hemoglobin was 8.7, currently      12.3.  Platelet count most recent was 138, coagulation studies are normal.  RADIOGRAPHS:  The patient's bleeding scan shows some suggestion of tracer      accumulation in the right upper quadrant near the hepatic flexure.            ASSESSMENT AND PLAN:  An 77 year old female with gastrointestinal bleed and      probably is lower in nature, although she does have a history of peptic      ulcer disease and is having some left upper quadrant pain and tenderness.      She is stable at this point and has responded to transfusion.  The plan is      for her to remain in the intensive care unit.  I recommended CT scan given      her abdominal findings, and she has been evaluated by Dr. Karl Ito for      endoscopy, which should include upper endoscopy, given her history.      Patient is aware that surgery may become a necessary option, but that      attempts to further localize the source or to avoid surgery if the bleeding      stops after aspirin cessation would be beneficial.                                          ___________________________________     Date Signed: _______________      Ike Bene, MD                  D 12/15/2006 10:36 A; T 12/15/2006  2:05 P; 4132 - - , [id number], #[id number]      CC:  Alease Medina, MD           Ike Bene, MD

## 2011-02-15 NOTE — Op Note (Unsigned)
SURGEON:             Ike Bene, MD      ADMITTED:            12/10/2006      PROCEDURE DATE: 01/01/2007      ROOM NUMBER:         4B  425 02            PREOPERATIVE DIAGNOSIS:  History of gangrenous cholecystitis.            POSTOPERATIVE DIAGNOSIS:  History of gangrenous cholecystitis.            PROCEDURE:  Laparoscopic cholecystectomy.            ANESTHESIA:  General and local.            SPECIMEN:  Gallbladder and contents.            BLOOD LOSS:  300 mL.            DRAINS:  10 JP.            COMPLICATIONS:  None.            INDICATIONS:  Patient with history of pneumobilia and emphysematous      cholecystitis with a cholecystotomy tube who now presents for      cholecystectomy.            FINDINGS:  Severe inflammation of the gallbladder with some mucosal      gangrene.  Because of all these features, the procedure is being billed      with a 22 modifier.            PROCEDURE:  Under general anesthesia, sterile prep and drape, and local      anesthesia, incision was made at the umbilicus for an open Hasson      insertion.  Pneumoperitoneum was developed, and 5-mm ports were placed in      the epigastrium x1 and right upper quadrant x2.  Adhesions were lysed to      expose the gallbladder.  It was grasped and retracted cephalad.  The drain,      which was a cholecystotomy tube, was removed under direct visualization.      The adhesions to the gallbladder surface were then taken off.  Because the      infundibulum could not be clearly exposed, a dome-down procedure was      performed using the Harmonic scalpel.  Finally, this came down to the      infundibulum.  Still, the common duct could not be easily seen, and so the      distal part of the gallbladder was transected, and this clearly showed it      was right at the insertion of the cystic duct.  This was ligated in with 0      PDS EndoLoop x2.  The gallbladder was placed within a bag.  There was a      stone seen.  It was retrieved.  The area was  copiously irrigated, and the      bleeding had stopped.  A 10 JP was placed in the gallbladder fossa.  The      ports and pneumoperitoneum were removed.  The umbilical fascia was closed      with 0 Vicryl and the skin with 4-0 Monocryl, and the drain secured with      3-0 nylon.  Sterile dressings were applied, and she was extubated and transferred to      recovery in stable condition.                                    ___________________________________     Date Signed: _______________      Ike Bene, MD                  D 01/01/2007  4:38 P; T 01/02/2007 11:22 A; 9750 - - , [id number], #[id number]      CC:  Alease Medina, MD           Ike Bene, MD

## 2011-02-15 NOTE — Op Note (Unsigned)
SURGEON:             Ellender Hose, MD      ADMITTED:            12/10/2006      PROCEDURE DATE:      ROOM NUMBER:         CCU 611 01            PROCEDURE:  Colonoscopy.            PREPROCEDURE DIAGNOSIS:  Lower gastrointestinal bleeding.            POSTPROCEDURE DIAGNOSIS:  Lower gastrointestinal bleeding, diverticulosis.            PROCEDURE IN DETAIL:  External and digital exam normal.  The Olympus video      colonoscope was advanced from rectum through descending colon toward      transverse colon.  The study was limited, however, by debris and old blood.      There was a significant amount of old blood that came through, but nothing      that appeared to be fresh.  Diverticula were noted.  I feel the probability      is great that she has a diverticular bleed.            Thank you very much for allowing [place] to share in her care.  I will follow      her with you and discuss.                                    ___________________________________     Date Signed: _______________      Ellender Hose, MD                  D 12/13/2006  2:49 P; T 12/13/2006  4:26 P; 6063 - - , [id number], #[id number]      CC:  Alease Medina, MD           Ellender Hose, MD           Ike Bene, MD

## 2011-02-15 NOTE — H&P (Unsigned)
ATTENDING MD:  Alease Medina, MD      ADMITTED:      02/03/2007      ROOM:          3B  327 02            REASON FOR ADMISSION:  Acute abdominal pain, status post cholecystectomy x1      month.  Clinical impression of pancreatitis.            BRIEF HISTORY: The patient is an 77 year old African-American female with a      history of rheumatoid arthritis, diabetes, coronary artery disease,      hyperlipidemia, who has been on intermittent therapy with low-dose steroids      throughout much of the past 10 years, also on potassium, furosemide,      Prevacid, glipizide.  Status post laparoscopic cholecystectomy by Dr. Halina Andreas on 01/01/2007.  At that time, a thickened and largely necrotic      gallbladder was removed.  The patient's living situation was shifted from      independent living to an assisted living situation at the [facility], and she was doing well post surgery, receiving physical therapy and      visiting nurse support as an outpatient.  Within the past 48 to 72 hours      she had a sudden worsening of her condition, particularly right-sided      abdominal pain which radiates through both the midline and the back.            PAST MEDICAL HISTORY:  The patient's medical history includes diabetes,      hypertension, bilateral hip replacements.            SOCIAL HISTORY:  She is a nonsmoker and nondrinker.            MEDICATIONS:  Her medications include Naprosyn 375 b.i.d., Lasix 20 per      day, prednisone 10 mg per day, folate, glipizide, and potassium.            PHYSICAL EXAMINATION:  At the present time she is lying in bed, with      reasonable comfort.  She has been n.p.o. and is receiving IV hydration.      Her blood pressure is 168/80, her pulse is 92.  Head, ears, eyes, nose, and      throat are remarkable for bitemporal wasting.  The lung fields are grossly      clear with decreased breath sounds at the bases.  Cardiac exam is      remarkable for physiologic first and  second heart sounds, a soft 1 to 2/6      systolic murmur is appreciated.  Her abdomen is remarkable for sensation of      pain and fullness radiating medially from the right upper quadrant.  The      patient does not have a positive psoas sign at the present.            PLAN:  The patient will be admitted to the hospital.  Consider      antimicrobial therapy, consider intravenous hydration, consider observation      for hypoglycemia.  Surgical consultation by Dr. Derrill Center in consideration      of endoscopic retrograde cholangiopancreatography.  ___________________________________     Date Signed: _______________      Alease Medina, MD                  D 02/05/2007 11:49 A; T 02/05/2007 12:22 P; 9438 - -  , [id number], #[id number]      cc:    Alease Medina, MD

## 2011-02-15 NOTE — Consults (Signed)
ATTENDING MD:  Nino Glow, MD      CONSULTING MD: Marni Griffon, MD      ADMITTED:      02/03/2007      CONSULTED:     02/09/2007      ROOM:          3B  327 02            REASON FOR CONSULTATION:  Abnormal electrocardiogram.            HISTORY OF PRESENT ILLNESS:  The patient is an 77 year old woman with a      history of hypertension and reported history of coronary artery disease.      The underwent cholecystectomy on 01/01/2007 and was admitted on 02/05/2007      for pancreatitis.  She underwent ERCP on 02/05/2007 which showed a large      amount of sludge.  The patient was treated medically.  She developed      bradycardia and subsequent EKG showed new T-wave inversions in leads V2      through V6.  She reports abdominal discomfort.  In retrospect, she also has      occasional shortness of breath and chest pain described as a "need to      belch."  She is not known to have coronary artery disease.  Her last stress      nuclear study was in June 2002 which showed no ischemia.            PAST MEDICAL HISTORY:  Includes hypertension and diabetes.  She also has      rheumatoid arthritis.  She is status post cholecystectomy in September, and      has a reported history of coronary artery disease.            ALLERGIES:  Include iodine, erythromycin, and hydrocodone.            MEDICATIONS:  Current inpatient medications include clonidine,      hydrochlorothiazide, and Zosyn.            SOCIAL HISTORY:  The patient does not smoke.            FAMILY HISTORY:  Her father had a myocardial infarction at age of 25.            REVIEW OF SYSTEMS:  No asthma.  No GI bleed.  All other systems as above or      are noncontributory.            PHYSICAL EXAMINATION:  The patient is a frail woman, lying in bed in no      apparent physical distress with a blood pressure of 172/89, heart rate 61,      respirations of 18.  HEENT exam is within normal limits with moist mucosal      membranes and clear conjunctivae.  Neck shows no  JVD or carotid bruits.      Lungs are clear.  Respirations unlabored.  Heart rhythm is regular with a      2/6 systolic murmur.  Abdomen is soft, nontender.  Lower extremities shows      no edema.  Distal pulses are weak bilaterally.  Skin is warm and dry.  The      patient is alert and oriented x3.  Mood and affect are appropriate.            DIAGNOSTIC STUDIES:  EKG from today shows sinus bradycardia, normal QRS  axis.  There are T-wave inversions in the precordial leads.  The inversions      from leads V2 through V6 are new, compared to 02/03/2007.  Labs:  White      count 6.7, hematocrit 26.9, platelets 290.  Albumin 2.2, potassium 4.2, BUN      of 12, creatinine 0.8, alkaline phosphatase of 513, ALT 94, AST of 52,      lipase 703.            IMPRESSION:      1.   Abnormal electrocardiogram with new T-wave inversions.  Reported      history of coronary artery disease.      2.   Pancreatitis.      3.   Diabetes.      4.   Hypertension.      5.   Rheumatoid arthritis.      6.   Bradycardia, likely secondary to clonidine.            RECOMMENDATIONS:      1.   Discontinue clonidine and add Norvasc for blood pressure control.      2.   Check troponin I.      3.   Adenosine nuclear study in the morning.      4.   Consider echocardiogram.      5.   Our team will continue to follow the patient with you.            Thank you very much for the opportunity to participate in the care of your      patient.            Mckinnley Cottier Alyson Locket, MD, [facility]      [facility]      Telephone:  [phone]                                          Electronic Signing MD: Marni Griffon, MD                  D 02/09/2007  1:56 P; T 02/10/2007  6:24 A; 9438 - - , [id number], #[id number]      CC:  Nino Glow, MD           Marni Griffon, MD

## 2011-02-15 NOTE — Progress Notes (Signed)
Nurse Brief Progress Note            PERMANENT            12/11/2006 15:39                        [facility]            [facility] - CCU                        [name], Miranda Wain. (Patient ID: [id number])                        Date of Service: 12/11/2006 15:39                        HPI/Events of Note: Admit to eICU                                    eICU Interventions: Minor-Communication with other healthcare providers and/or      family, Other: Medical records notification                                                Electronically Signed by: Oren Beckmann (RN)

## 2011-02-15 NOTE — Op Note (Signed)
SURGEON:             Odette Horns, MD      ADMITTED:            05/29/2005      PROCEDURE DATE:      ROOM NUMBER:         POO POO 12            First Assisant:  Sunday Spillers, PA-C            ANESTHESIOLOGIST:  Carolan Clines, M.D.            ANESTHESIA:  IV sedation, 0.5% Marcaine with epinephrine, 1% lidocaine.            PREOPERATIVE DIAGNOSIS:  Lipoma, left shoulder.            POSTOPERATIVE DIAGNOSIS:  Lipoma, left shoulder.            PROCEDURE:  Excision of left shoulder lipoma.            PROCEDURE IN DETAIL:  Patient was placed in right lateral decubitus      position.  After initiation of IV sedation and oxygen therapy, the patient's      left shoulder was prepped and draped in the usual sterile fashion.  The      anesthetic mixture was infiltrated into the skin and subcutaneous tissues      over the soft palpable subcutaneous nodule over the left scapula.  An      oblique linear incision was made and carried down through the thick skin      into the subcutaneous fat.  The capsule around the anterior surface of the      lipoma was incised.  There was underlying yellow globular fat.  There were      several tongues of fat into the subcutaneous tissues.  The lipoma was      excised with a combination of electrocautery and blunt dissection.  The      specimen was removed in several pieces.  After ensuring that the entire      lipomatous mass had been removed to digital and visual inpection, the cavity      was irrigated with normal      saline.  Hemostasis was verified.  The wound was closed in 2 layers.      Interrupted 3-0 Monocryl suture was used to reapproximate the subcutaneous      tissues, and 3-0 nylon vertical mattress suture was used to reapproximate      the skin.  A dressing was placed.  Ten ml of the anesthetic      mixture used for the procedure.  She tolerated the procedure well.  She was      taken to the recovery room in stable condition.                                          Electronic  Signing MD: Odette Horns, MD      D 05/29/2005 11:33 A; T 05/29/2005  9:35 P; [id number] - - , [id number], #[id number]      CC:  Alease Medina, MD           Odette Horns, MD

## 2011-02-15 NOTE — H&P (Unsigned)
ATTENDING MD:  Alease Medina, MD      ADMITTED:      12/10/2006      ROOM:          CCU 611 01            REASON FOR ADMISSION:  Acute lower gastrointestinal bleeding.  Known      history of rheumatoid disease, history of diabetes, hypertension.            BRIEF HISTORY:  The patient is an 77 year old African-American female      well-known to me from many years of continuous care for rheumatoid disease.      The patient's medication regimen currently includes Naprosyn, Lasix,      prednisone 10 mg a day, folate, glipizide and potassium.  The patient had      been maintained on methotrexate for an extensive period in the past.  Her      current glipizide dose is two 10-mg tablets b.i.d.            The patient was seen in the office over the past several weeks for a      complaint of progressive back pain presumed to be related to a combination      of rheumatoid and degenerative arthritis. This was confirmed with imaging      studies.  The patient was referred to Dr. Leandrew Koyanagi for consideration of      local injection therapy.  A steroid injection had been entered for later in      the week of her admission.  On the night of her admission, the patient      began to have a feeling downward urgency.  She had several bowel movements      all of which were stained with marroonish jelly semi-clotted stool.  The      patient felt lightheaded and summoned her family and was brought to the      emergency department.            PHYSICAL EXAMINATION:  At the time of my examination, the patient is lying      comfortably in a medical bed.  Dr. Dalbert Mayotte has familiarity and has already      seen the patient and a bleeding scan has confirmed a probable diverticular      bleed.  The patient's physical examination is remarkable for a blood      pressure of 150/82, pulse 92.  Head, eyes, ears, nose, and throat are      grossly unremarkable.  The lungs are clear.  The cardiovascular examination      is essentially normal and  physiologically split first and second heart      sounds.  The abdomen is soft and nontender.  The musculoskeletal exam is      remarkable for chronic rheumatoid changes and synovitis at both wrists,      little flexion, little extension also extending to left elbows.  She has a      poor range of motion of her left shoulder.      PLAN:  The patient will be admitted to the hospital.  As noted, a bleeding      scan has already been ordered by Dr. Dalbert Mayotte who has seen the patient in the      past for gastrointestinal problems.  The patient will be transfused 2 units      of packed cells. Appropriate diagnostic studies will  be ordered.  Any      questions, please call Dr. Chestine Spore at [phone].                                    ___________________________________     Date Signed: _______________      Alease Medina, MD                  D 12/11/2006  5:19 P; T 12/11/2006  5:54 P; 2952 - -  , [id number], #[id number]      cc:    Alease Medina, MD

## 2011-02-15 NOTE — Discharge Summary (Unsigned)
ATTENDING MD:  Alease Medina, MD      ADMITTED:      12/10/2006      DISCHARGED:    01/09/2007            DISCHARGE DIAGNOSES:      1.   Diverticular disease with hemorrhage.      2.   Acute cholecystitis.      3.   Rheumatoid arthritis.      4.   Bacteremia.      5.   Urinary tract infection.      6.   Calculus gallbladder.      7.   Duodenitis.      8.   Long-term steroid use.            PROCEDURES:      1.   A laparoscopic cholecystectomy was performed by Dr. Derrill Center.      2.   Endoscopy of small intestine performed by Dr. Dalbert Mayotte.            BRIEF HISTORY:  The patient is an 77 year old African-American, well-known      to me from many years of continuous care for rheumatoid disease.  The      patient's medications usually include Naprosyn, Lasix, prednisone 10 mg per      day, folate, glipizide, and potassium.  Her current glipizide dose is two      10 mg tablets b.i.d.  The patient has been maintained on methotrexate for      an extensive period of time in the past.  The patient was seen in the      office over the past several weeks for complaints of progressive back pain,      presumed to be related to a combination of rheumatoid disease and      degenerative arthritis.  The patient had seen Dr. Leandrew Koyanagi for      consideration of local injection therapy.  On the night of her admission,      the patient began to feel a downward urgency, and then several bowel      movements had maroonish jelly, semi-clotted stool.  The patient felt      lightheaded, summoned her family and was brought to the emergency      department.            HOSPITAL COURSE:  Her physical examination is as noted in the narrative      dictation of 12/10/2006.  She was seen by Dr. Dalbert Mayotte who has followed her      in the past.  He performed diagnostic studies which demonstrated duodenitis      and diverticular diagnosis; however, the patient continued to have      localizing abdominal pain and was noted to have coliform sepsis and       apparent emphysematous cholecystitis with pneumobilia noted on abdominal      films.  The patient was seen in consultation by Dr. Derrill Center; ultimately,      after stabilization and the treatment of sepsis, the patient underwent a      laparoscopic cholecystectomy.  After a slow but progressive recovery      period, the patient was transferred to the care of Dr. Hoy Finlay at      [facility] with the ultimate goal of independent living at      home and return to my care.  ___________________________________     Date Signed: _______________      Alease Medina, MD                  D 02/18/2007  9:35 P; T 02/19/2007  5:40 A; 0347 - - , [id number], #[id number]      CC:  Alease Medina, MD

## 2011-02-15 NOTE — Consults (Unsigned)
ATTENDING MD:  Alease Medina, MD      CONSULTING MD: Ike Bene, MD      ADMITTED:      02/03/2007      CONSULTED:      ROOM:          3B  327 02            REASON FOR CONSULTATION:  Abdominal pain.            HISTORY OF PRESENT ILLNESS:  This patient, who approximately a month ago,      was admitted for initially a GI bleed but then developed cholecystitis.      She had a cholecystostomy tube followed by a laparoscopic cholecystectomy,      no further bleeding, ultimately was discharged home, and had a follow up      appointment where she had returned to her baseline status, tolerating diet,      not having any further pain.  She states now that approximately a day or 2      ago, she began having some abdominal pain, more in the upper abdomen, had      called for an appointment but due to the severity of this, had to come into      the emergency room.  Her evaluation in the ER, thus far, significant for      pancreatitis.            PAST SURGICAL/MEDICAL HISTORY:  Unchanged from her previous exam.            PHYSICAL EXAMINATION:  Today, she is awake, alert, and oriented x3.  She is      in no acute distress.  Temperature is 98.5, blood pressure 188/77, pulse      81, respirations 20, and 100% saturations.  Her pupils are equal and round      and the sclerae appear clear.  Her neck is supple, nontender.  Chest is      clear to auscultation.  Heart shows regular rate and rhythm.  Her abdomen      is nondistended but diffusely tender mostly in the epigastrium with      guarding.  Extremities are atraumatic without edema.            SIGNIFICANT FINDINGS:  Apparently, the patient had an ultrasound that was      significant for no gallbladder but no other significant findings.  Her      laboratories show a white count of 9.1, hemoglobin 10.4.  Electrolytes are      normal.  Her amylase is 1700, lipase 17,200, AST 658, ALT 348, and alkaline      phosphatase 842.            IMPRESSION AND PLAN:  The patient  clearly has pancreatitis, probably      related to retained stone or debris in her biliary tree.  The key will be      to image the current state of her pancreas and follow along her      laboratories and exams and that if all of them normalize, then passage of a      stone will allow for everything to heal and then make determination about      an endoscopic retrograde cholangiopancreatography to rule out any further      retained stones.  If her laboratories do not normalize, then she will have      to have  an endoscopic retrograde cholangiopancreatography done urgently in      order for decompression.                                          ___________________________________     Date Signed: _______________      Ike Bene, MD                  D 02/04/2007  8:20 A; T 02/04/2007  8:52 A; 6962 - - , [id number], #[id number]      CC:  Alease Medina, MD           Ike Bene, MD

## 2011-02-15 NOTE — Op Note (Unsigned)
SURGEON:             Ellender Hose, MD      ADMITTED:            12/10/2006      PROCEDURE DATE:      ROOM NUMBER:         CCU 611 01            PROCEDURE:  Esophagogastroduodenoscopy.            PREPROCEDURE DIAGNOSIS:  Gastrointestinal bleeding.            POSTPROCEDURE DIAGNOSIS:  Gastrointestinal bleeding, not upper;      duodenitis.            PROCEDURE IN DETAIL:  After sedation by anesthesia with medications      including propofol, the Olympus video upper endoscope was advanced from      esophagus to postbulbar duodenum.  Instrument withdrawn.  Retroflexion      achieved in the cardia.  Findings were those of duodenitis with no active      bleeding.  No ulceration.  Tolerated well.  Kept in the critical care      unit.            PLAN:  Colonoscopy, evaluate subsequently.                                    ___________________________________     Date Signed: _______________      Ellender Hose, MD                  D 12/13/2006  2:48 P; T 12/13/2006  4:23 P; 1610 - - , [id number], #[id number]      CC:  Alease Medina, MD           Ellender Hose, MD           Ike Bene, MD

## 2011-02-16 NOTE — Consults (Signed)
BLONDELL, LAPERLE      MRN:          [id number]      Account:      [id number]      Document ID:  [id number]      Service Date: 08/20/2009            Admit Date: 08/18/2009            Patient Location: M323-01      Patient Type: I            CONSULTING PHYSICIAN: Bary Richard. MD            REFERRING PHYSICIAN: Mel Almond MD                  CONSULTING SERVICE:      Surgery.            REASON FOR CONSULTATION:      This is a very pleasant 77 year old female who was referred to [place] by Dr.      Jaynie Collins of [facility].            CHIEF COMPLAINT:      Bright red blood per rectum.            HISTORY OF PRESENT ILLNESS:      Patient is a 78 year old female who was in her usual state of health until      several days ago when she started feeling lightheaded and noticed that she      had blood in her bowel movements.  She was admitted to the floors and a      workup was started.  During her workup, she was found to have a stable      hemoglobin.  GI was consulted and they noted that because her bright red      blood per rectum initially did not cause her hemoglobin to drop that they      were going to hold on a colonoscopy.  During her hospitalization, which has      been thus far for 3 days, she had no symptomatology related to her bright      red blood per rectum, but she would occasionally have a bowel movement that      had blood in it.  During one of these episodes, they got a bleeding scan      which showed active bleeding in the right side of the colon.  Despite this,      her hemoglobin stayed steady and she only required 2 units of packed red      blood cells.  At this point in time, the patient states that her last      bloody bowel movement was last night.  She feels completely comfortable,      she has no complaints.  Her most recent hemoglobin is 10; the one prior to      that was 10.9, and the one prior to that was 11.1.  She is not lightheaded,      but she does have some arm and  generalized weakness, and no other      complaints at this time.            PAST MEDICAL HISTORY:      Significant for rheumatoid arthritis, diabetes, hypertension,      hyperlipidemia, history of a fever of unknown origin with sepsis which was      thought  to be biliary, a history of diverticulosis, a past history of GI      bleeds, and a history of DVT.                                   Page 1 of 3      MELAYA, HOSELTON      MRN:          [id number]      Account:      [id number]      Document ID:  [id number]      Service Date: 08/20/2009                  PAST SURGICAL HISTORY:      Significant for IVC filter, cholecystectomy for gangrenous gallbladder in      2008, bilateral total knee replacement done in 2004 and 2005, and history      of removal of lipoma.            FAMILY HISTORY:      Significant to her dad having heart disease and a maternal aunt having      diabetes.            SOCIAL HISTORY:      She says she quit smoking many, many years ago.  She does not drink      alcohol.  She denies any recreational drug use.  She lives in [facility] in an assisted living apartment.            MEDICATIONS:      As listed in her chart.            ALLERGIES:      No known drug allergies.            REVIEW OF SYSTEMS:      She denies any chest pain, no heart palpitations, no shortness of breath,      no dyspnea on exertion, no dysuria or hematuria.  No nausea, vomiting,      diarrhea, constipation, or abdominal pain.            PHYSICAL EXAMINATION:      GENERAL:  She is awake, alert, does not appear to be in any distress.      LUNGS:  Clear to auscultation bilaterally, no wheezes, rales, or rhonchi.      HEART:  Regular rhythm and rate with no murmurs, gallops, or clicks.      ABDOMEN:  Soft, nontender, nondistended, no pain.      RECTAL:  At this time, the rectal examination was deferred due to the fact      that she just got out of bed and was comfortably seated in a chair.            LABORATORY DATA:       Show a hemoglobin of 10, and a bleeding scan that shows some active      bleeding in the right colon which was performed last night.            PLAN:      At this point in time, with this patient having multiple medical problems      and being 77 years old, I recommend that she be treated with the least      invasive process first until we have exhausted all those options before  having any surgery.  That would include a colonoscopy should she rebleed or      drop her hemoglobin, or followed by [facility] if she is actively bleeding enough      to make her hypertensive; they can do an embolization or a coil.  Should                                   Page 2 of 3      MCKINLEIGH, SCHUCHART      MRN:          [id number]      Account:      [id number]      Document ID:  [id number]      Service Date: 08/20/2009            all these things fail, then surgery would be a viable option, but knowing      exactly where her bleeding is coming from, as which sometimes bleeding      scans can be inaccurate, would be most helpful in this very pleasant      patient.            Thank you so much for the consult.  We will follow closely with you.                        Electronic Signing Provider            D:  08/20/2009 12:30 PM by Dr. Bary Richard., MD ([id number])      T:  08/20/2009 13:45 PM by [id number]                  GM:[id number] Jaynie Collins MD      Liliana Cline. MD                                   Page 3 of 3      Authenticated by Jamie Brookes, MD On 08/21/2009 11:00:16 AM

## 2011-02-16 NOTE — Consults (Signed)
Account Number: [id number]      Document ID: [id number]      Admit Date: 04/21/2009      Service Date: 05/05/2009            Patient Location: K440-10      Patient Type: I            CONSULTING PHYSICIAN: Blinda Leatherwood MD            REFERRING PHYSICIAN: Alease Medina MD                  CONSULTING SERVICE:      Physical medicine rehabilitation.            REASON FOR CONSULTATION:      Significant deconditioning.            CHIEF COMPLAINT:      Generalized weakness.            HISTORY OF PRESENT ILLNESS:      The patient is a 77 year old female who was admitted on April 21, 2009      after a change in mental status for 24 hours.  She was found to be      significantly febrile with a temperature of 103 and sepsis.  She has a      history of biliary obstruction with      pancreatitis.  She was seen by Dr. Janne Napoleon of general surgery, as well as      infectious disease who placed her on IV antibiotics.  She was also hydrated      with IV fluids.  With treatment, her hospital course indicates that she is      improved with her mental status returning to baseline.  It is possible that      her sepsis had a biliary source, though possible pneumonia as well.  Her      leukocytosis has resolved and she clinically improved and has overall been      asymptomatic.  She completes her IV antibiotics on May 06, 2009.  She      has been able to participate with physical therapy during her hospital      course, performing some strengthening exercises of bilateral hips and knee.       She requires close supervision and cueing for exercise.  She is minimum      assist off of a chair with cushion and she is otherwise independent with      her bed mobility.  She is able to ambulate      approximately 200 feet with a rolling walker.  She is supervision      level for her overall mobility.  Occasionally she requires minimum assist for      sit      to stand, depending on the surface height.  She has not been evaluated by      occupational  therapy yet.            PAST MEDICAL HISTORY:      1.  Diabetes.      2.  Hypertension.      3.  Rheumatoid arthritis on chronic prednisone therapy.      4.  History of coronary disease.      5.  Hyperlipidemia.      6.  History of diverticulosis.      7.  History of gastrointestinal bleed.      8.  History of DVT status post IVC  filter.      9.  History of laparoscopic cholecystectomy for gangrenous gallbladder in      2008, complicated by postoperative biliary pancreatitis and bacteremia.      10.  Bilateral total knee replacement, the right in 2004, left in 2005.      12.  History of lipoma surgery.            ALLERGIES:      BIAXIN, resulting in shortness of breath.  DILAUDID      results in shortness of breath and IODINE also causes shortness of breath.                  MEDICATIONS:      Norvasc, aspirin, Lovenox, folate, Lasix, Neurontin, Glucotrol, Atarax,      Lantus, magnesium oxide, Protonix, Zosyn through January 7, prednisone and      Carafate abuse.            SOCIAL HISTORY:      The patient is currently retired.  She lives in [facility] in      assisted living apartments in which staff come and check on her twice a      week.  She manages her own medications as well as receives Meals on Wheels.            FUNCTIONAL HISTORY:       She was independent prior to admission.  Currently, with therapy, she is      modified independent with some supervision and occasional contact guard      assist to minimum      assist for mobility.  She has not been evaluated by occupational      therapy.  She reports that she does not have anyone available to assist her      at home to provide initial 24-hour assistance and supervision.            REVIEW OF SYSTEMS:      She reports numbness in bilateral lower limbs related to diabetes.  She      denies any headache, visual changes, chest pain, palpitations, shortness of      breath, nausea, vomiting, diarrhea, constipation, abdominal pain, numbness,      tingling,  difficulty with emptying her bowel or bladder.  She does report      feeling overall weakness since she has been in bed for 2 weeks.            PHYSICAL EXAMINATION:      VITAL SIGNS:  Temperature 97.9, pulse 65, respiration 18, blood pressure      140/73, pulse oximetry is 99% on room air.      GENERAL:  The patient appears stated age in no acute distress.  She is      well-developed.      HEENT:  Head is atraumatic.  She has mildly dry mucous membranes.      Extraocular movements are intact.      NECK:  Supple.      CHEST:  S1, S2.      LUNGS:  Clear to auscultation, decreased at the bases.      ABDOMEN:  Soft, nondistended.  She has some mild tenderness in the left      quadrant where Lovenox injections have been given.  She has some mild      ecchymosis in that area.      EXTREMITIES:  No significant cyanosis, clubbing, edema, or calf tenderness.  NEUROLOGIC:  The patient is awake, alert, and oriented to self and      situation.  She is able to give accurate year, month, and she is able to      name our president.  Basic speech, language and cognition appears to be      intact.  She moves all 4 limbs symmetrically.  Strength is 4+ to 5- in all      segments bilateral upper and lower limb.  She follows commands      consistently.  She has decreased light touch sensation in the stocking      distribution in bilateral lower limbs.      SKIN:  She has venous stasis changes bilateral lower limbs.            DIAGNOSTIC STUDIES AND LABORATORY DATA:      As per results and reports in IDX.            IMPRESSION AND PLAN:      The patient is a very pleasant 77 year old female who presents with      debility secondary to recent sepsis syndrome from a possible biliary source      versus pneumonia.  Given her significant deconditioning and her living      situation, I think she might benefit from a brief acute inpatient      rehabilitation stay to participate in intensive interdisciplinary therapies      including  physical therapy and occupational therapy in order to increase      her functional independence so that she may need transition back into her      assisted living apartment at a modified independent level.  I will discuss      this with our admissions office as well as with the primary team.  The      patient is agreeable to coming to acute inpatient rehabilitation prior to      transition home.                        Electronic Signing Provider      _______________________________     Date/Time Signed: _____________      Blinda Leatherwood MD ([id number])            D:  05/05/2009 15:24 PM by Dr. Blinda Leatherwood, MD ([id number])      T:  05/05/2009 16:06 PM by [id number]          Everlean Cherry: [id number]) (Doc ID: [id number])                  XB:[id number] Wang MD      Norval Morton MD

## 2011-02-16 NOTE — Discharge Summary (Signed)
Account Number: [id number]      Document ID: [id number]      Admit Date: 03/25/2009      Discharge Date: 03/29/2009            ATTENDING PHYSICIAN:  Norval Morton, MD                  DIAGNOSES AT TIME OF DISCHARGE:      Fever, diarrhea, pneumonitis, congestive heart failure, diabetes mellitus,      hypertension, and rheumatoid disease.            BRIEF HISTORY:      The patient is a 77 year old female well known to this practice, who      presents with generalized weakness and acute febrile illness on the      above-noted date.  The patient has been reasonably stable living in the      [facility] in an assisted living situation.  on a baseline      medication regimen that includes magnesium oxide, Protonix, prednisone 10      mg daily, folic acid, amlodipine 5 mg daily, hydroxyzine 50 mg b.i.d. p.o.      p.r.n., glipizide 5 mg p.o. b.i.d., sucralfate, and gabapentin.  The      patient became progressively ill for the 48 hours prior to admission.  She      presented to the emergency department feeling poorly.  She was noted to      have elevated temperature and white count with cough and bilateral      infiltrates higher than her level of rheumatoid disease.            MEDICATIONS:      As noted.            PHYSICAL EXAMINATION:      As noted in the narrative dictation of the history and physical included in      this chart.  Of particular note, is the absence of jugular venous      distention, scattered upper airway rhonchi, and decreased breath sounds      bilateral bases.      CARDIAC:  Regular.      EXTREMITIES:  Demonstrated classic rheumatoid deformities.            HOSPITAL COURSE:      The patient received parenteral Levaquin following appropriate blood,      urine, and sputum cultures.  The patient's antihypertensive regimen was      optimized with the use of clonidine.  She was discharged on ciprofloxacin      on November 29, with _____ after being afebrile for 24 hours without      dyspnea and will be  seen in the office in followup.                        Electronic Signing Provider      _______________________________     Date/Time Signed: _____________      Alease Medina MD [phone])            D:  04/24/2009 18:22 PM by Dr. Alease Medina, MD [phone])      T:  04/25/2009 07:07 AM by [id number]          Everlean Cherry: [id number]) (Doc ID: [id number])                        OZ:[id number] Chestine Spore MD

## 2011-02-16 NOTE — Progress Notes (Signed)
Account Number: [id number]      Document ID: [id number]      Admit Date: 05/06/2009      Service Date: 05/09/2009            Patient Location: M526-01      Patient Type: I            PHYSICIAN/PROVIDER: Blinda Leatherwood MD                  PRIMARY REHABILITATION DIAGNOSIS:      The patient presents with dysfunction in self care, mobility, and endurance      secondary to debility from recent sepsis syndrome related to infection that      is possibly biliary versus respiratory in source.            MEDICAL MANAGEMENT:      The patient received her last dose of IV antibiotics on May 06, 2009.      She has remained afebrile since her acute inpatient rehabilitation stay.      Her white blood cell count has been followed and it is currently within      normal limits at 9.2.  Her anemia remained stable with hemoglobin 9.7,      hematocrit of 31.0.  Her platelet count remains mildly elevated at 448 and      this is relatively stable over the past several days.  Her electrolytes      were relatively unremarkable.  Her BUN is elevated at 36 and creatinine is      within normal limits at 1.1.  She has a low albumin level 2.9.  Her      alkaline phosphatase is elevated at 184, but this is a decreasing trend      over time.  Her AST and ALT are within normal limits.  Urine culture is      negative for any UTI.  Her MRSA screening nares culture is negative for      growth.  Her blood sugars are being followed and they have been ranging      from the 130s to the 330s.  She is currently on glipizide and Lantus for      blood sugar control.  Her blood pressure remained well controlled.  May      consider adjusting her diabetes medications for improved diabetic control.            FUNCTIONAL STATUS UPDATE:      She is currently independent with all of her bed mobility and sitting      balance.  She is supervision for bed and mat transfers and sit to stand.      She is minimum assist for car transfers.  She is supervision for managing       wheelchair parts, moderate assist for wheelchair propulsion 40 feet.  She      is independent to supervision for standing balance with upper extremity      support, supervision to contact guard assist for standing balance without      upper extremity support.  Contact guard assist with reaching and standing.      She is supervision for ambulating over level surfaces 150 feet with a      rolling walker, minimum to moderate assist for negotiating curbs, contact      guard assistance for negotiating steps.  She is currently minimal assist      for donning her underclothes, supervision for shirt, minimum assist for  pants, moderate assist for socks, minimum assist for bathing, supervision      for grooming.  Independent for eating.            RECOMMENDATIONS:      It is recommended that patient participate in physical therapy and      occupational therapy, recreational therapy 1 to 2 hours each per day, 5 to      6 days per week for approximately 1 week.            ESTIMATED FUNCTIONAL OUTCOMES:      The patient will independently transfer in and out of bed least restrictive      assistive device, will independently ambulate 150 feet over level surfaces      with the least restrictive assistive device, negotiate a 6-inch curb and      ramp with supervision, negotiate four 6-inch stairs with bilateral hand      rails, and transfer in and out of car with supervision.  She will be      modified independent for all of her ADLs and functional transfers using      adaptive equipment and will be modified independent with microwaving frozen      dinner in the kitchen.                        Electronic Signing Provider      _______________________________     Date/Time Signed: _____________      Blinda Leatherwood MD ([id number])            D:  05/09/2009 13:03 PM by Dr. Blinda Leatherwood, MD ([id number])      T:  05/09/2009 15:14 PM by [id number]          Everlean Cherry: [id number]) (Doc ID: [id number])                  IH:[id number] Wang MD

## 2011-02-16 NOTE — H&P (Signed)
Account Number: [id number]      Document ID: [id number]      Admit Date: 03/25/2009            Patient Location: [id number]      Patient Type: I            ATTENDING PHYSICIAN: Norval Morton, MD                  REASON FOR ADMISSION:      Interstitial pneumonia, known history of rheumatoid disease with pulmonary      complications, known history of biliary obstruction with pancreatitis,      known history of coronary artery disease with hypertension.            HISTORY:      The patient is a 77 year old African-American female who has been generally      doing well regarding both the activity of her rheumatoid disease and      response to treatment, management of her diabetes with oral glipizide, and      general level of activity.  She still remains living independently in the      [facility].  She is known to have a baseline chest x-ray showing      fibronodular scarring at the left apex of the lung.  However, on the day of      her admission, she presented to the emergency department feeling poorly.      She had an elevated temperature and white count, was noted to have a cough      and bilateral infiltrates.            PAST MEDICAL HISTORY:      Includes the above diagnoses.            MEDICATIONS:      Include glipizide, oral prednisone, Naprosyn, Lasix, folic acid,      intermediate Humulin N and insulin coverage.            PHYSICAL EXAMINATION:      GENERAL:  Remarkable for joint deformities consistent with rheumatoid      disease.  She generally appears to be ill and weak with bitemporal wasting.            VITAL SIGNS:  Her blood pressure is 150/80, her pulse is 88, and      respiratory rate is 24.  She has a temperature of approximately 100.      NECK:  She does not have evidence of jugular venous distention.      CHEST:  Remarkable for some scattered upper airway rhonchi that clear with      cough.      CARDIAC:  Remarkable for a regular rate and rhythm.      ABDOMEN:  Nondistended.  There are no  palpable masses.      EXTREMITIES:  Without significant edema.            PLAN:      The patient will be admitted to the hospital because of an elevated CPK and      troponin.  Cardiology has been consulted.  She will be admitted to a      medical bed.  She has received parenteral Levaquin following the obtaining      of appropriate blood, urine, and sputum cultures.  The patient will be      placed on 6B telemetry.  Other diagnostic studies will be ordered as  indicated.                        Electronic Signing Provider      _______________________________     Date/Time Signed: _____________      Alease Medina MD [phone])            D:  03/26/2009 21:21 PM by Dr. Alease Medina, MD [phone])      T:  03/26/2009 22:02 PM by [id number]          Everlean Cherry: [id number]) (Doc ID: [id number])                  MW:[id number] Chestine Spore MD

## 2011-02-16 NOTE — Discharge Summary (Signed)
[name], [name]      MRN:          [id number]      Account:      [id number]      Document ID:  [id number] [id number]                  Admit Date: 04/21/2009      Discharge Date: 05/06/2009            ATTENDING PHYSICIAN:  Norval Morton, MD                  DISCHARGE DIAGNOSES:      This septicemia pneumonitis, diabetes mellitus, confusional state,      rheumatoid arthritis, and personal history of venous thromboembolism.            BRIEF HISTORY:      The patient is a 77 year old female with a history of rheumatoid disease,      brought to the emergency department from the [facility] with      a history of a temperature of 103, hypoglycemia, decreased activity and      mental status, having been asleep for approximately 36 hours prior to her      admission.  The patient is known to Dr. Janne Napoleon from the surgical service      because of a prior cholecystectomy in 2008, following a cholecystostomy      tube for sepsis with persistent elevation of LFTs and pain necessitating      ERCP in October 2000 by Dr. Dalbert Mayotte            The patient has a history of bilateral femoral DVT in May of 2001, had an      IVC filter placed.  The rheumatoid disease has been present all of her      adult life.  Her physical examination is as noted in the narrative      dictation on the day of her admission and in Dr. Annabelle Harman surgical      consultation.  At the time of his consultation, he felt that there was no      clinical evidence other than a mile cholestatic picture and did not feel      that the biliary tree was necessarily the source of her sepsis with the      possibility of a genitourinary sepsis also being considered.  Although      initial urine culture was nondiagnostic, the patient was found to have      Klebsiella growing in 1 of 2 blood cultures, the patient was started on      Levaquin, was hydrated in the hospital, her mental status improved.  She      was switched to Zosyn after sensitivities, and continued to  improve for the      final 2 days of her hospitalization.            DISPOSITION:      She was a recipient of the rehabilitative services and physical and      occupational therapy to help to prepare for the transition to outpatient      follow up with both infectious disease and my practice were arranged and      arrangements were made for the patient to return to her home facility.                        Electronic  Signing Provider            D:  07/19/2009 18:56 PM by Dr. Alease Medina, MD [phone])                                   Page 1 of 2      [name], [name]      MRN:          [id number]      Account:      [id number]      Document ID:  [id number]                  T:  07/20/2009 10:58 AM by [id number]                        cc:     Norval Morton MD                                   Page 2 of 2      Authenticated and Edited by Alease Medina, MD On 08/08/09 10:28:31 AM

## 2011-02-16 NOTE — Consults (Signed)
Account Number: [id number]      Document ID: [id number]      Admit Date: 04/21/2009      Service Date: 04/22/2009            Patient Location: [id number]      Patient Type: I            CONSULTING PHYSICIAN: Royann Shivers MD            REFERRING PHYSICIAN: Alease Medina MD                  CONSULTING SERVICE:      Cardiology.            HISTORY OF PRESENT ILLNESS:      The patient is a 77 year old female who is admitted after a change in      mental status over the past 24 hours.  The patient has become more and more      lethargic, and was brought in by the family.  This patient was found to be      severely afebrile to a fever of 103.  The patient has a history of biliary      obstruction with pancreatitis.  The cardiology consultation is requested by      Dr. Waunita Schooner to evaluate the patient's cardiology status prior to a      possible procedure.  This patient has no history of significantly abnormal      cardiac findings.  The patient has undergone evaluation in May of 2010,      including a negative cardiac nuclear scan, and had an echocardiogram at      that time as well.  The study showed a 65% ejection fraction with very mild      aortic stenosis.  The study was unchanged from 2 years prior.            PAST MEDICAL HISTORY:      Includes that noted above, the patient's diabetes, esophageal reflux, hip      osteoarthritis, hypertension, rheumatoid arthritis, right total hip      arthroplasty in October 2004, a diverticular hemorrhage, laparoscopic      cholecystectomy in 2008, history of biliary obstruction and acute      pancreatitis in 2009, with history of bacteremia.            REVIEW OF SYSTEMS:      CONSTITUTIONAL:  The patient does have fever and fatigue and decreased      mental status.      RESPIRATORY:  No shortness of breath.      CARDIOVASCULAR:  No chest pain, no history of cardiac problems.      GASTROINTESTINAL:  History of biliary obstruction.      GENITOURINARY:  No urinary tract issues.       MUSCULOSKELETAL:  The patient is weak and unable to walk.      HEMATOLOGIC:  No specific abnormalities.      NEUROLOGIC:  Decreased mental status.      ENDOCRINE:  History of diabetes.      PSYCHIATRIC:  Dementia.      SKIN:  No rashes.      ENMT:  No swallowing problems.            ALLERGIES:      Include shortness of breath with DILAUDID, CLARITHROMYCIN, and IODINE.            SOCIAL HISTORY:  No cigarette or alcohol abuse.            FAMILY HISTORY:      Not contributory.            PHYSICAL EXAMINATION:      VITAL SIGNS:  Shows a blood pressure of 110/51, heart rate 56, temperature      97.3 currently, 103.9 earlier today at 3 a.m.      ENMT:  No gum or palate problems.      EYES:  No conjunctivitis.      NECK:  No jugular venous distention.      CARDIOVASCULAR:  There is a II/VI systolic murmur left sternal border.      Heart palpation is normal, no bruit over the abdominal aorta or femoral      arteries.  There is no peripheral edema.      RESPIRATORY:  Effort is normal.      LUNGS:  Clear to auscultation.      ABDOMEN:  Soft with epigastric and some right upper quadrant tenderness      that is mild to moderate.      MUSCULOSKELETAL:  Weakened equally bilaterally.      EXTREMITIES:  Digits and nails show no abnormalities.      SKIN:  No rashes.      NEUROLOGIC:  The patient is disoriented and quiet, but is awake.            LABORATORY DATA:      The EKG shows sinus rhythm, poor R-wave progression, nonspecific ST-T      change.  The patient's laboratory values show a white count 16,700;      hemoglobin 9.8; hematocrit 29.9; platelet count is 156,000.  Sodium 136,      potassium 3.8, chloride 102, CO2 22, BUN 25, creatinine 1.6.  ALT, AST      elevated at 115 and 136.  Troponin-I 0.14, 0.11, and 0.09.            ASSESSMENT:      1.  This patient presents with change in mental status, probably biliary      sepsis, although that has not been proven at this point.      2.  History of normal cardiac nuclear  scan 6 months ago.  I doubt the      significance of the borderline elevation in the patient's troponin-I      values.      3.  Renal insufficiency.      4.  Dementia.            PLAN:      From a cardiac standpoint, there is no cardiac contraindication to the      patient having a procedure required for treatment of this patient's      infectious process.  Certainly, the patient is at high risk because of her      presentation and age, but her cardiac status is currently stable, and      currently, her hemodynamic status is stable.  We will follow with you.                        Electronic Signing Provider      _______________________________     Date/Time Signed: _____________      Royann Shivers MD [phone])            D:  04/22/2009 15:55 PM by Dr. Marcie Bal. Franchot Erichsen,  MD [phone])      T:  04/22/2009 16:14 PM by [id number]          Everlean Cherry: [id number]) (Doc ID: [id number])                  AO:[id number] Franchot Erichsen MD

## 2011-02-16 NOTE — Consults (Signed)
Account Number: [id number]      Document ID: [id number]      Admit Date: 03/25/2009      Service Date: 03/28/2009            Patient Location: [id number]      Patient Type: I            CONSULTING PHYSICIAN: Janace Litten MD            REFERRING PHYSICIAN: Alease Medina MD                  CONSULTING SERVICE:      Cardiology.            HISTORY OF PRESENT ILLNESS:      She is a 77 year old patient admitted with fever, chills, rigor, relatively      recent onset.  She had some nonspecific EKG changes and low-grade cardiac      biomarker elevation.            HISTORY OF PRESENT ILLNESS:      This is a new onset of chills, fever, rigor, and cough.  On Thanksgiving      Day, the patient was visiting relatives in [place].  She returned to this      area and was again very shaky and was brought to the emergency room for      further evaluation.  She was taken from her home at [facility]      independent assisted living facility.  According to the EMS notes, blood      pressure was 112/78, heart rate was 82, respirations were regular, and O2      saturation was 100% on room air.  She was oriented to person, place and      time, according to the EMS records.  In the emergency room, white count was      11,070; hemoglobin 9.6; hematocrit 29.5.  BUN 26 with a creatinine of 1.02,      peak troponin I level 0.06 liters, 0.11, then 0.06.  BNP 350.  Chest x-ray      showed possible patchy infiltrates.  The patient has been placed on      antibiotic coverage and is doing much better.            PAST MEDICAL HISTORY:      Diabetes mellitus, adult onset.  No family history of coronary artery      disease.  Nonsmoker.  She lives independently at the [facility].      Twelve-lead EKG:  Normal sinus rhythm with nonspecific ST changes.      Echocardiogram of March 27, 2009, showed nonspecific ST changes.            REVIEW OF SYSTEMS:      GENERAL:  Arthritis in all the joints.      HEENT:  Unremarkable.       CARDIOVASCULAR:  Occasional dyspnea on exertion, no hypertension, no      orthopnea, or ankle edema.      RESPIRATORY:  No difficulty breathing, CVIR.      GASTROINTESTINAL :  Nontender, bowel sounds normal, no organomegaly.      NEURO: No neurological complaints            LABORATORY AND DIAGNOSTIC DATA:      Chest x-ray shows a mild increase in cardiac biomarker levels.  Again, no      evidence of ongoing ischemia.  EKG  in the patient's chart dated March 25, 2009, shows normal sinus rhythm and rate, left axis deviation,      nonspecific lateral changes.  A second EKG done on November 28 shows some      ST and T changes, more pronounced in V1 through V3.            PHYSICAL EXAMINATION:      GENERAL:  The patient is sitting in a chair.  She is completely oriented to      person, place, and time, gave a really detailed history of trip for      Thanksgiving and what lead her to coming into the hospital.      VITAL SIGNS:  Blood pressure 134/59, heart rate 60 and regular.      RESPIRATORY:  Clear.      HEART:  Reveals an S4 gallop.  No S3, murmurs, clicks, or rubs.  No      diastolic murmur audible.      GASTROINTESTINAL:  Soft, nontender, no tenderness.No pulsatile masses      EXTREMITIES:  Good distal perfusion.      NEURO: No focal neurological findings      AFFECT: Appropriate            IMPRESSION:      A small bump in the cardiac biomarkers, I think probably secondary to      hypoxia from the pneumonia.  The patient appears to be in no acute distress      and certainly has no cardiac symptoms.            RECOMMENDATIONS:      My recommendation would be to continue to hold on any further diagnostic      studies, repeat the electrocardiogram at a different time, she whether or      not these nonspecific ST-T changes are new or permanent.  Patient's      medication should remain unchanged.  She is currently on Norvasc 5 mg      daily, furosemide 20 mg daily, glipizide 5 mg b.i.d., magnesium oxide 400       mg daily, K-Dur 20 mEq daily.  We will follow with you.                        Electronic Signing Provider      _______________________________     Date/Time Signed: _____________      Janace Litten MD [phone])            D:  03/28/2009 14:54 PM by Dr. Janace Litten, MD [phone])      T:  03/28/2009 21:13 PM by [id number]          Everlean Cherry: [id number]) (Doc ID: [id number])                  QM:[id number] Thelma Barge MD      Norval Morton MD

## 2011-02-16 NOTE — Discharge Summary (Signed)
Account Number: [id number]      Document ID: [id number]      Admit Date: 05/06/2009      Discharge Date: 05/13/2009            ATTENDING PHYSICIAN:  Blinda Leatherwood, MD                  DISCHARGE DIAGNOSES:      1.  Debility, deconditioning.      2.  History of fever and sepsis syndrome of unclear etiology, possibly from      biliary source versus pneumonia.      3.  Diabetes mellitus type 2.      4.  Hypertension.      5.  Rheumatoid arthritis on chronic prednisone therapy.      6.  Hyperlipidemia.      7.  History of coronary disease.      8.  History of diverticulosis.      9.  History of gastrointestinal bleed.      10.  History of deep venous thrombosis status post inferior vena cava      filter placement.      11.  History of laparoscopic cholecystectomy for gangrenous gallbladder in      2008, complicated by postoperative biliary pancreatitis and bacteremia.      12.  History of bilateral total hip replacements.      13.  History lipoma surgery.            DISCHARGE MEDICATIONS:      1.  Norvasc 5 mg p.o. daily      2.  Aspirin 325 mg p.o. daily      3.  Folate 1 mg p.o. daily,      4.  Lasix 20 mg p.o. daily      5.  Neurontin 600 mg p.o. twice a day      6.  Glipizide 5 mg p.o. twice a day with meals      7.  Hydroxyzine 50 mg p.o. twice a day      8.  Lantus 14 units subcutaneously once a day      9.  Zestril 2.5 mg p.o. b.i.d.      10. Magnesium oxide 400 mg p.o. daily      11. Protonix 40 mg p.o. daily      12. Prednisone 10 mg p.o. daily      13. Sucralfate 1 gram p.o. nightly.            HOSPITAL COURSE:      The patient is a 77 year old very pleasant female, who was admitted on      April 21, 2009, after change in mental status.  She was found to be      afebrile with a temperature of 103 with sepsis.  She was seen by Dr.      Janne Napoleon of general surgery given her history of biliary obstruction and      pancreatitis.  She was also seen by infectious disease, who placed the      patient on IV antibiotics. She  also received IV fluid hydration.  With this      treatment course, the patient's mental status improved, she defervesced and      leukocytosis improved.  It is felt that her fever and leukocytosis may be      related to a biliary source versus a pneumonia.  She did have an elevated      alkaline  phosphatase, which is decreasing in trend.  She is clinically      improved and has overall been asymptomatic off of IV antibiotic after      treatment.  Her IV antibiotics were discontinued on May 06, 2009.  She      was cleared to participate in interdisciplinary therapies at [facility] unit on May 06, 2009.            From a functional standpoint, she is currently independent with all of her      bed mobility, sitting balance, and transfers to bed, mat, and toilet.      She is supervision for car transfers.  She is independent for sit to stand,      wheelchair management, standing balance with upper extremity support, and      with reaching in standing.  She is supervision for standing balance      without upper extremity support.  She is independent with ambulating over      level surfaces 200 feet with a rolling walker, supervision for negotiating      curbs with rolling walker, and contact guard assistance for negotiating      steps. She is independent with negotiating ramps.  She is modified      independent with all of her basic activities of daily living, and modified      independent with simple microwave use for meals.  She receives Meals on      Wheels.            DISCHARGE PHYSICAL EXAMINATION:      VITAL SIGNS:  Temperature is 96.4, pulse 53, respirations 18, blood      pressure 147/70, pulse oximetry is 100%.      GENERAL:  The patient appears stated age.  She is well-developed in no      acute distress.      HEENT:  Head is atraumatic.  Extraocular movements intact.  Moist mucous      membranes.      NECK:  Supple.      CHEST:  S1, S2 with soft systolic murmur of normal  rate.      LUNGS:  Clear to auscultation bilaterally.      ABDOMEN:  Soft, nontender, nondistended.  Bowel sounds are present.      EXTREMITIES:  No significant cyanosis, clubbing, or edema.  No calf      tenderness.      NEUROLOGIC:  The patient is awake, alert, and oriented to self and      situation.  Speech, language, cognition appear to be intact for age.  She      moves all 4 limbs symmetrically with good strength in all segments.  She      follows commands consistently.  She has decreased light touch sensation in      stocking distribution bilateral lower limbs consistent with polyneuropathy.       She has bilateral venous stasis changes in bilateral lower limbs.            DISCHARGE DISPOSITION:      The patient is to be discharged to her assisted living community where she      will receive ongoing home health therapy services including physical      therapy, occupational therapies, and nursing, along with medical team visits      as provided by her assisted living community.  FOLLOWUP:      1.  Follow up with the primary care physician for ongoing medical      management and prescription renewal within 1 to 2 weeks of discharge.                        Electronic Signing Provider      _______________________________     Date/Time Signed: _____________      Blinda Leatherwood MD ([id number])            D:  05/13/2009 10:37 AM by Dr. Blinda Leatherwood, MD ([id number])      T:  05/13/2009 14:57 PM by [id number]          Everlean Cherry: [id number]) (Doc ID: [id number])                        VF:[id number] Wang MD

## 2011-02-16 NOTE — Discharge Summary (Unsigned)
ATTENDING MD:  Alease Medina, MD      ADMITTED:      05/02/2007      DISCHARGED:    05/03/2007            DISCHARGE DIAGNOSES:      1.   Acute pancreatitis.      2.   Biliary obstruction.      3.   Bacteremia.      4.   Rheumatoid arthritis.      5.   Diabetes mellitus.      6.   Coronary artery disease.      7.   Hypertension.            BRIEF HISTORY:  The patient is an 77 year old female with a prior history      of rheumatoid disease who presented to the emergency department complaining      of acute abdominal pain.  Approximately 1 month prior the patient was      admitted to the hospital for what initially appeared to be a GI bleed, but      subsequently developed cholecystitis.  The patient had a cholecystotomy      tube placed, after being seen in surgical consultation by Dr. Derrill Center,      and then was followed by a laparoscopic cholecystectomy.            Her physical examination is as noted in the narrative dictation of Dr.      Derrill Center and myself.            The patient clearly had a presentation of pancreatitis with any amylase      noted to be 1700, lipase 17,200, AST of 658, ALT of 348, alkaline      phosphatase of 842.  She was admitted to the hospital, again after acute      surgical consultation.  She was seen in consultation by the cardiology      service with Dr. Loma Newton because of T-wave inversions in V2 through V6.  It      should also be noted that she underwent an ERCP on 02/05/2007,      demonstrating a large amount of sludge.  ERCP was performed by Dr. Atilano Median.  The patient was noted to have Gram-negative bacteremia and was      treated anticipitatorily with Zosyn.  Steroid coverage was provided because      of the patient's past history of intermittent but protracted oral steroid      use.  Her clinical situation slowly resolved.  She was reintroduced to oral      alimentation by the time of her discharge.            DISPOSITION:  She was discharged to the [facility] for      several weeks of continuing outpatient rehabilitation before hopefully,      returning to her own home.                                    ___________________________________     Date Signed: _______________      Alease Medina, MD                  D 06/10/2007  9:05 A; T 06/11/2007  8:36 A; 1610 - - , [id number], #[id number]  CC:  Alease Medina, MD

## 2011-02-16 NOTE — Progress Notes (Signed)
Nurse - Brief Progress Note            PERMANENT            04/21/2009 05:11                        [facility]            [facility] - CCU                        Ritchey, Tambra Muller.                        Date of Service 04/21/2009 05:11                        HPI/Events of Note Admit to eICU.   04/21/2009                                    eICU Interventions Minor-Other: Medical Records Notification.                                                Electronically Signed by: Kevan Rosebush (RN)

## 2011-02-16 NOTE — Discharge Summary (Signed)
Account Number: [id number]      Document ID: [id number]      Admit Date: 09/10/2008      Discharge Date: 09/21/2008            ATTENDING PHYSICIAN:  Norval Morton, MD                  DISCHARGE DIAGNOSES:      Abdominal pain with spasm of muscle, venous embolism and deep vessels,      rheumatoid arthritis.            BRIEF HISTORY OF PRESENT ILLNESS:      The patient is a 77 year old patient with history of hypertension,      diabetes, rheumatoid arthritis, GI disease, who presented to the hospital      with complaints of abdominal and chest discomfort.  The patient stated that      she initially had more abdominal discomfort that was throughout her      abdomen, but now has more lower thoracic discomfort associated by dyspnea.      The patient was noted to have a dull ache.  She did have a cough productive      of thick sputum.  The patient was admitted to the hospital.            MEDICATION ALLERGY:      BIAXIN, DILAUDID, and IODINE were noted.            MEDICATIONS:      The patient's medicines were noted to be amlodipine, Lovenox for the      above-noted preexistent diagnoses, prednisone 10 mg daily, and self      adjusted dose of insulin.            PHYSICAL EXAMINATION:      As noted in note of Dr. Wilhelmina Mcardle and in the consultation of Dr. Tami Ribas of      the cardiology service.            HOSPITAL COURSE:      The patient was placed on a monitored bed.  The patient was noted to have      bilateral femoral thrombus and her enoxaparin was continued.  She had her      diabetic regimen optimized with the addition of coverage and with the use      of oral glipizide.  Ultimately, she was started on glipizide 5 mg p.o. a.c.      breakfast and dinner and a p.m. dose of Lantus insulin.  The patient was      supervised to make sure that she could adequately self-administer the dose      of insulin, despite her rheumatoid disease.  On May 21, after documentation      of this occlusive thrombus, it was decided that the  patient would be      benefited both by the continuance of systemic anticoagulation and the      placement of an IVC filter.  This was undertaken by invasive radiology on      May 21 and was successfully completed without complication.            DISPOSITION:      The patient was discharged on the above-noted medication regimen and was to      be seen in my office in followup during the week following her discharge.  Electronic Signing Provider      _______________________________     Date/Time Signed: _____________      Alease Medina MD [phone])            D:  11/25/2008 16:55 PM by Dr. Alease Medina, MD [phone])      T:  11/26/2008 10:28 AM by [id number]          Everlean Cherry: [id number]) (Doc ID: [id number])                        ZY:[id number] Chestine Spore MD

## 2011-02-16 NOTE — Consults (Signed)
Account Number: [id number]      Document ID: [id number]      Admit Date: 04/21/2009      Service Date: 04/22/2009            Patient Location: [id number]      Patient Type: I            CONSULTING PHYSICIAN:            REFERRING PHYSICIAN: Alease Medina MD                  CONSULTATION SERVICE:      ID.            REASON FOR CONSULTATION:      Dr. Chestine Spore has requested assistance in management of this patient with      fever.            HISTORY OF PRESENT ILLNESS:      This is a 77 year old female with a history of diabetes, hypertension,      rheumatoid arthritis on chronic prednisone therapy, history coronary artery      disease, hyperlipidemia, history of diverticulosis and gastrointestinal      bleed, history of DVT, and IVC filter.            The patient was found by EMS after they were called for a lockout by      concerned neighbors.  They found the patient with a blood sugar of 60 and      was noted to be more confused than usual.  There was also report of nausea      and some dizziness.  Her chem stick was 60.  She was seen in the emergency      room, had a fever of 103 degrees.  There was also report of weakness and      lethargy.  She was found to have a leukocytosis, elevated liver enzymes.      She was started on Levaquin.  Infectious disease has been requested to make      further recommendations.            The patient is currently responsive, answers most questions regarding      orientation accurately, but is somewhat confused.            She denies any abdominal pain, rigors, cough, shortness of breath,      headache, diarrhea.            Of note, in late November, on November 26, the patient was seen here      apparently for fever and weakness, was found to have a Klebsiella grow in 1      out of 2 blood cultures.  Her urine culture was negative.  She also had      elevated liver enzymes and she had an abdominal ultrasound, which was      nondiagnostic.  The details of that hospitalization are not  forthcoming,      and she was managed by medicine.            PAST MEDICAL HISTORY:      As noted above.            PAST SURGICAL HISTORY:      Includes a history of a laparoscopic cholecystectomy for gangrenous      gallbladder in 2008, which was complicated by postoperative biliary      pancreatitis and bacteremia.  She has also had bilateral total  knee      replacements, the right in 2004, the left in 2005, as well as lipoma      surgery.            ALLERGIES:      The patient has an allergy to [facility] for which she gets shortness of      breath.  Azithromycin is tolerable.            CURRENT MEDICATIONS:      Current antibiotics include Levaquin.  She also has a history of chronic      prednisone use.            SOCIAL HISTORY:      The patient is not an active tobacco and/or alcohol user.            FAMILY HISTORY:      Noncontributory.            PHYSICAL EXAMINATION:      GENERAL:  The patient is elderly-appearing.  She is in no obvious distress.      VITAL SIGNS:  Her T-max of 103.9 degrees rectally.  Heart rate of 56, blood      pressure of 110/51, respiratory rate 20.  T-current of 97.3 degrees.      HEENT:  Revealed anicteric sclerae.  Oral cavity, oropharynx, poor      dentition.  She had no obvious thrush.  She had dry mucous membranes.      NECK:  Supple, without any palpable masses and/or lymphadenopathy.      CHEST:  Clear to auscultation in all lung fields. She had a normal      respiratory rate and rhythm.      HEART:  Revealed a slightly bradycardic, S1, S2 with a II/VI precordial      murmur.      ABDOMEN:  Soft and nontender.  There is no rebound.  She had normoactive      bowel sounds.  She had no tenderness in her right upper quadrant and/or her      suprapubic area.      EXTREMITIES:  The patient had venous stasis changes.  She had no      significant edema.      SKIN:  The patient had no obvious skin or soft tissue infections and/or      skin breakdown.      NEUROLOGIC:  The patient was  lethargic and oriented x2.            LABORATORY DATA:      Revealed a white count of 16.7, hematocrit of 29.9, platelets of 156.  BUN      25, creatinine 1.6.  Glucose of 238.  AST of 115, ALT of 136.  Alkaline      phosphatase of 313.  Total bilirubin of 1.6.            Chest x-ray, personally reviewed, revealed bilateral pleural effusions.      Head CT reportedly negative.            Urine culture:  Negative.  Blood culture:  No growth to date.            ASSESSMENT:      The patient has a history of multiple medical problems admitted for fever,      elevated white count, and altered mental status and exacerbation of      previously-elevated liver enzymes.  Her last admission, the patient had  Klebsiella in 1 out 2 blood cultures, of unclear source.  Of note, this was      managed by the medical service.            This admission, the patient's source of her fever is unclear.      Considerations include an influenza-like illness versus recurrence of an      occult blood stream infection, rule out evolving aspiration pneumonia,      which I doubt. Doubt a central nervous system infection.            I agree with general surgery that the patient's liver enzymes are likely      reactive and is doubtful that the patient has a primary biliary source.            RECOMMENDATIONS:      1.  We will check an abdominal ultrasound.      2.  Change Levaquin to Zosyn pending final cultures.      3.  Check influenza antigens including H1N1.      4.  Followup cultures.      5.  Further recommendations depend on the patient's clinical course.            Thank you for allowing me to participate in the patient's care.                        Electronic Signing Provider      _______________________________     Date/Time Signed: _____________      Anice Paganini MD [phone])            D:  04/24/2009 11:14 AM by Dr. Ladell Heads. Johnhenry Tippin, MD [phone])      T:  04/24/2009 17:06 PM by [id number]          Everlean Cherry: [id number]) (Doc ID: [id number])                   MW:[id number] Ramonica Grigg MD      Norval Morton MD

## 2011-02-16 NOTE — Discharge Summary (Signed)
SHONDELL, FABEL      MRN:          [id number]      Account:      [id number]      Document ID:  [id number]                  Admit Date: 11/28/2009      Discharge Date: 11/30/2009            ATTENDING PHYSICIAN:  Worthy Keeler, MD                  HISTORY OF PRESENT ILLNESS:      This is a 77 year old African American female who was admitted through the      emergency room with a chief complaint of right lower leg pain and some      tenderness and swollen.  She has been suffering from the right lower leg      discomfort and swollen since about 3 months, comes and goes.  She had also      history of right-sided hepatic flexure chronic bleeding on about Sep 16, 2008; therefore, she was hospitalized at the [facility]      with a diagnosis of acute gastrointestinal bleeding and with anemia.  At      that time, she was treated with 2 units of packed cells transfused and she      was seen by Dr. Dalbert Mayotte and Dr. Brantley Persons are gastroenterologists. Also, she      had a followup by Dr. Dalbert Mayotte as outpatient since discharged home June 2011,      with high risk of recurrent gastrointestinal bleeding; however, Dr. Nelson Chimes      recommended avoid to use anticoagulant therapy at that time because of      recurrent gastrointestinal bleeding.  Also she has had type 2 diabetes      mellitus, hypertension, urinary retention with a neurogenic bladder.  Also, she      had a history of rheumatoid arthritis in both hands and also a history of      coronary artery heart disease with a stable condition      PAST SURGICAL HISTORY:      In the past, she had a history of cholecystectomy with gallstone, and also      inferior vena cava filter placed because of recurrent gastrointestinal      bleeding, with history of deep vein thrombosis.  Also, the patient had a      history of total knee replacement in the past with osteoarthritis.            ALLERGIES:      IODINE CONTRAST, and some questionable allergy to DILAUDID and       CLARITHROMYCIN.            SOCIAL HISTORY:      She quit smoking 50 years ago, but she used to smoke 6 to 7 cigarettes a      day for over 10 years at that time.  She is not a drinker.            FAMILY HISTORY:      Noncontributory.            PHYSICAL EXAMINATION:      VITAL SIGNS:  On admission, temperature 97.3, pulse rate 81 per minute,      respiration 19, blood pressure 152/66 on admission.  GENERAL:  Well-developed.  Right side lower leg discomfort and some slight                                   Page 1 of 4      MILTA, CROSON      MRN:          [id number]      Account:      [id number]      Document ID:  [id number]                  tenderness on the calf area, possible small thrombophlebitis.      HEENT:  Unremarkable.      NECK:  Supple, no neck stiffness.  Thyroid is not palpable.      LUNGS:  Lung sounds, a few rhonchi audible, but no wheezing sounds audible.      HEART:  Regular sinus rhythm, early systolic murmur grade I, probably flow      murmur, with occasional S4 gallop audible, but no S3.  No friction rub      audible.  PMI was slight shift to the left side.      ABDOMEN:  Soft, no tenderness to palpation, no hepatosplenomegaly, no mass      palpable, no hernia.      EXTREMITIES:  Left side, no edema, but right side calf area has slight      tenderness with no tenderness to palpation with small thrombophlebitis.      NEUROLOGIC:  Unremarkable.            LABORATORY DATA:      Initial CBC:  WBC count 7830; hemoglobin 9.9; hematocrit 26.9; platelet      count 232,000.  Sodium 140, potassium 4.4, chloride 101, CO2 27, BUN was      23, creatinine 1.0, blood sugar 192, calcium 8.5.  Liver function test was      normal.  GFR was over 60.  PT time 14.2, and INR 1.1, PTT was 21.            DIAGNOSTIC STUDIES:      CT of abdomen was taken on the day of admission, shows scattered      diverticula in the cecum and hepatitic flexure and descending colon, no CT      evidence of diverticulitis.   Aortic vascular calcification and good      condition of the inferior vena cava filter.  The venous Doppler study also      performed in the emergency room, shows small deep vein thrombosis in the      right popliteal vein that has a chronic appearance.  No evidence of a left      lower extremity DVT.  In the right lower extremity there is a small,      eccentric, echogenic thrombus adjacent to the wall of the vein which was      nonocclusive, and the common femoral vein and visualized deep calf vein in      the right side lower extremity demonstrated normal phasic flow,      compressibility, and augmentation.            IMPRESSION AND DIAGNOSES:      1.  Small deep vein thrombosis in the right popliteal vein that has a      chronic appearance, it is not new, nonocclusive, and superficial  thrombophlebitis in right calf.      2.  Status post inferior vena cava filter placement in May 2010.      3.  History of recurrent gastrointestinal bleeding with diverticulosis in      the past.      4.  Type 2 diabetes mellitus.      5.  Hypertension.      6.  Urinary retention with a neurogenic bladder.  The patient was seen by      urologist in the past.      7.  History of deep vein thrombosis in the past.      8.  Hyperlipidemia.                                   Page 2 of 4      ZANOBIA, GRIEBEL      MRN:          [id number]      Account:      [id number]      Document ID:  [id number]                  9.  History of rheumatoid arthritis.      10.  History of coronary artery heart disease, which is stable at this      time.            HOSPITAL COURSE:      This patient was admitted to the medical floor with a diagnosis of possible      deep vein thrombosis in right calf area.  The patient was placed on bed      rest with elevation of right leg, and the patient was treated with some      warm compress on the right lower leg.  A consultation was obtained with Dr.      Osa Craver, pulmonary specialist, and the patient also  was discussed with Dr.      Alfonse Alpers and her primary physician.  The patient and Dr. Alfonse Alpers was fully      informed, also discussed it Dr. Nelson Chimes, gastroenterologist.  After careful      evaluation, Dr. Nelson Chimes recommended to not use anticoagulant therapy with      the history of recurrent gastrointestinal bleeding with a small deep vein      thrombosis in the right popliteal vein that has chronic appearance.  This      is not new, which was nonocclusive..  Also, the patient has had inferior      vena cava filter placed since 1 year because the patient has had recurrent      gastrointestinal bleeding.            The patient also was discussed with Dr. Osa Craver, pulmonary specialist, and      after careful evaluation, suggests that there is no indication for      anticoagulant therapy at this time because of small deep vein thrombosis in      popliteal region but which showed chronic appearance and with recurrent      gastrointestinal bleeding.  The patient was recommended the patient needs      warm compress on right lower leg and keep leg elevation until the leg      swelling down, and increase activity with fall precaution, some physical      therapy evaluation and treatment with antihypertensive medication.  However, the patient was treated with the above-mentioned; on this regimen,      the patient was markedly improved, the leg swelling down, the tenderness      resolved on right calf area.  Therefore, the patient was discussed with Dr.      Nelson Chimes, gastroenterologist, and Dr. Osa Craver.  The patient can be discharged      home without anticoagulant therapy; it is not indicated at this time.            Therefore, the patient discharged home today with good condition with      stable vital signs.  Temperature 97.6, pulse rate normal sinus rhythm, 75      per minute, respiration 21, blood pressure 142/78, pulse oximetry was 98%      with room air, blood sugar was 148,111, which is stable.            The patient was  discharged with instructions with:      1.  An 1800-calorie diabetic diet and a cardiac soft diet.      2.  The patient needs followup care by Dr. Nelson Chimes, gastroenterologist, in 2      weeks in his office.      3.  Also, the patient needs home more warm compresses on the right lower      leg popliteal area with keep leg elevation.      4.  Also, followup care by Dr. Osa Craver as outpatient in 2 to 3 weeks for                                   Page 3 of 4      MEISHA, SALONE      MRN:          [id number]      Account:      [id number]      Document ID:  [id number]                  further evaluation and treatment.      5.  Also, the patient recommended to see Dr. Tamala Bari, hematologist, because      she has had a chronic anemia with a history of gastrointestinal bleeding.      So, the patient fully understood and it was recommended.      6.  On the day of discharge, the patient was in stable condition, and no      complaining.                                    D:  11/30/2009 15:18 PM by Dr. Mel Almond, MD [phone])      T:  11/30/2009 16:23 PM by [id number]                        cc:                                   Page 4 of 4      Authenticated and Edited by Mel Almond, MD On 12/05/09 12:40:23 PM

## 2011-02-16 NOTE — Consults (Signed)
[name], [name]      MRN:          [id number]      Account:      [id number]      Document ID:  [id number]      Service Date: 03/10/2010            Admit Date: 03/08/2010            Patient Location: [id number]      Patient Type: I            CONSULTING PHYSICIAN: Royalty Domagala Alyson Locket MD            REFERRING PHYSICIAN: Mel Almond MD                  CONSULTING SERVICE:      Cardiology.            CHIEF COMPLAINT:      The patient is seen in consultation at the request of Dr. Jaynie Collins for cardiac      evaluation of shortness of breath.            HISTORY OF PRESENT ILLNESS:      The patient is a 77 year old woman with a history of hypertension and      diastolic dysfunction.  She presented to the hospital with generalized      weakness, knee pain, and neck pain.  Evaluation shows pleural effusions.      She reports shortness of breath on admission, and chest x-ray, March 08, 2010, showed slight prominence of the pulmonary vascularity.  She was      placed on p.o. Lasix.  This afternoon, she reports /"feeling better/".  There      is no chest pain, dizziness, or palpitations.            PAST MEDICAL HISTORY:      Hypertension.  Echocardiogram in December 2010 showed abnormal relaxation      of the left ventricle with normal ejection fraction and mild aortic      stenosis.  She had a nuclear study in May 2010, which showed no ischemia.      She has rheumatoid arthritis and a history of DVT and is status post IVC      filter.  She also has diabetes and reported history of hyperlipidemia.  She      has a reported history of coronary artery disease, but I have not been able      to verify this.            ALLERGIES:      IODINE, DILAUDID, CLARITHROMYCIN, and AZITHROMYCIN.            CURRENT INPATIENT MEDICATIONS:      Include amlodipine, ascorbic acid, aspirin, calcium carbonate, ceftriaxone,      cyanocobalamin, folic acid, Lasix 20 mg p.o. daily, glipizide, Neurontin,      hydroxyzine, insulin, magnesium, pantoprazole,  prednisone, and simvastatin      20 mg daily, sucralfate, and vancomycin.            SOCIAL HISTORY:                                   Page 1 of 3      [name]      MRN:          [id number]  Account:      [id number]      Document ID:  [id number]      Service Date: 03/10/2010            She lives alone.  She does not smoke.  She does not drink alcohol.            FAMILY HISTORY:      Father died at the age of 62, possibly from coronary artery disease.            REVIEW OF SYSTEMS:      GENERAL:  Positive fatigue.      NEUROLOGIC:  Positive headaches.      EYES:  Negative acute visual changes.      ENMT:  Negative nasal bleed.  Negative oral bleed.      PULMONARY:  Positive shortness of breath.      CARDIOVASCULAR:  Negative chest pain, negative palpitation.      GASTROINTESTINAL:  Negative abdominal pain, negative GI bleed.      GENITOURINARY:  Negative hematuria.      MUSCULOSKELETAL:  Positive joint pain.      SKIN:  Negative rash.            PHYSICAL EXAMINATION:      VITAL SIGNS:  Blood pressure 167/70, heart rate 78, respirations of 18,      temperature 99.5.      GENERAL:  Weak and frail-appearing woman in no distress.      EYES:  Anicteric.      ENMT:  Moist oral mucosa.      NECK:  Negative JVD.  There is a left carotid bruit.      LUNGS:  Clear, respirations unlabored.      HEART:  Regular rhythm with a II/VI crescendo decrescendo murmur.  There is      no S3.  PMI is normal.      ABDOMEN:  Soft.  There is no hepatosplenomegaly.  There is mild tenderness.       There is no guarding.  There is no rebound.  Abdominal aorta not palpable.       Femoral and posterior tibial pulses are 2+ bilaterally.      EXTREMITIES:  Lower extremities show no edema.  Upper extremities:  No      clubbing of the digits.      SKIN:  Warm and dry.      NEUROLOGIC:  The patient is alert and oriented x3.      PSYCHIATRIC:  Mood and affect are appropriate.      MUSCULOSKELETAL:  Normal tone.            DIAGNOSTIC DATA:       Independent review of the EKG from March 08, 2010 at 4:40 p.m. shows      normal sinus rhythm, normal QRS axis, poor R wave progression, nonspecific      T-wave changes.            LABORATORY DATA:      White count 9.2, hematocrit 25.3, platelets 258.  Potassium 3.8, BUN of 14,      creatinine 0.9, magnesium 1.2.  BNP 204.                                   Page 2 of 3      [name]      MRN:  [id number]      Account:      [id number]      Document ID:  [id number]      Service Date: 03/10/2010                  DIAGNOSES:      1.  Pleural effusion.      2.  Diastolic heart failure.      3.  Diabetes.      4.  Hypertension.      5.  Mild aortic stenosis.            COMMENT:      The patient is a 77 year old woman who presents with generalized weakness,      knee pain, and pain.  She has shortness of breath and workup shows pleural      effusion.  She does have mild diastolic heart failure.  I recommend an      additional dose of intravenous Lasix today.  I recommend changing Zocor to      pravastatin because she is on Norvasc.  Magnesium will be repleted.            Thank you very much for the opportunity to participate in the care of this      patient, and our team will continue to follow her with you.                        Electronic Signing Provider            D:  03/10/2010 16:15 PM by Dr. Marni Griffon, MD [phone])      T:  03/10/2010 18:02 PM by [id number]                  cc:                                   Page 3 of 3      Authenticated by Marni Griffon, MD [phone]) On 03/17/2010 10:12:31 PM

## 2011-02-16 NOTE — Consults (Signed)
[name], [name]      MRN:          [id number]      Account:      [id number]      Document ID:  [id number]      Service Date: 03/11/2010            Admit Date: 03/08/2010            Patient Location: [id number]      Patient Type: I            CONSULTING PHYSICIAN: Alonna Buckler MD            REFERRING PHYSICIAN: Mel Almond MD                  REASON FOR CONSULTATION:      Chronic anemia.            HISTORY OF PRESENT ILLNESS:      I was given the pleasure of seeing and evaluating this patient who has had      anemia dating back to at least 1995.            She underwent a colonoscopy by Dr. Nelson Chimes in late April 2011 who found      maroon-colored blood and stool throughout the entire colon with bright red      blood consistent with significant GI bleed.  Epinephrine was injected in      one of her diverticulum which had a clot and she required blood      transfusions.  She also suffers from chronic rheumatoid arthritis.  Review      of her laboratory work shows that she has had chronic anemia dating back to      at least 1995 when her hemoglobin was 10.8.            She lives in [facility] with assistance and presented to the      emergency room with shortness of breath, tiredness, and fatigue and was      found to have congestive heart failure for which cardiology was consulted      and management initiated.  At the bedside, she appears more comfortable      with no headache or change in vision.  No abdominal pain or shortness of      breath.  She is very frail, but her appetite is reasonable and she has not      had any significant recent weight loss and no palpitations.            REVIEW OF SYSTEMS:      Obtained in detail and is negative except as outlined above.            PAST MEDICAL HISTORY:      Chronic anemia for at least 16 years dating back to 1995, recent      significant GI bleed requiring transfusions in April 2011, longstanding      rheumatoid arthritis, hypertension, remote history of peptic  ulcer, history      of gangrenous cholecystitis and sepsis requiring ERCP, diabetes,      hyperlipidemia, bilateral DVTs in 2010, status post IVC filter placement      given contraindications for chronic anticoagulation to include age and GI      bleed back then, cataract surgery, right total hip arthroplasty, left      femoral neck fracture, laparoscopic cholecystectomy.  Page 1 of 3      [name], [name]      MRN:          [id number]      Account:      [id number]      Document ID:  [id number]      Service Date: 03/11/2010                  SOCIAL HISTORY:      She is widowed since 1949, does not have any children.  She has a niece in      [place] and her granddaughter is at her bedside.  She lives at [facility] in assisted living, ambulates with a walker.  No      alcohol.  Quit smoking 60 years ago.            FAMILY HISTORY:      Her sister died at age 57 secondary to peripheral vascular disease      complication, and younger sister died of complications of Alzheimer      disease.            ALLERGIES:      DILAUDID, CLARITHROMYCIN, IODINE, AZITHROMYCIN.            MEDICATIONS:      Norvasc, vitamin C, baby aspirin, Rocephin, vitamin B12, folic acid, Lasix,      Neurontin, Glucotrol, hydrocortisone IV, hydroxyzine, insulin, Lantus,      Protonix, Carafate, vancomycin, hydralazine.            PHYSICAL EXAMINATION:      VITAL SIGNS:  Pulse rate 72 and regular, respiratory rate 18, she is      afebrile, blood pressure 150/63.      HEENT:  EOMI, PERRLA appears very frail.  Mouth:  No ulceration.      NECK:  Supple, no JVD, no thyromegaly, no lymphadenopathy.      CHEST:  Clear to auscultation bilaterally.      CARDIAC:  S1, S2, regular rhythm and rate, positive II/VI systolic murmur.      ABDOMEN:  Nontender, nondistended, no hepatosplenomegaly.  Bowel sounds are      present.      EXTREMITIES:  No edema, cyanosis, clubbing.      SKIN:  No rash.       NEUROLOGIC:  Alert, oriented x3 and exam is nonfocal.            LABORATORY DATA:      CBC and complete metabolic profile was reviewed.            ASSESSMENT AND PLAN:      1.  Chronic anemia dating back to at least 1995 with recent worsening,  Appears      to be multifactorial secondary to anemia of chronic disease from her rheumatoid      arthritis and recent gastrointestinal bleed with possibility of myelodysplastic      syndrome.      2.  History of deep venous thrombosis status post inferior vena cava filter      placement and recent history of significant gastrointestinal bleed from      what appears to have been diverticular origin.      3.  Advanced age and cardiac comorbidities.  Page 2 of 3      [name], [name]      MRN:          [id number]      Account:      [id number]      Document ID:  [id number]      Service Date: 03/11/2010                  RECOMMENDATIONS:      1.  Iron profile, ferritin, B12, folic acid, reticulocyte count,      erythropoietin level.      2.  Guaiac stool and consideration for gastrointestinal evaluation as      clinically appropriate.      3.  Judicious and cautious blood transfusion if her hemoglobin falls less      than 8 (she had recent heart failure and one needs to be cautious for      volume overload).      4.  Consideration for intravenous iron therapy if iron depletion confirmed.      5.  Follow up with me 2 to 3 weeks after hospital discharge to discuss      about a possible bone marrow biopsy.            Over detailed counseling, I told her that her anemia is multifactorial as      discussed above.  The problem dates back to at least 15 years ago when she      had a hemoglobin in the 10's and, given her age, she could certainly have a      third problem contributing to her anemia being underlying myelodysplastic      syndrome.  Given her age, comorbidities, and very full frail general      status, she is not a candidate for  chemotherapy if myelodysplastic syndrome      confirmed.  Furthermore, supportive care with Procrit may have its own      risks given her history of clots and hypertension.  Having said that, I      will be more than happy to rediscuss about the role of bone marrow biopsy      which appears to have more of a diagnostic value at this point in time.            Once again, thank you very much for allowing me to participate in her care      with you.            Addendum: I learned that she had been evaluated by Dr. Brent Bulla and Mr. Caleb Popp      from [facility] . I discussed with Dr. Jaynie Collins that we will      be more than happy to follow her up while at the hospital and she will then      have a close follow up by Dr. Brent Bulla and Mr. Caleb Popp from [facility]iative      care as needed. .                  Electronic Signing Provider            D:  03/11/2010 11:09 AM by Dr. Alonna Buckler, MD ([id number])      T:  03/11/2010 16:24 PM by [id number]                  cc:  Page 3 of 3      Authenticated and Edited by Alonna Buckler, MD ([id number]) On 03/14/10 1:37:10 PM

## 2011-02-16 NOTE — Consults (Signed)
Account Number: [id number]      Document ID: [id number]      Admit Date: 09/10/2008      Service Date: 09/11/2008            Patient Location: [id number]      Patient Type: I            CONSULTING PHYSICIAN: Leory Plowman, MD            REFERRING PHYSICIAN:                  CONSULTING SERVICE:      Gastroenterology.            PRIMARY PHYSICIAN:      Dr. Norval Morton.            REASON FOR CONSULTATION:      Epigastric/chest pain.            HISTORY OF PRESENT ILLNESS:      The patient is a pleasant 77 year old woman well known to me, who I had      seen fairly recently in the hospital.  She has abdominal pain that is felt      to be related to spasm.  She was hospitalized on this occasion for a      different pain.  It is pain that she has had before, but it was much worse      than usual.  It was in the subxiphoid area to higher up in the chest.  It      was sharp.  It was severe.  It frightened her.  If she took a deep breath      in and out, she felt a little worse, but she really does not describe      abdominal pain at this point.  No nausea, vomiting.  No black stools.  She      has other problems.  She has had pancreatic problems.  She has had      diabetes, coronary artery disease, rheumatoid arthritis.  She has had lower      GI bleeding in the past.            PHYSICAL EXAMINATION:      GENERAL:  She today looks reasonably well, not acutely ill.      VITAL SIGNS:  Unremarkable.      ABDOMEN:  Unremarkable.  Really without organomegaly, tenderness, or      discomfort.  She points to the retrosternal area.  The rest of the exam is      benign.            LABORATORY DATA:      Basically unremarkable.  Hemoglobin 10, 9.3 with hydration.  This is normal      for her.  Electrolytes normal.  Her alkaline phosphatase is elevated at      251.  AST was 41, then increased to 115 today.  Troponins have been within      normal limits.            IMPRESSION:      A woman with retrosternal pain for reasons that are  unclear.  I do not      think this is primarily gastrointestinal.  I agree with giving her a proton      pump inhibitor and would also suggest a cytoprotective agent such as      sucralfate.  I will write for this.  Her CAT scan showed  normal liver,      stool throughout the colon, lots of diverticula.  There was no mention of      her gallbladder.  It is not clear if she has had a cholecystectomy in the      past.  If not, she will need an abdominal sonogram and a stimulated HIDA      scan with gallbladder ejection fraction for the sake of completeness.            Thank you for allowing me to share in her care.  I will follow with you.                              _______________________________     Date/Time Signed: _____________      Leory Plowman, MD [phone])            D:  09/11/2008 11:20 AM by Ellender Hose, Montez Hageman, MD [phone])      T:  09/11/2008 17:50 PM by [id number]          Everlean Cherry: [id number]) (Doc ID: [id number])                  JO:ACZYSAYT Rubye Beach, MD

## 2011-02-16 NOTE — Discharge Summary (Signed)
[name], PELLE      MRN:          [id number]      Account:      [id number]      Document ID:  [id number]                  Admit Date: 08/18/2009      Discharge Date: 08/29/2009            ATTENDING PHYSICIAN:  Worthy Keeler, MD                  HISTORY OF PRESENT ILLNESS:      This is a 77 year old African American female who was admitted through the      emergency room with chief complaint of rectal bleeding with general body      weakness, dizziness, and accompanied by abdominal pain.  She was fine until      early morning on the day of admission when she developed general body      weakness, dizziness, and she went to restroom in the morning to have bowel      moment and she was noticed the rectal bleeding with lot of bleeding in      toilet; therefore, she was brought to this emergency room.            PAST MEDICAL HISTORY:      She has had a number of medical problems including rheumatoid arthritis      20-year history and type 2 diabetes mellitus 15 years, hypertension, and      hyperlipidemia.  Also, patient had history of coronary artery heart      disease, which was stable, and diverticulosis.  Also, she had history of GI      bleeding with peptic ulcer disease in the past, and also she had a history      of deep vein thrombosis; therefore, inferior vena cava filter placed in the      past with GI bleeding.  Also, patient had history of cholecystectomy with      gallstone and with the pancreatitis.  History of bilateral hip joint      replacement was performed in the past.  Also, history of removed under the      skin lipoma, but she denied history of cerebrovascular accident, any      malignant disease, and acute myocardial infarction.            ALLERGIES:      No known allergies except for ALLERGY to IODINE CONTRAST.            SOCIAL HISTORY:      She quit smoking over 50 years ago.  She used to smoke 6 to 7 cigarettes      daily for about 10 years' duration.  She is not a drinker.            FAMILY  HISTORY:      Noncontributory.            PHYSICAL EXAMINATION:      VITAL SIGNS:  Temperature 97, pulse rate sinus rhythm, 60 per minute,      respirations 18, blood pressure on admission 170/76, pulse oximetry 99%      with room air.      GENERAL:  Well-developed, well-nourished, but some anemic, pale.      HEENT:  Unremarkable.      NECK:  _____ not palpable.  Page 1 of 4      [name], [name]      MRN:          [id number]      Account:      [id number]      Document ID:  [id number]                  LUNGS:  Sounds few rhonchi audible, but otherwise clear.      HEART:  Regular, grade I, early systolic murmur audible along left sternal      border.  No gallop, normal S1, S2, no friction rub audible.  PMI was slight      shift to the left.      ABDOMEN:  Mid abdomen slight tenderness to palpation, no      hepatosplenomegaly, no mass palpable, no hernia, no rebound tenderness.      EXTREMITIES:  Left arm was notable limited range of motion with arthritis.      Lower legs, no edema, negative Homan sign.            LABORATORY AND DIAGNOSTIC DATA:      Initial CBC:  WBC count 9,060; hemoglobin 8.1; hematocrit 25.8; platelet      count 206,000; sodium 138, potassium 4.1, chloride 104, CO2 was 20, BUN was      26, creatinine 1.0, blood sugar 231, calcium 8.5, total bilirubin was 0.3,      ALK 184 which was elevated, ALT 32, AST 22, CK enzyme was 101, troponin I      was less than 0.01, PT  time 14, INR 1.2, PTT 28.  Stool for occult blood      was positive.            Abdominal x-ray showed moderate constipation; otherwise, no evidence of      obstruction and no evidence of perforation.  Chest x-ray was unremarkable.      Bleeding scan was performed and showed hepatic flexure colonic active      bleeding.            Repeat hemoglobin and hematocrit 10.9 and 33 0.2, repeat troponin I      performed 2 more times less than 0.01, which was normal.  CK enzyme was      normal, hemoglobin A1c  8.7, total cholesterol 170, triglyceride 132, HDL      38, LDL 80.            Chest x-ray was unremarkable.  EKG showed sinus bradycardia, probably      anterior septal wall injury, but age undetermined.            Colonoscopy was performed by Dr. Nelson Chimes, gastroenterologist, that showed      diverticulosis was noted in the sigmoid colon.  There was 1 clot that was      inside of  large diverticulum.            Urinalysis showed negative.  Urine culture no growth.  MRSA was done 2      times and was negative.            IMPRESSION:      1.  Acute lower gastrointestinal bleeding with hepatic flexure bleeding.      2.  History of coronary artery heart disease with stable condition.      3.  Type 2 diabetes mellitus.      4.  Rheumatoid arthritis.      5.  Hypertension.      6.  Urinary retention, neurogenic bladder, which was stable.      7.  Diverticulosis.                                   Page 2 of 4      [name], [name]      MRN:          [id number]      Account:      [id number]      Document ID:  [id number]                  8.  History of deep vein thrombosis with inferior vena cava filter placed.      9.  History of bilateral hip replacement.      10.  History of hyperlipidemia.            DISCHARGE MEDICATIONS:      1.  Vitamin C 1000 mg p.o. daily.      2.  Calcium carbonate 500 mg p.o. daily.      3.  Vitamin B12 500 mcg p.o. daily.      4.  Folate 1 mg p.o. daily.      5.  Neurontin 600 mg p.o. q.12 h.      6.  Glucotrol 5 mg p.o. twice a day.      7.      8.  Magnesium oxide 400 mg p.o. daily.      9.  Protonix 40 mg p.o. daily.      10.  Carafate 1 gram p.o. at bedtime.      11.  NovoLog insulin coverage with sliding scale as she needed.            RECOMMENDATIONS:      1.  This patient needs home physical therapy and occupational therapy with      fall precaution.      2.  Home nurse care.  Visiting nurse was arranged by case management,      social worker Miss Melissa and home aide arranged, also, with  fall      precaution.      3.  Diet:  An 1800-calorie diabetic and mechanical cardiac soft diet.      4.  Also, the patient was discussed with physical therapist.  The patient      is safe to be discharged home for outpatient physical therapy, occupational      therapy evaluation and treatment, home care visiting nurse arranged.            Also, the patient was discussed with Dr. Dalbert Mayotte and general surgeon, Dr.      Allyne Gee.  This patient is in stable condition.  The patient can be      discharged home for outpatient followup care, so patient was fully informed      and also the patient was discussed with the niece was fully informed and      advised for followup care by Dr. Dalbert Mayotte as outpatient.            Again, the patient needs 1800-calorie diabetic and mechanical soft cardiac      diet and also fall precaution with home physical therapy and occupational      therapy with 24 hours of observation is strongly recommended to patient and      niece.  On the day of discharge, the patient received evaluation by physical      therapist, Judeth Cornfield, and after careful evaluation, the patient can be      discharged home for outpatient followup care by the home physical therapy      and occupational therapy evaluation and home aide, home visiting nurse; RN      was arranged.                                         Page 3 of 4      MINAHIL, QUINLIVAN      MRN:          [id number]      Account:      [id number]      Document ID:  [id number]                  CONDITION ON DISCHARGE:      The patient was in stable condition with vital signs:  Temperature 97,      pulse rate 77 per minute, respirations 18, blood pressure 131/50.  Blood      sugar stable at 132, 148, 182.  Repeat CBC showed white blood cell count      6,450; hemoglobin 10.4; hematocrit 32.5; platelet count 286,000.  So, the      patient was discharged with a stable condition with followup appointment      made with Dr. Dalbert Mayotte and also general surgeon, Dr.  Allyne Gee, as she needed.      On the day of discharge, the patient was in stable condition.                                    D:  08/29/2009 18:50 PM by Dr. Mel Almond, MD [phone])      T:  08/30/2009 07:33 AM by [id number]                        cc:     Worthy Keeler MD                                   Page 4 of 4      Authenticated and Edited by Mel Almond, MD On 09/18/09 2:25:14 PM

## 2011-02-16 NOTE — Consults (Signed)
Account Number: [id number]      Document ID: [id number]      Admit Date: 09/10/2008      Service Date: 09/12/2008            Patient Location: [id number]      Patient Type: I            CONSULTING PHYSICIAN: Emeline General MD            REFERRING PHYSICIAN: Doree Albee Silis MD                  CONSULTING SERVICE:      Cardiology.            ATTENDING PHYSICIAN:      Norval Morton, M.D.            REASON FOR CONSULTATION:      Cardiology evaluation of chest pain at the request of Dr. Hoy Finlay.            HISTORY OF PRESENT ILLNESS:      This is a 76 year old woman with history of hypertension, diabetes,      rheumatoid arthritis, and gastrointestinal disease who presented to      the hospital with complaints of abdominal and chest discomfort.  She said      she initially had more abdominal discomfort that was throughout her abdomen      but now is more lower chest discomfort  starting in the midline of her      lower chest and radiating to both sides, it is worse with breathing.  It is      described as a dull ache.  She is not very active, so it is difficult to      judge if there is any exertional component.  She denies any associated      shortness of breath.  She does have a cough productive of some thick      sputum.            ALLERGIES:      BIAXIN, DILAUDID, and IODINE.            MEDICATIONS:      Currently include amlodipine 5 mg daily, Lovenox 40 mg subcutaneous b.i.d.,      prednisone 10 mg daily, and insulin.            SOCIAL HISTORY:      No tobacco.            FAMILY HISTORY:      No premature coronary artery disease.            REVIEW OF SYSTEMS:      As per HPI, otherwise all systems are negative.            PHYSICAL EXAMINATION:      VITAL SIGNS:  Blood pressure is 180/68, heart rate 65, respiratory rate is      16.      GENERAL:  This is a well-appearing elderly woman in no apparent distress.      NECK:  JVP is 7.  There are no carotid bruits.  The oropharynx is clear.      There is mild pallor of  the conjunctivae.      HEART:  PMI is discrete and nondisplaced, regular rate and rhythm, no      murmurs, rubs or gallops.      LUNGS:  Clear to auscultation bilaterally.  Respirations are unlabored.  ABDOMEN:  Soft, nontender, nondistended, normoactive bowel sounds.  No      hepatosplenomegaly.  Abdominal aorta is within normal limits.      EXTREMITIES:  No clubbing, cyanosis or edema.  Peripheral pulses are      symmetric and intact.  Femoral pulses without bruits.      NEUROLOGIC:  Patient is alert and oriented x3.      MUSCULOSKELETAL:  The spine is within normal limits.            LABORATORY AND DIAGNOSTIC DATA:      Troponins are negative x3.  BMP is within normal limits.  CBC is notable      for anemia with H and H of 9.5 and 29.9.  EKG shows sinus bradycardia with      voltage criteria for LVH.  Echocardiogram shows very mild aortic stenosis,      normal LV function.            ASSESSMENT AND PLAN:      This is a 77 year old woman with multiple vascular risk factors who      presents with upper abdominal and lower chest discomfort.  It is not clear      to me the cause of her discomfort.  It does not seem entirely consistent      with a gastrointestinal or musculoskeletal pain.  There is a possibility it      being pulmonary in origin.  She has a VQ scan scheduled.  I  believe that      is reasonable.  Will also perform a Lexiscan thallium stress test to rule      out any myocardial ischemia and for risk stratification.  I will continue      her Norvasc at this time.  Her blood pressure is elevated, but that may be      secondary to pain and we will adjust her medications as an outpatient for      optimal blood pressure control.            Thank you very much for this very interesting consult.                        Electronic Signing Provider      _______________________________     Date/Time Signed: _____________      Emeline General MD ([id number])            D:  09/12/2008 12:35 PM by Dr. Kennith Gain.  Tami Ribas, MD ([id number])      T:  09/12/2008 19:32 PM by [id number]          Everlean Cherry: [id number]) (Doc ID: [id number])                  PP:IRJJO Silis MD      Danny Lawless MD

## 2011-02-16 NOTE — H&P (Signed)
Account Number: [id number]      Document ID: [id number]      Admit Date: 04/21/2009            Patient Location: M612-01      Patient Type: I            ATTENDING PHYSICIAN: Norval Morton, MD                  REASON FOR ADMISSION:      Dyspnea, fever to 103, history of biliary obstruction with pancreatitis,      history of systemic rheumatoid arthritis, history of diabetes, history of      coronary disease, history of hypertension.            BRIEF HISTORY OF PRESENT ILLNESS:      The patient is a 77 year old female with history of rheumatoid disease      presenting to the emergency department after being transferred by ambulance      with fever and generalized weakness.  The patient has a history of      cholecystotomy, laparoscopic cholecystectomy, and the need to place a      biliary stent by Dr. _____, this being performed by ERCP.  The patient was      also admitted with chest pain, shortness of breath, and ruled out at      approximately Thanksgiving of this year.            MEDICATIONS:      Naprosyn, Lasix, prednisone, folic acid, glipizide, potassium.            PHYSICAL EXAMINATION:      VITAL SIGNS:  Her blood pressure is noted to be 168/78, her pulse 88 and      regular, her respiratory rate is 20 and her last office weight is noted to      be 137.      HEENT:  Remarkable for bitemporal wasting.  Her cranial nerves are      symmetric.  Her mucous membranes appear dry.  She appears tachypneic.  Her      neck has full range of motion.      LUNGS:  Remarkable for decreased breath sounds at bilateral bases.      HEART:  Sounds are distant with a physiologically split first and second      heart sound.  She is not tachycardic.      ABDOMEN:  Demonstrates no focal tenderness, no rebound and guarding.      However, she is complaining of persistent nausea.  She has not evidenced      any emesis to this point. EXTREMITIES:  Show classic rheumatoid      deformities.            Her initial troponin was mildly elevated.   For this reason, she was placed      on a monitored bed.  We will check [facility] in light of the      recent comprehensive cardiovascular evaluation.  The patient will receive      hydration.  After appropriate cultures, will receive 1 dose of Levaquin      while culture data is pending.  Other diagnostic studies will be ordered as      indicated.                        Electronic Signing Provider      _______________________________  Date/Time Signed: _____________      Alease Medina MD [phone])            D:  04/22/2009 05:10 AM by Dr. Alease Medina, MD [phone])      T:  04/22/2009 05:51 AM by [email or web address]          Everlean Cherry: [id number]) (Doc ID: [id number])                  RJ:JOACZYSA Chestine Spore MD

## 2011-02-16 NOTE — Discharge Summary (Signed)
KEYOSHA, TIEDT      MRN:          [id number]      Account:      [id number]      Document ID:  [id number]                  Admit Date: 02/13/2010      Discharge Date: 02/17/2010            ATTENDING PHYSICIAN:  Eunice Blase, MD                  ADMITTING AND DISCHARGE DIAGNOSES:      1.  Status post urosepsis, urinary tract infection.      2.  Hypertension, under control.      3.  Diabetes, under control.      4.  Coronary artery disease.      5.  Rheumatoid arthritis with bony deformities.      6.  Hyperlipidemia.      7.  History of deep venous thrombosis, status post inferior vena cava      filter placement.      8.  Generalized deconditioning.      9.  History of bilateral hip replacement, right in 2004, left in 2005.            DISCHARGE MEDICATIONS:      Are as follows:      1.  Glipizide 5 mg p.o. b.i.d.      2.  Aspirin 81 mg p.o. daily.  The patient was taking 325 mg before.      3.  Hydroxyzine pamoate 50 mg p.o. b.i.d.      4.  Magnesium oxide 400 mg p.o. daily.      5.  Gabapentin 600 mg p.o. b.i.d.      6.  Prednisone 5 mg p.o. daily.      7.  Lasix 20 mg p.o. stopped.      8.  Hydralazine changed to 100 mg p.o. q.8 h.      9.  Restoril 50 mg p.o. b.i.d.      10.  Norvasc 10 mg p.o. daily.      11.  Lantus 10 units subcutaneous nightly.      12.  Januvia, new medication, 100 mg p.o. daily.      13.  Zocor 20 mg p.o. daily, also a new medication.            DISPOSITION:      To home.            DISCHARGE CONDITION:      Stable.            DISCHARGE INSTRUCTIONS:      1.  The patient should follow up with primary care physician, Dr. Liborio Nixon       for  underlying medical problems in 2 weeks.      2.  The patient advised on liberal fluid intake due to evidence of moderate      dehydration on admission.                                   Page 1 of 3      LUDDIE, BOGHOSIAN      MRN:          [id number]      Account:      [id number]  Document ID:  [id number]                        HISTORY OF PRESENT  ILLNESS AND HOSPITAL COURSE:      This is a 77 year old female, past medical history significant for      diabetes, rheumatoid arthritis with bony deformities, diverticulitis,      status post bilateral hip replacement, hypertension, hyperlipidemia, poorly      controlled diabetes, presented to the ED on February 13, 2010, [facility], with diarrhea, nausea, and vomiting.  The patient was      evaluated to have a urinary tract infection with evidence of urosepsis but      was clinically stable, hence, admitted to general medical floor for IV      hydration and IV antibiotics.            The patient received Rocephin during this clinical course, clinically      improved with hemodynamic stability.  On admission, the patient had      leukocytosis of 17.68.  Current white cell is now 10.9.  Clinical course      was significant for an episode of _____ hypertension with chest discomfort.       ACLS protocol was initiated, revealed no evidence of myocardial      abnormalities.  BNP that was done was low, below CHF range.  The patient      was managed, medication review for the hypertension.  Hydralazine, that      patient was taking p.r.n., was now put on scheduled medication, 100 mg q.8      h.            Due to the poorly controlled diabetes with a hemoglobin A1c level that came      out to be 8.9, Januvia was added, 100 mg p.o. daily, to the antidiabetic      medication together with antilipid medication of Zocor 20 mg.  The patient      was initially taking aspirin 325 mg.  That has been reduced to 81 mg p.o.      daily.  Lantus, 8 units before, has also been increased to 10 units      subcutaneous.  An additional medication of Norvasc 10 mg has been added to      the antihypertensive regimen for tight hypertensive control.            Marginal normocytic anemia of H and H of 9.9, 30.1, has been discussed, and      the patient has been asked to follow with primary care physician for      monitoring  of the other medical problems.            Time taken for patient education and discharge coordination about 45      minutes.                        Electronic Signing Provider            D:  02/17/2010 10:52 AM by Dr. Gretta Began. Higinio Plan, MD ([id number])      T:  02/17/2010 16:39 PM by [email or web address]  Page 2 of 3      SARITA, HAKANSON      MRN:          [id number]      Account:      [id number]      Document ID:  [id number]                  cc:                                   Page 3 of 3      Authenticated and Edited by Gretta Began. Higinio Plan, MD ([id number]) On 02/22/10 10:00:42 AM

## 2011-02-16 NOTE — Consults (Signed)
Account Number: [id number]      Document ID: [id number]      Admit Date: 04/21/2009      Service Date: 04/22/2009            Patient Location: G387-56      Patient Type: I            CONSULTING PHYSICIAN: Francee Nodal MD            REFERRING PHYSICIAN: Alease Medina MD                  TITLE OF CONSULTATION:      General Surgery.            HISTORY OF PRESENT ILLNESS:      A 77 year old female admitted through the emergency department with a      history of a temperature 103, hypoglycemia and noticed to have decreased      activity level with sleeping for about 36 continuous hours per report.      Initial evaluation showed no specific findings and she was admitted for      observation.  I was being asked to evaluate her for possibility of biliary      sepsis due to retained stent.            This patient is known to our service from a prior cholecystectomy back in      2008.  Just before that, she had required a cholecystostomy tube for      sepsis.  Apparently, the lady had a persistent elevation of LFTs and pain      necessitating an ERCP in October 2008, by Dr. Marin Comment.  However, the      leak was found. The sphincter was cut, but no stents were placed.  In May      2001, she apparently was admitted with bilateral femoral DVT and had an IVC      filter placed.            Currently, laboratory values showed a white blood cell count of 16.7, up      from 12.1 on admission, hematocrit is approximately 29, platelet count is      in the mid 150s.  At the time of admission yesterday, her bilirubin was      1.0, it is now 1.6.  Alkaline phosphatase was 291, now up to 313.  ALT has      dropped from 191 to 115 and AST has dropped from 310 to 136.  BUN was 25,      creatinine of 1.6.            PHYSICAL EXAMINATION:      GENERAL:  She is awake, pleasant, oriented.      HEENT:  Sclerae clear.  No distress.  Breathing is unlabored.      HEART RATE:  Regular.      ABDOMEN:  Completely soft and benign.       GENITOURINARY:  She has a Foley in place that is draining light, clear      urine.  It is certainly not icteric.      VITAL SIGNS:  Stable.  She is afebrile.  Pulse is in the 70s.            IMPRESSION:      Based on the recent history and old history, I do not see any reason to      suspect biliary sepsis as  a primary etiology here. I think what we are      seeing is a mild cholestatic picture related to other sources such as      dehydration and infection.  I suspect that it is going to clear fairly      quickly with antibiotics and rehydration.            PLAN:      Since her abdomen is completely benign on examination and there is no      significant trend on her liver function tests to suggest significant      obstruction, I think there is no reason to perform any further imaging      studies like HIDA scan, ultrasound, etc.  I will hold off on those unless      the situation changes.  Similarly, I think it is okay to feed her if she is      alert.  We will continue to follow her here in the hospital, but this time,      I see no acute intraabdominal pathology.                        Electronic Signing Provider      _______________________________     Date/Time Signed: _____________      Francee Nodal MD ([id number])            D:  04/22/2009 16:34 PM by Dr. Francee Nodal, MD ([id number])      T:  04/22/2009 17:20 PM by [email or web address]          Everlean Cherry: [id number]) (Doc ID: [id number])                  MV:HQIONG Janne Napoleon MD      Norval Morton MD

## 2011-02-16 NOTE — Consults (Signed)
[name], LASCH      MRN:          [id number]      Account:      [id number]      Document ID:  [id number]      Service Date: 11/29/2009            Admit Date: 11/28/2009            Patient Location: [id number]      Patient Type: I            CONSULTING PHYSICIAN: Lewis Shock MD            REFERRING PHYSICIAN: Mel Almond MD                  CONSULTING SERVICE:      Pulmonary.            HISTORY OF PRESENT ILLNESS:      This is a 77 year old African American woman with a history of longstanding      rheumatoid arthritis, diabetes, and hypertension, who came to the emergency      room with a 52-month long history of lower extremity edema.  At that time,      she was feeling short of breath as well and was noted to have an elevated      blood pressure.  Bilateral lower extremity venous Dopplers were performed      and there was a small eccentric echogenic thrombus adjacent to the wall of      the common femoral vein on the right, which was nonocclusive.  Otherwise      there appeared to be no abnormalities and the phasic flow and      compressibility and augmentation were normal.            The patient's dyspnea resolved with lowering of her blood pressure.  She      was given hydralazine in the ER.  She was also given Lovenox and started on      Coumadin; however, she has had a significant gastrointestinal bleed in      April of this year, where a bleeding scan demonstrated hepatic flexure      bleeding, but colonoscopy showed diverticulosis with clot noted in the      sigmoid colon.  This area had been injected, but no active vessel or      bleeding was noted after removal of the clot.  She has a remote history of      ulcers as well.  An IVC filter had previously been placed in May of 2010      because of risk of this patient being on chronic anticoagulation.  At that      time, she had nonocclusive thrombus bilaterally in the common femoral vein.            REVIEW OF SYSTEMS:      CONSTITUTIONAL:  No fever,  chills, or weight loss.      PULMONARY:  She does have dyspnea on exertion.  No cough, wheezing, or      hemoptysis.      CARDIAC:  Stable 2-pillow orthopnea, new onset lower extremity edema over      the last month, and gradually progressive dyspnea on exertion over the last      year or 2.      GASTROINTESTINAL:  No recent melena or hematochezia.  She did have bleeding  in April of this year as noted above.  She denies vomiting.  She does have      some /"soreness/" in the right upper quadrant, which is the site of previous                                   Page 1 of 4      [name], [name]      MRN:          [id number]      Account:      [id number]      Document ID:  [id number]      Service Date: 11/29/2009            surgery.      GENITOURINARY:  Some urinary incontinence.  No dysuria.      MUSCULOSKELETAL:  Her rheumatoid disease and has been more active over the      last several years where she has developed actual bony deformities.  She      has intermittent hand stiffness and knee pain.  No current flare up of her      rheumatoid arthritis.            PAST MEDICAL HISTORY:      1.  Rheumatoid arthritis, longstanding.      2.  Hypertension.      3.  Remote history of ulcer.      4.  Hepatic flexure, gastrointestinal colonic bleed, felt to be due to      diverticulosis.      5.  History of gangrenous cholecystitis and post cystectomy sludge in the      common bile duct requiring ERCP.      6.  Type 2 diabetes mellitus.      7.  Hyperlipidemia.      8.  Bilateral DVTs 2010.            PAST SURGICAL HISTORY:      1.  Cataract surgery of the left eye.      2.  Right total hip arthroplasty November 2004.      3.  Left femoral neck fracture with bipolar arthroplasty in 2005.      4.  Excision of a lipoma from the left shoulder, 2007.      5.  Laparoscopic cholecystectomy in September 2008.            SOCIAL HISTORY:      She has been widowed since 18.  She has no children.  She does have a      niece in  [place], and patient currently resides at the [facility] in assisted living.  She ambulates with a walker.  She does      not drink alcohol.  She only smoked for a short period of time as a young      woman, quitting well over 60 years ago.            FAMILY HISTORY:      Her older sister died at age 44 after complications of peripheral vascular      disease, a younger sister died of Alzheimer's.  She is unaware of any      family members have had clotting disorders.            ALLERGIES:      IODINE, CLARITHROMYCIN and DILAUDID  caused shortness of breath, ZITHROMAX      causes altered mental status.            MEDICATIONS:      Vitamin C 1000 mg p.o. every day, aspirin 325 mg p.o. every day, calcium      carbonate 1000 mg p.o. every day, vitamin B12 500 mcg p.o. every day,                                   Page 2 of 4      [name], [name]      MRN:          [id number]      Account:      [id number]      Document ID:  [id number]      Service Date: 11/29/2009            Lovenox 65 mg subcutaneous b.i.d., folic acid 1 mg p.o. daily, Neurontin      600 mg p.o. b.i.d., Glucotrol 5 mg p.o. b.i.d., Atarax 5 mg p.o. every day,      Lantus insulin 20 units subcutaneously nightly, magnesium oxide 400 mg p.o.      every day, Protonix 40 mg p.o. every day, Carafate 1 g p.o. nightly.            PHYSICAL EXAMINATION:      GENERAL:  Reveals a pleasant, frail-appearing, elderly woman who does      appear younger than her stated age, in no respiratory distress.  Weight is      63 kg, body surface area 1.78 meters squared.      VITAL SIGNS:  Current blood pressure is 152/66 (maximum blood pressure in      the emergency room was 220/98), temperature 97.3, pulse 81, respiratory      rate is 19, oxygen saturation 96% on room air.      HEAD AND NECK:  Sclerae are anicteric.  Arcus senilis is noted bilaterally.      NECK:  Shows pulsating venous waves at the lower cervical edge, no      excessive JVD noted,  no accessory muscle use, and no stridor.      LUNGS:  Have few scattered rales at the bases clearing with deeper      inspiration.      CARDIAC:  Regular rate and rhythm with a II/VI holosystolic murmur.      ABDOMEN:  Minimally protuberant, but soft, no guarding or rebound, no      tenderness.  Bowel sounds are active.      EXTREMITIES:  Small 2 cm circular indurated area of erythema along the      medial aspect of the right lower extremity, which is painful to touch, warm      and mildly indurated.  The remainder of the legs are normal, without any      distal edema, although changes in the skin indicate where the patient      likely had recent edema.  She has rheumatoid deformities of both hands and      knees.            IMPRESSIONS:      1.  Chronic-appearing nonocclusive /"thrombus/" adherent to the wall of the      right common femoral vein in an area of previous deep venous thrombosis      from 1 year ago,  likely indicating chronic thrombus, and not requiring      anticoagulation.      2.  Superficial thrombophlebitis of the right lower extremity.      3.  Bilateral lower extremity edema, most likely related to hypertension.      4.  Dyspnea on exertion, lower extremity edema, and orthopnea in the      setting of hypertensive heart disease.  The patient has had previous      echocardiograms, which do not show any evidence of left ventricular      dysfunction.  Physical examination; however, might suggest presence of some      mitral regurgitation.            RECOMMENDATIONS:      1.  The patient should resume afterload reduction for hypertension control.       Apparently she was taking hydralazine as an outpatient.      2.  Continue with low-dose Lasix and or other diuretic in conjunction with      her blood pressure medication.                                   Page 3 of 4      ALYSSON, GEIST      MRN:          [id number]      Account:      [id number]      Document ID:  [id number]      Service Date:  11/29/2009            3.  Elevation of the right lower extremity with warm compresses and pain      control for superficial thrombophlebitis.      4.  Discontinue Lovenox.  The patient has had a significant      gastrointestinal bleed and has contraindication to anticoagulation:  Not      only that, but she has no current indication for acute anticoagulation.                        Electronic Signing Provider            D:  11/29/2009 16:43 PM by Dr. Verlon Au L. Quinita Kostelecky, MD ([id number])      T:  11/29/2009 17:33 PM by [id number]                  YQ:MVHQI Lim MD                                   Page 4 of 4      Authenticated by Lewis Shock, MD On 11/30/2009 04:35:09 PM

## 2011-02-16 NOTE — Discharge Summary (Signed)
[name], [name]      MRN:          [id number]      Account:      [id number]      Document ID:  [id number]                  Admit Date: 03/08/2010      Discharge Date: 03/15/2010            ATTENDING PHYSICIAN:  Worthy Keeler, MD                  This is a 77 year old African-American female who was readmitted through      the emergency room with a chief complaint of both knee pain and stiffness      and unable to move easily and neck pain and some dehydration with general      body weakness.  Also, patient has been suffering from both lower legs      cellulitis, comes and goes since many years.  Therefore, she was brought to      this emergency room.  She is living alone at the [facility] senior      apartment complex.  According to this patient, she has been eating only      once a day with [employer], which has developed to dehydration since      about 1 month.  She also has had multiple medical problems including      rheumatoid arthritis with deformed both hands and also total hip      replacement surgery bilaterally in 2004 and 2005.  Also as I mentioned      above, both lower legs cellulitis, chronic type, comes and goes.  Also,      patient has had recurrent urinary tract infection, hypertension, type 2      diabetes mellitus, coronary artery heart disease, hyperlipidemia.  Also,      patient has history of deep vein thrombosis with status post inferior vena      cava filter placement in 2010.  This happened was after Procrit therapy      with anemia.  Therefore, this patient is not a good candidate, very poor      candidate to have treatment with Procrit.  Also, patient has a history of      bilateral total hip replacement that was done by Dr. Lenis Noon in 2004 and      2005.  Also patient had history of recurrent gastrointestinal bleeding with      diverticulosis and acute gastritis which was admitted last admission.  At      that time, the patient had an upper endoscope study was performed by Dr.       Dalbert Mayotte, which found acute gastritis.  However, patient was treated with      Carafate 1 gram by mouth at bedtime for a while.  Also, patient has had a      history of neurogenic bladder with urinary tract infection and also history      of pancreatitis, but as a surgical history, patient has a history of      cholecystectomy with gallstone and as I mentioned above, bilateral hip      replacement.            ALLERGIES:      IODINE CONTRAST, DILAUDID and CLARITHROMYCIN.            SOCIAL HISTORY:  She quit smoking about 50 years ago, but still smoking sometime 6 to 7      cigarettes daily over 10 years, but she is not a drinker.            FAMILY HISTORY:      Noncontributory.                                         Page 1 of 6      TATELYN, VANHECKE      MRN:          [id number]      Account:      [id number]      Document ID:  [id number]                  PHYSICAL EXAMINATION:      VITAL SIGNS:  On admission, temperature 98, pulse rate 70 per minute,      respirations 18, blood pressure 156/70, pulse oximetry 95% with room air.      GENERAL:  Well-developed.  Dry skin with some mild dehydration, both lower      legs cellulitis, and also both knees 3-compartment osteoarthritis with      severe pain.      HEENT:  Unremarkable.      NECK:  Stiff with C4 and C5 degenerative changes with a C5 and C6 disk      space narrowing.  This was found by cervical spine x-ray.      LUNGS:  Few rhonchi audible with mild crackle sounds at both lung bases,      and with mild congestive heart failure, and with pulmonary congestion.      HEART:  Regular sinus rhythm, 74 per minute.  Normal S1, S2.  Early grade I      systolic murmur audible with S4 gallop sound at the aortic area and left      side along the sternal border, but no friction rub audible.      ABDOMEN:  Soft.  No tenderness to palpation.  No hepatosplenomegaly.  No      mass palpable.  No hernia.      EXTREMITIES:  Both lower legs erythematous skin discoloration with  possible      acute cellulitis, and both knees very painful with limited range of motion      because of severe pain with 3-compartment osteoarthritis.      NEUROLOGIC:  Unremarkable at this time.            LABORATORY FINDING:      Initial CBC:  WBC count 14,890, hemoglobin 8.8; hematocrit 27, platelet      count 281,000.  Repeat CBC was performed on second hospital day.  White      blood cell 11,410, hemoglobin 8.6, hematocrit 26.9, platelets 267,000.      Sodium 138, potassium 4.0, chloride 106, CO2 26, BUN 14, creatinine 0.8,      blood sugar 106, calcium 8.0.  GFR over 60, which is normal.  Total      bilirubin was 0.5, ALK 193, ALT 43, AST 109, which was elevated.  Acetone      was negative.  Initial BNP was 498 with diastolic dysfunction with      congestive heart failure.  Chest x-ray was taken and showed mild congestive      heart failure with left  pleural effusion, which was small.  Repeat BNP was      204, which was improved congestive heart failure.  Stool occult blood was      performed showing negative.  MRSA was negative.  Urine culture was no      growth 2 times, and influenzae antigen A and B were negative.  Blood      culture x2 was no growth.  Anemia study was performed.  Folate level 16.4,      ferritin level 197, vitamin B12 1418, which was slightly elevated, and iron      saturation 40, iron level 67, total iron bind capacity was 167, which is      low.  Repeat CBC was performed.  White blood cell count 11,000, hemoglobin      7.8, hematocrit 24.5, platelet count 341,000.            Consultation was obtained with hematologist, Dr. Tamala Bari.  After careful      evaluation, the patient close observation.  So, according to Dr. Tamala Bari,      patient had Procrit therapy last year which developed the blood clot.      Therefore, patient developed DVT, and therefore, the patient had inferior      vena cava filter placement.  However, this patient is poor candidate for                                    Page 2 of 6      ENSLEE, BIBBINS      MRN:          [id number]      Account:      [id number]      Document ID:  [id number]                  Procrit therapy at this time, so Dr. Tamala Bari recommended if hemoglobin less      than 8.0, then patient may need 1 unit packed cell transfusion.  Serial      hemoglobin and hematocrit were performed showing patient with improved      hemoglobin without special treatment.  On the day of discharge, the      hemoglobin was 8.3, hematocrit 25.6, which was improved.  Repeat      electrolytes showed sodium 139, potassium 3.4 which was low, chloride 105,      CO2 30, BUN 22, creatinine 0.9, blood sugar 88.  Therefore, the patient was      treated with potassium chloride 20 mEq 1 dose given.  On this regimen, the      patient was improved.  Erythropoietin level was 51.0 which was high level.      However, the patient, therefore, did not receive blood transfusion at this      time.  Knee x-ray was taken showing 3-compartment syndrome with      osteoarthritis, most likely lateral joint.  Also, cervical spine x-ray was      taken, showing C3, C4, and C5 between spur formation with degenerative      changes.  Also, C5 and between C6 with disk space narrowing, but there are      no acute fractures with chronic chondrocalcinosis was found.  Chest x-ray      was taken.  Small bilateral pleural effusion with mild congestive heart      failure with diastolic dysfunction.  DIAGNOSES AND IMPRESSION:      1.  Congestive heart failure, which was mild with diastolic dysfunction.      2.  Chronic anemia.      3.  Both knees osteoarthritis, 3-compartment joint arthritis, mostly in the      lateral joint.      4.  Cellulitis, both lower legs.      5.  Type 2 diabetes mellitus.      6.  Rheumatoid arthritis.      7.  Hyperlipidemia.      8.  Neurogenic bladder with a history of chronic urinary tract infection.      9.  Hypertension.      10.  Cervical spondylitis with a C5-C6 disk space  narrowing.      11.  History of deep venous thrombosis with inferior vena cava filter      placement last year, 2010, with post Procrit treatment.      12.  Gastrointestinal bleeding from last admission with acute gastritis.            RECOMMENDATIONS:      1.  An 1800-calorie diabetic cardiac diet.      2.  This patient should receive followup care by Dr. Loma Newton, cardiologist, for      diastolic dysfunction with congestive heart failure.  Also, patient      recommended to see Dr. Nedra Hai, hematologist as outpatient followup care.      Also, Dr. Brent Bulla, palliative care specialist.  Also, patient recommended      to see Dr. Mare Ferrari, orthopedic surgeon who did have both bilateral hip      replacement, orthopedic surgeon for the knee joint evaluation, the patient      was strongly recommended.  Also, patient home care RN, home PT and OT was      arranged.                                         Page 3 of 6      AYAKO, TAPANES      MRN:          [id number]      Account:      [id number]      Document ID:  [id number]                  DISCHARGE MEDICATIONS:      1.  Baby aspirin 81 mg p.o. daily.      2.  Lasix 20 mg p.o. daily.      3.  Neurontin 600 mg p.o. every 12 hours.      4.  Glucotrol 5 mg p.o. twice a day.      5.  Hydralazine 10 mg p.o. every 8 hours.      6.  Lantus 10 units at bedtime subcutaneously.      7.  Magnesium oxide 400 mg p.o. daily.      8.  Prednisone 5 mg p.o. daily for rheumatoid arthritis.      9.  Norvasc 10 mg p.o. daily for hypertension.      10.  Zocor 20 mg p.o. daily.      11.  K-Dur 20 mEq p.o. daily.      12.  Januvia 100 mg p.o. daily.      13.  Protonix 40 mg in the morning every morning.  14.  Carafate 1 gram at bedtime.      15.  Folate 1 mg p.o. daily.      16.  Vitamin B12 500 mcg p.o. daily.      17.  Calcium carbonate 500 mg p.o. daily.      18.  Vitamin C 1000 mg p.o. daily.      19.  Keflex 250 mg p.o. every 8 hours for chronic cellulitis.            Also, the patient  recommended to have a basic metabolic profile, chemistry      checkup including renal panel 2 times a week, Monday and Thursday for check      potassium.            HOSPITAL COURSE:      This patient was admitted to the medical floor with a diagnosis, as I      mentioned above, multiple medical problems including congestive heart      failure which was mild with diastolic dysfunction, chronic anemia, both      knee osteoarthritis, cellulitis both lower legs, type 2 diabetes mellitus,      rheumatoid arthritis.  Also, cervical spondylitis with C5-C6 degenerative      changes.  Also, history of gastrointestinal bleeding with acute gastritis      from last admission.  Patient was placed on bed rest with oxygen 1 liter      per minute by cannula.  The patient was started on antibiotics and an      1800-calorie diabetic and cardiac diet.  The patient was started on      vancomycin 1 gram IV q.12 h. with Rocephin 1 g IV every 12 hours.  The      patient was started on morphine for severe pain every 4 hours p.r.n.  Also      the patient was started on a baby aspirin 81 mg p.o. daily, Lasix 20 mg      p.o. daily for mild congestive heart failure, Glucotrol 5 mg twice a day      for diabetes, Norvasc 5 mg p.o. daily, Zocor 20 mg p.o. daily for      hyperlipidemia.  Also the patient was started on Protonix 40 mg p.o. every      morning with Carafate 1 gram at bedtime  for acute gastritis.  On this      regimen, the patient was somewhat improved.  Consultation was obtained with      cardiology, Dr. Loma Newton.  After careful evaluation, the patient recommended to      continue to take Lasix 20 mg p.o. daily with Norvasc 5 mg p.o. daily for                                   Page 4 of 6      OLIMPIA, TINCH      MRN:          [id number]      Account:      [id number]      Document ID:  [id number]                  high blood pressure.  On this regimen, her shortness of breath improved.      BNP 204, which was improved.  Another  consultation was obtained with Dr.      Dan Humphreys, infectious  disease specialist, with a diagnosis of both lower legs      cellulitis.  After careful evaluation, the patient was markedly improved;      therefore, discontinue vancomycin and Rocephin, and switched to Keflex 250      mg twice a day.  However, on this regimen, the patient was doing well.      Another consultation was made with Dr. Tamala Bari, hematology oncologist.      After careful evaluation, this patient is poor candidate for Procrit      therapy.  Therefore, patient recommended blood transfusion with 1 unit of      packed cell if hemoglobin lower than 8.  However, the patient's H and H was      getting better, improved.  On the day of discharge, hemoglobin 8.3;      hematocrit 25.6; platelet count 343,000.  The patient's anemia somewhat      improved; therefore blood transfusion was not performed.  On the day of      discharge, potassium 3.4.  Therefore, the patient was treated with      potassium chloride 20 mEq p.o. given, and recommended K-Dur 20 mEq p.o.      daily because hypokalemia.  However, this patient was strongly recommended      to have followup care by Dr. Loma Newton, Dr. Nedra Hai, hematologist, Dr. Brent Bulla, who      is a palliative care specialist, and also Dr. Lenis Noon, orthopedic for both      knee pain with osteoarthritis.  The patient fully understood.  The patient      was fully informed after careful evaluation.            On the day of discharge, the patient doing well, and also PT, OT evaluation      and treatment.  After careful evaluation with PT, OT therapy, the patient      is markedly improved.  She is very happy.  The patient walking around the      nurse's station without any special complaining.  The patient feeling much      improved, better.  However, as I mentioned above, the patient was strongly      recommended to have followup care by Dr. Loma Newton, cardiologist, for diastolic      dysfunction of heart, and also infectious disease  specialist, Dr. Dan Humphreys,      Dr. Rebecca Eaton.  The patient strongly recommended to keep taking medicine,      Keflex 250 mg twice a day until see Dr. Rebecca Eaton as outpatient.  Also,      patient recommended to see as I mentioned above, Dr. Nedra Hai and Dr. Tamala Bari,      hematologist, for chronic anemia.  Also, patient recommended to see Dr.      Brent Bulla as outpatient for palliative care the last many months.  On the day      of discharge, the patient was markedly improved.  Therefore, the patient      was discharged home today in good condition.                                    D:  03/15/2010 16:51 PM by Dr. Mel Almond, MD [phone])      T:  03/16/2010 07:06 AM by [id number]  Page 5 of 6      NETTYE, FLEGAL      MRN:          [id number]      Account:      [id number]      Document ID:  [id number]                  cc:                                   Page 6 of 6      Authenticated by Mel Almond, MD [phone]) On 03/17/2010 05:06:24 PM

## 2011-02-16 NOTE — Op Note (Signed)
[name], [name]      MRN:          [id number]      Account:      [id number]      Document ID:  [id number]      Procedure Date: 08/23/2009            Admit Date: 08/18/2009            Patient Location: M323-01      Patient Type: I            SURGEON: Myles Gip MD      ASSISTANT:                  REFERRING PHYSICIAN:      Dr. Worthy Keeler.            TITLE OF PROCEDURE:      Colonoscopy with epinephrine injection.            PREOPERATIVE DIAGNOSES:      Bright red blood per rectum, history of diverticulosis.            POSTOPERATIVE DIAGNOSES:      Bright red blood per rectum, history of diverticulosis, and sigmoid      diverticulosis with clot.            MEDICATIONS:      As per anesthesiology.            FINDINGS:      The rectal examination was normal.  The prep was good.            DESCRIPTION OF PROCEDURE:      The colonoscope was inserted through the anus and advanced to the terminal      ileum.  There was maroon-colored blood and stool throughout the entire      colon with bright red blood.  The terminal ileum appeared normal.  There      was no blood in the terminal ileum.  There were sigmoid diverticula noted      and in one of the larger diverticula there was a blood clot.  This area was      injected with epinephrine and the blot was removed.  There was no visible      vessel or other entity at this point.  There was no bright red blood noted      and no obvious spurting or oozing.            PLAN:      Continue supportive measures per primary team and if the patient has acute      gastrointestinal bleed, proceed to tag red blood cell scan and to be seen      by interventional radiology for possible embolization.                                                     Page 1 of 2      [name], [name]      MRN:          [id number]      Account:      [id number]      Document ID:  [id number]      Procedure Date: 08/23/2009            Electronic Signing Provider  D:  08/23/2009 12:16 PM by Dr. Myles Gip, MD ([id number])      T:  08/23/2009 13:00 PM by [id number]                  EX:BMWU Gracin Mcpartland MD                                   Page 2 of 2      Authenticated by Myles Gip, MD On 08/31/2009 12:25:26 PM

## 2011-02-16 NOTE — H&P (Signed)
[name], [name]      MRN:          [id number]      Account:      [id number]      Document ID:  [id number]                  Admit Date: 02/13/2010            Patient Location: M424-01      Patient Type: I            ATTENDING PHYSICIAN: Eunice Blase, MD                  CHIEF COMPLAINT:      Diarrhea, nausea, and vomiting.            HISTORY OF PRESENT ILLNESS:      This 77 year old female with past medical history significant for diabetes,      rheumatoid arthritis no bony deformities, diverticulitis, status post      bilateral hip replacement.  The patient presented to the ED for the above      listed complaint was found to be dehydrated.  Initial set of vital signs      recorded a blood pressure of 135/66, pulse of 80, respiration 18, temperature      97.4.  Denies any fever or chills.  Admitted to protracted diarrhea, nausea,      and vomiting.  The patient was found to be significantly dehydrated.  Initial      laboratory work revealed evidence of urinary tract infection positive leukocyte      esterase and nitrites of numerous WBC.  The patient received first dose of      Rocephin in the emergency room after urine and blood cultures were done      subsequently admitted to the floor for IV hydration and IV antibiotics.            PAST MEDICAL HISTORY:      1.  Hypertension.      2.  Diabetes.      3.  Rheumatoid arthritis.      4.  Coronary artery disease.      5.  Generalized deconditioning.      6.  History of deep venous thrombosis status post IVC filter insertion.      7.  Bilateral total hip replacement, right in 2004 and left in 2005.      8.  History of lipoma removal.            PAST SURGICAL HISTORY:      As stated above.            FAMILY HISTORY:      Nil of significance.            SOCIAL HISTORY:      Currently retired, at the [employer] assisted living.  Denies any      alcohol or recreational drug use in the past.                                         Page 1 of 3      [name], [name]       MRN:          [id number]      Account:      [id number]      Document  ID:  [id number]                  MEDICATION ALLERGIES:      BIAXIN, DILAUDID, and IODINE.            CURRENT MEDICATIONS:      Glipizide, aspirin, hydroxyzine, magnesium oxide, gabapentin, prednisone,      furosemide, hydralazine, and Zestril.            REVIEW OF SYSTEMS:      CONSTITUTIONAL:  Generalized weakness.  Denies any fever or chills.      GASTROINTESTINAL:  Diarrhea, nausea, vomiting, and abdominal discomfort.      GENITOURINARY:  No urinary frequency.      NEUROLOGIC:  Denies any discharge and lower abdominal discomfort.      ENDOCRINE:  History of diabetes.  Denies any heat or cold intolerance.      MUSCULOSKELETAL:  Generalized muscle pain and contracture formation in the      arm, and leg pain.            PHYSICAL EXAMINATION:      GENERAL:  Elderly looking woman, not in acute distress.      VITAL SIGNS:  Noted a blood pressure of 135/66, pulse of 80, respirations      19, temperature of 97.7, saturating 100% on room air.      HEENT:  Dry conjunctiva, age-related changes around the neck.  Trachea      central.  No JVD.      LUNGS:  Clear to auscultation bilateral without other sound.      CARDIOVASCULAR:  S1, S2 appreciated without murmur, gallop, or rub.      ABDOMEN:  Soft, bowel sounds present, nondistended, tender in the upper      abdomen, mild discomfort in the suprapubic area.      GENITOURINARY:  Reduced urine output.      EXTREMITIES:  Evidence of rheumatoid arthritis with deformity, no edema,      moves all 4 extremities.      PSYCHIATRIC:  The patient alert, follows commands, no acute neurologic      abnormalities.            LABORATORY DATA:      WBC of 17.65, hemoglobin and hematocrit 10.5 and 31.8 respectively, MCV      89.6, platelet count of 170.  Chemistry:  Sodium of 141, potassium 4.5,      chloride 107, bicarbonate of 25, BUN and creatinine of 45 and 1.4      respectively with glucose of 59, alkaline  phosphatase of 231, and ALT 78,      AST 127.  Urinalysis positive for nitrites and leukocyte esterase and large      numerous.            ASSESSMENT:      1.  Urinary tract infection.      2.  Mild to moderate dehydration.      3.  Generalized deconditioning.      4.  Diabetes.                                   Page 2 of 3      [name], [name]      MRN:          [id number]      Account:      [id number]  Document ID:  [id number]                  5.  Hypertension.      6.  Severe rheumatoid arthritis bony deformity.      7.  Coronary artery disease.      8.  Status post bilateral hip replacement.            PLAN:      Admit to general medical floor, intravenous antibiotics with Rocephin.      Blood and urine cultures prior to the intravenous hydration, glycemic      control with high resistance insulin regimen, statin and low-dose aspirin.      Hemoglobin A1c level.  Gastrointestinal prophylaxis with Protonix.  Deep      venous thrombosis prophylaxis with Lovenox.            CONDITION OF PATIENT ON ADMISSION:      Guarded.            Time taken for initial evaluation and management 45 minutes.                        Electronic Signing Provider            D:  02/14/2010 15:02 PM by Dr. Gretta Began. Higinio Plan, MD ([id number])      T:  02/14/2010 16:19 PM by [email or web address]                  cc:                                   Page 3 of 3      Authenticated and Edited by Gretta Began. Higinio Plan, MD ([id number]) On 02/22/10 10:00:25 AM

## 2011-02-16 NOTE — H&P (Signed)
Account Number: [id number]      Document ID: [id number]      Admit Date: 05/06/2009            Patient Location: [id number]      Patient Type: P            ATTENDING PHYSICIAN: Blinda Leatherwood, MD                  POST-ADMISSION ASSESSMENT:      The patient presents with dysfunction in self care, mobility and endurance      secondary to debility from recent sepsis syndrome related to infection,      possibly biliary versus respiratory.            CHIEF COMPLAINT:      Overall weakness.            HISTORY OF PRESENT ILLNESS:      The patient is a 77 year old very pleasant female who was admitted on      April 21, 2009 after change in mental status.  She was found to be      significantly febrile with a temperature of 103 with sepsis.  She has a      history of biliary obstruction with pancreatitis.  She was seen by Dr.      Janne Napoleon of general surgery, as well as infectious disease, who placed on IV      antibiotics, and she also received IV fluid hydration.  With this treatment      course, her mental status improved.  She defervesced, and her leukocytosis      improved.  It is thought that her fever and leukocytosis is related to      biliary source, though it is possible she may have had a pneumonia.  She      did have an elevated alkaline phosphatase which is having a decreasing      trend.  She has clinically improved and has overall been asymptomatic other      than generalized weakness.  She completes her antibiotics on the evening of      May 06, 2009.  She has been cleared to participate in interdisciplinary      therapies at [facility] rehabilitation unit on May 06, 2009.            Currently, the patient reports just generalized weakness.  Otherwise, she      denies any headache, visual changes, chest pain, shortness of breath,      palpitations, abdominal pain, nausea, vomiting, diarrhea, constipation.      She has numbness in bilateral lower limbs related to diabetes but denies      any tingling  sensations.            PAST MEDICAL HISTORY:      1.  History of fever and sepsis, possibly from biliary source versus      pneumonia.      2.  Debility secondary to #1.      3.  Diabetes.      4.  Hypertension.      5.  Rheumatoid arthritis on chronic prednisone therapy.      6.  Hyperlipidemia.      7.  History of coronary disease.      8.  History of diverticulosis.      9.  History of GI bleed.      10.  History of DVT status post IVC filter.  11.  History of laparoscopic cholecystectomy for gangrenous gallbladder in      2008 complicated by postoperative biliary pancreatitis and bacteremia.      12.  Bilateral total knee replacements, right in 2004, left in 2005.      13.  History of lipoma surgery.            FAMILY MEDICAL HISTORY:      Not reviewed and noncontributory to chief complaint.            SOCIAL HISTORY:      The patient is retired.  She lives in [facility] in assisted living      apartment.  Staff are able to come and check in on her twice a week, but      she does not have 24-hour supervision or assistance available to her.  She      has many friends who are supportive.            FUNCTIONAL HISTORY:      She was independent prior to admission.  She receives Meals on Wheels.  She      managed her own medications.  Currently, with therapy, she is supervision      to don her right sock, moderate assist on her left sock secondary to elbow      arthritis resulting in decreased extension, supervision with a rolling      walker to the bathroom, supervision for light grooming and hygiene standing      at sink side, supervision to modified independent ambulating with a rolling      walker with occasional contact guard assist.  She is independent with her      bed mobility.  She has variable performance from modified independent to      minimal assist for sit to stand.            ALLERGIES:      BIAXIN, DILAUDID and IODINE all result in shortness of breath.            MEDICATIONS:      1.   Norvasc 5 mg daily.      2.  Aspirin 325 mg daily.      3.  Lovenox 30 mg subcutaneous daily.      4.  Folic acid 1 mg daily.      5.  Lasix 20 mg daily.      6.  Gabapentin 600 mg b.i.d.      7.  Glipizide 5 mg p.o. b.i.d. a.c.      8.  Hydroxyzine 50 mg b.i.d.      9.  Lantus 14 units subcutaneously nightly.      10.  Magnesium oxide 400 mg p.o. daily.      11.  IV Zosyn 3.375 q.8 h.  Last dose is deceased evening and to be      discontinued.      12.  Protonix 40 mg p.o. daily.      13.  Prednisone 10 mg p.o. q.24 h.      14.  Sucralfate date 1 g p.o. nightly.      15.  Tylenol 650 mg q.4 h. p.r.n.      16.  Insulin sliding scale.            PHYSICAL EXAMINATION:      VITAL SIGNS:  Temperature 97.4, pulse 70, respirations 18, blood pressure      157/70, pulse oximetry is 99%.  GENERAL:  The patient appears stated age.  She is well-developed and in no      acute distress, sitting in a chair at bedside.      HEENT:  Head is atraumatic.  Extraocular movements are intact.  Moist      mucous membranes.      NECK:  Supple.      CHEST:  S1, S2 with a soft systolic murmur of normal rate.      LUNGS:  Clear to auscultation bilaterally, decreased at the bases.      ABDOMEN:  Soft, nontender, nondistended.  She has some mild ecchymosis in      the area where Lovenox injections have been given.      EXTREMITIES:  No significant cyanosis, clubbing or edema.  No calf      tenderness.      NEUROLOGIC:  The patient is awake, alert, oriented to self and situation.      She is able to give the accurate year and month, as well as our current      president.  Basic speech, language and cognition appear to be intact for      age.  She moves all 4 limbs symmetrically with strength 4+ to 5- in all      segments bilateral upper and lower limbs.  She follows commands      consistently.  She has decreased light touch sensation in stocking      distribution in bilateral lower limbs consistent with polyneuropathy.      SKIN:  She has  venous stasis changes in bilateral lower limbs.            DIAGNOSTIC STUDIES AND LABORATORY DATA:      As per results and reports in [facility].            IMPRESSION AND PLAN:      The patient presents as documented on preadmission screening.  She presents      with dysfunction in self care, mobility, secondary to debility related to      recent sepsis syndrome from possible biliary source versus pneumonia.  She      also has multiple comorbid medical conditions.  She is to be admitted to      acute inpatient rehabilitation to participate in intensive      interdisciplinary therapies including physical therapy, occupational      therapy and recreational therapy, in order to address above areas of      dysfunction and work on increasing functional independence with the goal of      reaching modified independence in order to allow her return to her assisted      living apartment independently.  She will require 24-hour rehabilitation,      nursing care and frequent physician visits to continue to monitor her blood      pressure and make adjustments as appropriate to her blood pressure      medications, monitor Accu-Cheks, given her history of diabetes, and adjust      medications as appropriate, monitor for rheumatoid flares, as this may      interfere with therapy, for ongoing signs or symptoms of infection      including white blood cell count, as she has a recent leucocytosis, monitor      anemia, as this may contribute to her and her endurance, monitor her renal      function, given her history of IV antibiotics, continue to monitor her  alkaline phosphatase levels, given that it source of infection may have      been biliary, monitor for pain and provide appropriate interventions to      rule out ongoing participation in therapy, evaluate fall and safety risk on      a daily basis, regulate sleep-wake cycle, regulate bowel and bladder, and      reinforce therapeutic strategies during non-therapy times.  Physical       medicine and rehabilitation physician will be on service as attending.      Case management will be consulted to assist in discharge planning and      resource management.            We will also consult infectious disease team, as the patient is known to      their service for ongoing monitoring for signs and symptoms of infection.      We will also consult Dr. Norval Morton of internal medicine for ongoing      medical management, as the patient is known to Dr. Chestine Spore.            We will obtain followup CBC and basic metabolic panel for ongoing      monitoring of her white blood cell count, anemia and renal function.  We      will obtain a screening urinalysis and urine culture and MRSA nares      culture.            We will continue Lovenox 30 mg subcutaneous daily for DVT prophylaxis.  The      patient already has an IVC filter in place for history of DVT.  She      currently not a candidate for Coumadin, given her age and fall risk.            We will continue transfer medications as noted above.            ESTIMATED LENGTH OF STAY:      Approximately 5 to 7 days.                        Electronic Signing Provider      _______________________________     Date/Time Signed: _____________      Blinda Leatherwood MD ([id number])            D:  05/06/2009 15:25 PM by Dr. Blinda Leatherwood, MD ([id number])      T:  05/06/2009 16:15 PM by [id number]          Everlean Cherry: [id number]) (Doc ID: [id number])                  CZ:YSAYT Wang MD

## 2011-02-16 NOTE — Discharge Summary (Unsigned)
ATTENDING MD:  Alease Medina, MD      ADMITTED:      02/03/2007      DISCHARGED:    02/19/2007            DISCHARGE DIAGNOSES:      1.   Disorder of biliary tree.      2.   Acute pancreatitis.      3.   Klebsiella bacteremia.      4.   Rheumatoid arthritis.      5.   Diabetes mellitus.      6.   Coronary artery disease.            BRIEF HISTORY:  The patient is an 77 year old female well-known to the      practice for history of rheumatoid arthritis and diabetes mellitus      presenting to emergency department for acute abdominal pain.  Patient was      admitted approximately 1 month prior for gastrointestinal bleed and then      subsequently developed pancreatitis.  Patient had a cholecystotomy tube      placed followed by a laparoscopic cholecystectomy.  No further bleeding.      Ultimately was discharged home and generally doing well.  However, she      comes now with a 48-hour history of abdominal pain.            Her physical examination is as noted in the narrative dictation of the date      of admission, and surgical consultation by Dr. Halina Andreas performed on      the same day.  Patient clearly had pancreatitis possibly related to      retained stone and debris in the biliary tree.  She was admitted, hydrated,      and stabilized.  She then underwent an endoscopic sphincterotomy and      papillotomy to open the biliary tree.  This procedure was performed on      02/06/2007 with apparent good result.  Patient was seen also by infectious      disease because of a documented Klebsiella sepsis.  Patient did well      following the biliary intervention and was also seen by cardiology for mild      dyspnea.  Bilateral Dopplers were obtained, and no deep vein thrombosis was      confirmed.  The patient underwent cardiac diagnostic imaging, was the      recipient of parenteral Zosyn to cover the sepsis and then switch to oral      Augmentin on the above-noted date.            The patient was discharged to  [facility] facility under the care of      Dr. Shirleen Schirmer and will be hopefully returned to my care as an outpatient      within 2 to 3 weeks.                                    ___________________________________     Date Signed: _______________      Alease Medina, MD                  D 07/29/2007  9:01 A; T 07/29/2007 10:14 A; [id number] - - , [id number], #[id number]      CC:  Alease Medina, MD

## 2011-02-16 NOTE — H&P (Signed)
Account Number: [id number]      Document ID: [id number]      Admit Date: 09/10/2008            Patient Location: [id number]      Patient Type: I            ATTENDING PHYSICIAN: Norval Morton, MD                  REASON FOR ADMISSION:      Chest pain with dyspnea, abdominal pain.            BRIEF HISTORY OF PRESENT ILLNESS:      The patient is an 77 year old female with known rheumatoid disease.  Also,      history of pancreatitis related to biliary disorder, previous documented      Klebsiella bacteremia in the setting of urinary tract infection, known      diabetes mellitus, coronary artery disease, and preexistent rheumatoid      disease, previous history of lower GI bleeding related to confirmed      gastrointestinal bleeding.  The patient has been seen in consultation in      the past by Dr. Ellender Hose.  The patient was most recently seen by Dr.      Dalbert Mayotte within the past several weeks.  Was seen in this office in      approximately 2 months prior with reasonable stability of her health.  The      patient now relates progressive periumbilical and diffuse abdominal pain      progressing over the past 72 hours.  A family friend, who was checking on      her, reported that she was also complaining of some substernal pain and      pressure and dyspnea earlier on the day of her admission.  She was brought      to the emergency department and was evaluated for the above complaints.            PHYSICAL EXAMINATION:      VITAL SIGNS:  At the time of her presentation her blood pressure was      approximately 190/130.  Her pulse was 82 and predominantly regular, her      respiratory rate was 22.  Her temperature was 97.      HEENT:  Grossly unremarkable except for bitemporal wasting, atraumatic and      normocephalic.  Pupils are remarkable for mild arcus.  Pupils are equally      responsive.  The ENT examination is essentially normal.      NECK:  Without jugular venous distention or adenopathy.      LUNGS:  Grossly clear  with decreased breath sounds at the bases.      HEART:  Sounds are distant with physiologically split first and second      heart sound.      ABDOMEN:  Hypoactive bowel sounds.  There is no significant distention.      There is a diffuse tenderness without rebound at the present time.  There      is no costovertebral angle tenderness.      NEUROLOGIC:  There is a negative psoas sign bilaterally.  There were no      lateralizing neurologic findings.  The distal perfusion is good.  The      patient has obvious for rheumatoid deformities at all hand and foot joints.            There  is no evidence of gastrointestinal bleeding.            DIAGNOSTIC STUDIES:      A CT scan demonstrates edematous bowel and the presence of a clear-cut      presence of diverticular disease.            CURRENT MEDICATIONS:      Carafate, prednisone 10 mg per day, Norvasc 5 mg, Protonix 40 mg, Tylenol      with codeine as needed, folic acid, and glipizide 5 mg p.o. a.c. and      breakfast and dinner.            REVIEW OF SYSTEMS:      Remarkable for the abdominal pain, without nausea, vomiting, or diarrhea.      There is no dysuria.  She is no shortness of breath or chest pain,      pleuritic or otherwise at the present time.  The remainder of systems are      negative.            ALLERGIES:      CLARITHROMYCIN, DILAUDID IN IODINE.            PLAN:      She will be admitted to the hospital initially in an observation status.      Consultation from Dr. Dalbert Mayotte regarding the abdominal pain and complaint has      been initiated.  The patient will be admitted to a medical bed.  Further      diagnostic studies will be ordered as indicated.                        Electronic Signing Provider      _______________________________     Date/Time Signed: _____________      Alease Medina MD [phone])            D:  09/10/2008 16:57 PM by Dr. Alease Medina, MD [phone])      T:  09/10/2008 17:51 PM by [id number]          Everlean Cherry: [id number]) (Doc ID: [id number])                   XL:[id number] Chestine Spore MD

## 2011-02-16 NOTE — Consults (Signed)
[name], [name]      MRN:          [id number]      Account:      [id number]      Document ID:  [id number]      Service Date: 03/10/2010            Admit Date: 03/08/2010            Patient Location: V956-38      Patient Type: I            CONSULTING PHYSICIAN: Tillie Rung MD            REFERRING PHYSICIAN: Mel Almond MD                  CONSULTING SERVICE:      Infectious disease.            REASON FOR CONSULTATION:      Dr. Jaynie Collins has asked [place] to aid in the management of this patient with fever,      weakness, and fatigue.            Patient is a 77 year old female well known to our service with history of      several admissions to [facility] for gastrointestinal      bleeding, biliary sepsis with bacteremia, pancreatitis, and urinary tract      infections.  I would mention that the patient has longstanding rheumatoid      arthritis, is status post bilateral total hip replacements, and is      maintained on prednisone 5 mg daily.            The patient's most recent admission was from October 17 through February 17, 2010, when she was admitted with an E. coli urinary tract infection.  The      patient was treated with ceftriaxone during her hospitalization and      discharged home without further antibiotic therapy.            The patient was in her usual state of health until the morning of November      9, when she awoke with fever, profound generalized weakness, and increased      pain in her knees and lower extremities.  Patient states that she was so      weak she could not get out of bed, and rescue squad was called.            At the time of presentation to [facility] on November 9,      patient's temperature was 101.1, blood pressure 140/94, pulse 80, oxygen      saturation on room air 97%.            Blood and urine cultures were sent.  A chest x-ray was performed, and      showed mild pulmonary vascular congestion, and small pleural effusion.            There  was concern that the patient had lower extremity cellulitis.  She was      started on vancomycin 1 g q.12 h. and ceftriaxone 1 gram q.12 h.            After treatment with vancomycin and Rocephin, patient's white count has  Page 1 of 4      [name], [name]      MRN:          [id number]      Account:      [id number]      Document ID:  [id number]      Service Date: 03/10/2010            normalized, and her fever resolved.  She is still complaining of      significant bilateral knee pain.            We are asked to make further recommendations regarding antibiotic therapy.            MEDICATIONS:      Rocephin 1 gram q.12 h., vancomycin 1 g q.12 h., Norvasc, vitamin C,      aspirin, Tums, vitamin B12, folate, Lasix, Neurontin, Glucotrol, Vistaril,      insulin, magnesium, prednisone 5 mg daily, Protonix, Carafate, Pravachol,      hydralazine, Glucagon.            ALLERGIES:      BIAXIN, resulting in shortness of breath.  The patient can tolerate      azithromycin; however, unclear allergy to IODINE.            PAST MEDICAL HISTORY:      Hypertension, diabetes.  Longstanding rheumatoid arthritis, maintained on      prednisone 5 mg.  The patient is unable to take aspirin or nonsteroidal      anti-inflammatory drugs secondary to history of bleeding.  Status post      bilateral hip replacements, right hip October 2004, left hip September      2005, following a hip fracture.  History of deep venous thrombosis status      post inferior vena cava filter in the remote past.  History of Helicobacter      pylori diagnosed on biopsy August 1998.  History of neurogenic bladder and      history of urinary tract infections August 2008 and October 2011.  Urinary      tract infections with E. coli documented.  History of Klebsiella bacteremia      in August 2008, associated with cholangitis and cholecystitis.  Status post      laparoscopic cholecystectomy January 28, 2007.  October 2008, patient       readmitted with Klebsiella bacteremia and biliary pancreatitis.  Patient      underwent ERCP and sphincterotomy.  At the time of her procedure, sludge      was discovered, but no stones.  December 23, 2008, patient readmitted with      occult Klebsiella bacteremia.  We were not asked to consult at that time.      Readmission February 19, 2009, with 103 fever of unclear source.            SOCIAL HISTORY:      Patient resides at [facility].  She has a niece who is      involved in her care.  Patient is a former smoker, but quit many years ago.       She does not drink or use illicit drugs.            FAMILY HISTORY:      The patient's mother died when she was only 56 years old of unclear causes.      The patient's father died at age 35 of coronary artery disease and  diabetes.  There have been no ill contacts.            REVIEW OF SYSTEMS:      No headache, no sore throat.  no cough or chest pain.  The patient does                                   Page 2 of 4      [name], [name]      MRN:          [id number]      Account:      [id number]      Document ID:  [id number]      Service Date: 03/10/2010            note mild shortness of breath.  No nausea, vomiting, or diarrhea.  No      dysuria, frequency, or hematuria.  The patient did note profound      generalized weakness.  Upon admission, she was unable to get out of bed      secondary to weakness as well as severe pain in her knees.  She does note a      chronic superficial ulcer on her right lateral calf and a smaller abrasion      on her left shin.  No drainage from either wound.  No itching or rash.  No      focal neurologic complaints.            PHYSICAL EXAMINATION:      GENERAL:  Patient is a well-developed, well-nourished, elderly female who      appears younger than stated age.      VITAL SIGNS:  T-max 99.6, current temperature 99.5 orally, blood pressure      167/70, pulse 78, oxygen saturation on room air 95%.      HEENT:  Sclerae  are anicteric.  Conjunctivae pale.  Oropharynx without      erythema, exudate, thrush, or other significant lesions.      LUNGS:  Clear with decreased breath sounds at the left base.      HEART:  Regular rate and rhythm; II/VI systolic murmur.      ABDOMEN:  Soft, nontender, nondistended.  Normal bowel sounds.  Groin      without rash.      BACK:  No CVA tenderness.  No point tenderness along the spine.  Both hips      reveal well-healed surgical incisions.      EXTREMITIES:  +1 bilateral lower extremity edema.  Feet are warm without      ischemic changes.  Hammertoe deformities are noted.  Both knees reveal      significant arthritic changes.  No significant fluid associated with either      knee.  The right lateral calf has a 1.5 x 1 cm superficial oval ulcer which      measures approximately 1 mm deep.  Tissue is slightly dry, but without sign      of infection or drainage.  The left shin reveals a small abrasion which is      superficial and measures less than 1 cm in diameter.  No periwound      erythema.  No tenderness nor drainage.      NEUROLOGIC:  Patient is awake, alert, oriented, and answering questions      appropriately.      SKIN:  Without  rash.            LABORATORY DATA:      November 9:  White count 14.9; hemoglobin 8.8; hematocrit 27.0; platelets      281,000.  Sodium 137, potassium 3.9, chloride 104, bicarbonate 28, BUN 19,      creatinine 0.9, glucose 79.  Alkaline phosphatase 193, ALT 43, AST 109,      total bilirubin 0.5.  November 9:  Chest x-ray with mild pulmonary vascular      congestion, very slight left pleural effusion.  No obvious infiltrates.            November 10:  White count 11.4; hemoglobin 8.6; platelets 267,000.      November 11:  White count 9.2; hemoglobin 8.3; hematocrit 25.3; platelets      258,000;  70 polycytes, 14 lymphocytes, 13 monocytes.  Sodium 137,      potassium 3.8, chloride 105, bicarbonate 28, BUN 14, creatinine 0.9,      glucose 102, total bilirubin 0.3,  alkaline phosphatase 226, ALT 51, AST 84.                                   Page 3 of 4      [name], [name]      MRN:          [id number]      Account:      [id number]      Document ID:  [id number]      Service Date: 03/10/2010             BNP 204.  Lipase 87.  Repeat chest x-ray with atelectasis.  No clear      evidence of infiltrate.            ASSESSMENT AND RECOMMENDATIONS:      A 77 year old female admitted with fever, leukocytosis, and mild liver      function test abnormalities.            The source of patient's fever is not entirely clear to me.  Dr. Jaynie Collins was      concerned regarding cellulitis.  I do not see distinct evidence of      cellulitis at this time; however, patient has already received 48 hours of      antibiotic therapy.            In any event, it appears that the patient has responded favorably to      empiric Rocephin and vancomycin.  Of note are elevated transaminases and      alkaline phosphatase.  Would note that these were mildly elevated on      admission.            At this point, would suggest continuing present empiric antibiotic therapy.       Suggest lowering dose to vancomycin 1 g q.24 h. and decreasing dose of      ceftriaxone to 1 g q.24 h.            At this time, patient's chief complaint appears to be arthritic pain in her      knees.  I am taking the liberty to order a tapering dose of Solu-Cortef.      Would consider orthopaedic evaluation for possible steroid injections in      the knees.            As stated  above, the source of fever is not entirely clear to me.  Would      pursue further workup based on patient's clinical course and culture      results.            Thank you kindly for courtesy consultation.                        Electronic Signing Provider            D:  03/13/2010 01:03 AM by Dr. Tillie Rung, MD ([id number])      T:  03/13/2010 07:27 AM by [id number]                  cc:                                   Page 4 of 4      Authenticated by Tillie Rung, MD ([id number]) On 04/11/2010 10:55:52 AM

## 2011-02-16 NOTE — Consults (Signed)
[name], [name]      MRN:          [id number]      Account:      [id number]      Document ID:  [id number]      Service Date: 03/15/2010            Admit Date: 03/08/2010            Patient Location: DISCHARGED 03/15/2010      Patient Type: I            CONSULTING PHYSICIAN: Divyang Leia Alf MD            REFERRING PHYSICIAN: Mel Almond MD                  CONSULTING SERVICE:      Palliative Care.            HISTORY OF PRESENT ILLNESS:      The patient is a 77 year old African-American woman who was admitted to the      hospital CHF and dyspnea.  She is known to [place] in the community.  She was      being treated for a chronic nonmalignant pain in her knees.  She had been      using some oxycodone on a p.r.n. basis and had doing quite well with this      until the time that she was admitted.  She was found to be anemic with an H      and H of 8.3 and 25.6.  She is followed for this by hematology.            REVIEW OF SYSTEMS:      She otherwise denied specific complaints for a 12-system review.            PAST MEDICAL HISTORY:      Chronic anemia, history of gastrointestinal bleeding.  She was transfused      in April 2011.  History of rheumatoid arthritis, hypertension, peptic ulcer      disease, gangrenous cholecystitis and sepsis requiring ERCP, diabetes, and      hyperlipidemia.  She had bilateral lower extremity DVTs late in 2010 and      had an IVC filter placed.  She has had prior cataract surgery and a right      total hip replacement.  She has had a fall with left femoral neck fracture.            SOCIAL HISTORY:      She is a widow.  Her husband died around 65.  She does not have any      children.  She does have a niece in [place] who is supportive.  She      has another family member, who I believe is a niece also, in [place].  She lives at the [facility] in [place].  She      lives alone.  She ambulates with a walker.  She does not drink.  She is a      former  smoker, but has not smoked in over 50 years.  She has a home health      support in her apartment.            FAMILY HISTORY:      She has a history of peripheral vascular disease and dementia in her      siblings.  Page 1 of 3      [name], [name]      MRN:          [id number]      Account:      [id number]      Document ID:  [id number]      Service Date: 03/15/2010            ALLERGIES:      She is allergic to DILAUDID, CLARITHROMYCIN, AZITHROMYCIN, and IODINE.      Although, she is allergic to DILAUDID, she has tolerated oxycodone in the      past without any problems.            CURRENT MEDICATIONS:      Norvasc, vitamin C, baby aspirin, Rocephin, B12, folic acid, Lasix,      Neurontin, Glucotrol, hydralazine, insulin, Protonix, Carafate, vancomycin,      hydralazine.            PHYSICAL EXAMINATION:      VITAL SIGNS:  Stable per medical record.      HEENT:  Pupils were equal and reactive.  Sclerae was anicteric.      GENERAL APPEARANCE:  A frail, elderly, African-American woman.  She is      seated in a wheelchair, but is able to ambulate slowly with a walker and      some minimal support.  She does not have any lesions in her mouth.  Oral      mucosa was moist.      NECK:  Supple,  no JVD, no adenopathy.      LUNGS:  Clear.      HEART:  Regular with a soft systolic murmur.      ABDOMEN:  Soft, nondistended, nontender.  Bowel sounds are positive.      EXTREMITIES:   Peripheral vascular exam was normal.  She had no significant      edema and pulses were symmetric.      SKIN:  Warm and dry.      NEUROLOGIC:  She is alert and oriented, pleasant, cooperative, and      appropriate.      SKIN:  Intact.            LABORATORY DATA:      Revealed a white count of 13.08, H and H of 8.3 and 25.6, platelets      343,000.  Sodium 139, potassium 3.4, chloride 105, CO2 is 30, BUN 22,      creatinine 0.9, glucose 88, albumin 2.3.            ASSESSMENT AND PLAN:      At this point, her  anemia is being managed by hematology.  Her pain, she      reports, has been well controlled using only had an episodic oxycodone      tablet.  Tylenol has not helped her in the past.  She estimates that she      uses about 3 tablets a week.  She did not feel she needs more than this.  I      did refill her oxycodone today.  I ordered this for her in the hospital      while she remains an inpatient.  When she is discharged home, we will      follow her at home as well.  We have spoken with her in the past about      hospice support, but at this point, she is  quite satisfied with her home      health support in her apartment.  We will follow with her at home for      palliative care and titrate the oxycodone as needed.  If she is using more      regularly, we can certainly use a sustained-release product.                                   Page 2 of 3      [name], [name]      MRN:          [id number]      Account:      [id number]      Document ID:  [id number]      Service Date: 03/15/2010                  Thank you for this consult.                        Electronic Signing Provider            D:  03/16/2010 08:17 AM by Mr. Nunzio Cobbs, NP [phone])      T:  03/16/2010 10:22 AM by [id number]                  WN:UUVO Alyson Locket MD      Tillie Rung MD      Alonna Buckler MD                                   Page 3 of 3      Authenticated by Nunzio Cobbs, NP [phone]) On 03/17/2010 10:11:06 AM      Authenticated by Marney Setting ([id number]) On 03/21/2010 06:11:48 PM

## 2011-02-16 NOTE — Consults (Signed)
[name], [name]      MRN:          [id number]      Account:      [id number]      Document ID:  [id number]      Service Date: 08/20/2009            Admit Date: 08/18/2009            Patient Location: M323-01      Patient Type: I            CONSULTING PHYSICIAN: Stephens Shire MD            REFERRING PHYSICIAN: Mel Almond MD                  REASON FOR CONSULTATION:      This patient was seen at the request of Dr. Worthy Keeler for preoperative      evaluation.            HISTORY OF PRESENT ILLNESS:      The patient is a very pleasant 77 year old female with a history of      rheumatoid arthritis and diabetes with questionable coronary artery disease      who presented to [facility] with blood in her stool for 3      days.  The patient states that she has also had some mild abdominal      distention and some abdominal discomfort.  The patient also describes some      associated weakness.  She denies any chest pain or shortness of breath.      She ambulates with a walker, but does not climb stairs.  She denies any      worsening chest pain or shortness of breath with exertion.  She does have      worsening weakness over the past several days.  She denies any recent      syncopal episodes.            REVIEW OF SYSTEMS:      Performed and positive for weakness, poor balance, decreased visual acuity      requiring glasses, mild abdominal distention, blood in stool, abdominal      discomfort.  The patient denies any chest pain, shortness of breath, or      headache.  Other systems negative.            PAST MEDICAL HISTORY:      1.  Rheumatoid arthritis      2.  Diabetes.      3.  Hypertension.      4.  Reported hyperlipidemia.      5.  History of DVT status post IVC filter.      6.  Reported history of coronary disease.  The patient states she may have      undergone a cardiac catheterization in 1980s.  No recent cardiac      catheterizations or cardiac surgeries.  Most recent nuclear perfusion study       in May 2010 shows an EF of 65% with no evidence of ischemia.  Most recent      echocardiogram in December 2010 shows normal EF with diastolic dysfunction.            CURRENT MEDICATIONS:  Page 1 of 3      [name], [name]      MRN:          [id number]      Account:      [id number]      Document ID:  [id number]      Service Date: 08/20/2009            Vitamin C, vitamin B12, Neurontin, Glucotrol, hydroxyzine, magnesium,      Protonix.            ALLERGIES:      DILAUDID, CLARITHROMYCIN, IODINE, AZITHROMYCIN.            SOCIAL HISTORY:      The patient never smoked.            FAMILY HISTORY:      Father died at age 41, possibly of coronary disease.            PHYSICAL EXAMINATION:      VITAL SIGNS:  Blood pressure 93/48, respirations 19, heart rate 71,      temperature 100.4.      GENERAL:  Well-appearing elderly female in no acute distress.      ENT:  No mucosal bleeding.  No conjunctival erythema.      NECK:  Brisk carotid upstrokes, no carotid bruits, no increase in JVP.      CHEST:  No crackles or wheezes, no use of accessory muscles for      respiration.      HEART:  Regular rate and rhythm.  S1, S2, no gallops.  Nondisplaced PMI.      ABDOMEN:  Mildly tender, soft, no rebound or guarding.  Bowel sounds      positive.      EXTREMITIES:  Palpable pedal pulses.  No edema.      NEUROLOGIC:  Alert and oriented x3, pleasant affect.  Able to move all      extremities spontaneously.      SKIN:  Normal digits and nails, healing abrasions over leg.            LABORATORY AND DIAGNOSTIC DATA:      ECG independently reviewed by me shows poor R-wave progression without any      acute changes, normal sinus rhythm.            Notable labs:  Hemoglobin 10.0.  Troponin on admission 0.01 on 3 occasions.       Creatinine 0.8, potassium 4.1.            IMPRESSION:      The patient is a very pleasant 77 year old female with a history of      diabetes and rheumatoid arthritis who presents with  blood in stool.  The      patient may require colonoscopy and thereafter surgical intervention and we      were consulted for perioperative evaluation.  The patient has a reported      history of coronary disease and may have been catheterized in the 1980s,      but has not undergone any apparent percutaneous intervention.  In addition,      she has a negative nuclear perfusion study in May 2010.  Her ejection      fraction is preserved.  She does not have any acute changes on      electrocardiogram.  She does not have any symptoms concerning for unstable  Page 2 of 3      [name], [name]      MRN:          [id number]      Account:      [id number]      Document ID:  [id number]      Service Date: 08/20/2009            angina at this point.  The patient is at low clinical risk for a low risk      procedure in colonoscopy or intermediate risk procedure in laparotomy.  She      should overall be at low risk of perioperative cardiac complications;      however, one must always be concerned given her advanced age.  The patient      will not require any further interventions prior to any planned procedures.       Given the patient's diabetes and rheumatoid arthritis and history of      hyperlipidemia she is not on a statin and I will check her lipid panel.      Her LDL goal is less than 100 and statin should be considered if her LDL is      above 100.  Aspirin is being held given her recent bleeding.  Blood      pressure is well controlled.            Thank you for the consultation.  Please feel free to contact me with any      questions that may f                        Electronic Signing Provider            D:  08/20/2009 13:07 PM by Dr. Stephens Shire, MD ([id number])      T:  08/20/2009 23:56 PM by [id number]                  IR:SWNIO Larenda Reedy MD                                   Page 3 of 3      Authenticated by Stephens Shire, MD On 10/10/2009 05:01:32 PM

## 2011-02-16 NOTE — Consults (Signed)
ATTENDING MD:  Alease Medina, MD      CONSULTING MD: Tillie Rung, MD      ADMITTED:      12/10/2006      CONSULTED:     12/17/2006      ROOM:          4B  425 02            REASON FOR CONSULTATION:  To aid in the management of this patient with      bacteremia.            HISTORY OF PRESENT ILLNESS:  The patient is an 77 year old female with a      history of hypertension, diabetes, peptic ulcer disease, history of      Helicobacter pylori infection, history of rheumatoid arthritis, status post      bilateral total knee replacements.  The patient was in her usual state of      health up until the day of admission when she noted maroon jelly-type      stools.  She presented to [facility] Emergency Room for      further evaluation.  Laboratory studies and blood cultures were sent.  The      patient was sent for bleeding scan which was positive and showed suspicion      for diverticular bleed.            The patient was transferred to the intensive care unit, and transfused 2      units of packed red blood cells.  Gastroenterology was consulted and      colonoscopy showed some old blood in the colon and EGD showed a mild      duodenitis.            The patient was stabilized up until 12/14/2006 when she developed a high      fever of 104.5 rectally.  Cultures of urine and blood were sent, the      patient started empirically on Invanz and Flagyl.            As part of her evaluation, the patient had upper endoscopy which revealed      mild duodenitis.  She also had a colonoscopy which showed some old blood.      The patient was noted to have a fever spike yesterday of 104.5.  Blood      cultures and urine cultures were sent.  The 12/14/2006 blood cultures have      grown Klebsiella in 1 of 2 cultures and lactose fermenting Grams in 1 of 2      cultures.  The 12/14/2006 urine culture has grown E. coli, sensitive to      ampicillin.  Based on these blood cultures there was concern for a possible       intraabdominal source of infection.  Further workup disclosed suspicion for      cholecystitis.  Accordingly, interventional radiology placed a pigtail      catheter on 12/15/2006.  Fluid was sent for culture Gram stain but is no      growth so far.  We are asked to make further recommendations regarding      antibiotic therapy.            CURRENT MEDICATIONS:  Include Invanz 1 g daily, Flagyl 500 mg q.8 h.            ALLERGIES:  Biaxin resulting in shortness of breath.  Interestingly,  patient can take azithromycin without any problem.            PAST MEDICAL HISTORY:  Hypertension, diabetes, rheumatoid arthritis on      aspirin and Naprosyn prior to this admission, history of rheumatoid      arthritis status post right hip replacement electively in October 2004,      status post left hip replacement following a left hip fracture in September      2005.            SOCIAL HISTORY:  The patient lives at [facility].  She      does not drink, smoke or use illicit drugs.            FAMILY HISTORY:  Mother died when patient was only 4 of unclear causes.      Father died at age 19 of coronary artery disease and diabetes.  No family      history of colon cancer.            REVIEW OF SYSTEMS:  No headache, dysuria, frequency.  No vomiting or      diarrhea.  Positive for fevers, shakes and chills.            PHYSICAL EXAMINATION:  The patient is an elderly, thin female in no acute      distress. HEENT exam:  Anicteric, conjunctivae clear.  Oropharynx without      erythema, exudate or other significant lesions.  Neck is supple without      lymphadenopathy.  Lungs with fine rales at the bases.  Heart:  Regular rate      and rhythm.  Abdomen is minimally tender in the right upper quadrant.  No      guarding or rebound.  Biliary drain is in place draining normal-appearing      bowel.  Back:  No CVA tenderness, no point tenderness along the spine.      Extremities:  Trace bilateral lower extremity edema.   Neurologically, the      patient is awake, alert, oriented and answering questions appropriately.            LABORATORY DATA:  An 12/14/2006 urine culture with E. coli greater than      100,000 sensitive to ampicillin.  On 12/14/2006, blood cultures 1 of 2 with      Klebsiella and 1 of 2 with lactulose-fermenting gram-negative rods.  On      12/15/2006, bile fluid culture:  No white cells, few gram-negative rods, no      growth so far.  Hemoglobin on admission 8.7.  Most recent hemoglobin 12.3.      Platelets 138,000.  Clotting studies normal.            ASSESSMENT AND RECOMMENDATION:  An 77 year old female with the above      medical history admitted for gastrointestinal bleed, with subsequent      development of high fever, bacteremia and urinary tract infection.  Of note      is the discordance between the organism isolated in the blood and the      urine.  Bile fluid culture of course is still pending.            At this time, would suggest empiric treatment with Zosyn 3.375 g q.8 h.      Can discontinue Invanz and Flagyl.  Would suggest a followup renal      ultrasound for further evaluation.  We will  continue to follow the patient      with you and make further recommendations based on the patient's clinical      course and culture results.                                          Electronic Signing MD: Tillie Rung, MD                  D 05/06/2007  9:18 P; T 05/07/2007  7:35 A; [id number] - - , [id number], #[id number]      CC:  Alease Medina, MD           Tillie Rung, MD

## 2011-02-28 NOTE — Discharge Summary (Signed)
[name], [name]      MRN:          [id number]      Account:      [id number]      Document ID:  [id number]                  Admit Date: 11/14/2010      Discharge Date: 11/20/2010            ATTENDING PHYSICIAN:  Worthy Keeler, MD                  HISTORY OF PRESENT ILLNESS:      This is 77 year old African-American female who was readmitted through the      emergency room with a chief complaint of swollen face and lower lip      swollen, too.  Therefore, she was brought to this emergency room.  She has      had hypertension, type 2 diabetes mellitus, anemia, which was iron      deficiency and gastroesophageal reflux disease with gastritis.  Also, she      has had rheumatoid arthritis for many years and congestive heart failure      with diastolic dysfunction.  Also, patient has had both knee osteoarthritis      and neurogenic bladder with urinary incontinence, cervical spondylitis,      hyperlipidemia.  She was fine until 1 day prior to this admission afternoon      when she noticed the face was swollen sudden onset of also swollen, for      around the lips and therefore, she came in and she has been taking      lisinopril for many years for hypertension and also increased liver      function tests with Zocor and metformin medication and also she has had a      history of urinary tract infection, gastritis, gastroesophageal reflux      disease and chronic iron deficiency anemia and type 2 diabetes mellitus,      hypertension, and also, as I mentioned above, rheumatoid arthritis.  Also,      patient had a history of congestive heart failure with diastolic      dysfunction and also both knees 3 compartment osteoarthritis and the      patient also had a history of deep vein thrombosis, and therefore inferior      vena cava filter placed in the past, hyperlipidemia.  Also, patient had a      history of hepatitis, probably medication of a statin and metformin.            ALLERGIES:      IODINE CONTRAST, DILAUDID, and  CLARITHROMYCIN.            SOCIAL HISTORY:      She quit smoking over 50 years ago, but she is not a drinker.            FAMILY HISTORY:      Noncontributory.            PHYSICAL EXAMINATION:      VITAL SIGNS:  On the day of admission, temperature 97, pulse rate sinus      rhythm, 81 per minute, respirations 18, pulse oximetry 96%, blood pressure      135/60 152/78.      GENERAL:  Well-developed, patient has had some suffering from the      osteoarthritis with rheumatoid arthritis in both hands mentally she  is      alert, oriented.                                   Page 1 of 4      [name], [name]      MRN:          [id number]      Account:      [id number]      Document ID:  [id number]                  HEENT:  Unremarkable.      NECK:  Supple.  No neck stiffness.  Thyroid not palpable.  No jugular vein      dilatation.      LUNGS:  Few rhonchi audible, otherwise clear lungs.      HEART:  Regular sinus rhythm, early grade I systolic murmur audible and      with occasional S4 gallop audible, normal S1, S2, no friction rub audible.      ABDOMEN:  Soft, no tenderness to palpation, no hepatosplenomegaly, no mass      palpable, no hernia.      EXTREMITIES:  No edema, negative Homans sign.      NEUROLOGIC:  There is no focal neurological abnormality seen.            LABORATORY DATA:      Initial CBC:  WBC count 7920, hemoglobin 9.8, hematocrit 30.8; platelet      count 214,000.  Sodium 139, potassium 5.3, chloride 104, CO2 20, BUN was      38, creatinine 2.4, blood sugar 199, calcium 8.8, albumin was 3.5, GFR was      22.8.  Liver function test was performed, this time.  Total bilirubin 0.5,      ALK 236, ALT 27, AST 29.  PT time 14.8, INR 1.2, Accu-Chek initially in the      emergency room, her blood sugar 338.  Repeat CBC was performed the next day      white blood cell count 6590; hemoglobin 9.7; hematocrit 30; and platelet      count 242,000.  Sodium 142, potassium 5.1, chloride 102, CO2 22.  BUN 32,       creatinine 1.5, blood sugar 286, calcium 8.5, lumbar spine x-ray showed new      compression fracture was seen on a lumbar 4 with osteopenia and also old      compression fracture on the L5 was seen. Repeat CBC:  White blood cell      count 9190, hemoglobin 9.4, hematocrit 29.  Repeat electrolytes showed      sodium 147, potassium 4.3, chloride 110, CO2 26, BUN 22, creatinine 0.9,      blood sugar 107, calcium 9.2, phosphorus 3.1.  Albumin was 3.3.  GFR was      over 60, which is normal.  Intact parathyroid hormone 64.  Ferritin level      234.  Kidney sonogram was performed, showed left side simple renal cysts      was found and medical renal disease which was chronic.  The patient was      unable to void bilaterally volume was only 57 patient had developed      dehydration secondary to Lasix treatment.  C1 esterase inhibitor result is      pending.  Compliment 4 showed 27.5, which is normal.  Also, at C3  compliment 132, which is normal, too, and so repeat electrolytes showed      sodium 143, potassium 4.1, chloride 107, CO2 25, BUN 21, creatinine 0.8,      blood sugar 169, calcium 8.7, phosphorus 2.8, albumin 33.1.  Liver function      tests as mentioned above, which was abnormal ALK which showed 265.  ALT 26,      AST 44.  Serum ferritin level 234, which was high, but iron level was low;      hemoglobin A1c was 9.4 which was high and cardiac enzymes:  CK was normal.      C1 esterase inhibitor is pending.  C4 normal, repeat cardiac enzymes:  CK      was normal.  Troponin I was 0.001 x 3 because the patient had atypical      chest pain.  Therefore, cardiac enzymes:  CK and troponin I was performed      which was normal and L spine x-ray was taken, showed new compression      fracture of L4, but L5 stable with old compression fracture with      osteopenia.                                         Page 2 of 4      [name], [name]      MRN:          [id number]      Account:      [id number]      Document ID:   [id number]                  IMPRESSION:      1.  Angioedema, probably secondary to lisinopril ACE inhibitor treatment.      2.  Hypertension, which was stable.      3.  Resolved altered mental status.      4.  Resolved dehydration with acute renal injury.      5.  History of orthostatic hypertension.      6.  Gastroesophageal reflux disease with gastritis.      7.  Osteopenia with a new compression fracture on L4.      8.  Rheumatoid arthritis.      9.  Diastolic dysfunction with a history of congestive heart failure.      10.  Neurogenic bladder which was improved.      11.  Abnormal liver function tests secondary to statin, Zocor treatment .            DISCHARGE MEDICATIONS:      1.  Norvasc 10 mg p.o. daily.      2.  Vitamin C 1000 mg p.o. daily.      3.  Baby aspirin 81 mg p.o. daily.      4.  Lipitor 20 mg p.o. at bedtime.      5.  Calcium citrate 2 tablets p.o. daily.      6.  Benadryl 6.25 mg tablet or liquid every 8 hours for angioedema.      7.  Folate 1 mg p.o. daily.      8.  Neurontin 600 mg p.o. twice a day.      9.  Atarax 50 mg p.o. daily.      10.  Lantus 20 units subcutaneously at bedtime.      11.  Magnesium oxide 400 mg p.o. daily.      12.  Methotrexate 5 mg p.o. every Monday for rheumatoid arthritis.      13.  Protonix 40 mg p.o. every morning.      14.  Prednisone 5 mg p.o. daily for rheumatoid arthritis.            HOSPITAL COURSE:      This patient was admitted to the medical surgical floor with a diagnosis of      angioneurotic edema with history of hypertension and rheumatoid arthritis      and with a history of congestive heart failure with diastolic dysfunction.      The patient was placed on bed rest with oxygen 1 per liter per minute by      cannula.  The patient was started on Norvasc 10 mg p.o. daily.  After      careful evaluation, the patient discontinued lisinopril, ACE inhibitor      discontinued was made because of angioneurotic edema and also consultation      was  obtained with Dr. Thelma Barge, cardiologist, because of blood pressure      going up since stopped lisinopril.  So after careful evaluation with Dr.      Thelma Barge, cardiologist the blood pressure was stable.  There is no evidence      of acute change of blood pressure since patient stopped taking lisinopril;      however, patient continued to take Norvasc 10 mg p.o. daily for her blood      pressure also was treated with Benadryl 6.25 mg IV every 6 hours for      angioedema on this regimen, swollen face, but this resolved and patient was                                   Page 3 of 4      [name], [name]      MRN:          [id number]      Account:      [id number]      Document ID:  [id number]                  fully informed and advised to stop taking lisinopril any kind of ACE      inhibitors.  The patient understood.  Also, the patient was discussed with      Dr. Georga Kaufmann her primary care physician group, which was informed to Dr. Georga Kaufmann therefore, the patient was strongly recommended to be stop lisinopril,      and any kind of ACE inhibitors.  Also, another consultation was obtained      with Dr. Karrie Doffing, nephrologist, because of acute renal injury.  After      careful evaluation, Lasix was discontinued.  Lasix was made and also      potassium pill and patient recommended continued take Norvasc.  Also, the      patient was treated with Venofer for iron 100 mg IV daily for iron      deficiency anemia also patient glucose level was unstable, up and down.      Therefore, the patient Lantus dosage was increased from 15 to 20 units      subcutaneous every evening.  On this regimen, her blood sugar was be      stabilized  175, 181, 160.  Therefore, the patient feeling improved, so      patient was discharged home today for outpatient followup care by Dr.      Thelma Barge, cardiologist, and Dr. Karrie Doffing, nephrologist, and also patient      recommended to see Dr. Selena Batten, immunology/ allergy for angioneurotic edema.       However, on the day of discharge, the patient doing well.  Therefore, patient      discharged home for outpatient followup up care by Dr. Thelma Barge and Dr. Karrie Doffing      and Dr. Selena Batten immunology; however, at this point, on the day of discharge, the      patient needs Benadryl 6.25 mg liquid every 8 hours as she needed.  Also, the      patient needs to see Dr. Selena Batten allergy (immunology) for further evaluation as      outpatient. Again, on the day of discharge, the patient doing well, stable      condition.                                    D:  11/20/2010 19:12 PM by Dr. Mel Almond, MD [phone])      T:  11/21/2010 12:18 PM by [id number]                        cc:                                   Page 4 of 4      Authenticated and Edited by Mel Almond, MD [phone]) On 01/07/11 7:15:20 PM

## 2011-02-28 NOTE — Discharge Summary (Signed)
[name], [name]      MRN:          [id number]      Account:      [id number]      Document ID:  [id number]                  Admit Date: 07/28/2010      Discharge Date: 08/01/2010            ATTENDING PHYSICIAN:  Worthy Keeler, MD                  This is a 77 year old American female who was readmitted through the      emergency room with a chief complaint of general body weakness, feeling      cold, and diarrhea since about 3 days.  She was fine until Wednesday when      she started to have watery diarrhea and general body weakness and cold      feeling, therefore, she went to Dr. _____, her primary care doctor's office      and after evaluation by Dr. _____ recommended to come to the emergency room      for further evaluation and therefore, she was brought to the emergency room      by her friend.  She has had diastolic dysfunction with congestive heart      failure, anemia, urinary tract infection, type 2 diabetes mellitus,      rheumatoid arthritis, and also hypertension.  As I mentioned above, she has      had congestive heart failure with diastolic dysfunction of left ventricle      and chronic anemia with a history of stomach problem and both knee      osteoarthritis with 3 compartment osteoarthritis on both knees.  Also,      history of cellulitis in both legs in the past.  At this time, this patient      has no evidence of cellulitis but the patient has had type 2 diabetes      mellitus and rheumatoid arthritis.  Especially patient was suffering from      the neurogenic bladder, hypertension, and urinary incontinence.  Also,      patient has had cervical spondylitis between C5 and C6 with disk space      narrowing.  Also, patient had history of deep vein thrombosis; therefore,      patient had an inferior vena cava filter placed in the past.            ALLERGIES:      IODINE CONTRAST, DILAUDID AND CLARITHROMYCIN            SOCIAL HISTORY:      She quit smoking 50 years ago, but still smoking 6 to 7  cigarettes daily,      nondrinker.            FAMILY HISTORY:      Noncontributory.            PHYSICAL EXAMINATION:      VITAL SIGNS:  On admission, temperature 97.6, pulse rate 62 per minute,      respirations 20, blood pressure 131/53, pulse oximetry 99% with room air.      GENERAL:  Well-developed, in no acute distress, alert, oriented.      HEENT:  Unremarkable.      SKIN:  Some dehydration.      NECK:  Supple, no neck stiffness.  Thyroid not palpable.  LUNGS:  Sounds clear to percussion and auscultation.                                   Page 1 of 4      [name], [name]      MRN:          [id number]      Account:      [id number]      Document ID:  [id number]                  HEART:  Regular rhythm, early grade I systolic murmur audible, but with an      occasional S4 gallop audible, but normal S1, S2, no friction or rub      audible.      ABDOMEN:  Soft.  No tenderness to palpation, no hepatosplenomegaly, no mass      palpable, no hernia.  Bowel sound was positive.  Normal bowel sounds.      EXTREMITIES:  No edema, negative Homans sign.      NEUROLOGIC:  There are no focal neurological abnormalities seen.            LABORATORY AND DIAGNOSTIC DATA:      Initial CBC, WBC count initially 22,630, hemoglobin 11.4, hematocrit 34.6,      platelet count 256,000.  Repeat CBC was performed, white blood cell count      was down after treatment, white blood cells 13,740, hemoglobin 9.8,      hematocrit 30.7; platelet count 219,000.  Initial electrolytes show sodium      140, potassium 4.1, chloride 102, CO2 20, BUN 32, creatinine 1.6, blood      sugar 76, calcium 8.6.  Repeat electrolytes was performed next following      days.  Sodium 140, potassium 3.7, chloride 107, CO2 23, BUN was 24,      creatinine 1.1, blood sugar 115, calcium 7.8.  Liver function test was      performed initially, total bilirubin was 2.3, ALK 418, ALT 248, AST 284.      Repeat liver function tests on second hospital day, total bilirubin  was      0.9, ALK 277, ALT 135, AST 133 which was improved.  Urinalysis showed      leukocyte esterase was large with a urinary tract infection secondary to      diarrhea, which was contaminated from the diarrhea.            Hepatitis profile was performed.  Hepatitis A antibody IgM was negative,      hepatitis B surface antigen negative, hepatitis B core antibody negative,      hepatitis C antibody was negative.  All serological studies were negative      of hepatitis  antibody antigen.  A chest x-ray was normal.  Repeat CBC:      White blood cell count 10,530; hemoglobin 9.8; hematocrit 30.8; platelet      count 207,000.  Hemoglobin A1c was 8.9, which was elevated.  GFR was over      60.  Urine culture was done which showed enterococcus gram-negative,      enterococcus growing over 100,000 colonies with the infection from      contamination from the diarrhea and stool occult blood was negative.      Abnormal liver function test, which was elevated secondary to Zocor and  metformin medication.            DIAGNOSES:      1.  Acute urinary tract infection, which was resolved.      2.  Acute gastroenteritis with probably viral gastroenteritis.      3.  Anemia, chronic.      4.  Prerenal azotemia with dehydration from diarrhea.      5.  Gastroesophageal reflux disease with reflux of acid.      6.  Type 2 diabetes mellitus, which was stable.      7.  Hypertension, stable.      8.  History of rheumatoid arthritis.      9.  History of congestive heart failure with diastolic dysfunction.                                   Page 2 of 4      [name], [name]      MRN:          [id number]      Account:      [id number]      Document ID:  [id number]                  10.  Both knees, 3 compartment osteoarthritis.      11.  Neurogenic bladder, improved.      12.  History of hypertension.      13.  Cervical spondylitis with disk space narrowing between C5 and C6.      14.  History of deep vein thrombosis status post inferior  vena cava filter      was placed in the past.      15.  Hyperlipidemia.      16.  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            RECOMMENDATIONS:      1.  Discontinue and hold Zocor and metformin treatment secondary to      medicine induced hepatitis.  The patient needs followup care by Dr. Dalbert Mayotte      as outpatient in his office in about 2 weeks with repeat liver function      test.  The patient was informed and advised.      2.  The patient also needs followup care by cardiologist, hematologist for      anemia, which was chronic and rheumatology for rheumatoid arthritis.      3.  Also recommended a diet, 1800-calorie diabetic and cardiac diet.            DISCHARGE MEDICATIONS:      As I mentioned above, discontinue Zocor and metformin for a one month and      then repeat liver function tests must be done as outpatient by her primary      care doctor.            DISCHARGE MEDICATIONS:      1.  Norvasc 10 mg p.o. daily.      2.  Vitamin C 1000 mg p.o. daily.      3.  Calcium citrate 2 tablets p.o. daily.      4.  Folate 1 mg p.o. daily.      5.  Lasix 40 mg p.o. daily.      6.  Neurontin 600 mg p.o. twice a day.      7.  Glucotrol 5 mg p.o.  daily.      8.  Apresoline 10 mg p.o. every 8 hours.      9.  Atarax 50 mg p.o. twice a day.      10.  Lantus 15 units subcutaneously at bedtime.      11.  Levaquin 250 mg p.o. daily for 10 more days.      12.  Magnesium oxide 400 mg p.o. daily.      13.  Methotrexate 5 mg p.o. every Monday.      14. Protonix 40 mg p.o. every morning.      15.  K-Dur potassium 40 mEq p.o. twice a day      16.  Prednisone 5 mg p.o. daily for rheumatoid arthritis.            HOSPITAL COURSE:      This patient was admitted to the [facility] telemetry unit with a diagnosis of      dehydration, acute urinary tract infection, type 2 diabetes mellitus, and      history of congestive heart failure.  The patient was placed on bed rest                                   Page 3 of 4      [name], [name]      MRN:          [id number]      Account:      [id number]      Document ID:  [id number]                  with oxygen 2 liter per minute by cannula.  The patient was started on      1800-calorie diabetic and cardiac diet.  The patient was started on Norvasc      and Lasix 40 mg p.o. daily for congestive heart failure and also the      patient was started on Levaquin 250 mg IV daily.  Consultation was obtained      with Dr. Dalbert Mayotte, gastroenterologist, because patient came in with viral      gastroenteritis with abnormal liver function test.  After careful      evaluation by Dr. Dalbert Mayotte, the impression and diagnosis was made as medicine      induced hepatitis including Zocor and anticholesterol medicine, statin, and      metformin for diabetes.  Therefore, the patient was discussed with Dr.      Dalbert Mayotte, discontinue and hold Zocor and metformin was made.  On this      regimen, repeat liver function test was performed, which shows liver      getting improved.  Liver enzymes were slowly going down.  Total bilirubin      was 0.5, which was normal.  However, the patient was doing well, no more      diarrhea and abdomen was feeling fine.  The patient wanted to go home and      clinically was markedly improved; therefore, the patient was discharged      today.            Also, patient recommended to take Levaquin 250 mg p.o. daily for 10 more      days and followup care by her primary care physician as outpatient.      However, on the day of discharge, the patient doing well and vital signs  were stable.  Temperature 98.1, pulse rate 72 per minute, respirations 20,      blood pressure 134/66, pulse oximetry 100% with room air.  The patient was      discharged home with good condition.  The patient very happy to be      discharged home today for outpatient followup care by Dr. Dalbert Mayotte and also      Dr. Loma Newton cardiologist, also Dr. Nedra Hai, hematologist, for chronic anemia.  On      the day of discharge, the patient  doing well.                                    D:  08/01/2010 15:39 PM by Dr. Mel Almond, MD [phone])      T:  08/02/2010 16:42 PM by VO                        cc:                                   Page 4 of 4      Authenticated by Mel Almond, MD [phone]) On 08/06/2010 11:57:22 AM

## 2011-03-17 NOTE — Consults (Signed)
UNIQUE, [name]      MRN:          [id number]      Account:      [id number]      Document ID:  [id number]      Service Date: 11/16/2010            Admit Date: 11/14/2010            Patient Location: [id number]      Patient Type: I            CONSULTING PHYSICIAN: Miranda Litten MD            REFERRING PHYSICIAN: Mel Almond MD                  CONSULTING SERVICE:      Cardiology.            HISTORY OF PRESENT ILLNESS:      The patient is a very pleasant 77 year old very alert female, who presented      to the hospital for evaluation of facial swelling.  According to the      patient, she felt she was given medications for blood pressure and      developed a possible allergic reaction.  She is also being seen by      nephrology, who felt that she had acute renal injury.  Currently, the      fascial swelling has resolved itself.  She is in no acute distress.  She      denied any respiratory difficulty.  She lives in an assisted living      facility at [facility].            PAST MEDICAL HISTORY:      Heart failure in the past, hyperlipidemia, elevated cholesterol, she had a      colectomy in the past, history of orthopedic surgery.  No alcohol abuse.            MEDICATIONS:      Furosemide 40 mg daily, hydralazine 10 mg three times a day, Novolin      sliding scale, Norvasc 10 mg daily, folic acid, Os-Cal 500, aspirin 81 mg      daily, Metformin 500 mg daily, also Lantus insulin, she also is on      prednisone, she takes Lipitor 20 mg daily, potassium 20 mEq daily, Protonix      40 mg daily, Caduet 20 mg daily, and she was on lisinopril 10 mg daily.            REVIEW OF SYSTEMS:      The patient denied any PND, orthopnea. No ankle edema. No dizziness or      lightheadedness.  Denied any chest discomfort.            PHYSICAL EXAMINATION:      GENERAL:  She is sitting in a chair.  She is alert to person, place and      time.  Very alert for a 77 year old.  She drives her own car, according to       patient.      VITAL SIGNS:  Blood pressure 178/67, heart rate 58 and regular,      respirations 20 per minute.  She is afebrile at 97.4, pulse oximetry is      97%.  Page 1 of 2      [name]      MRN:          [id number]      Account:      [id number]      Document ID:  [id number]      Service Date: 11/16/2010            NECK:  There is no jugular neck vein distension at 45 degrees.      LUNGS:  Clear on physical exam.  Respirations unlabored.      HEART:  The heart rate is regular.  There is no peripheral edema.      ABDOMEN:  There is no abdominal tenderness.  No organomegaly.      NEUROLOGIC:  She is intact without focal neurological findings.            LABORATORY AND DIAGNOSTIC DATA:      White count 91.1, hemoglobin 9.4 with hematocrit 29.3, platelet count 233.      Sodium 147, potassium 4.3, BUN 22 with a creatinine 0.9.  Her admission      creatinine was 2.4 with a BUN at 38.  BUN and creatinine are now normal.            ASSESSMENT:      1. The patient had acute renal insufficiency, now resolved.      2.  Facial swelling of Miranda etiology.  Could have been secondary to the      lisinopril; however, she has been taking a medication for a long time      without difficulties, but I would take her off of it      3. Type 2 diabetes mellitus.      4.  Essential hypertension.      5.  Status post DVT.  She has an IVC filter in place.            RECOMMENDATIONS:      Would stop the lisinopril unless otherwise suggested by nephrology.  No new      medications I would add.  The patient has been ectopy free, no evidence of      heart block.                        Electronic Signing Provider            D:  11/16/2010 11:18 AM by Dr. Janace Litten, MD [phone])      T:  11/16/2010 12:05 PM by [id number]                  cc:                                   Page 2 of 2      Authenticated by Miranda Litten, MD [phone]) On 12/09/2010 78:46:96 AM

## 2011-03-17 NOTE — Consults (Signed)
[name], [name]      MRN:          [id number]      Account:      [id number]      Document ID:  [id number]      Service Date: 11/15/2010            Admit Date: 11/14/2010            Patient Location: [id number]      Patient Type: I            CONSULTING PHYSICIAN: Ilsa Iha MD            REFERRING PHYSICIAN: Mel Almond MD                  CONSULTING SERVICE:      Nephrology.            REASON FOR CONSULTATION:      Acute kidney injury.            IMPRESSION AND SUGGESTIONS:      This is a 77 year old African American woman:      1.  Acute kidney injury.      2.  Stage III chronic kidney disease.      3.  Hyperkalemia.      4.  Metabolic acidosis.      5.  Type 2 diabetes mellitus.      6.  Hypertension.      7.  Rheumatoid arthritis/osteoarthritis; recent diagnosis of compression      fracture.      8.  Anemia.      9.  Transaminitis.            DISCUSSION:      The patient is admitted with facial swelling and 1 week of poor oral intake      along with 2 days of nausea.  I suspect she has acute kidney injury which      is prerenal, given this history and improvement with intravenous fluids.  I      would resume normal saline at 50 mL per hour overnight and monitor serial      chemistries.  I will also check urine studies, renal ultrasound, iron      studies, and parathyroid hormone level.  Agree with holding Lasix,      methotrexate, potassium supplement and lisinopril as you are doing.  The      patient ran out of her ACE inhibitor 1 week ago, thus, I doubt that this is      the source of her facial swelling, which is only a day or so old.            I appreciate the opportunity to participate in the care of this patient.            Thank you, Dr. Jaynie Collins, for the consult.  I will being following with you over      the duration of hospitalization.                                         Page 1 of 3      [name], [name]      MRN:          [id number]      Account:      [id number]      Document  ID:  [id number]       Service Date: 11/15/2010            HISTORY OF PRESENT ILLNESS:      Patient is a 77 year old African woman with a history of type 2 diabetes      mellitus, hypertension, rheumatoid arthritis, osteoarthritis, and left      ventricular diastolic dysfunction, who presents to the hospital because of      the facial swelling.  She was noted to have acute kidney injury and is now      admitted to the hospital.            We have been asked to evaluate the patient because of abnormal kidney      function.  The serum creatinine is normal at baseline, represents stage III      chronic kidney disease at baseline with a value of 0.8 mg/dL from April.      She had a BUN of 38 and creatinine 2.4 on admission and these numbers have      improved to 32 and 1.5 respectively following overnight infusion of IV      fluid.  She had a planned urinalysis from April and none from this      admission.  Her last imaging of her kidneys was a CAT scan from August 2011      with no evidence of anatomic abnormalities.  She does have a history of a      neurogenic bladder and incontinence.            PAST MEDICAL AND SURGICAL HISTORY:      Significant for type 2 diabetes mellitus and hypertension, longstanding.      She has rheumatoid arthritis and osteoarthritis with an old compression      fracture noted on imaging at this hospitalization at L5 and a new      compression fracture noted at L4.  She has had a prior left hip      replacement.  There is a history of left ventricular diastolic dysfunction.       She has had diverticula noted on prior imaging.  She has history of DVT      and IVC filter.            ALLERGIES:      DILAUDID, CLARITHROMYCIN, IODINE and AZITHROMYCIN.            MEDICATIONS:      Benadryl, Pepcid, Neurontin 600 mg given x1 which she was taking twice a      day at home, sliding scale insulin, normal saline was previously infused at      60 mL per hour, currently not receiving any.  She received Solu-Medrol 125       mg IV x1.            Some of the from home that have been held include Lasix 40 mg daily,      methotrexate, which she has been off for 3 weeks, metformin, potassium 10      mEq per day, lisinopril 10 mg per day which she has been off for the last      week.            SOCIAL HISTORY:      She is originally from [place], but has been living in this area,      longstanding.  She worked as a Facilities manager.  She has been widowed for  greater      than 40 years.  She has no history of tobacco or alcohol use.  She has no      immediate family, though she does have some nieces and nephews that are                                   Page 2 of 3      [name], [name]      MRN:          [id number]      Account:      [id number]      Document ID:  [id number]      Service Date: 11/15/2010            involved in her life.            REVIEW OF SYSTEMS:      She denies any fever, chills, shortness of breath, chest pain,      palpitations.  She has had nausea, without vomiting.  Prior to this last      week, her appetite was fine though it has been somewhat  depressed for the      last 7 days.  She has no abdominal discomfort, lower extremity swelling,      PND, orthopnea, rash, dysuria, hematuria, or change in her frequency.  She      has had back pain and is noted to have old and new compression fractures of      the L-spine.            PHYSICAL EXAMINATION:      VITAL SIGNS:  Blood pressure 153/71, pulse 75 and regular, respiratory rate      is 20, oxygenation 98% on room air.  She is afebrile.  Blood glucoses 338      following her dose of IV steroids, weight is 61.2 kg.  Intake has been 1000      mL and output has been 500 mL.      GENERAL:  This is a fairly well-appearing lucid woman who looks younger      than her stated age.      HEENT:  She has anicteric sclerae, PERRLA, EOMI.  Normal oral mucosa.      CHEST:  She is clear to auscultation in lung fields.      CARDIOVASCULAR:  She has a regular rate and rhythm with no  murmurs, rubs,      or gallops.      ABDOMEN:  Normoactive bowel sounds.  No tenderness, tension or rigidity.      EXTREMITIES:  No peripheral edema.  All 4 extremities are symmetric.      NEUROLOGIC:  She is alert and oriented x3.  Grossly normal nonfocal exam.            LABORATORY AND DIAGNOSTIC DATA:      Sodium is 142, potassium 5.1, chloride 107, bicarbonate 22, BUN 32,      creatinine 1.5, glucose 286, calcium 8.5.  Protein 7.8, albumin 3.5, AST      49, ALT 27, alkaline phosphatase 236, bilirubin 0.5.  White blood cell      count 6500, hemoglobin 9.7; platelets 242,000.  INR 1.2.                        Electronic Signing Provider  D:  11/15/2010 21:29 PM by Dr. [place] ([id number])      T:  11/15/2010 23:15 PM by [id number]                  cc:                                   Page 3 of 3      Authenticated [place] MD ([id number]) On 11/16/2010 08:49:56 AM

## 2011-05-22 ENCOUNTER — Inpatient Hospital Stay
Admission: EM | Admit: 2011-05-22 | Disposition: A | Discharge status: Home / Self Care | Payer: Self-pay | Source: Ambulatory Visit | Attending: Family Medicine | Admitting: Family Medicine

## 2011-05-22 LAB — URINALYSIS, REFLEX TO MICROSCOPIC EXAM IF INDICATED
Bilirubin, UA: NEGATIVE
Glucose, UA: NEGATIVE
Ketones UA: NEGATIVE
Nitrite, UA: NEGATIVE
Protein, UR: 30 — AB
Specific Gravity UA POCT: 1.01 (ref 1.001–1.035)
Urine pH: 6 (ref 5.0–8.0)
Urobilinogen, UA: NORMAL mg/dL

## 2011-05-22 LAB — CBC AND DIFFERENTIAL
Hematocrit: 34.1 % — ABNORMAL LOW (ref 37.0–47.0)
Hgb: 10.9 g/dL — ABNORMAL LOW (ref 12.0–16.0)
MCH: 30.9 pg (ref 28.0–32.0)
MCHC: 32 g/dL (ref 32.0–36.0)
MCV: 96.6 fL (ref 80.0–100.0)
MPV: 11.6 fL (ref 9.4–12.3)
Platelets: 222 10*3/uL (ref 140–400)
RBC: 3.53 10*6/uL — ABNORMAL LOW (ref 4.20–5.40)
RDW: 15 % (ref 12–15)
WBC: 17.83 10*3/uL — ABNORMAL HIGH (ref 3.50–10.80)

## 2011-05-22 LAB — MAN DIFF ONLY
Band Neutrophils Absolute: 1.43 10*3/uL — ABNORMAL HIGH (ref 0.00–1.00)
Band Neutrophils: 8 % (ref 0–9)
Basophils Absolute Manual: 0 10*3/uL (ref 0.00–0.20)
Basophils Manual: 0 % (ref 0–2)
Eosinophils Absolute Manual: 0 10*3/uL (ref 0.00–0.70)
Eosinophils Manual: 0 % (ref 0–5)
Lymphocytes Absolute Manual: 0.71 10*3/uL (ref 0.50–4.40)
Lymphocytes Manual: 4 % — ABNORMAL LOW (ref 15–41)
Monocytes Absolute: 0.18 10*3/uL (ref 0.00–1.20)
Monocytes Manual: 1 % (ref 1–11)
Neutrophils Absolute Manual: 15.51 10*3/uL — ABNORMAL HIGH (ref 1.80–8.10)
Nucleated RBC: 0 /100 WBC
Segmented Neutrophils: 87 % — ABNORMAL HIGH (ref 52–75)

## 2011-05-22 LAB — CELL MORPHOLOGY: Cell Morphology: ABNORMAL — AB

## 2011-05-22 LAB — COMPREHENSIVE METABOLIC PANEL
ALT: 89 U/L — ABNORMAL HIGH (ref 21–72)
AST (SGOT): 127 U/L — ABNORMAL HIGH (ref 8–39)
Albumin/Globulin Ratio: 0.8 — ABNORMAL LOW (ref 1.1–1.8)
Albumin: 3.8 g/dL (ref 3.7–5.1)
Alkaline Phosphatase: 331 U/L — ABNORMAL HIGH (ref 43–122)
BUN: 32 mg/dL — ABNORMAL HIGH (ref 7–21)
Bilirubin, Total: 0.6 mg/dL (ref 0.2–1.3)
CO2: 26 mEq/L (ref 22–31)
Calcium: 9.2 mg/dL (ref 8.6–10.2)
Chloride: 105 mEq/L (ref 98–107)
Creatinine: 1.3 mg/dL (ref 0.5–1.4)
Globulin: 4.8 g/dL — ABNORMAL HIGH (ref 2.0–3.7)
Glucose: 80 mg/dL (ref 70–100)
Potassium: 3.9 mEq/L (ref 3.6–5.0)
Protein, Total: 8.6 g/dL — ABNORMAL HIGH (ref 6.0–8.0)
Sodium: 142 mEq/L (ref 136–143)

## 2011-05-22 LAB — CKMB: Creatinine Kinase MB (CKMB): 4.9 ng/mL (ref 0.0–4.9)

## 2011-05-22 LAB — TROPONIN I: Troponin I: <0.01 ng/mL (ref 0.00–0.03)

## 2011-05-22 LAB — MAGNESIUM: Magnesium: 1.4 mg/dL — ABNORMAL LOW (ref 1.6–2.3)

## 2011-05-22 LAB — GFR: EGFR: 46.2

## 2011-05-22 LAB — CK: Creatine Kinase (CK): 391 U/L — ABNORMAL HIGH (ref 20–140)

## 2011-05-23 LAB — CBC AND DIFFERENTIAL
Basophils Absolute Automated: 0.03 10*3/uL (ref 0.00–0.20)
Basophils Automated: 0 % (ref 0–2)
Eosinophils Absolute Automated: 0.1 10*3/uL (ref 0.00–0.70)
Eosinophils Automated: 1 % (ref 0–5)
Hematocrit: 27.3 % — ABNORMAL LOW (ref 37.0–47.0)
Hgb: 8.8 g/dL — ABNORMAL LOW (ref 12.0–16.0)
Immature Granulocytes Absolute: 0.03 10*3/uL
Immature Granulocytes: 0 % (ref 0–1)
Lymphocytes Absolute Automated: 1.37 10*3/uL (ref 0.50–4.40)
Lymphocytes Automated: 12 % — ABNORMAL LOW (ref 15–41)
MCH: 30.7 pg (ref 28.0–32.0)
MCHC: 32.2 g/dL (ref 32.0–36.0)
MCV: 95.1 fL (ref 80.0–100.0)
MPV: 10.7 fL (ref 9.4–12.3)
Monocytes Absolute Automated: 0.95 10*3/uL (ref 0.00–1.20)
Monocytes: 8 % (ref 0–11)
Neutrophils Absolute: 9.48 10*3/uL — ABNORMAL HIGH (ref 1.80–8.10)
Neutrophils: 79 % — ABNORMAL HIGH (ref 52–75)
Nucleated RBC: 0 /100 WBC
Platelets: 156 10*3/uL (ref 140–400)
RBC: 2.87 10*6/uL — ABNORMAL LOW (ref 4.20–5.40)
RDW: 15 % (ref 12–15)
WBC: 11.93 10*3/uL — ABNORMAL HIGH (ref 3.50–10.80)

## 2011-05-23 LAB — MAGNESIUM: Magnesium: 1.7 mg/dL (ref 1.6–2.3)

## 2011-05-23 LAB — COMPREHENSIVE METABOLIC PANEL
ALT: 74 U/L — ABNORMAL HIGH (ref 21–72)
AST (SGOT): 103 U/L — ABNORMAL HIGH (ref 8–39)
Albumin/Globulin Ratio: 0.7 — ABNORMAL LOW (ref 1.1–1.8)
Albumin: 2.4 g/dL — ABNORMAL LOW (ref 3.7–5.1)
Alkaline Phosphatase: 219 U/L — ABNORMAL HIGH (ref 43–122)
BUN: 25 mg/dL — ABNORMAL HIGH (ref 7–21)
Bilirubin, Total: 0.6 mg/dL (ref 0.2–1.3)
CO2: 25 mEq/L (ref 22–31)
Calcium: 8 mg/dL — ABNORMAL LOW (ref 8.6–10.2)
Chloride: 107 mEq/L (ref 98–107)
Creatinine: 1.1 mg/dL (ref 0.5–1.4)
Globulin: 3.6 g/dL (ref 2.0–3.7)
Glucose: 113 mg/dL — ABNORMAL HIGH (ref 70–100)
Potassium: 3.9 mEq/L (ref 3.6–5.0)
Protein, Total: 6 g/dL (ref 6.0–8.0)
Sodium: 138 mEq/L (ref 136–143)

## 2011-05-23 LAB — GFR: EGFR: 56

## 2011-05-23 LAB — TROPONIN I
Troponin I: 0.08 ng/mL — ABNORMAL HIGH (ref 0.00–0.03)
Troponin I: 0.07 ng/mL — ABNORMAL HIGH (ref 0.00–0.03)
Troponin I: 0.15 ng/mL (ref 0.00–0.03)

## 2011-05-23 LAB — CK: Creatine Kinase (CK): 1131 U/L — ABNORMAL HIGH (ref 20–140)

## 2011-05-23 LAB — CKMB: Creatinine Kinase MB (CKMB): 14.2 ng/mL — ABNORMAL HIGH (ref 0.0–4.9)

## 2011-05-24 LAB — CBC AND DIFFERENTIAL
Basophils Absolute Automated: 0.03 10*3/uL (ref 0.00–0.20)
Basophils Automated: 0 % (ref 0–2)
Eosinophils Absolute Automated: 0.24 10*3/uL (ref 0.00–0.70)
Eosinophils Automated: 3 % (ref 0–5)
Hematocrit: 29.5 % — ABNORMAL LOW (ref 37.0–47.0)
Hgb: 9.6 g/dL — ABNORMAL LOW (ref 12.0–16.0)
Immature Granulocytes Absolute: 0.03 10*3/uL
Immature Granulocytes: 0 % (ref 0–1)
Lymphocytes Absolute Automated: 1.75 10*3/uL (ref 0.50–4.40)
Lymphocytes Automated: 20 % (ref 15–41)
MCH: 30.6 pg (ref 28.0–32.0)
MCHC: 32.5 g/dL (ref 32.0–36.0)
MCV: 93.9 fL (ref 80.0–100.0)
MPV: 11.5 fL (ref 9.4–12.3)
Monocytes Absolute Automated: 1.45 10*3/uL — ABNORMAL HIGH (ref 0.00–1.20)
Monocytes: 16 % — ABNORMAL HIGH (ref 0–11)
Neutrophils Absolute: 5.48 10*3/uL (ref 1.80–8.10)
Neutrophils: 61 % (ref 52–75)
Nucleated RBC: 0 /100 WBC
Platelets: 163 10*3/uL (ref 140–400)
RBC: 3.14 10*6/uL — ABNORMAL LOW (ref 4.20–5.40)
RDW: 16 % — ABNORMAL HIGH (ref 12–15)
WBC: 8.95 10*3/uL (ref 3.50–10.80)

## 2011-05-24 LAB — COMPREHENSIVE METABOLIC PANEL
ALT: 77 U/L — ABNORMAL HIGH (ref 21–72)
AST (SGOT): 92 U/L — ABNORMAL HIGH (ref 8–39)
Albumin/Globulin Ratio: 0.7 — ABNORMAL LOW (ref 1.1–1.8)
Albumin: 2.6 g/dL — ABNORMAL LOW (ref 3.7–5.1)
Alkaline Phosphatase: 260 U/L — ABNORMAL HIGH (ref 43–122)
BUN: 12 mg/dL (ref 7–21)
Bilirubin, Total: 0.5 mg/dL (ref 0.2–1.3)
CO2: 25 mEq/L (ref 22–31)
Calcium: 8.2 mg/dL — ABNORMAL LOW (ref 8.6–10.2)
Chloride: 112 mEq/L — ABNORMAL HIGH (ref 98–107)
Creatinine: 0.8 mg/dL (ref 0.5–1.4)
Globulin: 3.7 g/dL (ref 2.0–3.7)
Glucose: 127 mg/dL — ABNORMAL HIGH (ref 70–100)
Potassium: 3.4 mEq/L — ABNORMAL LOW (ref 3.6–5.0)
Protein, Total: 6.3 g/dL (ref 6.0–8.0)
Sodium: 142 mEq/L (ref 136–143)

## 2011-05-24 LAB — CK
Creatine Kinase (CK): 525 U/L — ABNORMAL HIGH (ref 20–140)
Creatine Kinase (CK): 474 U/L — ABNORMAL HIGH (ref 20–140)
Creatine Kinase (CK): 380 U/L — ABNORMAL HIGH (ref 20–140)

## 2011-05-24 LAB — TROPONIN I
Troponin I: 0.04 ng/mL — ABNORMAL HIGH (ref 0.00–0.03)
Troponin I: 0.03 ng/mL (ref 0.00–0.03)

## 2011-05-24 LAB — GFR: EGFR: >60

## 2011-05-24 LAB — ECG 12-LEAD
Atrial Rate: 70 {beats}/min
P Axis: 36 degrees
P-R Interval: 162 ms
Q-T Interval: 414 ms
QRS Duration: 90 ms
QTC Calculation (Bezet): 447 ms
R Axis: -20 degrees
T Axis: 10 degrees
Ventricular Rate: 70 {beats}/min

## 2011-05-24 LAB — CKMB
Creatinine Kinase MB (CKMB): 3.3 ng/mL (ref 0.0–4.9)
Creatinine Kinase MB (CKMB): 2.9 ng/mL (ref 0.0–4.9)

## 2011-05-25 LAB — CBC AND DIFFERENTIAL
Basophils Absolute Automated: 0.01 10*3/uL (ref 0.00–0.20)
Basophils Automated: 0 % (ref 0–2)
Eosinophils Absolute Automated: 0.27 10*3/uL (ref 0.00–0.70)
Eosinophils Automated: 3 % (ref 0–5)
Hematocrit: 31.1 % — ABNORMAL LOW (ref 37.0–47.0)
Hgb: 10 g/dL — ABNORMAL LOW (ref 12.0–16.0)
Immature Granulocytes Absolute: 0.02 10*3/uL
Immature Granulocytes: 0 % (ref 0–1)
Lymphocytes Absolute Automated: 2.43 10*3/uL (ref 0.50–4.40)
Lymphocytes Automated: 27 % (ref 15–41)
MCH: 29.9 pg (ref 28.0–32.0)
MCHC: 32.2 g/dL (ref 32.0–36.0)
MCV: 93.1 fL (ref 80.0–100.0)
MPV: 12 fL (ref 9.4–12.3)
Monocytes Absolute Automated: 1.14 10*3/uL (ref 0.00–1.20)
Monocytes: 13 % — ABNORMAL HIGH (ref 0–11)
Neutrophils Absolute: 5.1 10*3/uL (ref 1.80–8.10)
Neutrophils: 57 % (ref 52–75)
Nucleated RBC: 0 /100 WBC
Platelets: 198 10*3/uL (ref 140–400)
RBC: 3.34 10*6/uL — ABNORMAL LOW (ref 4.20–5.40)
RDW: 15 % (ref 12–15)
WBC: 8.95 10*3/uL (ref 3.50–10.80)

## 2011-05-25 LAB — COMPREHENSIVE METABOLIC PANEL
ALT: 91 U/L — ABNORMAL HIGH (ref 21–72)
AST (SGOT): 109 U/L — ABNORMAL HIGH (ref 8–39)
Albumin/Globulin Ratio: 0.7 — ABNORMAL LOW (ref 1.1–1.8)
Albumin: 3 g/dL — ABNORMAL LOW (ref 3.7–5.1)
Alkaline Phosphatase: 309 U/L — ABNORMAL HIGH (ref 43–122)
BUN: 10 mg/dL (ref 7–21)
Bilirubin, Total: 0.5 mg/dL (ref 0.2–1.3)
CO2: 25 mEq/L (ref 22–31)
Calcium: 8.8 mg/dL (ref 8.6–10.2)
Chloride: 110 mEq/L — ABNORMAL HIGH (ref 98–107)
Creatinine: 0.8 mg/dL (ref 0.5–1.4)
Globulin: 4.2 g/dL — ABNORMAL HIGH (ref 2.0–3.7)
Glucose: 48 mg/dL — ABNORMAL LOW (ref 70–100)
Potassium: 3.8 mEq/L (ref 3.6–5.0)
Protein, Total: 7.2 g/dL (ref 6.0–8.0)
Sodium: 143 mEq/L (ref 136–143)

## 2011-05-25 LAB — CKMB
Creatinine Kinase MB (CKMB): 2.7 ng/mL (ref 0.0–4.9)
Creatinine Kinase MB (CKMB): 2.7 ng/mL (ref 0.0–4.9)

## 2011-05-25 LAB — TROPONIN I: Troponin I: 0.04 ng/mL — ABNORMAL HIGH (ref 0.00–0.03)

## 2011-05-25 LAB — GFR: EGFR: >60

## 2011-05-25 LAB — CK: Creatine Kinase (CK): 323 U/L — ABNORMAL HIGH (ref 20–140)

## 2011-05-29 NOTE — Discharge Summary (Signed)
[name], [name]                                          MRN:          [id number]                                                          Account:      [id number]                                         Document ID:  [id number]                                                                                                                                        Admit Date: 05/22/2011  Discharge Date: 05/25/2011     ATTENDING PHYSICIAN:  Thereasa Parkin, MD        REASON FOR ADMISSION:  Generalized weakness.     DISCHARGE DIAGNOSES:  1.  Rhabdomyolysis.  2.  Urinary tract infection with Escherichia coli.  3.  Elevated troponin secondary to demand ischemia.     PAST MEDICAL HISTORY:  Dyslipidemia, type 2 diabetes controlled, history of coronary artery  disease, history of DVT status post IVC filter, degenerative joint disease,  rheumatoid arthritis on prednisone, hypertension, history of CHF, GERD.     HISTORY OF PRESENT ILLNESS:  A 78 year old female who resides in assisted living facility that after  sitting on the commode became weak and could not get out of the chair.  She  remained on the bedside commode all night because she was unable to call  for help.  In the morning, she was sent to the hospital for further  evaluation.  Workup showed elevated CPK and troponins.     HOSPITAL COURSE:  The patient was admitted to the telemetry floor and was seen by cardiology  and had serial checks of her transaminases and CK.  Cardiology ruled out  any acute coronary syndrome and no further intervention was recommended.  She remained chest pain free and mainly asymptomatic throughout the whole  hospital course.  Her CPK also trended down nicely with IV hydration.  No  bicarbonate was necessary.  She still remains weak, most likely related to  her urinary tract infection that was confirmed on a culture with E. coli  that was pansensitive.  The patient was on ceftriaxone in the hospital.  She has not had any fevers since  admission and her blood work was  unremarkable.  She had 1 out of 2 of her blood cultures positive with  possible contaminant species.     PHYSICAL EXAMINATION ON DISCHARGE:  VITAL SIGNS:  Oral temperature of 97.7, pulse 69, blood pressure 186/72,  breathing 99% on room air, respirations of 18.  GENERAL:  Pleasant, elderly female, weak and frail.  HEAD AND NECK:  No JVD, no LAD.  Neck is supple.  Head is atraumatic.  Oral  examination is unremarkable.                                                                                                           Page 1 of 2  [name], [name]                                          MRN:          [id number]                                                          Account:      [id number]                                         Document ID:  [id number]                                                                                                                                        CARDIOVASCULAR:  S1, S2.  No murmurs, rubs, or gallops.  LUNGS:  Clear to auscultation bilaterally.  There are some minimal crackles  on the bases.  ABDOMEN:  Soft, nontender.  No guarding or rebound tenderness.  EXTREMITIES:  No clubbing, cyanosis, or edema.  NEUROLOGIC:  Alert, oriented x3, grossly nonfocal.     PERTINENT DIAGNOSTIC DATA:  Initial white count of 17,000; currently is 8000; hematocrit of 31.1;  platelets of 198.  Sodium of 143, potassium 3.8, BUN of 10, creatinine of  0.8, calcium of 8.8.  Her CPK initially was 391, peaked at 1131, currently  at 323.  Cardiac markers:  Troponin of less  than 0.01, peaked at 0.15,  currently at 0.04.  Urinalysis:  Large amount of leukocyte esterase, too  many to count WBCs.  Final culture positive for E. coli, pansensitive, only  resistant to Levaquin, tetracycline, sulfa, and Cipro.  Chest x-ray on  admission showed no acute pulmonary infiltrate.     DISCHARGE INSTRUCTIONS:  Discharge back to assisted living facility with recommendations to  follow  by physical therapy and occupational therapy.  Finish 5-day antibiotic  course for UTI.  Continue home medications.  Follow up with primary care  provider in 1 to 2 weeks.  Encourage oral intake.  The patient is DNR, DNI.        CONDITION ON DISCHARGE:  Improving.     ACTIVITY:  As tolerated.     DIET:  Cardiac.                 D:  05/25/2011 11:51 AM by Dr. Pearletha Forge, MD ([id number])  T:  05/25/2011 14:27 PM by [id number]           cc:                                                                                                           Page 2 of 2  Authenticated by Pearletha Forge, MD ([id number]) On 05/29/2011 08:22:32 AM

## 2011-05-29 NOTE — H&P (Signed)
[name], [name]                                          MRN:          [id number]                                                          Account:      [id number]                                         Document ID:  [id number]                                                                                                                                        Admit Date: 05/22/2011     Patient Location: [id number]  Patient Type: I     ATTENDING PHYSICIAN: Thereasa Parkin, MD        PRIMARY CARE PROVIDER:  Alease Medina MD     CHIEF COMPLAINT:  Fatigue and chills.     HISTORY OF PRESENT ILLNESS:  This is a 78 year old female who resides at assisted living facility at  [facility] nursing home that comes in with acute onset of weakness  starting Monday night.  The patient states that when she was getting ready  to go to bed she got dressed and then sat on the stool but could not get  out, felt very weak, had chills, and she stayed on the stool all night.  She was so weak she could not lift her arms to call the alarm.  Apparently,  she was on the stool until the next morning when the staff from the  assisted living facility found her.  She was sent immediately to the ER.  In the emergency department, workup shows urinalysis suggestive of UTI.  The remainder of the workup shows elevated white count.  She was started on  antibiotics and admitted to the floor.  Repeat cardiac enzymes came back  elevated and her CPK bump up to 1131.  The patient has no history of recent  trauma, denies any recent sick contacts.     REVIEW OF SYSTEMS:  CONSTITUTIONAL:  Generalized weakness, chills, did not check for fever.  HEAD AND NECK:  No sore throat, no nasal congestion, no difficulty  swallowing.  CARDIOVASCULAR:  No chest pain, no palpitations, no orthopnea, no PND.  RESPIRATORY:  No shortness of breath, no cough.  GENITOURINARY:  No dysuria, no incontinence.  GASTROINTESTINAL:  No nausea, no vomiting, no diarrhea, no abdominal  pain,  no constipation.  MUSCULOSKELETAL:  Bilateral lower extremity pain that is chronic.  NEUROLOGIC:  No focal weakness, no headaches.  SKIN:  No rashes.  PSYCHIATRIC:  Normal affect.     PAST MEDICAL HISTORY:  1.  History of coronary artery disease without intervention.                                                                                                           Page 1 of 4  EWELINA, NAVES                                          MRN:          [id number]                                                          Account:      [id number]                                         Document ID:  [id number]                                                                                                                                        2.  Lipidemia.  3.  Type 2 diabetes.  4.  Rheumatoid arthritis on prednisone.  5.  History of DVT with placement of IVC filter.  6.  Degenerative joint disease.  7.  Hypertension.  8.  CHF.  9.  GERD.     PAST SURGICAL HISTORY:  Bilateral total hip replacement, cholecystectomy for necrotic gallbladder,  IVC filter placement.     ALLERGIES:  DILAUDID, CLARITHROMYCIN, IODINE, and AZITHROMYCIN.     MEDICATIONS:  Aspirin 81 mg p.o. daily, prednisone 5 mg p.o. daily, folic acid 1 mg p.o.  daily, furosemide 20 mg p.o. every other day, Protonix 40 mg p.o. daily,  hydroxyzine 50 mg p.o. daily, Novolin R sliding scale, Norvasc 5 mg p.o.  daily,  Vi-tab C 1000 mg p.o.  daily, Lantus 20 units every night, Os-Cal  500 mg p.o. daily, methotrexate 5 mg p.o. weekly, Citracal 2 tabs daily  p.o., magnesium oxide 400 mg p.o. daily, Lipitor 20 mg p.o. every night.     SOCIAL HISTORY:  Lives in assisted living facility.  She uses a cane and a walker.  She  never smoked, does not drink.  She is a DNR-DNI.  Her next of kin is her  niece.     FAMILY HISTORY:  Asked and noncontributory.     PHYSICAL EXAMINATION:  VITAL SIGNS:  Oral temperature of 97, pulse of 59, respirations of 17,  breathing  96% on room air, blood pressure 126/54.  GENERAL:  Frail, elderly female, very pleasant, in no apparent distress,  appearing very weak.  HEAD AND NECK:  No JVD, no LAD, no carotid bruit.  Head is atraumatic.  CARDIOVASCULAR:  S1, S2, no murmurs, rubs or gallops.  LUNGS:  Minimal crackles in bases, good air movement, no wheezing.  ABDOMEN:  Soft, nontender, no guarding or rebound tenderness.  No pulsatile  mass.  Positive bowel sounds.  EXTREMITIES:  No clubbing, cyanosis, or edema.  There is  metacarpophalangeal arthritis, severe, present on hand examination.  NEUROLOGIC:  Alert and oriented x3, no sensory deficits.  Motor                                                                                                           Page 2 of 4  CEYDA, PETERKA                                          MRN:          [id number]                                                          Account:      [id number]                                         Document ID:  [id number]  examination:  4/5 in all four extremities.  Speech is appropriate.  Gait  was not assessed.     DIAGNOSTIC DATA:  White count of 17, going down to 11,000; hemoglobin of 8.8, hematocrit of  27.3; platelets of 156.  Sodium 138, potassium 3.9, chloride 107, CO2 25,  BUN 25, creatinine 1.1, calcium 8, magnesium 1.7, albumin 2.4, total  bilirubin 0.6, alkaline phosphatase 219, AST 103, ALT 74.  CPK 1131, CK-MB  14.2, troponin 0.08, first set was negative.  Urinalysis with large amount  of leukocyte esterase, too many to count WBCs.  Urine culture pending.  Blood cultures pending.  EKG shows normal sinus rhythm, no ST changes.  Chest x-ray, by my interpretation, shows no acute infiltrates, no CHF.  CT  of the head in the emergency department showed chronic involutional  changes.     ASSESSMENT AND PLAN:  1.  Generalized  weakness, most likely due to a urinary tract infection.  The patient was started on Zosyn but she had a resistant strain of  Escherichia coli to Zosyn on a prior admission.  We will put her on  ceftriaxone and follow up identification susceptibility.  Her white count  is already trending down for her urinary tract infection.  2.  Rhabdomyolysis, most likely due to prolonged period of immobilization  on the stool all night.  The patient will be kept on aggressive IV  hydration.  I will not start on bicarbonate for now.  Will monitor CPK  closely.  Will hold Lipitor for now.  I do not think the statin was the  precipitant factor.  3.  Elevated troponin, most likely related to dement issue and not related  to an acute myocardial infarction, but given her history will consult  cardiology.  Continue aspirin.  4.  Type 2 diabetes.  Continue sliding scale, continue Lantus, continue  diabetic diet.  5.  Lipidemia.  Hold Lipitor because of rhabdomyolysis.  6.  Osteoarthritis and rheumatoid arthritis.  Continue prednisone and  Tylenol as needed for pain.  7.  Code status is Do Not Resuscitate, Do Not Intubate.  Discussed with the  patient.  8.  Prophylaxis:  Lovenox and proton pump inhibitor.                 D:  05/23/2011 09:28 AM by Dr. Pearletha Forge, MD ([id number])  T:  05/23/2011 10:12 AM by [id number]                                                                                                              Page 3 of 4  SHAN, PADGETT                                          MRN:          [id number]  Account:      [id number]                                         Document ID:  [id number]                                                                                                                                           cc:                                                                                                           Page 4 of 4  Authenticated by  Pearletha Forge, MD ([id number]) On 05/29/2011 08:22:15 AM

## 2011-06-26 NOTE — Consults (Signed)
[name], [name]                                      MRN:          [id number]                                                          Account:      [id number]                                         Document ID:  [id number]                                                   Service Date: 05/23/2011                                                                                    Admit Date: 05/22/2011     Patient Location: [id number]  Patient Type: I     CONSULTING PHYSICIAN: Hardie Lora MD     REFERRING PHYSICIAN: Okey Regal MD        CONSULTING SERVICE:  Cardiology.     CHIEF COMPLAINT:  The patient is seen in consultation at the request of Dr. Darene Lamer for cardiac  evaluation of abnormal troponin.     HISTORY OF PRESENT ILLNESS:  [name] is a 78 year old woman with a history of hypertension and  reported history of congestive heart failure.  The patient lives alone and  has had a very poor appetite over the past few days.  Last night she went  to the bathroom but was too weak to stand up from the commode.  Eventually  EMS was summoned.  Upon their arrival, she was noted to have a fever, but  no shortness of breath.  There was no chest pain.  She was admitted for  observation.  However, troponin is elevated at 0.03.  She does have  evidence of rhabdomyolysis.  Again, she denies chest pain and shortness of  breath.  Her main complaint is generalized weakness.     The patient has a reported history of congestive heart failure.  She also  has diabetes, hypertension, and hyperlipidemia.  She also has a reported  history of pancreatitis.     ALLERGIES:  DILAUDID, CLARITHROMYCIN, ERYTHROMYCIN an IODINE.     SOCIAL HISTORY:  She lives alone.  She does not smoke.  She does not drink alcohol.     FAMILY HISTORY:  The patient reports that her father had coronary artery disease.     REVIEW OF SYSTEMS:  GENERAL:  Positive weakness.  Denies fever.  NEUROLOGIC:  Denies  headache, denies syncope.  EYES:  Denies visual  changes.                                                                                                           Page 1 of 3  [name], [name]                                      MRN:          [id number]                                                          Account:      [id number]                                         Document ID:  [id number]                                                   Service Date: 05/23/2011                                                                                    ENT:  Negative for epistaxis.  ENDOCRINE:  Negative thyroid disease.  PULMONARY:  Denies shortness of breath, denies cough.  CARDIOVASCULAR:  Denies chest pain, denies palpitation.  GASTROINTESTINAL:  Denies abdominal pain, denies GI bleed.  GENITOURINARY:  Negative hematuria.  MUSCULOSKELETAL:  Positive for arthritis.  Negative falls.  All other systems are negative.     PHYSICAL EXAMINATION:  VITAL SIGNS:  Blood pressure is 126/54, heart rate of 59, respirations of  16, temperature 97.0.  GENERAL:  Weak and frail-appearing woman in no distress.  EYES:  Anicteric.  Moist oral mucosa.  Poor dentition.  NECK:  Negative JVD, negative carotid bruits.  LUNGS:  Clear.  Respiration is unlabored.  HEART:  Regular rate and rhythm with a II/VI holosystolic murmur.  No rubs.     ABDOMEN:  Abdomen is soft.  There is tenderness to palpation in the left  upper quadrant with guarding.  Bowel sounds are normal.  LOWER EXTREMITIES:  Show no edema.  Posterior tibial pulses are weak  bilaterally.  SKIN:  Warm and dry.  MUSCULOSKELETAL:  Positive for arthritic changes of both hands.  NEUROLOGIC:  Alert and oriented x3.  PSYCHIATRIC:  Mood and affect are appropriate.     Independent review of the EKG from yesterday at 2:55 p.m. shows a normal  sinus rhythm, age-undetermined anterior wall MI, no ischemic ST segment  changes.  There are nonspecific T-wave changes.     Chest x-ray shows no acute pulmonary infiltrates.     CT scan of  the head shows no acute intracerebral abnormality.     An MRI of the brain shows no acute infarct.     LABORATORY DATA:  White count of 11.9, hematocrit 27.3, platelets 156.  BUN of 25, creatinine  1.1, potassium 3.9.  CPK of 1131, troponin I #1 less than 0.01, #2 0.08.     DIAGNOSES:  1.  Abnormal troponin I.  Doubt acute coronary syndrome.  2.  Rhabdomyolysis.  3.  Fever and generalized weakness.                                                                                                           Page 2 of 3  [name], [name]                                      MRN:          [id number]                                                          Account:      [id number]                                         Document ID:  [id number]                                                   Service Date: 05/23/2011                                                                                    4.  Hypertension.  5.  Hyperlipidemia.  6.  Diabetes.  7.  Abdominal pain.     COMMENT:  [name] is a 78 year old woman who presents with generalized weakness  and was unable to get up from the commode where she sat for over 12 hours.  The patient has mild rhabdomyolysis as well as evidence of dehydration.  She has an elevated troponin I.  There is no clinical evidence of acute  coronary syndrome.  I recommend that we continue to observe her on  telemetry and check additional troponin I enzymes.  We will obtain an  echocardiogram to assess LV systolic and diastolic function, as well as to  look for regional wall motion abnormality.  Given her advanced age, I  recommend conservative approach.  We will add aspirin to the regimen.     We obtain an abdominal CT for workup of abdominal pain.     Thank you very much for the opportunity to participate in the care of this  patient, and our team will continue to follow her with you.           Electronic Signing Provider     D:  05/23/2011 12:00 PM by Dr. Hardie Lora, MD [phone])  T:   05/23/2011 12:35 PM by [id number]        cc:                                                                                                           Page 3 of 3  Authenticated by Marni Griffon, MD [phone]) On 06/26/2011 09:28:57 AM

## 2011-09-26 ENCOUNTER — Inpatient Hospital Stay
Admission: EM | Admit: 2011-09-26 | Disposition: A | Discharge status: Skilled Nursing Facility | Payer: Self-pay | Source: Ambulatory Visit | Attending: Internal Medicine | Admitting: Internal Medicine

## 2011-09-26 LAB — URINALYSIS, REFLEX TO MICROSCOPIC EXAM IF INDICATED
Bilirubin, UA: NEGATIVE
Blood, UA: NEGATIVE
Glucose, UA: NEGATIVE
Ketones UA: NEGATIVE
Leukocyte Esterase, UA: NEGATIVE
Nitrite, UA: NEGATIVE
Protein, UR: 30 — AB
Specific Gravity UA: 1.015 (ref 1.001–1.035)
Urine pH: 6 (ref 5.0–8.0)
Urobilinogen, UA: NORMAL mg/dL

## 2011-09-26 LAB — MAN DIFF ONLY
Band Neutrophils Absolute: 3.13 10*3/uL — ABNORMAL HIGH (ref 0.00–1.00)
Band Neutrophils: 17 % — ABNORMAL HIGH (ref 0–9)
Basophils Absolute Manual: 0.18 10*3/uL (ref 0.00–0.20)
Basophils Manual: 1 % (ref 0–2)
Eosinophils Absolute Manual: 0 10*3/uL (ref 0.00–0.70)
Eosinophils Manual: 0 % (ref 0–5)
Lymphocytes Absolute Manual: 0.74 10*3/uL (ref 0.50–4.40)
Lymphocytes Manual: 4 % — ABNORMAL LOW (ref 15–41)
Monocytes Absolute: 1.11 10*3/uL (ref 0.00–1.20)
Monocytes Manual: 6 % (ref 1–11)
Neutrophils Absolute Manual: 13.28 10*3/uL — ABNORMAL HIGH (ref 1.80–8.10)
Nucleated RBC: 0 /100 WBC (ref 0–1)
Segmented Neutrophils: 72 % (ref 52–75)

## 2011-09-26 LAB — COMPREHENSIVE METABOLIC PANEL
ALT: 151 U/L — ABNORMAL HIGH (ref 21–72)
AST (SGOT): 241 U/L — ABNORMAL HIGH (ref 8–39)
Albumin/Globulin Ratio: 0.8 — ABNORMAL LOW (ref 1.1–1.8)
Albumin: 3.1 g/dL — ABNORMAL LOW (ref 3.7–5.1)
Alkaline Phosphatase: 286 U/L — ABNORMAL HIGH (ref 43–122)
BUN: 26 mg/dL — ABNORMAL HIGH (ref 7–21)
Bilirubin, Total: 0.6 mg/dL (ref 0.2–1.3)
CO2: 23 mEq/L (ref 22–31)
Calcium: 8.5 mg/dL — ABNORMAL LOW (ref 8.6–10.2)
Chloride: 109 mEq/L — ABNORMAL HIGH (ref 98–107)
Creatinine: 1 mg/dL (ref 0.5–1.4)
Globulin: 3.9 g/dL — ABNORMAL HIGH (ref 2.0–3.7)
Glucose: 66 mg/dL — ABNORMAL LOW (ref 70–100)
Potassium: 3.6 mEq/L (ref 3.6–5.0)
Protein, Total: 7 g/dL (ref 6.0–8.0)
Sodium: 144 mEq/L — ABNORMAL HIGH (ref 136–143)

## 2011-09-26 LAB — CBC AND DIFFERENTIAL
Hematocrit: 30 % — ABNORMAL LOW (ref 37.0–47.0)
Hgb: 9.6 g/dL — ABNORMAL LOW (ref 12.0–16.0)
MCH: 30 pg (ref 28.0–32.0)
MCHC: 32 g/dL (ref 32.0–36.0)
MCV: 93.8 fL (ref 80.0–100.0)
MPV: 11.3 fL (ref 9.4–12.3)
Platelets: 176 10*3/uL (ref 140–400)
RBC: 3.2 10*6/uL — ABNORMAL LOW (ref 4.20–5.40)
RDW: 14 % (ref 12–15)
WBC: 18.44 10*3/uL — ABNORMAL HIGH (ref 3.50–10.80)

## 2011-09-26 LAB — TROPONIN I
Troponin I: 0.36 ng/mL (ref 0.00–0.03)
Troponin I: 0.22 ng/mL (ref 0.00–0.03)

## 2011-09-26 LAB — CKMB: Creatinine Kinase MB (CKMB): 1.5 ng/mL (ref 0.0–4.9)

## 2011-09-26 LAB — CK: Creatine Kinase (CK): 265 U/L — ABNORMAL HIGH (ref 20–140)

## 2011-09-26 LAB — GFR: EGFR: >60

## 2011-09-26 LAB — CELL MORPHOLOGY: Cell Morphology: NORMAL

## 2011-09-27 LAB — COMPREHENSIVE METABOLIC PANEL
ALT: 130 U/L — ABNORMAL HIGH (ref 21–72)
AST (SGOT): 167 U/L — ABNORMAL HIGH (ref 8–39)
Albumin/Globulin Ratio: 0.7 — ABNORMAL LOW (ref 1.1–1.8)
Albumin: 2.6 g/dL — ABNORMAL LOW (ref 3.7–5.1)
Alkaline Phosphatase: 242 U/L — ABNORMAL HIGH (ref 43–122)
BUN: 26 mg/dL — ABNORMAL HIGH (ref 7–21)
Bilirubin, Total: 0.5 mg/dL (ref 0.2–1.3)
CO2: 24 mEq/L (ref 22–31)
Calcium: 8.1 mg/dL — ABNORMAL LOW (ref 8.6–10.2)
Chloride: 108 mEq/L — ABNORMAL HIGH (ref 98–107)
Creatinine: 1 mg/dL (ref 0.5–1.4)
Globulin: 3.6 g/dL (ref 2.0–3.7)
Glucose: 87 mg/dL (ref 70–100)
Potassium: 3.7 mEq/L (ref 3.6–5.0)
Protein, Total: 6.2 g/dL (ref 6.0–8.0)
Sodium: 143 mEq/L (ref 136–143)

## 2011-09-27 LAB — CBC AND DIFFERENTIAL
Basophils Absolute Automated: 0.02 10*3/uL (ref 0.00–0.20)
Basophils Automated: 0 % (ref 0–2)
Eosinophils Absolute Automated: 0.05 10*3/uL (ref 0.00–0.70)
Eosinophils Automated: 0 % (ref 0–5)
Hematocrit: 29.2 % — ABNORMAL LOW (ref 37.0–47.0)
Hgb: 9.5 g/dL — ABNORMAL LOW (ref 12.0–16.0)
Immature Granulocytes Absolute: 0.25 10*3/uL — ABNORMAL HIGH
Immature Granulocytes: 2 % — ABNORMAL HIGH (ref 0–1)
Lymphocytes Absolute Automated: 0.87 10*3/uL (ref 0.50–4.40)
Lymphocytes Automated: 5 % — ABNORMAL LOW (ref 15–41)
MCH: 30.2 pg (ref 28.0–32.0)
MCHC: 32.5 g/dL (ref 32.0–36.0)
MCV: 92.7 fL (ref 80.0–100.0)
MPV: 11.3 fL (ref 9.4–12.3)
Monocytes Absolute Automated: 1.36 10*3/uL — ABNORMAL HIGH (ref 0.00–1.20)
Monocytes: 8 % (ref 0–11)
Neutrophils Absolute: 14.93 10*3/uL — ABNORMAL HIGH (ref 1.80–8.10)
Neutrophils: 87 % — ABNORMAL HIGH (ref 52–75)
Nucleated RBC: 0 /100 WBC (ref 0–1)
Platelets: 155 10*3/uL (ref 140–400)
RBC: 3.15 10*6/uL — ABNORMAL LOW (ref 4.20–5.40)
RDW: 15 % (ref 12–15)
WBC: 17.23 10*3/uL — ABNORMAL HIGH (ref 3.50–10.80)

## 2011-09-27 LAB — HEMOLYSIS INDEX: Hemolysis Index: 1 Index (ref 0–9)

## 2011-09-27 LAB — LIPID PANEL
Cholesterol / HDL Ratio: 5.6 Index
Cholesterol: 100 mg/dL (ref 0–199)
HDL: 18 mg/dL — ABNORMAL LOW (ref >40)
LDL Calculated: 62 mg/dL (ref 0–99)
Triglycerides: 102 mg/dL (ref 34–149)
VLDL Calculated: 20 mg/dL (ref 10–40)

## 2011-09-27 LAB — TROPONIN I
Troponin I: 0.34 ng/mL (ref 0.00–0.03)
Troponin I: 0.14 ng/mL (ref 0.00–0.03)

## 2011-09-27 LAB — GFR: EGFR: >60

## 2011-09-28 LAB — CBC AND DIFFERENTIAL
Basophils Absolute Automated: 0.02 10*3/uL (ref 0.00–0.20)
Basophils Automated: 0 % (ref 0–2)
Eosinophils Absolute Automated: 0.05 10*3/uL (ref 0.00–0.70)
Eosinophils Automated: 0 % (ref 0–5)
Hematocrit: 29.9 % — ABNORMAL LOW (ref 37.0–47.0)
Hgb: 9.6 g/dL — ABNORMAL LOW (ref 12.0–16.0)
Immature Granulocytes Absolute: 0.05 10*3/uL
Immature Granulocytes: 0 % (ref 0–1)
Lymphocytes Absolute Automated: 1.11 10*3/uL (ref 0.50–4.40)
Lymphocytes Automated: 8 % — ABNORMAL LOW (ref 15–41)
MCH: 29.5 pg (ref 28.0–32.0)
MCHC: 32.1 g/dL (ref 32.0–36.0)
MCV: 92 fL (ref 80.0–100.0)
MPV: 11.6 fL (ref 9.4–12.3)
Monocytes Absolute Automated: 1.61 10*3/uL — ABNORMAL HIGH (ref 0.00–1.20)
Monocytes: 12 % — ABNORMAL HIGH (ref 0–11)
Neutrophils Absolute: 10.44 10*3/uL — ABNORMAL HIGH (ref 1.80–8.10)
Neutrophils: 79 % — ABNORMAL HIGH (ref 52–75)
Nucleated RBC: 0 /100 WBC (ref 0–1)
Platelets: 154 10*3/uL (ref 140–400)
RBC: 3.25 10*6/uL — ABNORMAL LOW (ref 4.20–5.40)
RDW: 14 % (ref 12–15)
WBC: 13.23 10*3/uL — ABNORMAL HIGH (ref 3.50–10.80)

## 2011-09-28 LAB — COMPREHENSIVE METABOLIC PANEL
ALT: 111 U/L — ABNORMAL HIGH (ref 21–72)
AST (SGOT): 143 U/L — ABNORMAL HIGH (ref 8–39)
Albumin/Globulin Ratio: 0.7 — ABNORMAL LOW (ref 1.1–1.8)
Albumin: 2.5 g/dL — ABNORMAL LOW (ref 3.7–5.1)
Alkaline Phosphatase: 253 U/L — ABNORMAL HIGH (ref 43–122)
BUN: 23 mg/dL — ABNORMAL HIGH (ref 7–21)
Bilirubin, Total: 0.4 mg/dL (ref 0.2–1.3)
CO2: 23 mEq/L (ref 22–31)
Calcium: 7.9 mg/dL — ABNORMAL LOW (ref 8.6–10.2)
Chloride: 108 mEq/L — ABNORMAL HIGH (ref 98–107)
Creatinine: 0.8 mg/dL (ref 0.5–1.4)
Globulin: 3.6 g/dL (ref 2.0–3.7)
Glucose: 163 mg/dL — ABNORMAL HIGH (ref 70–100)
Potassium: 4 mEq/L (ref 3.6–5.0)
Protein, Total: 6.1 g/dL (ref 6.0–8.0)
Sodium: 142 mEq/L (ref 136–143)

## 2011-09-28 LAB — ECG 12-LEAD
Atrial Rate: 75 {beats}/min
P Axis: 15 degrees
P-R Interval: 146 ms
Q-T Interval: 398 ms
QRS Duration: 86 ms
QTC Calculation (Bezet): 444 ms
R Axis: -23 degrees
T Axis: 11 degrees
Ventricular Rate: 75 {beats}/min

## 2011-09-28 LAB — GFR: EGFR: >60

## 2011-09-29 LAB — COMPREHENSIVE METABOLIC PANEL
ALT: 107 U/L — ABNORMAL HIGH (ref 21–72)
AST (SGOT): 124 U/L — ABNORMAL HIGH (ref 8–39)
Albumin/Globulin Ratio: 0.7 — ABNORMAL LOW (ref 1.1–1.8)
Albumin: 2.7 g/dL — ABNORMAL LOW (ref 3.7–5.1)
Alkaline Phosphatase: 268 U/L — ABNORMAL HIGH (ref 43–122)
BUN: 23 mg/dL — ABNORMAL HIGH (ref 7–21)
Bilirubin, Total: 0.3 mg/dL (ref 0.2–1.3)
CO2: 25 mEq/L (ref 22–31)
Calcium: 8.3 mg/dL — ABNORMAL LOW (ref 8.6–10.2)
Chloride: 107 mEq/L (ref 98–107)
Creatinine: 0.9 mg/dL (ref 0.5–1.4)
Globulin: 3.7 g/dL (ref 2.0–3.7)
Glucose: 139 mg/dL — ABNORMAL HIGH (ref 70–100)
Potassium: 4.9 mEq/L (ref 3.6–5.0)
Protein, Total: 6.4 g/dL (ref 6.0–8.0)
Sodium: 143 mEq/L (ref 136–143)

## 2011-09-29 LAB — CBC AND DIFFERENTIAL
Basophils Absolute Automated: 0.02 10*3/uL (ref 0.00–0.20)
Basophils Automated: 0 % (ref 0–2)
Eosinophils Absolute Automated: 0.21 10*3/uL (ref 0.00–0.70)
Eosinophils Automated: 2 % (ref 0–5)
Hematocrit: 29.6 % — ABNORMAL LOW (ref 37.0–47.0)
Hgb: 9.7 g/dL — ABNORMAL LOW (ref 12.0–16.0)
Immature Granulocytes Absolute: 0.06 10*3/uL — ABNORMAL HIGH
Immature Granulocytes: 1 % (ref 0–1)
Lymphocytes Absolute Automated: 1.75 10*3/uL (ref 0.50–4.40)
Lymphocytes Automated: 14 % — ABNORMAL LOW (ref 15–41)
MCH: 30.2 pg (ref 28.0–32.0)
MCHC: 32.8 g/dL (ref 32.0–36.0)
MCV: 92.2 fL (ref 80.0–100.0)
MPV: 11.8 fL (ref 9.4–12.3)
Monocytes Absolute Automated: 1.3 10*3/uL — ABNORMAL HIGH (ref 0.00–1.20)
Monocytes: 10 % (ref 0–11)
Neutrophils Absolute: 9.53 10*3/uL — ABNORMAL HIGH (ref 1.80–8.10)
Neutrophils: 74 % (ref 52–75)
Nucleated RBC: 0 /100 WBC (ref 0–1)
Platelets: 172 10*3/uL (ref 140–400)
RBC: 3.21 10*6/uL — ABNORMAL LOW (ref 4.20–5.40)
RDW: 15 % (ref 12–15)
WBC: 12.81 10*3/uL — ABNORMAL HIGH (ref 3.50–10.80)

## 2011-09-29 LAB — GFR: EGFR: >60

## 2011-09-30 LAB — CBC AND DIFFERENTIAL
Basophils Absolute Automated: 0.02 10*3/uL (ref 0.00–0.20)
Basophils Absolute Automated: 0.03 10*3/uL (ref 0.00–0.20)
Basophils Automated: 0 % (ref 0–2)
Basophils Automated: 0 % (ref 0–2)
Eosinophils Absolute Automated: 0.15 10*3/uL (ref 0.00–0.70)
Eosinophils Absolute Automated: 0.24 10*3/uL (ref 0.00–0.70)
Eosinophils Automated: 1 % (ref 0–5)
Eosinophils Automated: 2 % (ref 0–5)
Hematocrit: 31.1 % — ABNORMAL LOW (ref 37.0–47.0)
Hematocrit: 29.4 % — ABNORMAL LOW (ref 37.0–47.0)
Hgb: 10.2 g/dL — ABNORMAL LOW (ref 12.0–16.0)
Hgb: 9.5 g/dL — ABNORMAL LOW (ref 12.0–16.0)
Immature Granulocytes Absolute: 0.09 10*3/uL — ABNORMAL HIGH
Immature Granulocytes Absolute: 0.1 10*3/uL — ABNORMAL HIGH
Immature Granulocytes: 1 % (ref 0–1)
Immature Granulocytes: 1 % (ref 0–1)
Lymphocytes Absolute Automated: 2.28 10*3/uL (ref 0.50–4.40)
Lymphocytes Absolute Automated: 2.31 10*3/uL (ref 0.50–4.40)
Lymphocytes Automated: 20 % (ref 15–41)
Lymphocytes Automated: 24 % (ref 15–41)
MCH: 29.9 pg (ref 28.0–32.0)
MCH: 29.7 pg (ref 28.0–32.0)
MCHC: 32.8 g/dL (ref 32.0–36.0)
MCHC: 32.3 g/dL (ref 32.0–36.0)
MCV: 91.2 fL (ref 80.0–100.0)
MCV: 91.9 fL (ref 80.0–100.0)
MPV: 11.7 fL (ref 9.4–12.3)
MPV: 11.8 fL (ref 9.4–12.3)
Monocytes Absolute Automated: 1.11 10*3/uL (ref 0.00–1.20)
Monocytes Absolute Automated: 0.99 10*3/uL (ref 0.00–1.20)
Monocytes: 10 % (ref 0–11)
Monocytes: 10 % (ref 0–11)
Neutrophils Absolute: 7.83 10*3/uL (ref 1.80–8.10)
Neutrophils Absolute: 6.19 10*3/uL (ref 1.80–8.10)
Neutrophils: 69 % (ref 52–75)
Neutrophils: 63 % (ref 52–75)
Nucleated RBC: 0 /100 WBC (ref 0–1)
Nucleated RBC: 0 /100 WBC (ref 0–1)
Platelets: 195 10*3/uL (ref 140–400)
Platelets: 192 10*3/uL (ref 140–400)
RBC: 3.41 10*6/uL — ABNORMAL LOW (ref 4.20–5.40)
RBC: 3.2 10*6/uL — ABNORMAL LOW (ref 4.20–5.40)
RDW: 14 % (ref 12–15)
RDW: 15 % (ref 12–15)
WBC: 11.39 10*3/uL — ABNORMAL HIGH (ref 3.50–10.80)
WBC: 9.76 10*3/uL (ref 3.50–10.80)

## 2011-09-30 LAB — COMPREHENSIVE METABOLIC PANEL
ALT: 98 U/L — ABNORMAL HIGH (ref 21–72)
ALT: 93 U/L — ABNORMAL HIGH (ref 21–72)
AST (SGOT): 87 U/L — ABNORMAL HIGH (ref 8–39)
AST (SGOT): 76 U/L — ABNORMAL HIGH (ref 8–39)
Albumin/Globulin Ratio: 0.7 — ABNORMAL LOW (ref 1.1–1.8)
Albumin/Globulin Ratio: 0.7 — ABNORMAL LOW (ref 1.1–1.8)
Albumin: 3 g/dL — ABNORMAL LOW (ref 3.7–5.1)
Albumin: 2.7 g/dL — ABNORMAL LOW (ref 3.7–5.1)
Alkaline Phosphatase: 278 U/L — ABNORMAL HIGH (ref 43–122)
Alkaline Phosphatase: 252 U/L — ABNORMAL HIGH (ref 43–122)
BUN: 21 mg/dL (ref 7–21)
BUN: 22 mg/dL — ABNORMAL HIGH (ref 7–21)
Bilirubin, Total: 0.3 mg/dL (ref 0.2–1.3)
Bilirubin, Total: 0.3 mg/dL (ref 0.2–1.3)
CO2: 25 mEq/L (ref 22–31)
CO2: 24 mEq/L (ref 22–31)
Calcium: 8.6 mg/dL (ref 8.6–10.2)
Calcium: 8.3 mg/dL — ABNORMAL LOW (ref 8.6–10.2)
Chloride: 107 mEq/L (ref 98–107)
Chloride: 106 mEq/L (ref 98–107)
Creatinine: 0.9 mg/dL (ref 0.5–1.4)
Creatinine: 1 mg/dL (ref 0.5–1.4)
Globulin: 4.1 g/dL — ABNORMAL HIGH (ref 2.0–3.7)
Globulin: 3.8 g/dL — ABNORMAL HIGH (ref 2.0–3.7)
Glucose: 105 mg/dL — ABNORMAL HIGH (ref 70–100)
Glucose: 126 mg/dL — ABNORMAL HIGH (ref 70–100)
Potassium: 4.3 mEq/L (ref 3.6–5.0)
Potassium: 4.5 mEq/L (ref 3.6–5.0)
Protein, Total: 7.1 g/dL (ref 6.0–8.0)
Protein, Total: 6.5 g/dL (ref 6.0–8.0)
Sodium: 143 mEq/L (ref 136–143)
Sodium: 142 mEq/L (ref 136–143)

## 2011-09-30 LAB — GFR
EGFR: >60
EGFR: >60

## 2011-09-30 LAB — TROPONIN I
Troponin I: <0.01 ng/mL (ref 0.00–0.03)
Troponin I: <0.01 ng/mL (ref 0.00–0.03)
Troponin I: <0.01 ng/mL (ref 0.00–0.03)

## 2011-09-30 LAB — CK
Creatine Kinase (CK): 87 U/L (ref 20–140)
Creatine Kinase (CK): 66 U/L (ref 20–140)
Creatine Kinase (CK): 61 U/L (ref 20–140)

## 2011-09-30 LAB — C3 COMPLEMENT: C3 Complement: 141 mg/dL (ref 83–193)

## 2011-09-30 LAB — C4 COMPLEMENT: C4 Complement: 31.3 mg/dL (ref 15.0–57.0)

## 2011-09-30 LAB — SEDIMENTATION RATE: Sed Rate: 91 mm/Hr — ABNORMAL HIGH (ref 0–20)

## 2011-09-30 LAB — C-REACTIVE PROTEIN: C-Reactive Protein: 4.8 mg/dL — ABNORMAL HIGH (ref 0.0–0.8)

## 2011-10-01 LAB — COMPREHENSIVE METABOLIC PANEL
ALT: 75 U/L — ABNORMAL HIGH (ref 21–72)
AST (SGOT): 55 U/L — ABNORMAL HIGH (ref 8–39)
Albumin/Globulin Ratio: 0.7 — ABNORMAL LOW (ref 1.1–1.8)
Albumin: 2.7 g/dL — ABNORMAL LOW (ref 3.7–5.1)
Alkaline Phosphatase: 257 U/L — ABNORMAL HIGH (ref 43–122)
BUN: 21 mg/dL (ref 7–21)
Bilirubin, Total: 0.4 mg/dL (ref 0.2–1.3)
CO2: 25 mEq/L (ref 22–31)
Calcium: 8.5 mg/dL — ABNORMAL LOW (ref 8.6–10.2)
Chloride: 104 mEq/L (ref 98–107)
Creatinine: 0.9 mg/dL (ref 0.5–1.4)
Globulin: 3.8 g/dL — ABNORMAL HIGH (ref 2.0–3.7)
Glucose: 139 mg/dL — ABNORMAL HIGH (ref 70–100)
Potassium: 4.4 mEq/L (ref 3.6–5.0)
Protein, Total: 6.5 g/dL (ref 6.0–8.0)
Sodium: 140 mEq/L (ref 136–143)

## 2011-10-01 LAB — CBC AND DIFFERENTIAL
Basophils Absolute Automated: 0.02 10*3/uL (ref 0.00–0.20)
Basophils Automated: 0 % (ref 0–2)
Eosinophils Absolute Automated: 0.17 10*3/uL (ref 0.00–0.70)
Eosinophils Automated: 2 % (ref 0–5)
Hematocrit: 29.8 % — ABNORMAL LOW (ref 37.0–47.0)
Hgb: 9.8 g/dL — ABNORMAL LOW (ref 12.0–16.0)
Immature Granulocytes Absolute: 0.12 10*3/uL — ABNORMAL HIGH
Immature Granulocytes: 2 % — ABNORMAL HIGH (ref 0–1)
Lymphocytes Absolute Automated: 1.9 10*3/uL (ref 0.50–4.40)
Lymphocytes Automated: 23 % (ref 15–41)
MCH: 30.2 pg (ref 28.0–32.0)
MCHC: 32.9 g/dL (ref 32.0–36.0)
MCV: 92 fL (ref 80.0–100.0)
MPV: 11.5 fL (ref 9.4–12.3)
Monocytes Absolute Automated: 0.93 10*3/uL (ref 0.00–1.20)
Monocytes: 11 % (ref 0–11)
Neutrophils Absolute: 5.21 10*3/uL (ref 1.80–8.10)
Neutrophils: 63 % (ref 52–75)
Nucleated RBC: 0 /100 WBC (ref 0–1)
Platelets: 204 10*3/uL (ref 140–400)
RBC: 3.24 10*6/uL — ABNORMAL LOW (ref 4.20–5.40)
RDW: 15 % (ref 12–15)
WBC: 8.23 10*3/uL (ref 3.50–10.80)

## 2011-10-01 LAB — GFR: EGFR: >60

## 2011-10-02 LAB — ECG 12-LEAD
Atrial Rate: 77 {beats}/min
P Axis: 44 degrees
P-R Interval: 160 ms
Q-T Interval: 396 ms
QRS Duration: 88 ms
QTC Calculation (Bezet): 448 ms
R Axis: -3 degrees
T Axis: 30 degrees
Ventricular Rate: 77 {beats}/min

## 2011-10-08 NOTE — Consults (Signed)
Miranda, [name]                                      MRN:          [id number]                                                          Account:      [id number]                                         Document ID:  [id number]                                                   Service Date: 09/26/2011                                                                                    Admit Date: 09/26/2011     Patient Location: [id number]  Patient Type: I     CONSULTING PHYSICIAN: Hardie Lora MD     REFERRING PHYSICIAN: Marcelino Freestone MD        CONSULTING SERVICE:  Cardiology.     CHIEF COMPLAINT:  The patient is seen in consultation at the request of Dr. Holley Dexter for cardiac  evaluation of chest pain and abnormal troponin.     HISTORY OF PRESENT ILLNESS:  [name] is a 78 year old woman with a history of hypertension,  diabetes, and hyperlipidemia.  The patient reports chest pain yesterday.  She describes a heavy sensation in the substernal area.  It increases in  severity with activities.  There was diaphoresis last night.  There was  associated shortness of breath this morning.  The pain has been  intermittent over the past 2 days.  On arrival in the emergency room, EKG  showed no acute changes; however, initial troponin I is abnormal.     The patient has a history of hypertension, diabetes, and hyperlipidemia.  She also has a reported history of congestive heart failure, pancreatitis,  and diverticulitis.  She was admitted in January with generalized weakness.   At that time, an echocardiogram showed normal left ventricular systolic  function and mild left ventricular hypertrophy.     ALLERGIES:  AZITHROMYCIN, CLARITHROMYCIN, DILAUDID, and IODINE.     SOCIAL HISTORY:  The patient lives alone.  She does not smoke.     FAMILY HISTORY:  The patient reports father had coronary artery disease.     REVIEW OF SYSTEMS:  GENERAL:  Positive generalized weakness.  Denies fever.  NEUROLOGIC:  Negative headache, negative  dizziness.  PULMONARY:  Denies shortness of breath, denies cough.  Page 1 of 3  RHYA, SHAN                                      MRN:          [id number]                                                          Account:      [id number]                                         Document ID:  [id number]                                                   Service Date: 09/26/2011                                                                                    CARDIOVASCULAR:  Positive chest pain.  Denies palpitation.  GASTROINTESTINAL:  Negative abdominal pain, negative GI bleed.  MUSCULOSKELETAL:  Negative falls.  All other systems are negative.     PHYSICAL EXAMINATION:  VITAL SIGNS:  Blood pressure is 129/55, heart rate 66, respirations of 18,  temperature of 99.  GENERAL:  Weak and frail-appearing woman in no distress.  HEENT:  Eyes are anicteric.  Oral mucosa is moist.  Dentition is poor.  There is no JVD.  Carotids are without bruits.  LUNGS:  Clear.  Respiration is unlabored.  HEART:  Rhythm is regular with a II/VI holosystolic murmur.  No S3, no rub.  ABDOMEN:  Soft, nontender.  Abdominal aorta is not palpable.  EXTREMITIES:  Lower extremities show no edema.  Upper extremities:  No  clubbing of the digits.  SKIN:  Warm and dry.  MUSCULOSKELETAL:  Normal tone and strength.  NEUROLOGIC:  Alert and oriented x3.  PSYCHIATRIC:  Mood and affect are appropriate.     DIAGNOSTIC DATA:  Independent review of the EKG from today at 12:08 p.m. shows a normal sinus  rhythm, normal QRS axis, poor R-wave progression, no ischemic ST-segment  changes.     Chest x-ray shows no active lung disease.     LABORATORY DATA:  White count of 18.4, hematocrit 30.0, platelets 176.  Potassium 3.6, BUN of  26, creatinine 1.0, sodium 144.  Troponin I 0.36.  ALT 151, AST of 241.  CPK 265.     DIAGNOSES:  1.  Chest pain.  Abnormal  electrocardiogram.  2.  Hypertension.  3.  Hyperlipidemia.  4.  Diabetes.  5.  Increased liver function tests.     COMMENT:  [name] is a 78 year old woman with multiple coronary  disease risk  factors who presents with chest pain.  Initial EKG showed no acute  ST-segment changes.  However, troponin I is elevated, consistent with acute  coronary syndrome.  She is currently chest pain free.  I recommend that we                                                                                                           Page 2 of 3  AUTUMN, PRUITT                                      MRN:          [id number]                                                          Account:      [id number]                                         Document ID:  [id number]                                                   Service Date: 09/26/2011                                                                                    continue with aspirin and stop beta blockers.  We will continue checking  troponin enzymes.  If stable, we will plan for a Lexiscan nuclear study in  the morning to guide medical therapy.  Given her advanced age, we favor a  conservative approach.  We would defer the workup of elevated LFTs to  primary care.     Thank you very much for the opportunity to participate in the care of this  patient, and our team will continue to follow her with you.           Electronic Signing Provider     D:  09/26/2011 16:40 PM by Dr. Hardie Lora, MD [phone])  T:  09/26/2011 18:22 PM by [email or web address]        cc: Eugenie Birks MD  Page 3 of 3  Authenticated by Marni Griffon, MD [phone]) On 10/08/2011 08:09:46 AM

## 2011-10-25 ENCOUNTER — Ambulatory Visit
Admit: 2011-10-25 | Discharge: 2011-10-25 | Disposition: A | Discharge status: Home / Self Care | Payer: Self-pay | Source: Ambulatory Visit

## 2012-01-16 ENCOUNTER — Ambulatory Visit
Admission: RE | Admit: 2012-01-16 | Discharge: 2012-01-16 | Disposition: A | Discharge status: Home / Self Care | Payer: Medicare Other | Source: Ambulatory Visit | Attending: Internal Medicine | Admitting: Internal Medicine

## 2012-01-16 ENCOUNTER — Other Ambulatory Visit: Payer: Self-pay | Admitting: Internal Medicine

## 2012-01-16 DIAGNOSIS — I82409 Acute embolism and thrombosis of unspecified deep veins of unspecified lower extremity: Secondary | ICD-10-CM

## 2012-01-16 DIAGNOSIS — M79609 Pain in unspecified limb: Secondary | ICD-10-CM | POA: Insufficient documentation

## 2012-01-16 DIAGNOSIS — R609 Edema, unspecified: Secondary | ICD-10-CM | POA: Insufficient documentation

## 2012-01-16 DIAGNOSIS — M79605 Pain in left leg: Secondary | ICD-10-CM

## 2012-03-03 ENCOUNTER — Ambulatory Visit: Payer: Medicare Other | Attending: Internal Medicine

## 2012-03-03 DIAGNOSIS — I83009 Varicose veins of unspecified lower extremity with ulcer of unspecified site: Secondary | ICD-10-CM | POA: Insufficient documentation

## 2012-03-03 DIAGNOSIS — G609 Hereditary and idiopathic neuropathy, unspecified: Secondary | ICD-10-CM | POA: Insufficient documentation

## 2012-03-03 DIAGNOSIS — L97909 Non-pressure chronic ulcer of unspecified part of unspecified lower leg with unspecified severity: Secondary | ICD-10-CM | POA: Insufficient documentation

## 2012-03-03 DIAGNOSIS — L97509 Non-pressure chronic ulcer of other part of unspecified foot with unspecified severity: Secondary | ICD-10-CM | POA: Insufficient documentation

## 2012-03-03 DIAGNOSIS — I509 Heart failure, unspecified: Secondary | ICD-10-CM | POA: Insufficient documentation

## 2012-03-03 DIAGNOSIS — I70209 Unspecified atherosclerosis of native arteries of extremities, unspecified extremity: Secondary | ICD-10-CM | POA: Insufficient documentation

## 2012-03-03 DIAGNOSIS — R609 Edema, unspecified: Secondary | ICD-10-CM | POA: Insufficient documentation

## 2012-03-03 DIAGNOSIS — I10 Essential (primary) hypertension: Secondary | ICD-10-CM | POA: Insufficient documentation

## 2012-03-03 DIAGNOSIS — E119 Type 2 diabetes mellitus without complications: Secondary | ICD-10-CM | POA: Insufficient documentation

## 2012-03-03 DIAGNOSIS — K219 Gastro-esophageal reflux disease without esophagitis: Secondary | ICD-10-CM | POA: Insufficient documentation

## 2012-03-03 DIAGNOSIS — I872 Venous insufficiency (chronic) (peripheral): Secondary | ICD-10-CM | POA: Insufficient documentation

## 2012-03-03 DIAGNOSIS — Z86718 Personal history of other venous thrombosis and embolism: Secondary | ICD-10-CM | POA: Insufficient documentation

## 2012-03-10 ENCOUNTER — Ambulatory Visit: Payer: Medicare Other

## 2012-03-10 ENCOUNTER — Ambulatory Visit
Admission: RE | Admit: 2012-03-10 | Discharge: 2012-03-10 | Disposition: A | Discharge status: Home / Self Care | Payer: Medicare Other | Source: Ambulatory Visit | Attending: Primary Podiatric Medicine | Admitting: Primary Podiatric Medicine

## 2012-03-10 DIAGNOSIS — I83009 Varicose veins of unspecified lower extremity with ulcer of unspecified site: Secondary | ICD-10-CM | POA: Insufficient documentation

## 2012-03-10 DIAGNOSIS — L97209 Non-pressure chronic ulcer of unspecified calf with unspecified severity: Secondary | ICD-10-CM | POA: Insufficient documentation

## 2012-03-10 DIAGNOSIS — R609 Edema, unspecified: Secondary | ICD-10-CM | POA: Insufficient documentation

## 2012-03-10 DIAGNOSIS — L97909 Non-pressure chronic ulcer of unspecified part of unspecified lower leg with unspecified severity: Secondary | ICD-10-CM | POA: Insufficient documentation

## 2012-03-10 DIAGNOSIS — L97509 Non-pressure chronic ulcer of other part of unspecified foot with unspecified severity: Secondary | ICD-10-CM | POA: Insufficient documentation

## 2012-03-10 DIAGNOSIS — I70209 Unspecified atherosclerosis of native arteries of extremities, unspecified extremity: Secondary | ICD-10-CM | POA: Insufficient documentation

## 2012-03-10 DIAGNOSIS — E1149 Type 2 diabetes mellitus with other diabetic neurological complication: Secondary | ICD-10-CM | POA: Insufficient documentation

## 2012-03-10 DIAGNOSIS — I509 Heart failure, unspecified: Secondary | ICD-10-CM | POA: Insufficient documentation

## 2012-03-10 DIAGNOSIS — K219 Gastro-esophageal reflux disease without esophagitis: Secondary | ICD-10-CM | POA: Insufficient documentation

## 2012-03-10 DIAGNOSIS — I872 Venous insufficiency (chronic) (peripheral): Secondary | ICD-10-CM | POA: Insufficient documentation

## 2012-03-10 DIAGNOSIS — I10 Essential (primary) hypertension: Secondary | ICD-10-CM | POA: Insufficient documentation

## 2012-03-10 DIAGNOSIS — M069 Rheumatoid arthritis, unspecified: Secondary | ICD-10-CM | POA: Insufficient documentation

## 2012-03-10 DIAGNOSIS — E1169 Type 2 diabetes mellitus with other specified complication: Secondary | ICD-10-CM | POA: Insufficient documentation

## 2012-03-17 ENCOUNTER — Ambulatory Visit: Payer: Medicare Other | Attending: Internal Medicine

## 2012-03-17 DIAGNOSIS — I83009 Varicose veins of unspecified lower extremity with ulcer of unspecified site: Secondary | ICD-10-CM | POA: Insufficient documentation

## 2012-03-17 DIAGNOSIS — L97509 Non-pressure chronic ulcer of other part of unspecified foot with unspecified severity: Secondary | ICD-10-CM | POA: Insufficient documentation

## 2012-03-17 DIAGNOSIS — L97909 Non-pressure chronic ulcer of unspecified part of unspecified lower leg with unspecified severity: Secondary | ICD-10-CM | POA: Insufficient documentation

## 2012-03-17 DIAGNOSIS — Z86718 Personal history of other venous thrombosis and embolism: Secondary | ICD-10-CM | POA: Insufficient documentation

## 2012-03-17 DIAGNOSIS — G609 Hereditary and idiopathic neuropathy, unspecified: Secondary | ICD-10-CM | POA: Insufficient documentation

## 2012-03-17 DIAGNOSIS — I70209 Unspecified atherosclerosis of native arteries of extremities, unspecified extremity: Secondary | ICD-10-CM | POA: Insufficient documentation

## 2012-03-17 DIAGNOSIS — R609 Edema, unspecified: Secondary | ICD-10-CM | POA: Insufficient documentation

## 2012-03-17 DIAGNOSIS — I872 Venous insufficiency (chronic) (peripheral): Secondary | ICD-10-CM | POA: Insufficient documentation

## 2012-03-17 DIAGNOSIS — M069 Rheumatoid arthritis, unspecified: Secondary | ICD-10-CM | POA: Insufficient documentation

## 2012-03-17 DIAGNOSIS — I10 Essential (primary) hypertension: Secondary | ICD-10-CM | POA: Insufficient documentation

## 2012-03-17 DIAGNOSIS — E119 Type 2 diabetes mellitus without complications: Secondary | ICD-10-CM | POA: Insufficient documentation

## 2012-03-17 DIAGNOSIS — K219 Gastro-esophageal reflux disease without esophagitis: Secondary | ICD-10-CM | POA: Insufficient documentation

## 2012-03-19 ENCOUNTER — Inpatient Hospital Stay: Payer: Medicare Other | Admitting: Internal Medicine

## 2012-03-19 ENCOUNTER — Emergency Department: Payer: Medicare Other

## 2012-03-19 ENCOUNTER — Inpatient Hospital Stay
Admission: EM | Admit: 2012-03-19 | Discharge: 2012-03-25 | DRG: 683 | Disposition: A | Discharge status: Skilled Nursing Facility | Payer: Medicare Other | Attending: Internal Medicine | Admitting: Internal Medicine

## 2012-03-19 DIAGNOSIS — K5792 Diverticulitis of intestine, part unspecified, without perforation or abscess without bleeding: Secondary | ICD-10-CM | POA: Diagnosis present

## 2012-03-19 DIAGNOSIS — K219 Gastro-esophageal reflux disease without esophagitis: Secondary | ICD-10-CM | POA: Diagnosis present

## 2012-03-19 DIAGNOSIS — IMO0002 Reserved for concepts with insufficient information to code with codable children: Secondary | ICD-10-CM

## 2012-03-19 DIAGNOSIS — Z794 Long term (current) use of insulin: Secondary | ICD-10-CM

## 2012-03-19 DIAGNOSIS — I2510 Atherosclerotic heart disease of native coronary artery without angina pectoris: Secondary | ICD-10-CM | POA: Diagnosis present

## 2012-03-19 DIAGNOSIS — K5289 Other specified noninfective gastroenteritis and colitis: Secondary | ICD-10-CM | POA: Diagnosis present

## 2012-03-19 DIAGNOSIS — E1169 Type 2 diabetes mellitus with other specified complication: Secondary | ICD-10-CM | POA: Diagnosis not present

## 2012-03-19 DIAGNOSIS — K5732 Diverticulitis of large intestine without perforation or abscess without bleeding: Secondary | ICD-10-CM | POA: Diagnosis present

## 2012-03-19 DIAGNOSIS — I129 Hypertensive chronic kidney disease with stage 1 through stage 4 chronic kidney disease, or unspecified chronic kidney disease: Secondary | ICD-10-CM | POA: Diagnosis present

## 2012-03-19 DIAGNOSIS — E860 Dehydration: Secondary | ICD-10-CM | POA: Diagnosis present

## 2012-03-19 DIAGNOSIS — I872 Venous insufficiency (chronic) (peripheral): Secondary | ICD-10-CM | POA: Diagnosis present

## 2012-03-19 DIAGNOSIS — I509 Heart failure, unspecified: Secondary | ICD-10-CM | POA: Diagnosis present

## 2012-03-19 DIAGNOSIS — D649 Anemia, unspecified: Secondary | ICD-10-CM | POA: Diagnosis present

## 2012-03-19 DIAGNOSIS — M4000 Postural kyphosis, site unspecified: Secondary | ICD-10-CM | POA: Diagnosis present

## 2012-03-19 DIAGNOSIS — Z8673 Personal history of transient ischemic attack (TIA), and cerebral infarction without residual deficits: Secondary | ICD-10-CM

## 2012-03-19 DIAGNOSIS — L97909 Non-pressure chronic ulcer of unspecified part of unspecified lower leg with unspecified severity: Secondary | ICD-10-CM | POA: Diagnosis present

## 2012-03-19 DIAGNOSIS — N179 Acute kidney failure, unspecified: Secondary | ICD-10-CM | POA: Diagnosis present

## 2012-03-19 DIAGNOSIS — N183 Chronic kidney disease, stage 3 unspecified: Secondary | ICD-10-CM | POA: Diagnosis present

## 2012-03-19 DIAGNOSIS — M069 Rheumatoid arthritis, unspecified: Secondary | ICD-10-CM | POA: Diagnosis present

## 2012-03-19 DIAGNOSIS — Z7982 Long term (current) use of aspirin: Secondary | ICD-10-CM

## 2012-03-19 DIAGNOSIS — J9819 Other pulmonary collapse: Secondary | ICD-10-CM | POA: Diagnosis not present

## 2012-03-19 DIAGNOSIS — Z66 Do not resuscitate: Secondary | ICD-10-CM | POA: Diagnosis present

## 2012-03-19 DIAGNOSIS — E8779 Other fluid overload: Secondary | ICD-10-CM | POA: Diagnosis not present

## 2012-03-19 DIAGNOSIS — M1990 Unspecified osteoarthritis, unspecified site: Secondary | ICD-10-CM | POA: Diagnosis present

## 2012-03-19 HISTORY — DX: Essential (primary) hypertension: I10

## 2012-03-19 HISTORY — DX: Type 2 diabetes mellitus without complications: E11.9

## 2012-03-19 HISTORY — DX: Unspecified osteoarthritis, unspecified site: M19.90

## 2012-03-19 LAB — URINALYSIS, REFLEX TO MICROSCOPIC EXAM IF INDICATED
Blood, UA: NEGATIVE
Glucose, UA: NEGATIVE
Ketones UA: NEGATIVE
Leukocyte Esterase, UA: NEGATIVE
Nitrite, UA: NEGATIVE
Protein, UR: 30 — AB
Specific Gravity UA: 1.02 (ref 1.001–1.035)
Urine pH: 6 (ref 5.0–8.0)
Urobilinogen, UA: NORMAL mg/dL

## 2012-03-19 LAB — COMPREHENSIVE METABOLIC PANEL
ALT: 20 U/L (ref 0–55)
AST (SGOT): 27 U/L (ref 5–34)
Albumin/Globulin Ratio: 0.6 — ABNORMAL LOW (ref 0.9–2.2)
Albumin: 3 g/dL — ABNORMAL LOW (ref 3.5–5.0)
Alkaline Phosphatase: 201 U/L — ABNORMAL HIGH (ref 40–150)
BUN: 32 mg/dL — ABNORMAL HIGH (ref 7–21)
Bilirubin, Total: 0.4 mg/dL (ref 0.2–1.2)
CO2: 23 mEq/L (ref 22–29)
Calcium: 9.2 mg/dL (ref 7.9–10.6)
Chloride: 107 mEq/L (ref 98–107)
Creatinine: 2.1 mg/dL — ABNORMAL HIGH (ref 0.6–1.0)
Globulin: 5.2 g/dL — ABNORMAL HIGH (ref 2.0–3.6)
Glucose: 176 mg/dL — ABNORMAL HIGH (ref 70–100)
Potassium: 4.5 mEq/L (ref 3.5–5.1)
Protein, Total: 8.2 g/dL (ref 6.0–8.3)
Sodium: 142 mEq/L (ref 136–145)

## 2012-03-19 LAB — B-TYPE NATRIURETIC PEPTIDE: B-Natriuretic Peptide: 78 pg/mL (ref 0–100)

## 2012-03-19 LAB — CBC AND DIFFERENTIAL
Basophils Absolute Automated: 0.03 10*3/uL (ref 0.00–0.20)
Basophils Automated: 0 % (ref 0–2)
Eosinophils Absolute Automated: 0.08 10*3/uL (ref 0.00–0.70)
Eosinophils Automated: 0 % (ref 0–5)
Hematocrit: 33.8 % — ABNORMAL LOW (ref 37.0–47.0)
Hgb: 10.8 g/dL — ABNORMAL LOW (ref 12.0–16.0)
Lymphocytes Absolute Automated: 1.58 10*3/uL (ref 0.50–4.40)
Lymphocytes Automated: 11 % — ABNORMAL LOW (ref 15–41)
MCH: 30.3 pg (ref 28.0–32.0)
MCHC: 32 g/dL (ref 32.0–36.0)
MCV: 94.7 fL (ref 80.0–100.0)
MPV: 11.5 fL (ref 9.4–12.3)
Monocytes Absolute Automated: 0.85 10*3/uL (ref 0.00–1.20)
Monocytes: 6 % (ref 0–11)
Neutrophils Absolute: 12.53 10*3/uL — ABNORMAL HIGH (ref 1.80–8.10)
Neutrophils: 83 % — ABNORMAL HIGH (ref 52–75)
Platelets: 255 10*3/uL (ref 140–400)
RBC: 3.57 10*6/uL — ABNORMAL LOW (ref 4.20–5.40)
RDW: 16 % — ABNORMAL HIGH (ref 12–15)
WBC: 15.07 10*3/uL — ABNORMAL HIGH (ref 3.50–10.80)

## 2012-03-19 LAB — APTT: PTT: 33 s (ref 23–37)

## 2012-03-19 LAB — PT/INR
PT INR: 1.1 (ref 0.9–1.1)
PT: 14.4 s (ref 12.6–15.0)

## 2012-03-19 LAB — LIPASE: Lipase: 48 U/L (ref 8–78)

## 2012-03-19 LAB — CK: Creatine Kinase (CK): 107 U/L (ref 29–168)

## 2012-03-19 LAB — GFR: EGFR: 26.5

## 2012-03-19 LAB — TROPONIN I: Troponin I: <0.01 ng/mL (ref 0.00–0.09)

## 2012-03-19 LAB — URINE ICTOTEST

## 2012-03-19 LAB — MAGNESIUM: Magnesium: 1.8 mg/dL (ref 1.6–2.6)

## 2012-03-19 LAB — STOOL OCCULT BLOOD: Stool Occult Blood: POSITIVE — AB

## 2012-03-19 MED ORDER — IOHEXOL 240 MG/ML IJ SOLN
50.00 mL | Freq: Once | INTRAMUSCULAR | Status: AC | PRN
Start: 2012-03-19 — End: 2012-03-19

## 2012-03-19 MED ORDER — PIPERACILLIN SOD-TAZOBACTAM SO 4.5 (4-0.5) G IV SOLR
4.50 g | Freq: Once | INTRAVENOUS | Status: AC
Start: 2012-03-19 — End: 2012-03-19
  Administered 2012-03-19: 4.5 g via INTRAVENOUS
  Filled 2012-03-19: qty 100
  Filled 2012-03-19: qty 4000

## 2012-03-19 MED ORDER — ONDANSETRON HCL 4 MG/2ML IJ SOLN
4.00 mg | INTRAMUSCULAR | Status: DC | PRN
Start: 2012-03-19 — End: 2012-03-25

## 2012-03-19 MED ORDER — MORPHINE SULFATE 4 MG/ML IJ/IV SOLN (WRAP)
4.00 mg | Freq: Once | INTRAVENOUS | Status: AC
Start: 2012-03-19 — End: 2012-03-19
  Administered 2012-03-19: 4 mg via INTRAVENOUS
  Filled 2012-03-19: qty 1

## 2012-03-19 MED ORDER — IOHEXOL 240 MG/ML IJ SOLN
INTRAMUSCULAR | Status: AC
Start: 2012-03-19 — End: 2012-03-19
  Administered 2012-03-19: 50 mL via ORAL
  Filled 2012-03-19: qty 50

## 2012-03-19 MED ORDER — METRONIDAZOLE IN NACL 500 MG/100 ML IV SOLN
500.00 mg | Freq: Once | INTRAVENOUS | Status: AC
Start: 2012-03-19 — End: 2012-03-19
  Administered 2012-03-19: 500 mg via INTRAVENOUS
  Filled 2012-03-19: qty 100

## 2012-03-19 MED ORDER — ONDANSETRON HCL 4 MG/2ML IJ SOLN
4.00 mg | Freq: Once | INTRAMUSCULAR | Status: AC
Start: 2012-03-19 — End: 2012-03-19
  Administered 2012-03-19: 4 mg via INTRAVENOUS
  Filled 2012-03-19: qty 2

## 2012-03-19 MED ORDER — HYDROMORPHONE HCL PF 1 MG/ML IJ SOLN
1.00 mg | INTRAMUSCULAR | Status: DC | PRN
Start: 2012-03-19 — End: 2012-03-25

## 2012-03-19 NOTE — ED Notes (Signed)
Patient arrived via EMS, c/o generalized weakness, nausea, vomiting and diarrhea since this morning. Patient alert and orient. X 3 (didn't know date) and answering question appropriately. Patient states she lives independently in an assistant living facility and prior to this morning is able to ambulate with a walker.

## 2012-03-19 NOTE — ED Notes (Signed)
N/v/d this am; abd pain

## 2012-03-19 NOTE — ED Provider Notes (Signed)
Physician/Midlevel provider first contact with patient: 03/19/12 1526         EMERGENCY DEPARTMENT NOTE    Physician/Midlevel provider first contact with patient: 03/19/12 1526         HISTORY OF PRESENT ILLNESS   Historian:Patient  Translator Used: No    78 y.o. female here from assisted living because of nausea, vomiting, non-bloody, non-bilious and frequent BM, brownish stool. No fevers. Patient does have diffuse abdominal pain. No fever. No cp. No headache. No sob.     1. Location of symptoms: generalized abd pain  2. Onset of symptoms: today  3. What was patient doing when symptoms started (Context):  4. Severity:  5. Timing:   6. Activities that worsen symptoms: none  7. Activities that improve symptoms: none  8. Quality:   9. Radiation of symptoms:  10. Associated signs and Symptoms:  11. Are symptoms worsening? yes  MEDICAL HISTORY     Past Medical History:  Past Medical History   Diagnosis Date   . Diabetes mellitus without complication    . Hypertensive disorder    . Arthritis      rheumatoid       Past Surgical History:  History reviewed. No pertinent past surgical history.    Social History:  History     Social History   . Marital Status: Widowed     Spouse Name: N/A     Number of Children: N/A   . Years of Education: N/A     Occupational History   . Not on file.     Social History Main Topics   . Smoking status: Never Smoker    . Smokeless tobacco: Not on file   . Alcohol Use: No   . Drug Use: No   . Sexually Active: Not on file     Other Topics Concern   . Not on file     Social History Narrative   . No narrative on file       Family History:  No family history on file.    Outpatient Medication:  Previous Medications    AMLODIPINE (NORVASC) 5 MG TABLET    Take 5 mg by mouth daily.    ASPIRIN 81 MG TABLET    Take 81 mg by mouth daily.    CALCIUM-VITAMIN D (OSCAL-500) 500-200 MG-UNIT PER TABLET    Take 1 tablet by mouth daily.    CETIRIZINE (ZYRTEC) 10 MG TABLET    Take 10 mg by mouth daily.     FUROSEMIDE (LASIX) 20 MG TABLET    Take 20 mg by mouth daily.     GABAPENTIN (NEURONTIN) 600 MG TABLET    Take 600 mg by mouth 2 (two) times daily.    GLIPIZIDE (GLUCOTROL) 5 MG TABLET    Take 5 mg by mouth 2 (two) times daily before meals.    INSULIN ASPART (NOVOLOG) 100 UNIT/ML INJECTION    Inject into the skin 3 (three) times daily before meals. Sliding scale    INSULIN GLARGINE (LANTUS) 100 UNIT/ML INJECTION    Inject 20 Units into the skin nightly.    MAGNESIUM OXIDE (MAG-OX) 400 MG TABLET    Take 400 mg by mouth daily.    METHOTREXATE (RHEUMATREX) 2.5 MG TABLET    Take 7.5 mg by mouth every mon, wed and fri.    PANTOPRAZOLE (PROTONIX) 40 MG TABLET    Take 40 mg by mouth daily.    PREDNISONE (DELTASONE) 5 MG TABLET  Take 5 mg by mouth daily.    SUCRALFATE (CARAFATE) 1 G TABLET    Take 1 g by mouth daily.    TRAMADOL-ACETAMINOPHEN (ULTRACET) 37.5-325 MG PER TABLET    Take 1 tablet by mouth every 6 (six) hours as needed.    VITAMIN B-12 (CYANOCOBALAMIN) 500 MCG TABLET    Take 500 mcg by mouth daily.         REVIEW OF SYSTEMS   ADD ROS  Review of Systems   Constitutional: Positive for malaise/fatigue.   Gastrointestinal: Positive for nausea, vomiting and blood in stool.   All other systems reviewed and are negative.        PHYSICAL EXAM     Filed Vitals:    03/19/12 1521   BP: 177/75   Pulse: 82   Temp: 98.1 F (36.7 C)   Resp: 20   SpO2: 94%     Nursing note and vitals reviewed.  Constitutional: cachetic; melenotic odor  Head:  Atraumatic. Normocephalic.    Eyes:  PERRL. EOMI. Conjunctivae are not pale.  ENT:  Mucous membranes are moist and intact. Oropharynx is clear and symmetric.  Patent airway.  Neck:  Supple. Full ROM.    Cardiovascular:  Regular rate. Regular rhythm. No murmurs, rubs, or gallops.  Pulmonary/Chest:  No evidence of respiratory distress. Clear to auscultation bilaterally.  No wheezing, rales or rhonchi.   Abdominal:  Mild generalized tenderness throughout, no rebound tenderness  Back:  Full  ROM. Nontender.  Extremities:  No edema. No cyanosis. No clubbing. Left lower leg in dressing, dry, clean, intact. Full range of motion in all extremities.  Skin:  Skin is warm and dry.  No diaphoresis. No rash.   Neurological:  wake, and appropriate. Weak voice. Motor normal.  Psychiatric:  Good eye contact.       MEDICAL DECISION MAKING     DISCUSSION      N/v abdominal pain, r/o colitis, diverticulitis, r/o gi bleeding.     Vital Signs: Reviewed the patient?s vital signs.   Nursing Notes: Reviewed and utilized available nursing notes.  Medical Records Reviewed: Reviewed available past medical records.      PROCEDURES        CARDIAC STUDIES    The following cardiac studies were independently interpreted by the Emergency Medicine Physician.  For full cardiac study results please see chart.    Monitor Strip  Interpreted by ED Physician  Rate: 80  Rhythm: NSR     EKG Interpretation:    15:56 NSR at 83 bpm, NAD, no LVH, qrs 78, qtc 427 (-) St-T abn similar to ekg on 09/30/2011 as read by me.     IMAGING STUDIES    The following imaging studies were independently interpreted by the Emergency Medicine Physician.  For full imaging study results please see chart.      XR CHEST AP PORTABLE    Final Result:  Mild new linear interstitial lung disease as compared to May     29 with small bilateral effusions. Findings are likely due to mild    CHF/fluid overload   CT ABDOMEN PELVIS WO IV/ W PO CONT    Final Result:  Findings suspicious for focal inflammation in the splenic     flexure either from diverticulitis or focal colitis. Examination is    otherwise stable.          PULSE OXIMETRY    Oxygen Saturation by Pulse Oximetry: 94%  Interventions:   Interpretation:  EMERGENCY DEPT. MEDICATIONS      ED Medication Orders      Start     Status Ordering Provider    03/19/12 2130   metroNIDAZOLE (FLAGYL) 500mg  in NS IVPB (premix)   Once      Route: Intravenous  Ordered Dose: 500 mg         Last MAR action:  New Bag Melanni Benway  SHU    03/19/12 2130   piperacillin-tazobactam (ZOSYN) 4.5 g in sodium chloride 0.9 % 100 mL IVPB mini-bag plus   Once      Route: Intravenous  Ordered Dose: 4.5 g         Last MAR action:  New Bag Clemmie Marxen [facility]    03/19/12 1945   morphine injection 4 mg   Once      Route: Intravenous  Ordered Dose: 4 mg         Last MAR action:  Given Ahriyah Vannest SHU    03/19/12 1815   ondansetron (ZOFRAN) injection 4 mg   Once      Route: Intravenous  Ordered Dose: 4 mg         Last MAR action:  Given Tysha Grismore SHU                LABORATORY RESULTS    Ordered and independently interpreted AVAILABLE laboratory tests. Please see results section in chart for full details.  Results for orders placed during the hospital encounter of 03/19/12   CBC AND DIFFERENTIAL       Component Value Range    WBC 15.07 (*) 3.50 - 10.80 x10 3/uL    RBC 3.57 (*) 4.20 - 5.40 x10 6/uL    Hgb 10.8 (*) 12.0 - 16.0 g/dL    Hematocrit 62.9 (*) 37.0 - 47.0 %    MCV 94.7  80.0 - 100.0 fL    MCH 30.3  28.0 - 32.0 pg    MCHC 32.0  32.0 - 36.0 g/dL    RDW 16 (*) 12 - 15 %    Platelets 255  140 - 400 x10 3/uL    MPV 11.5  9.4 - 12.3 fL    Neutrophils 83 (*) 52 - 75 %    Lymphocytes Automated 11 (*) 15 - 41 %    Monocytes 6  0 - 11 %    Eosinophils Automated 0  0 - 5 %    Basophils Automated 0  0 - 2 %    Neutrophils Absolute 12.53 (*) 1.80 - 8.10 x10 3/uL    Abs Lymph Automated 1.58  0.50 - 4.40 x10 3/uL    Abs Mono Automated 0.85  0.00 - 1.20 x10 3/uL    Abs Eos Automated 0.08  0.00 - 0.70 x10 3/uL    Absolute Baso Automated 0.03  0.00 - 0.20 x10 3/uL   COMPREHENSIVE METABOLIC PANEL       Component Value Range    Glucose 176 (*) 70 - 100 mg/dL    BUN 32 (*) 7 - 21 mg/dL    Creatinine 2.1 (*) 0.6 - 1.0 mg/dL    Sodium 528  413 - 244 mEq/L    Potassium 4.5  3.5 - 5.1 mEq/L    Chloride 107  98 - 107 mEq/L    CO2 23  22 - 29 mEq/L    Calcium 9.2  7.9 - 10.6 mg/dL    Protein, Total 8.2  6.0 - 8.3 g/dL    Albumin  3.0 (*) 3.5 - 5.0 g/dL    AST (SGOT) 27  5 - 34 U/L     ALT 20  0 - 55 U/L    Alkaline Phosphatase 201 (*) 40 - 150 U/L    Bilirubin, Total 0.4  0.2 - 1.2 mg/dL    Globulin 5.2 (*) 2.0 - 3.6 g/dL    Albumin/Globulin Ratio 0.6 (*) 0.9 - 2.2   LIPASE       Component Value Range    Lipase 48  8 - 78 U/L   URINALYSIS, REFLEX TO MICROSCOPIC EXAM IF INDICATED       Component Value Range    Urine Type Catheterized, F      Color, UA Yellow  Clear - Yellow    Clarity, UA Slightly Cloudy  Clear - Hazy    Specific Gravity UA 1.020  1.001 - 1.035    Urine pH 6.0  5.0-8.0    Leukocytes, UA Negative  Negative    Nitrite, UA Negative  Negative    Protein, UA 30 (*) Negative    Glucose, UA Negative  Negative    Ketones UA Negative  Negative    Urobilinogen, UA Normal  0.2  -  2.0 mg/dL    Bilirubin, UA See Icto  Negative    Blood, UA Negative  Negative    RBC, UA 0 - 5  0 - 5 /HPF    WBC, UA 0 - 5  0 - 5 /HPF    Hyaline Casts, UA 11 - 25 (*) 0 - 5 /LPF   TROPONIN I       Component Value Range    Troponin I <0.01  0.00 - 0.09 ng/mL   MAGNESIUM       Component Value Range    Magnesium 1.8  1.6 - 2.6 mg/dL   B-TYPE NATRIURETIC PEPTIDE       Component Value Range    B-Natriuretic Peptide 78  0 - 100 pg/mL   CK       Component Value Range    Creatine Kinase (CK) 107  29 - 168 U/L   GFR       Component Value Range    EGFR 26.5     STOOL OCCULT BLOOD       Component Value Range    Stool Occult Blood Positive (*) NEGATIVE   APTT       Component Value Range    PTT 33  23 - 37 sec   PT/INR       Component Value Range    PT 14.4  12.6 - 15.0 sec    PT INR 1.1  0.9 - 1.1    PT Anticoag. Given Within 48 hrs. None     URINE ICTOTEST       Component Value Range    Urine Ictotest see below  Negative   POCT GLUCOSE       Component Value Range    POCT Glucose WB 124 (*) 70 - 100 mg/dL       CONSULTATIONS and ED Course      Case discussed with Dr. Lossie Faes. Agree with admission.     Patient feels better. I have discussed all testing results and plan of care with patient. Patient agrees with admission.        ATTESTATIONS      Physician Attestation: I, Dr. Zadie Rhine Luverne Zerkle, MD PhD , have been the primary provider for [name] during this Emergency  Dept visit and have reviewed the chart documented by the scribe for accuracy and agree with its content.       DIAGNOSIS      Diagnosis:  Final diagnoses:   GI bleeding   Acute renal insufficiency   Diverticulitis       Disposition:  ED Disposition     Observation Admitting Physician: Anselmo Pickler [[id number]]  Diagnosis: Diverticulitis [194106]  Estimated Length of Stay: < 2 midnights  Tentative Discharge Plan?: Home or Self Care [1]  Patient Class: Observation [104]            Prescriptions:  Patient's Medications   New Prescriptions    No medications on file   Previous Medications    AMLODIPINE (NORVASC) 5 MG TABLET    Take 5 mg by mouth daily.    ASPIRIN 81 MG TABLET    Take 81 mg by mouth daily.    CALCIUM-VITAMIN D (OSCAL-500) 500-200 MG-UNIT PER TABLET    Take 1 tablet by mouth daily.    CETIRIZINE (ZYRTEC) 10 MG TABLET    Take 10 mg by mouth daily.    FUROSEMIDE (LASIX) 20 MG TABLET    Take 20 mg by mouth daily.     GABAPENTIN (NEURONTIN) 600 MG TABLET    Take 600 mg by mouth 2 (two) times daily.    GLIPIZIDE (GLUCOTROL) 5 MG TABLET    Take 5 mg by mouth 2 (two) times daily before meals.    INSULIN ASPART (NOVOLOG) 100 UNIT/ML INJECTION    Inject into the skin 3 (three) times daily before meals. Sliding scale    INSULIN GLARGINE (LANTUS) 100 UNIT/ML INJECTION    Inject 20 Units into the skin nightly.    MAGNESIUM OXIDE (MAG-OX) 400 MG TABLET    Take 400 mg by mouth daily.    METHOTREXATE (RHEUMATREX) 2.5 MG TABLET    Take 7.5 mg by mouth every mon, wed and fri.    PANTOPRAZOLE (PROTONIX) 40 MG TABLET    Take 40 mg by mouth daily.    PREDNISONE (DELTASONE) 5 MG TABLET    Take 5 mg by mouth daily.    SUCRALFATE (CARAFATE) 1 G TABLET    Take 1 g by mouth daily.    TRAMADOL-ACETAMINOPHEN (ULTRACET) 37.5-325 MG PER TABLET    Take 1 tablet by mouth every 6 (six)  hours as needed.    VITAMIN B-12 (CYANOCOBALAMIN) 500 MCG TABLET    Take 500 mcg by mouth daily.   Modified Medications    No medications on file   Discontinued Medications    No medications on file         Lige Lakeman, Zadie Rhine, MD [facility]  03/20/12 1714

## 2012-03-20 LAB — ECG 12-LEAD
Atrial Rate: 83 {beats}/min
P Axis: 42 degrees
P-R Interval: 154 ms
Q-T Interval: 364 ms
QRS Duration: 78 ms
QTC Calculation (Bezet): 427 ms
R Axis: -9 degrees
T Axis: 65 degrees
Ventricular Rate: 83 {beats}/min

## 2012-03-20 LAB — POCT GLUCOSE
Whole Blood Glucose POCT: 124 mg/dL — AB (ref 70–100)
Whole Blood Glucose POCT: 312 mg/dL — AB (ref 70–100)
Whole Blood Glucose POCT: 162 mg/dL — AB (ref 70–100)

## 2012-03-20 MED ORDER — ACETAMINOPHEN 325 MG PO TABS
ORAL_TABLET | Freq: Four times a day (QID) | ORAL | Status: DC | PRN
Start: 2012-03-20 — End: 2012-03-25
  Filled 2012-03-20 (×8): qty 1

## 2012-03-20 MED ORDER — ASPIRIN 81 MG PO TBEC
81.00 mg | DELAYED_RELEASE_TABLET | Freq: Every day | ORAL | Status: DC
Start: 2012-03-20 — End: 2012-03-25
  Administered 2012-03-20 – 2012-03-25 (×6): 81 mg via ORAL
  Filled 2012-03-20 (×6): qty 1

## 2012-03-20 MED ORDER — INSULIN GLARGINE 100 UNIT/ML SC SOLN
20.00 [IU] | Freq: Every evening | SUBCUTANEOUS | Status: DC
Start: 2012-03-20 — End: 2012-03-23
  Administered 2012-03-20 – 2012-03-22 (×3): 20 [IU] via SUBCUTANEOUS
  Filled 2012-03-20 (×3): qty 20

## 2012-03-20 MED ORDER — PIPERACILLIN SOD-TAZOBACTAM SO 2.25 (2-0.25) G IV SOLR
2.25 g | Freq: Four times a day (QID) | INTRAVENOUS | Status: DC
Start: 2012-03-20 — End: 2012-03-20
  Filled 2012-03-20 (×3): qty 2.25

## 2012-03-20 MED ORDER — AMLODIPINE BESYLATE 5 MG PO TABS
5.00 mg | ORAL_TABLET | Freq: Every day | ORAL | Status: DC
Start: 2012-03-20 — End: 2012-03-25
  Administered 2012-03-20 – 2012-03-25 (×6): 5 mg via ORAL
  Filled 2012-03-20 (×6): qty 1

## 2012-03-20 MED ORDER — SODIUM CHLORIDE 0.9 % IV MBP
2.25 g | Freq: Three times a day (TID) | INTRAVENOUS | Status: DC
Start: 2012-03-20 — End: 2012-03-21
  Administered 2012-03-20 – 2012-03-21 (×4): 2.25 g via INTRAVENOUS
  Filled 2012-03-20 (×4): qty 2.25

## 2012-03-20 MED ORDER — MAGNESIUM OXIDE 400 MG PO TABS
400.00 mg | ORAL_TABLET | Freq: Every day | ORAL | Status: DC
Start: 2012-03-20 — End: 2012-03-25
  Administered 2012-03-20 – 2012-03-25 (×6): 400 mg via ORAL
  Filled 2012-03-20 (×6): qty 1

## 2012-03-20 MED ORDER — SUCRALFATE 1 G PO TABS
1.00 g | ORAL_TABLET | Freq: Every day | ORAL | Status: DC
Start: 2012-03-20 — End: 2012-03-25
  Administered 2012-03-20 – 2012-03-25 (×6): 1 g via ORAL
  Filled 2012-03-20 (×6): qty 1

## 2012-03-20 MED ORDER — METRONIDAZOLE IN NACL 500 MG/100 ML IV SOLN
500.00 mg | Freq: Three times a day (TID) | INTRAVENOUS | Status: DC
Start: 2012-03-20 — End: 2012-03-21
  Administered 2012-03-20 – 2012-03-21 (×4): 500 mg via INTRAVENOUS
  Filled 2012-03-20 (×6): qty 100

## 2012-03-20 MED ORDER — METRONIDAZOLE IN NACL 500 MG/100 ML IV SOLN
500.00 mg | Freq: Once | INTRAVENOUS | Status: AC
Start: 2012-03-20 — End: 2012-03-20
  Administered 2012-03-20: 500 mg via INTRAVENOUS
  Filled 2012-03-20: qty 100

## 2012-03-20 MED ORDER — GLUCAGON HCL (RDNA) 1 MG IJ SOLR
1.00 mg | INTRAMUSCULAR | Status: DC | PRN
Start: 2012-03-20 — End: 2012-03-25

## 2012-03-20 MED ORDER — PREDNISONE 5 MG PO TABS
5.00 mg | ORAL_TABLET | Freq: Every day | ORAL | Status: DC
Start: 2012-03-20 — End: 2012-03-25
  Administered 2012-03-20 – 2012-03-25 (×6): 5 mg via ORAL
  Filled 2012-03-20 (×6): qty 1

## 2012-03-20 MED ORDER — DEXTROSE 50 % IV SOLN
25.00 mL | INTRAVENOUS | Status: DC | PRN
Start: 2012-03-20 — End: 2012-03-25
  Administered 2012-03-23: 25 mL via INTRAVENOUS
  Filled 2012-03-20: qty 50

## 2012-03-20 MED ORDER — SODIUM CHLORIDE 0.9 % IV SOLN
INTRAVENOUS | Status: DC
Start: 2012-03-20 — End: 2012-03-21

## 2012-03-20 MED ORDER — PANTOPRAZOLE SODIUM 40 MG PO TBEC
40.00 mg | DELAYED_RELEASE_TABLET | Freq: Every morning | ORAL | Status: DC
Start: 2012-03-20 — End: 2012-03-25
  Administered 2012-03-20 – 2012-03-25 (×6): 40 mg via ORAL
  Filled 2012-03-20 (×6): qty 1

## 2012-03-20 MED ORDER — GLUCOSE 40 % PO GEL
15.00 g | ORAL | Status: DC | PRN
Start: 2012-03-20 — End: 2012-03-25

## 2012-03-20 MED ORDER — GABAPENTIN 300 MG PO CAPS
600.00 mg | ORAL_CAPSULE | Freq: Two times a day (BID) | ORAL | Status: DC
Start: 2012-03-20 — End: 2012-03-25
  Administered 2012-03-20 – 2012-03-25 (×11): 600 mg via ORAL
  Filled 2012-03-20 (×11): qty 2

## 2012-03-20 MED ORDER — INSULIN ASPART 100 UNIT/ML SC SOLN
1.00 [IU] | SUBCUTANEOUS | Status: DC | PRN
Start: 2012-03-20 — End: 2012-03-25
  Administered 2012-03-20: 7 [IU] via SUBCUTANEOUS
  Administered 2012-03-20: 1 [IU] via SUBCUTANEOUS
  Administered 2012-03-21: 2 [IU] via SUBCUTANEOUS
  Administered 2012-03-21: 1 [IU] via SUBCUTANEOUS
  Administered 2012-03-22: 5 [IU] via SUBCUTANEOUS
  Administered 2012-03-23 – 2012-03-24 (×2): 2 [IU] via SUBCUTANEOUS
  Administered 2012-03-24 (×2): 1 [IU] via SUBCUTANEOUS
  Filled 2012-03-20 (×2): qty 1
  Filled 2012-03-20: qty 2
  Filled 2012-03-20: qty 7
  Filled 2012-03-20: qty 1
  Filled 2012-03-20: qty 2
  Filled 2012-03-20: qty 5
  Filled 2012-03-20: qty 1
  Filled 2012-03-20: qty 2

## 2012-03-20 NOTE — Progress Notes (Signed)
Pt received from ED at 2300 in a stable condition. denies pain no nausea and vomiting.Pt stated she started having loose stool this morning,nausea and vomiting and weakness.  patient had loose stool x1 no active bleeding or blood stain Noted.skin dry and intact pm care started scd on right leg. Left leg has wound wrapped by  the cvir intact .oriented pt to the call light and environment and safety.

## 2012-03-20 NOTE — OT Eval Note (Cosign Needed)
[facility]  [address]  [place], [place] [address]  [phone]    Occupational Therapy Evaluation    Patient: [name] MRN: [id number]   Unit: 4B ORHTOPEDICS, SURGICAL, PEDIATRICS Bed: A213/Y865-78    Time of treatment: Time Calculation  OT Received On: 03/20/12  Start Time: 1135  Stop Time: 1155  Time Calculation (min): 20 min    Consult received for [name] for OT evaluation and treatment.  Patient's medical condition is appropriate for Occupational Therapy  intervention at this time.    Medical Diagnosis: Diverticulitis [562.11]  GI bleeding [578.9]  Acute renal insufficiency [593.9]  94106Diverticulitis194106  94106Diverticulitis94106    History of Present Illness: [name] is a 78 y.o. female admitted on  03/19/2012 with diarrhea, nausea and vomiting. Started yesterday with non-bloody, non-bilious vomiting and frequent BM's with brown stool. Denied pain. On ER evaluation it was noted she had leukocytosis, renal failure and CT A/P w/o contrast shows possible diverticulitis. She was recently on abx for a venous stasis ulcer. Denies food poisoning or sick contacts.       Patient Active Problem List   Diagnosis   . Type II or unspecified type diabetes mellitus with neurological manifestations, not stated as uncontrolled(250.60)   . Atherosclerosis of native arteries of the extremities, unspecified   . Type II or unspecified type diabetes mellitus with neurological manifestations, not stated as uncontrolled(250.60)   . Chronic venous insufficiency   . CHF (congestive heart failure)   . HTN (hypertension)   . History of DVT of lower extremity   . GERD (gastroesophageal reflux disease)   . RA (rheumatoid arthritis)   . Varicose veins of lower extremities with ulcer     Past Medical History   Diagnosis Date   . Diabetes mellitus without complication    . Hypertensive disorder    . Arthritis      rheumatoid     History reviewed. No pertinent past surgical  history.      Precautions:   falls  Contact isolation    X-Rays/Tests/Labs:  Results for ARPI, DIEBOLD (MRN [id number]) as of 03/20/2012 12:09    Ref. Range  03/19/2012 16:24    Hemoglobin  Latest Range: 12.0-16.0 g/dL  84.1 (L)    Hematocrit  Latest Range: 37.0-47.0 %  33.8 (L)    CT Abd/Pelvis with PO Contrast (Order #[id number]) on 03/19/2012 - Imaging Information   IMPRESSION:   Findings suspicious for focal inflammation in the splenic   flexure either from diverticulitis or focal colitis. Examination is   otherwise stable.   XR Chest AP Portable (Order #[id number]) on 03/19/2012 - Imaging Information   IMPRESSION:   Mild new linear interstitial lung disease as compared to May   29 with small bilateral effusions. Findings are likely due to mild   CHF/fluid overload    Social History:  Lives alone in a senior apartment [facility])  Entry Steps: none   Rails: na Inside steps: none  Rails: na  Equipment at home: Rollator, tub shower bench and grab bars for tub, raised toilet, grab bars by toilet, reacher, sock aid  Prior Level of Function: Caregiver assists with bathing and dressing. Pt gets meals on wheels.     Cognition: intact    Mobility: modified independent with use of Rollator    Feeding: independent   Grooming: modified independent; sits down   Bathing: Min A (from caregiver)   Dressing: Min A (from caregiver)  Toileting: modified independent     Subjective: Patient is agreeable to participation in the therapy session. Nursing clears patient for therapy.  Pain: Pt reported pain of 7/10 in LLE (at wound)    Objective:  Patient is in bed with  Intravenous (IV) and Sequential Compression Device (SCD) on RLE in place.    Observation of patient/vitals:    Filed Vitals:    03/19/12 2140 03/19/12 2312 03/20/12 0810 03/20/12 1428   BP: 99/50 125/55 129/50 133/49   Pulse: 84 76 71 74   Temp:  99 F (37.2 C) 98.6 F (37 C) 99.5 F (37.5 C)   TempSrc:  Oral     Resp: 18 18 18 18    Height:       Weight:        SpO2: 100% 93% 90% 94%       Orientation/Cognition:  Alert and Oriented x 4  Cognition: no deficits noted; follows commands well     Musculoskeletal Examination:     ROM Strength   RUE WFL 4/5   LUE WFL 4/5     Sensation: intact BUE; no N/T  Coordination: BUE WFL  Vision: WFL   Hearing: WFL    Functional Mobilty:  Rolling: independent    Scooting:supervision  Supine to sit: supervision  Sit to Supine: NT  Sit to stand: Min A   Stand to sit: Min A   Transfers: Pt able to ambulate approximately 5 steps to bedside chair using FWW with Min A     Balance:  Static Sit Balance: good  Dynamic Sit Balance: good  Static Stand Balance: fair  Dynamic Stand Balance: fair    Self Care:  Eating: set-up; seated  Grooming: set-up;seated  Bathing: Mod A  UB Dressing: set-up  LB Dressing: Mod A  Toileting: Min A     Endurance: fair    Participation:  good    Education:  Educated the patient to role of occupational therapy, plan of care, goals  of therapy and HEP, safety with mobility and ADLs, home safety.    Assessment:  [name] is a 78 y.o. female admitted 03/19/2012.  Pt very cooperative and motivated to participate in therapy. Pt's ability to complete ADLs and functional transfers is impaired due to the following deficits:  Activity tolerance, strength, balance, mobility.  Pt would continue to benefit from OT to address these deficits and increase independence with ADLs and functional transfers.     Rehabilitation Potential: good; with continued OT     Patient is in bedside chair with Intravenous (IV), and call bell within reach. RN notified of session outcome.    G codes:   Function-Related G Codes  Function Related G Code: Self Care      Self Care G Code Set  Self Care, Current Status (A2130): At least 20 percent but less than 40 percent impaired, limited or restricted  Self Care, Goal Status (Q6578): At least 1 percent but less than 20 percent impaired, limited or restricted  Tools used to determine level of  impairment:  (clinical judgement)      Patient's current impairment level is: Self Care, Current Status (I6962): CJ   Patient is expected to achieve: Self Care, Goal Status (X5284): CI   Patient discharge impairments level is:      Therapy Diagnosis: decline in self care status  Rehabilitation Potential: good     Goals:  Goal Formulation: Patient  Time For Goal Achievement: 5 visits  Goals: Select  goal    ADL Goals  Patient will dress lower body: with minimal assist (with AE if needed)  Pt will complete bathing: With minimal assist  Patient will toilet: with supervision    Mobility and Transfer Goals  Pt will make functional transfers: with supervision    OT Plan  Treatment Interventions: ADL retraining;Functional transfer training;UE strengthening/ROM;Endurance training;Patient/Family training;Equipment eval/education  OT Frequency Recommended: 3-4x/wk    D/C Suggestions:  HHOT; 24 hour supervision; increased assist from caregivers     Signature:   Louie Boston, OT  03/20/2012  2:39 PM  Phone: [phone]     Attention MD:   Thank you for allowing [place] to participate in the care of [name]. Regulations from the [employer] for Medicare and Medicaid Services (CMS) require your review and approval of this plan of care.     Please cosign this note indicating you are in agreement with the OT Plan of Care so we may initiate the therapy treatment plan

## 2012-03-20 NOTE — Progress Notes (Signed)
Patient alert and oriented x4. Remains on contact for hx MDR. IVF. Patient requesting BR, offered bedpan--filled it, and moderate amount spilled over with removal. Positive flatus, no BM. 375cc recorded for urine output. HS FS 162, covered per SS. Also received 20units of Lantus. Graham crackers given with fresh ice water for snack.   Patient complains of BLE due to neuropathy. Scheduled Neurontin helps per patient. Continues with IV antibiotics, tolerating well. Alba Cory 03/20/12 2045

## 2012-03-20 NOTE — Progress Notes (Signed)
Pt has scd#[id number]

## 2012-03-20 NOTE — H&P (Signed)
ADMISSION HISTORY AND PHYSICAL EXAM    Date Time: 03/20/2012 9:10 AM  Patient Name: [name]  Attending Physician: Anselmo Pickler, MD    Assessment:   Acute Kidney Injury/ Dehydration  Acute Diverticulitis/ Colitis  History of coronary artery disease without intervention.  CKD stage 3  Left Leg Venous Stasis Ulcer  Lipidemia.   Type 2 diabetes, controlled, insulin dependent   Rheumatoid arthritis on prednisone.   History of DVT with placement of IVC filter.   Degenerative joint disease.   Hypertension.   CHF.   GERD.  DNR/I    Patient Active Problem List   Diagnosis   . Type II or unspecified type diabetes mellitus with neurological manifestations, not stated as uncontrolled(250.60)   . Atherosclerosis of native arteries of the extremities, unspecified   . Type II or unspecified type diabetes mellitus with neurological manifestations, not stated as uncontrolled(250.60)   . Chronic venous insufficiency   . CHF (congestive heart failure)   . HTN (hypertension)   . History of DVT of lower extremity   . GERD (gastroesophageal reflux disease)   . RA (rheumatoid arthritis)   . Varicose veins of lower extremities with ulcer       Plan:   For diverticulitis/ colitis will proceed with conservative management. If not respondiding will consult general surgery.  Hydrate with NS at 150 mL/hour. Monitor renal function. Tolerated regular diet but will consider changing to clears depending on clinical course. Diarrhea seems to have resolved. Blood in stool likely due to diverticulitis, avoid heparin products. Rule out C Diff. Flagyl, zosyn IV for now. Continue home medications. Continue local care to venous stasis ulcer. Continue insulin SS, hold oral hypoglycemic. OOB to chair, ambulate. SCD's for DVT prophylaxis.    History of Present Illness:   [name] is a 78 y.o. female who presents to the hospital with diarrhea, nausea and vomiting. Started yesterday with non-bloody, non-bilious vomiting and frequent BM's  with brown stool. Denied pain. On ER evaluation it was noted she had leukocytosis, renal failure and CT A/P w/o contrast shows possible diverticulitis. She was recently on abx for a venous stasis ulcer. Denies food poisoning or sick contacts.     Past Medical History:     Past Medical History   Diagnosis Date   . Diabetes mellitus without complication    . Hypertensive disorder    . Arthritis      rheumatoid       Past Surgical History:   History reviewed. No pertinent past surgical history.    Family History:   No family history on file.    Social History:     History     Social History   . Marital Status: Widowed     Spouse Name: N/A     Number of Children: N/A   . Years of Education: N/A     Social History Main Topics   . Smoking status: Never Smoker    . Smokeless tobacco: Not on file   . Alcohol Use: No   . Drug Use: No   . Sexually Active: Not on file     Other Topics Concern   . Not on file     Social History Narrative   . No narrative on file       Allergies:     Allergies   Allergen Reactions   . Azithromycin    . Dilaudid (Hydromorphone Hcl)    . Erythromycin    . Iodine  Iv contrast       Medications:     Prescriptions prior to admission   Medication Sig   . amLODIPine (NORVASC) 5 MG tablet Take 5 mg by mouth daily.   Marland Kitchen aspirin 81 MG tablet Take 81 mg by mouth daily.   . calcium-vitamin D (OSCAL-500) 500-200 MG-UNIT per tablet Take 1 tablet by mouth daily.   . cetirizine (ZYRTEC) 10 MG tablet Take 10 mg by mouth daily.   . furosemide (LASIX) 20 MG tablet Take 20 mg by mouth daily.    Marland Kitchen gabapentin (NEURONTIN) 600 MG tablet Take 600 mg by mouth 2 (two) times daily.   Marland Kitchen glipiZIDE (GLUCOTROL) 5 MG tablet Take 5 mg by mouth 2 (two) times daily before meals.   . insulin aspart (NOVOLOG) 100 UNIT/ML injection Inject into the skin 3 (three) times daily before meals. Sliding scale   . insulin glargine (LANTUS) 100 UNIT/ML injection Inject 20 Units into the skin nightly.   . magnesium oxide (MAG-OX) 400 MG  tablet Take 400 mg by mouth daily.   . methotrexate (RHEUMATREX) 2.5 MG tablet Take 7.5 mg by mouth every mon, wed and fri.   . pantoprazole (PROTONIX) 40 MG tablet Take 40 mg by mouth daily.   . predniSONE (DELTASONE) 5 MG tablet Take 5 mg by mouth daily.   . sucralfate (CARAFATE) 1 G tablet Take 1 g by mouth daily.   . traMADol-acetaminophen (ULTRACET) 37.5-325 MG per tablet Take 1 tablet by mouth every 6 (six) hours as needed.   . vitamin B-12 (CYANOCOBALAMIN) 500 MCG tablet Take 500 mcg by mouth daily.       Review of Systems:   A comprehensive review of systems was: History obtained from the patient  General ROS: positive for  - malaise  Psychological ROS: negative  Respiratory ROS: no cough, shortness of breath, or wheezing  Cardiovascular ROS: no chest pain or dyspnea on exertion  Gastrointestinal ROS: positive for - abdominal pain, blood in stools, diarrhea and nausea/vomiting  Genito-Urinary ROS: no dysuria, trouble voiding, or hematuria  Musculoskeletal ROS: positive for - joint pain and joint stiffness  Neurological ROS: generalized weakness  Dermatological ROS: negative    Physical Exam:     Filed Vitals:    03/20/12 0810   BP: 129/50   Pulse: 71   Temp: 98.6 F (37 C)   Resp: 18   SpO2: 90%       Intake and Output Summary (Last 24 hours) at Date Time    Intake/Output Summary (Last 24 hours) at 03/20/12 0910  Last data filed at 03/20/12 0700   Gross per 24 hour   Intake    600 ml   Output      0 ml   Net    600 ml       General appearance - oriented to person, place, and time, ill-appearing and weak  Mental status - normal mood, behavior, speech, dress, motor activity, and thought processes  Chest - rales noted on bases, dry, greater on right  Heart - normal rate, regular rhythm, normal S1, S2, no murmurs, rubs, clicks or gallops  Abdomen - tenderness noted left lower quadrant, no guarding, no rebound tenderness  Neurological - alert, oriented, normal speech, no focal findings or movement disorder  noted, generalized weakness  Extremities - peripheral pulses normal, no pedal edema, no clubbing or cyanosis, venous stasis ulcer on left leg, dsg in place, no redness, warmth or pus    Labs:  Results     Procedure Component Value Units Date/Time    Urine culture [[id number]] Collected:03/19/12 1750    Specimen Information:Urine / Urine, Catheterized, Foley Updated:03/19/12 2102    Urine Ictotest [[id number]] Collected:03/19/12 1750     Urine Ictotest see below Updated:03/19/12 1809    UA, Reflex to Microscopic [[id number]]  (Abnormal) Collected:03/19/12 1750    Specimen Information:Urine Updated:03/19/12 1804     Urine Type Catheterized, F      Color, UA Yellow      Clarity, UA Slightly Cloudy      Specific Gravity UA 1.020      Urine pH 6.0      Leukocytes, UA Negative      Nitrite, UA Negative      Protein, UA 30 (A)      Glucose, UA Negative      Ketones UA Negative      Urobilinogen, UA Normal mg/dL      Bilirubin, UA See Icto      Blood, UA Negative      RBC, UA 0 - 5 /HPF      WBC, UA 0 - 5 /HPF      Hyaline Casts, UA 11 - 25 (A) /LPF     Prothrombin time/INR [[id number]] Collected:03/19/12 1623    Specimen Information:Blood Updated:03/19/12 1748     PT 14.4 sec      PT INR 1.1      PT Anticoag. Given Within 48 hrs. None     APTT [[id number]] Collected:03/19/12 1623     PTT 33 sec Updated:03/19/12 1739    B-type Natriuretic Peptide [[id number]] Collected:03/19/12 1624    Specimen Information:Blood Updated:03/19/12 1708     B-Natriuretic Peptide 78 pg/mL     Troponin I [[id number]] Collected:03/19/12 1624    Specimen Information:Blood Updated:03/19/12 1705     Troponin I <0.01 ng/mL     Comprehensive metabolic panel [[id number]]  (Abnormal) Collected:03/19/12 1623    Specimen Information:Blood Updated:03/19/12 1704     Glucose 176 (H) mg/dL      BUN 32 (H) mg/dL      Creatinine 2.1 (H) mg/dL      Sodium 756 mEq/L      Potassium 4.5 mEq/L      Chloride 107 mEq/L      CO2 23 mEq/L      Calcium 9.2 mg/dL      Protein,  Total 8.2 g/dL      Albumin 3.0 (L) g/dL      AST (SGOT) 27 U/L      ALT 20 U/L      Alkaline Phosphatase 201 (H) U/L      Bilirubin, Total 0.4 mg/dL      Globulin 5.2 (H) g/dL      Albumin/Globulin Ratio 0.6 (L)     Lipase [[id number]] Collected:03/19/12 1623    Specimen Information:Blood Updated:03/19/12 1704     Lipase 48 U/L     Magnesium [[id number]] Collected:03/19/12 1623    Specimen Information:Blood Updated:03/19/12 1704     Magnesium 1.8 mg/dL     Creatine Kinase (CK) [[id number]] Collected:03/19/12 1623    Specimen Information:Blood Updated:03/19/12 1704     Creatine Kinase (CK) 107 U/L     GFR [[id number]] Collected:03/19/12 1623     EGFR 26.5 Updated:03/19/12 1704    Stool Occult Blood [[id number]]  (Abnormal) Collected:03/19/12 1624    Specimen Information:Stool Updated:03/19/12 1643     Stool Occult Blood Positive (A)     CBC and differential [[id number]]  (Abnormal) Collected:03/19/12  1624    Specimen Information:Blood / Blood Updated:03/19/12 1639     WBC 15.07 (H) x10 3/uL      RBC 3.57 (L) x10 6/uL      Hgb 10.8 (L) g/dL      Hematocrit 96.2 (L) %      MCV 94.7 fL      MCH 30.3 pg      MCHC 32.0 g/dL      RDW 16 (H) %      Platelets 255 x10 3/uL      MPV 11.5 fL      Neutrophils 83 (H) %      Lymphocytes Automated 11 (L) %      Monocytes 6 %      Eosinophils Automated 0 %      Basophils Automated 0 %      Neutrophils Absolute 12.53 (H) x10 3/uL      Abs Lymph Automated 1.58 x10 3/uL      Abs Mono Automated 0.85 x10 3/uL      Abs Eos Automated 0.08 x10 3/uL      Absolute Baso Automated 0.03 x10 3/uL               Rads:   Radiological Procedure reviewed.  Radiology Results (24 Hour)     Procedure Component Value Units Date/Time    CT Abd/Pelvis with PO Contrast [[id number]] Collected:03/19/12 2028    Order Status:Completed  Updated:03/19/12 2042    Narrative:    HISTORY: Abdominal pain     TECHNIQUE: Spiral imaging was performed throughout the abdomen without  contrast.     COMPARISON: May 23, 2011.      FINDINGS: There is right lower lobe alveolar infiltrates suspicious for  a right lower lobe pneumonia possibly aspiration pneumonia.     The liver and spleen were remarkable for a nodular contour to the liver  and findings that would suggest cirrhosis. There is a small amount of  ascites overlying the right edge of the liver.     Atherosclerotic changes were noted throughout the aorta with moderate  tortuosity and mild aneurysmal dilatation of the proximal aorta just at  the level of the hiatus. The adrenal glands, IVC and pancreas overall  unremarkable and unchanged. An IVC filter is noted within the IVC. The  kidneys are unremarkable. The bowel and mesentery were overall were  unremarkable. There is some questionable inflammatory change in the  region of the splenic flexure. The findings are suspicious for some  focal colitis or diverticulitis. The bowel mesentery is otherwise  unremarkable.     Scanning into the pelvis demonstrates artifact from the patient's hip  prosthesis. No definite free fluid is noted. No obvious abnormality is  noted to the bowel mesentery.       Impression:     Findings suspicious for focal inflammation in the splenic  flexure either from diverticulitis or focal colitis. Examination is  otherwise stable.    XR Chest AP Portable [[id number]] Collected:03/19/12 1553    Order Status:Completed  Updated:03/19/12 1612    Narrative:    HISTORY: Chest pain     Single portable view     Small bilateral effusions. Mild new linear interstitial lung disease       Impression:     Mild new linear interstitial lung disease as compared to May  29 with small bilateral effusions. Findings are likely due to mild  CHF/fluid overload          Signed  by: Pearletha Forge, MD

## 2012-03-20 NOTE — Progress Notes (Signed)
Hx of DM, morning blood sugar 55, will give orange juice and continue to monitor.

## 2012-03-20 NOTE — Progress Notes (Signed)
Patient awake in chair, complains of pain to lower legs, sch gabapentin given. IV fluids infusing at 150cc/hr. Denies SOB and cough. Dressing to left leg, dressing clean dry and intact. Isolation for r/o cdiff. Stool is formed. Safety maintained bed in low position and call light within reach.

## 2012-03-20 NOTE — PT Eval Note (Signed)
[facility]  [address]  [place], [place] [address]  [phone]    Physical Therapy Evaluation    Patient: [name] MRN: [id number]   Unit: 4B ORHTOPEDICS, SURGICAL, PEDIATRICS Bed: O756/E332-95    Time of Treatment: Time Calculation  PT Received On: 03/20/12  Start Time: 1110  Stop Time: 1135  Time Calculation (min): 25 min    Consult received for [name] for PT evaluation and treatment.  Patient's medical condition is appropriate for Physical Therapy  intervention at this time.    Medical Diagnosis: Diverticulitis [562.11]  GI bleeding [578.9]  Acute renal insufficiency [593.9]  94106Diverticulitis194106  94106Diverticulitis94106     History of Present Illness: [name] is a 78 y.o. female admitted on  03/19/2012 with diarrhea, nausea and vomiting. Started yesterday with non-bloody, non-bilious vomiting and frequent BM's with brown stool. Denied pain. On ER evaluation it was noted she had leukocytosis, renal failure and CT A/P w/o contrast shows possible diverticulitis. She was recently on abx for a venous stasis ulcer. Denies food poisoning or sick contacts.     Patient Active Problem List   Diagnosis   . Type II or unspecified type diabetes mellitus with neurological manifestations, not stated as uncontrolled(250.60)   . Atherosclerosis of native arteries of the extremities, unspecified   . Type II or unspecified type diabetes mellitus with neurological manifestations, not stated as uncontrolled(250.60)   . Chronic venous insufficiency   . CHF (congestive heart failure)   . HTN (hypertension)   . History of DVT of lower extremity   . GERD (gastroesophageal reflux disease)   . RA (rheumatoid arthritis)   . Varicose veins of lower extremities with ulcer     Past Medical History   Diagnosis Date   . Diabetes mellitus without complication    . Hypertensive disorder    . Arthritis      rheumatoid     History reviewed. No pertinent past surgical history.    Precautions: falls,  contact precautions    X-Rays/Tests/Labs:  Results for [name], [name] (MRN [id number]) as of 03/20/2012 12:09   Ref. Range 03/19/2012 16:24   Hemoglobin Latest Range: 12.0-16.0 g/dL 63.0 (L)   Hematocrit Latest Range: 37.0-47.0 % 33.8 (L)     CT Abd/Pelvis with PO Contrast (Order #[id number]) on 03/19/2012 - Imaging Information  IMPRESSION:   Findings suspicious for focal inflammation in the splenic   flexure either from diverticulitis or focal colitis. Examination is   otherwise stable.    XR Chest AP Portable (Order #[id number]) on 03/19/2012 - Imaging Information  IMPRESSION:   Mild new linear interstitial lung disease as compared to May   29 with small bilateral effusions. Findings are likely due to mild   CHF/fluid overload    Social History:  Lives alone in a senior apartment [facility]).  Entry Steps: none   Rails: n/a Inside steps: none  Rails: n/a  Equipment at home: rollator, tub shower w/ tub bench and grab bars, elevated commode w/ grab bars, reacher, sock-aide  Prior Level of Function:  Pt has caregiver in the AM 5 days/week to assist w/ bathing and dressing, household ambulator although pt reports she drives herself to MD appts, gets Meals on Wheels.    Cognition: intact    Mobility/Locomotion: mod I w/ rollator   Feeding: independent   Grooming: independent   Bathing: assist required   Dressing: assist required   Toileting: independent    Subjective: Patient is  agreeable to participation in the therapy session. Nursing clears patient for therapy.  Pain: Pt reports 7/10 pani in LLE at wound site.    Objective:   Patient is in bed with  Intravenous (IV) and Sequential Compression Device (SCD) RLE in place.    Observation of patient/vitals:  Filed Vitals:    03/19/12 1918 03/19/12 2140 03/19/12 2312 03/20/12 0810   BP: 133/59 99/50 125/55 129/50   Pulse: 91 84 76 71   Temp: 99.8 F (37.7 C)  99 F (37.2 C) 98.6 F (37 C)   Resp: 18 18 18 18    SpO2: 96% 100% 93% 90%      Orientation/Cognition:  Alert and Oriented x 4  Cognition: appears intact, no difficulty following commands.    Musculoskeletal Examination:     ROM Strength   Neck/ Trunk WFL WFL   RLE WFL 3+/5   LLE WFL 3+/5     Sensation: Intact to light touch and localization BLEs  Coordination: appears intact    Functional Mobilty:  Rolling: independent    Supine to sit: SBA  Scooting:SBA  Sit to Supine: NT  Sit to stand: min A of 2  Stand to sit: min A  Transfers: NT  W/C Mobility: NT  Ambulation:     Weightbearing: FWB   Assistance level: min A   Distance: 5 steps   Assistive Device: FWW   Gait Deviations: slow cadence, shuffling gait, forward flexed posture, slightly antalgic gait favoring LLE   Stairs: NT    Balance:  Static Sit: good  Dynamic Sit: good  Static Stand: fair  Dynamic Stand: fair-    Endurance: fair    Participation:  good    Education:  Educated the patient to role of physical therapy, plan of care, goals  of therapy and safety with mobility and ADLs, home safety.    Assessment:  [name] is a 78 y.o. female admitted 03/19/2012.  Pt's ability to perform functional mobility is impaired due to the following deficits:  Decreased strength, impaired gait and balance, decreased endurance, pain.  Pt would continue to benefit from PT to address these deficits and increase functional independence.     Rehabilitation Potential: good     Patient is in beside chair with Intravenous (IV), and call bell within reach. RN notified of session outcome.    G codes:     Mobility G Code Set  Mobility, Current Status [phone]): At least 40 percent but less than 60 percent impaired, limited or restricted  Mobility, Goal Status (D6644): At least 20 percent but less than 40 percent impaired, limited or restricted  Tools used to determine level of impairment: other (comment) (Clinical Judgement)    Patient's current impairment level is: Mobility, Current Status [phone]): CK    Patient is expected to achieve: Mobility, Goal  Status (Q5956): CJ    Therapy Diagnosis: gait impairment, decreased balance    Plan:   Risks/Benefits/POC Discussed with Pt/Family: With patient  Treatment/Interventions: Exercise;Gait training;Neuromuscular re-education;Functional transfer training;LE strengthening/ROM;Bed mobility;Endurance training  PT Frequency: 2-3x/wk    Goals  Goal Formulation: With patient  Time for Goal Acheivement: 3 visits  Pt Will Demo Standing Balance: 3/5 indep without support for up to 30 seconds  Pt Will Transfer Bed/Chair: With verbal prompts required/provided;to maximize functional mobility and independence  Pt Will Ambulate: 101-150 feet;with rolling walker;With verbal prompts required/provided;to maximize functional mobility and independence    D/C Suggestions:  HHPT, home w/ supervision, increased assist from caregivers.  Equipment Recommendation:   Pt w/ all necessary equipment at this time.    Signature:  Prince Solian, PT  03/20/2012  12:08 PM  Phone: [phone]    Attention MD:   Thank you for allowing [place] to participate in the care of [name]. Regulations from the [employer] for Medicare and Medicaid Services (CMS) require your review and approval of this plan of care.     Please cosign this note indicating you are in agreement with the PT Plan of Care so we may initiate the therapy treatment plan

## 2012-03-21 ENCOUNTER — Inpatient Hospital Stay: Payer: Medicare Other

## 2012-03-21 LAB — GFR: EGFR: 50.6

## 2012-03-21 LAB — CBC AND DIFFERENTIAL
Basophils Absolute Automated: 0.02 10*3/uL (ref 0.00–0.20)
Basophils Automated: 0 % (ref 0–2)
Eosinophils Absolute Automated: 0.44 10*3/uL (ref 0.00–0.70)
Eosinophils Automated: 4 % (ref 0–5)
Hematocrit: 26.2 % — ABNORMAL LOW (ref 37.0–47.0)
Hgb: 8.5 g/dL — ABNORMAL LOW (ref 12.0–16.0)
Immature Granulocytes Absolute: 0.04 10*3/uL
Immature Granulocytes: 0 % (ref 0–1)
Lymphocytes Absolute Automated: 1.48 10*3/uL (ref 0.50–4.40)
Lymphocytes Automated: 13 % — ABNORMAL LOW (ref 15–41)
MCH: 29.3 pg (ref 28.0–32.0)
MCHC: 32.4 g/dL (ref 32.0–36.0)
MCV: 90.3 fL (ref 80.0–100.0)
MPV: 10.7 fL (ref 9.4–12.3)
Monocytes Absolute Automated: 0.91 10*3/uL (ref 0.00–1.20)
Monocytes: 8 % (ref 0–11)
Neutrophils Absolute: 8.2 10*3/uL — ABNORMAL HIGH (ref 1.80–8.10)
Neutrophils: 74 % (ref 52–75)
Nucleated RBC: 0 /100 WBC (ref 0–1)
Platelets: 173 10*3/uL (ref 140–400)
RBC: 2.9 10*6/uL — ABNORMAL LOW (ref 4.20–5.40)
RDW: 15 % (ref 12–15)
WBC: 11.05 10*3/uL — ABNORMAL HIGH (ref 3.50–10.80)

## 2012-03-21 LAB — BASIC METABOLIC PANEL
BUN: 25 mg/dL — ABNORMAL HIGH (ref 7–21)
CO2: 23 mEq/L (ref 22–29)
Calcium: 8 mg/dL (ref 7.9–10.6)
Chloride: 110 mEq/L — ABNORMAL HIGH (ref 98–107)
Creatinine: 1.2 mg/dL — ABNORMAL HIGH (ref 0.6–1.0)
Glucose: 88 mg/dL (ref 70–100)
Potassium: 4.4 mEq/L (ref 3.5–5.1)
Sodium: 139 mEq/L (ref 136–145)

## 2012-03-21 LAB — POCT GLUCOSE
Whole Blood Glucose POCT: 96 mg/dL (ref 70–100)
Whole Blood Glucose POCT: 101 mg/dL — AB (ref 70–100)
Whole Blood Glucose POCT: 191 mg/dL — AB (ref 70–100)
Whole Blood Glucose POCT: 233 mg/dL — AB (ref 70–100)

## 2012-03-21 MED ORDER — FUROSEMIDE 40 MG PO TABS
40.00 mg | ORAL_TABLET | Freq: Every day | ORAL | Status: DC
Start: 2012-03-22 — End: 2012-03-25
  Administered 2012-03-22 – 2012-03-25 (×4): 40 mg via ORAL
  Filled 2012-03-21 (×4): qty 1

## 2012-03-21 MED ORDER — METRONIDAZOLE 250 MG PO TABS
500.00 mg | ORAL_TABLET | Freq: Three times a day (TID) | ORAL | Status: DC
Start: 2012-03-21 — End: 2012-03-25
  Administered 2012-03-21 – 2012-03-25 (×12): 500 mg via ORAL
  Filled 2012-03-21 (×12): qty 2

## 2012-03-21 MED ORDER — FUROSEMIDE 40 MG PO TABS
40.00 mg | ORAL_TABLET | Freq: Once | ORAL | Status: AC
Start: 2012-03-21 — End: 2012-03-21
  Administered 2012-03-21: 40 mg via ORAL
  Filled 2012-03-21: qty 1

## 2012-03-21 MED ORDER — CIPROFLOXACIN HCL 250 MG PO TABS
500.00 mg | ORAL_TABLET | Freq: Two times a day (BID) | ORAL | Status: DC
Start: 2012-03-21 — End: 2012-03-25
  Administered 2012-03-21 – 2012-03-25 (×8): 500 mg via ORAL
  Filled 2012-03-21 (×8): qty 2

## 2012-03-21 NOTE — OT Progress Note (Signed)
[facility]  [address]  [place] [address]  [phone]    Occupational Therapy Treatment    Patient: [name]    MRN#: [id number]  Unit: 4B ORHTOPEDICS, SURGICAL, PEDIATRICS         Bed: H846/N629-52    Time of treatment:    Time Calculation  OT Received On: 03/21/12  Start Time: 1340  Stop Time: 1420  Time Calculation (min): 40 min    Treatment #  OT Visit Number: 1/5    Precautions and Contraindications:  Falls  Contact isolation     Subjective:   Patient's medical condition is appropriate for Occupational Therapy  intervention at this time.  Patient is agreeable to participation in the therapy session. Nursing clears patient for therapy.  Pain: Pt reported pain in chest when coughing  Pt reported feeling miserable from the coughing, very fatigued, as well as short of breath    Objective:  Patient is seated in a bedside chair with Intravenous (IV)  in place.  Pt coughed several times during session; had 3 productive coughs during session (yellow)      Cognition:  No deficits noted    Self Care:  Feeding: NT  Grooming: Pt washed face seated in chair with set-up  Bathing: Pt completed UE and LE bathing seated in chair with set-up; required increase time to complete as well as several rest breaks. Pt declined to stand to complete peri/gluteal bathing secondary to fatigue   UB Dressing: set-up  LB Dressing: NT  Toileting: NT   (further self care tasks NT secondary to fatigue and shortness of breath)     Functional Mobility: (pt declined to stand or complete any additional activity secondary to fatigue, shortness of breath, and cough)   Rolling: NT  Scooting: NT  Supine to Sit: NT  Sit to Supine: NT  Sit to Stand: NT  Transfers: NT     B UE Exercises:  Pt was educated on completing elbow flexion/extension, shoulder flexion exercises     Treatment Activities: bathing, grooming, dressing, education on BUE exercises and pursed lip breathing    Observation of Patient:  Vitals: pt's oxygen  on room air at rest: 97%. Pt's oxygen on room air with activity ran in the low 90's.     Filed Vitals:    03/20/12 2047 03/21/12 0012 03/21/12 0803 03/21/12 1300   BP: 117/50 154/65 125/53    Pulse: 80 73 69    Temp: 98.6 F (37 C) 97.8 F (36.6 C) 98.4 F (36.9 C)    TempSrc: Oral Oral Oral    Resp: 16 18 18     Height:    1.753 m (5' 9.02")   Weight:    67.132 kg (148 lb)   SpO2: 94% 94% 92%        Education:   Educated the patient to role of occupational therapy, plan of care,  goals of therapy and HEP, safety with mobility and ADLs, energy conservation techniques, pursed lip breathing.    Patient left in bedside chair with all needs met, equipment intact and call bell within reach. RN notified of session outcome.    Assessment:  Fatigue, shortness of breath, and productive cough noted new this session. Pt had very decreased activity tolerance today and required several rest breaks to complete task.  Pt also continues to be limited by decreased strength and deficits in mobility and balance. Pt would continue to benefit from OT to address these deficits and  increase independence with ADLs and functional transfers.     Goals:  Time For Goal Achievement: 5 visits  ADL Goals  Patient will dress lower body: with minimal assist;with AE  Pt will complete bathing: With minimal assist  Patient will toilet: with supervision  Mobility and Transfer Goals  Pt will make functional transfers: with supervision     OT Plan  Treatment Interventions: ADL retraining;Functional transfer training;UE strengthening/ROM;Endurance training;Patient/Family training;Equipment eval/education  OT Frequency Recommended: 3-4x/wk  D/C Recommendation: HHOT; 24 hour supervision; increased assist from caregivers   Equipment: TBA    Continue plan of care.    Signature:   Louie Boston, OT  03/21/2012 2:26 PM   Phone: [phone]

## 2012-03-21 NOTE — Progress Notes (Signed)
Spoke with dr Mont Dutton about pt as she is still vry weak wbc still elev and H+H tending down and Bun Cre still elev improving   Pt very weak and was sob with  Pt today   IV now reduced to 100 cch because slight diminished LS in bases and hx of chf  PT/OT will continue to work with pt   Stool c diff pending  Wound care to see pt re lag wraps  Will continue to follow

## 2012-03-21 NOTE — Progress Notes (Signed)
Patient helped back to bed per request with mod assist and walker. Complains of productive cough, yellow thick secretions. Dinner bs 191, 1 unit coverage given. Warm blankets offered and repositioned. Isolation precautions maintained. Safety maintained bed in low position and call light within reach.

## 2012-03-21 NOTE — Progress Notes (Signed)
PROGRESS NOTE    Date Time: 03/21/2012 5:34 PM  Patient Name: [name], [name]      Assessment/ Plan:   Active Problems:  Cough/ Pulmonary congestion, fluid overload- stop IVF, give lasix, monitor.   Acute Kidney Injury/ Dehydration- better after IVF.    Acute Diverticulitis/ Colitis- continue ABx. Mild. Eating, no fever. Ok to switch to PO.  History of coronary artery disease without intervention.   CKD stage 3   Left Leg Venous Stasis Ulcer- wound care nursing consult.   Lipidemia.   Type 2 diabetes, controlled, insulin dependent   Rheumatoid arthritis on prednisone.   History of DVT with placement of IVC filter.   Degenerative joint disease.   Hypertension.   CHF.   GERD.   DNR/I          Subjective:   Good appetite. Coughing with congestion today after IVF.     Medications:     Current Facility-Administered Medications   Medication Dose Route Frequency   . amLODIPine  5 mg Oral Daily   . aspirin EC  81 mg Oral Daily   . [COMPLETED] furosemide  40 mg Oral Once   . gabapentin  600 mg Oral Q12H SCH   . insulin glargine  20 Units Subcutaneous QHS   . magnesium oxide  400 mg Oral Daily   . metroNIDAZOLE  500 mg Intravenous Q8H SCH   . pantoprazole  40 mg Oral QAM AC   . piperacillin-tazobactam  2.25 g Intravenous Q8H   . predniSONE  5 mg Oral Daily   . sucralfate  1 g Oral Daily before lunch       Review of Systems:   A comprehensive review of systems was: History obtained from the patient  General ROS: negative  Psychological ROS: negative  Respiratory ROS: positive for - cough, shortness of breath and sputum changes  Cardiovascular ROS: no chest pain or dyspnea on exertion  Gastrointestinal ROS: no abdominal pain, change in bowel habits, or black or bloody stools  Genito-Urinary ROS: no dysuria, trouble voiding, or hematuria  Musculoskeletal ROS: negative  Neurological ROS: no TIA or stroke symptoms  Dermatological ROS: negative    Physical Exam:     Filed Vitals:    03/21/12 0803   BP: 125/53   Pulse: 69   Temp:  98.4 F (36.9 C)   Resp: 18   SpO2: 92%       Intake and Output Summary (Last 24 hours) at Date Time    Intake/Output Summary (Last 24 hours) at 03/21/12 1734  Last data filed at 03/21/12 0001   Gross per 24 hour   Intake      0 ml   Output    825 ml   Net   -825 ml     General appearance - oriented to person, place, and time, ill-appearing and weak   Mental status - normal mood, behavior, speech, dress, motor activity, and thought processes   Chest - wet crackles on 2/3 lower lung zones.  Heart - normal rate, regular rhythm, normal S1, S2, no murmurs, rubs, clicks or gallops   Abdomen - tenderness noted left lower quadrant, no guarding, no rebound tenderness   Neurological - alert, oriented, normal speech, no focal findings or movement disorder noted, generalized weakness   Extremities - peripheral pulses normal, no pedal edema, no clubbing or cyanosis, venous stasis ulcer on left leg, dsg in place, no redness, warmth or pus  Labs:     Results     Procedure Component Value Units Date/Time    POCT glucose (AC and HS) [[id number]]  (Abnormal) Collected:03/21/12 1625     POCT Glucose WB 191 (A) mg/dL [id number]:14/78/29 5621    POCT glucose (AC and HS) [[id number]]  (Abnormal) Collected:03/21/12 1111     POCT Glucose WB 101 (A) mg/dL NGEXBMW:41/32/44 0102    POCT glucose (AC and HS) [[id number]] Collected:03/21/12 0615     POCT Glucose WB 96 mg/dL HKVQQVZ:56/38/75 6433    Basic Metabolic Panel [[id number]]  (Abnormal) Collected:03/21/12 0422    Specimen Information:Blood Updated:03/21/12 0557     Glucose 88 mg/dL      BUN 25 (H) mg/dL      Creatinine 1.2 (H) mg/dL      Calcium 8.0 mg/dL      Sodium 606 mEq/L      Potassium 4.4 mEq/L      Chloride 110 (H) mEq/L      CO2 23 mEq/L     GFR [[id number]] Collected:03/21/12 0422     EGFR 50.6 Updated:03/21/12 0557    CBC and differential [[id number]]  (Abnormal) Collected:03/21/12 0422    Specimen Information:Blood / Blood Updated:03/21/12 0439     WBC 11.05 (H) x10 3/uL       RBC 2.90 (L) x10 6/uL      Hgb 8.5 (L) g/dL      Hematocrit 25.4 (L) %      MCV 90.3 fL      MCH 29.3 pg      MCHC 32.4 g/dL      RDW 15 %      Platelets 173 x10 3/uL      MPV 10.7 fL      Neutrophils 74 %      Lymphocytes Automated 13 (L) %      Monocytes 8 %      Eosinophils Automated 4 %      Basophils Automated 0 %      Immature Granulocyte 0 %      Nucleated RBC 0 /100 WBC      Neutrophils Absolute 8.20 (H) x10 3/uL      Abs Lymph Automated 1.48 x10 3/uL      Abs Mono Automated 0.91 x10 3/uL      Abs Eos Automated 0.44 x10 3/uL      Absolute Baso Automated 0.02 x10 3/uL      Absolute Immature Granulocyte 0.04 x10 3/uL     Urine culture [[id number]] Collected:03/19/12 1750    Specimen Information:Urine / Urine, Catheterized, Foley Updated:03/21/12 0034    Narrative:    ORDER#: [id number]                                    ORDERED BY: HE, ALBERT  SOURCE: Urine, Catheterized, Foley                   COLLECTED:  03/19/12 17:50  ANTIBIOTICS AT COLL.:                                RECEIVED :  03/19/12 21:02  Culture Urine                              FINAL       03/21/12 00:34  03/21/12   No growth of >1,000 CFU/ML, No further work      POCT glucose [facility] and HS) [[id number]]  (Abnormal) Collected:03/20/12 2113     POCT Glucose WB 162 (A) mg/dL HKVQQVZ:56/38/75 6433              Rads:   Radiological Procedure reviewed  Radiology Results (24 Hour)     Procedure Component Value Units Date/Time    XR Chest AP Portable [[id number]] Collected:03/21/12 1447    Order Status:Completed  Updated:03/21/12 1451    Narrative:    HISTORY: Cough     Single portable view compared to November 20      Moderate bibasilar airspace disease noted. Heart enlarged.     Lung volumes small. Slight blunting of each costophrenic angle       Impression:     Moderate bilateral lower lobe airspace disease, increased  since November 28. New blunting of the costophrenic angles suggesting  bilateral pleural effusions.        Signed by: Pearletha Forge,  MD

## 2012-03-21 NOTE — Progress Notes (Signed)
Attempt to start an IV, failed. Another RN on unit attempted without success. House MD notified to try without success. Called [facility] and they will attempt later today.

## 2012-03-22 DIAGNOSIS — N179 Acute kidney failure, unspecified: Secondary | ICD-10-CM | POA: Diagnosis present

## 2012-03-22 LAB — BASIC METABOLIC PANEL
BUN: 14 mg/dL (ref 7–21)
CO2: 21 mEq/L — ABNORMAL LOW (ref 22–29)
Calcium: 8.3 mg/dL (ref 7.9–10.6)
Chloride: 109 mEq/L — ABNORMAL HIGH (ref 98–107)
Creatinine: 0.9 mg/dL (ref 0.6–1.0)
Glucose: 60 mg/dL — ABNORMAL LOW (ref 70–100)
Potassium: 3.9 mEq/L (ref 3.5–5.1)
Sodium: 138 mEq/L (ref 136–145)

## 2012-03-22 LAB — CBC AND DIFFERENTIAL
Basophils Absolute Automated: 0.02 10*3/uL (ref 0.00–0.20)
Basophils Automated: 0 % (ref 0–2)
Eosinophils Absolute Automated: 0.42 10*3/uL (ref 0.00–0.70)
Eosinophils Automated: 4 % (ref 0–5)
Hematocrit: 27.3 % — ABNORMAL LOW (ref 37.0–47.0)
Hgb: 8.8 g/dL — ABNORMAL LOW (ref 12.0–16.0)
Immature Granulocytes Absolute: 0.05 10*3/uL
Immature Granulocytes: 1 % (ref 0–1)
Lymphocytes Absolute Automated: 1.48 10*3/uL (ref 0.50–4.40)
Lymphocytes Automated: 14 % — ABNORMAL LOW (ref 15–41)
MCH: 29 pg (ref 28.0–32.0)
MCHC: 32.2 g/dL (ref 32.0–36.0)
MCV: 90.1 fL (ref 80.0–100.0)
MPV: 11.1 fL (ref 9.4–12.3)
Monocytes Absolute Automated: 1.16 10*3/uL (ref 0.00–1.20)
Monocytes: 11 % (ref 0–11)
Neutrophils Absolute: 7.61 10*3/uL (ref 1.80–8.10)
Neutrophils: 71 % (ref 52–75)
Nucleated RBC: 0 /100 WBC (ref 0–1)
Platelets: 199 10*3/uL (ref 140–400)
RBC: 3.03 10*6/uL — ABNORMAL LOW (ref 4.20–5.40)
RDW: 15 % (ref 12–15)
WBC: 10.69 10*3/uL (ref 3.50–10.80)

## 2012-03-22 LAB — POCT GLUCOSE
Whole Blood Glucose POCT: 53 mg/dL — AB (ref 70–100)
Whole Blood Glucose POCT: 110 mg/dL — AB (ref 70–100)
Whole Blood Glucose POCT: 275 mg/dL — AB (ref 70–100)
Whole Blood Glucose POCT: 172 mg/dL — AB (ref 70–100)

## 2012-03-22 LAB — GFR: EGFR: >60

## 2012-03-22 NOTE — Progress Notes (Cosign Needed)
Patient continue on isolation precaution for MDR. No acute respiratory distress noted, productive cough present. No complain of pain. On ABT therapy, dressing intact to left leg ulcer, wound consult made. Assisted to [facility] x 1, gait unsteady. Safety maintained

## 2012-03-22 NOTE — PT Progress Note (Signed)
Physical Therapy Note    [facility]  [address]  [place] [address]  [phone]    Physical Therapy Treatment    Patient:  [name]        MRN#:  [id number]  Unit:  4B ORHTOPEDICS, SURGICAL, PEDIATRICS        Room/Bed:  D638/V564-33    Time of treatment:  PT Received On: 03/22/12  Start Time: 1445 Stop Time: 1517  Time Calculation (min): 32 min    Treatment #: PT Visit Number: 1/3    Patient's medical condition is appropriate for Physical Therapy intervention at this time.    Precautions: fall, isolation    Subjective: What am I poisoned or something (referring to isolation status)?  Patient is agreeable to participation in the therapy session. Nursing clears patient for therapy.  Pain: Pt c/o pleuritic/lung pain with coughing (which she states she did not have upon admission)    Vitals: Stable for therapy  Filed Vitals:    03/21/12 1300 03/21/12 2012 03/22/12 0650 03/22/12 0900   BP:  177/84 171/63 156/65   Pulse:  65 71 78   Temp:  97.7 F (36.5 C) 97.7 F (36.5 C) 98.7 F (37.1 C)   TempSrc:   Oral Oral   Resp:  18 18 18    Height: 1.753 m (5' 9.02")      Weight: 67.132 kg (148 lb)      SpO2:  95% 92% 96%       Objective:  Pt supine with IV. Pt with non-productive cough which caused an incontinent episode (BM).    Therapeutic exercises:  Ankle Pumps: 10 B  Long Arc Quad: 10 B  Seated Marches: 10 B    Functional Mobility:  Rolling: indep  Scooting: indep to left EOB  Supine to Sit: Supv  Sit to Supine: NT  Sit to Stand: CG/cues off EOB, Cues/Supv off Toilet with BSC over top to help elevate  Stand to Sit: Supv/Cues  Transfer Training: Multiple reps sit to/from stand with improved mechanics and balance as session progressed    Gait:   WB status: FWB  Assistive Device: RW  Assist Level: CG/Supv  Distance: 2x25 feet     Balance Training:  Able to stand for 30-60 seconds for management of clothing during toileting.    Educated the patient to role of physical therapy, plan of care,  goals  of therapy and HEP, safety with mobility and ADLs, home safety.    Patient left in B/S chair with all needs met, equipment intact and call bell within reach. RN notified of session outcome.    Assessment:  Pt with improved transfer and ambulation status.    Goals per Eval/ Re-eval:   Goals  Goal Formulation: With patient  Time for Goal Acheivement: 3 visits  Goals: Select goal  Pt Will Demo Standing Balance: 3/5 indep without support for up to 30 seconds  Pt Will Transfer Bed/Chair: With verbal prompts required/provided;to maximize functional mobility and independence;Partly met  Pt Will Ambulate: 101-150 feet;with rolling walker;With verbal prompts required/provided;to maximize functional mobility and independence;Partly met    Plan:  Recommendation  Discharge Recommendation: Home with supervision;Home with home health PT  PT - Next Visit Recommendation: 03/24/12  PT Frequency: 3-4x/wk    Continue plan of care.    Signature:  Chester Holstein, PT  03/22/2012 3:19 PM   [id number]

## 2012-03-22 NOTE — Progress Notes (Signed)
PROGRESS NOTE    Date Time: 03/22/2012 12:43 PM  Patient Name: [name],[name]      Subjective:   Alert, tolerating diet. No complaints. Occasional loose cough.    Medications:     Current Facility-Administered Medications   Medication Dose Route Frequency   . amLODIPine  5 mg Oral Daily   . aspirin EC  81 mg Oral Daily   . ciprofloxacin  500 mg Oral Q12H SCH   . [COMPLETED] furosemide  40 mg Oral Once   . furosemide  40 mg Oral Daily   . gabapentin  600 mg Oral Q12H SCH   . insulin glargine  20 Units Subcutaneous QHS   . magnesium oxide  400 mg Oral Daily   . metroNIDAZOLE  500 mg Oral Q8H SCH   . pantoprazole  40 mg Oral QAM AC   . predniSONE  5 mg Oral Daily   . sucralfate  1 g Oral Daily before lunch   . [DISCONTINUED] metroNIDAZOLE  500 mg Intravenous Q8H SCH   . [DISCONTINUED] piperacillin-tazobactam  2.25 g Intravenous Q8H       Review of Systems:   Review of Systems -     General ROS: no fever or chills  Psychological ROS: negative   Respiratory ROS: positive for - cough, no shortness of breath  Cardiovascular ROS: no chest pain or dyspnea on exertion   Gastrointestinal ROS: no abdominal pain, change in bowel habits, or black or bloody stools   Genito-Urinary ROS: no dysuria, trouble voiding, or hematuria   Musculoskeletal ROS: negative   Neurological ROS: no TIA or stroke symptoms     Physical Exam:     Filed Vitals:    03/22/12 0900   BP: 156/65   Pulse: 78   Temp: 98.7 F (37.1 C)   Resp: 18   SpO2: 96%       Intake and Output Summary (Last 24 hours) at Date Time  No intake or output data in the 24 hours ending 03/22/12 1243        Labs:     Results     Procedure Component Value Units Date/Time    POCT glucose (AC and HS) [[id number]]  (Abnormal) Collected:03/22/12 0627     POCT Glucose WB 53 (A) mg/dL WUJWJXB:14/78/29 5621    Basic Metabolic Panel [[id number]]  (Abnormal) Collected:03/22/12 0425    Specimen Information:Blood Updated:03/22/12 0513     Glucose 60 (L) mg/dL      BUN 14 mg/dL      Creatinine  0.9 mg/dL      Calcium 8.3 mg/dL      Sodium 962 mEq/L      Potassium 3.9 mEq/L      Chloride 109 (H) mEq/L      CO2 21 (L) mEq/L     GFR [[id number]] Collected:03/22/12 0425     EGFR >60.0 Updated:03/22/12 0513    CBC and differential [[id number]]  (Abnormal) Collected:03/22/12 0425    Specimen Information:Blood / Blood Updated:03/22/12 0457     WBC 10.69 x10 3/uL      RBC 3.03 (L) x10 6/uL      Hgb 8.8 (L) g/dL      Hematocrit 66.4 (L) %      MCV 90.1 fL      MCH 29.0 pg      MCHC 32.2 g/dL      RDW 15 %      Platelets 199 x10 3/uL      MPV 11.1 fL  Neutrophils 71 %      Lymphocytes Automated 14 (L) %      Monocytes 11 %      Eosinophils Automated 4 %      Basophils Automated 0 %      Immature Granulocyte 1 %      Nucleated RBC 0 /100 WBC      Neutrophils Absolute 7.61 x10 3/uL      Abs Lymph Automated 1.48 x10 3/uL      Abs Mono Automated 1.16 x10 3/uL      Abs Eos Automated 0.42 x10 3/uL      Absolute Baso Automated 0.02 x10 3/uL      Absolute Immature Granulocyte 0.05 x10 3/uL     POCT glucose (AC and HS) [[id number]]  (Abnormal) Collected:03/21/12 2116     POCT Glucose WB 233 (A) mg/dL YNWGNFA:21/30/86 5784    POCT glucose (AC and HS) [[id number]]  (Abnormal) Collected:03/21/12 1625     POCT Glucose WB 191 (A) mg/dL XLKGMWN:02/72/53 6644          Rads:   Radiological Procedure reviewed.  Radiology Results (24 Hour)     Procedure Component Value Units Date/Time    XR Chest AP Portable [[id number]] Collected:03/21/12 1447    Order Status:Completed  Updated:03/21/12 1451    Narrative:    HISTORY: Cough     Single portable view compared to November 20      Moderate bibasilar airspace disease noted. Heart enlarged.     Lung volumes small. Slight blunting of each costophrenic angle       Impression:     Moderate bilateral lower lobe airspace disease, increased  since November 28. New blunting of the costophrenic angles suggesting  bilateral pleural effusions.          Assessment:     Patient Active Problem List    Diagnosis   . Type II or unspecified type diabetes mellitus with neurological manifestations, not stated as uncontrolled(250.60)   . Atherosclerosis of native arteries of the extremities, unspecified   . Type II or unspecified type diabetes mellitus with neurological manifestations, not stated as uncontrolled(250.60)   . Chronic venous insufficiency   . CHF (congestive heart failure)   . HTN (hypertension)   . History of DVT of lower extremity   . GERD (gastroesophageal reflux disease)   . RA (rheumatoid arthritis)   . Varicose veins of lower extremities with ulcer   . Anemia   . Diverticulitis   . AKI (acute kidney injury)   . CKD (chronic kidney disease), stage III           Plan:   The plan for this patient will be to continue current medication and treatment including po AB and Lasix. Monitor glucose with sliding scale coverage. Advance activity as tolerated. Consult ST        Signed by: Ramon Dredge, MD  03/22/2012, 12:43 PM

## 2012-03-22 NOTE — Plan of Care (Signed)
Patient has been free from fall and injury during this shift, denies any pain and no distress noted. Patient has been encouraged to use Bedside commode q2hr  She has been up to [facility] as needed and incontinent care given as needed,  up to chair for an hour and a half. Pt is tolerating diet to promote healing.

## 2012-03-23 LAB — CBC AND DIFFERENTIAL
Basophils Absolute Automated: 0.03 10*3/uL (ref 0.00–0.20)
Basophils Automated: 0 % (ref 0–2)
Eosinophils Absolute Automated: 0.37 10*3/uL (ref 0.00–0.70)
Eosinophils Automated: 4 % (ref 0–5)
Hematocrit: 28.2 % — ABNORMAL LOW (ref 37.0–47.0)
Hgb: 9.2 g/dL — ABNORMAL LOW (ref 12.0–16.0)
Immature Granulocytes Absolute: 0.03 10*3/uL
Immature Granulocytes: 0 % (ref 0–1)
Lymphocytes Absolute Automated: 1.79 10*3/uL (ref 0.50–4.40)
Lymphocytes Automated: 21 % (ref 15–41)
MCH: 29.3 pg (ref 28.0–32.0)
MCHC: 32.6 g/dL (ref 32.0–36.0)
MCV: 89.8 fL (ref 80.0–100.0)
MPV: 10.6 fL (ref 9.4–12.3)
Monocytes Absolute Automated: 0.86 10*3/uL (ref 0.00–1.20)
Monocytes: 10 % (ref 0–11)
Neutrophils Absolute: 5.54 10*3/uL (ref 1.80–8.10)
Neutrophils: 65 % (ref 52–75)
Nucleated RBC: 0 /100 WBC (ref 0–1)
Platelets: 225 10*3/uL (ref 140–400)
RBC: 3.14 10*6/uL — ABNORMAL LOW (ref 4.20–5.40)
RDW: 15 % (ref 12–15)
WBC: 8.59 10*3/uL (ref 3.50–10.80)

## 2012-03-23 LAB — COMPREHENSIVE METABOLIC PANEL
ALT: 13 U/L (ref 0–55)
AST (SGOT): 25 U/L (ref 5–34)
Albumin/Globulin Ratio: 0.5 — ABNORMAL LOW (ref 0.9–2.2)
Albumin: 2.3 g/dL — ABNORMAL LOW (ref 3.5–5.0)
Alkaline Phosphatase: 144 U/L (ref 40–150)
BUN: 13 mg/dL (ref 7–21)
Bilirubin, Total: 0.3 mg/dL (ref 0.2–1.2)
CO2: 26 mEq/L (ref 22–29)
Calcium: 8.6 mg/dL (ref 7.9–10.6)
Chloride: 106 mEq/L (ref 98–107)
Creatinine: 0.9 mg/dL (ref 0.6–1.0)
Globulin: 4.6 g/dL — ABNORMAL HIGH (ref 2.0–3.6)
Glucose: 74 mg/dL (ref 70–100)
Potassium: 3.9 mEq/L (ref 3.5–5.1)
Protein, Total: 6.9 g/dL (ref 6.0–8.3)
Sodium: 140 mEq/L (ref 136–145)

## 2012-03-23 LAB — POCT GLUCOSE
Whole Blood Glucose POCT: 60 mg/dL — AB (ref 70–100)
Whole Blood Glucose POCT: 72 mg/dL (ref 70–100)
Whole Blood Glucose POCT: 62 mg/dL — AB (ref 70–100)
Whole Blood Glucose POCT: 168 mg/dL — AB (ref 70–100)
Whole Blood Glucose POCT: 149 mg/dL — AB (ref 70–100)
Whole Blood Glucose POCT: 178 mg/dL — AB (ref 70–100)
Whole Blood Glucose POCT: 203 mg/dL — AB (ref 70–100)

## 2012-03-23 LAB — GFR: EGFR: >60

## 2012-03-23 MED ORDER — INSULIN GLARGINE 100 UNIT/ML SC SOLN
16.00 [IU] | Freq: Every evening | SUBCUTANEOUS | Status: DC
Start: 2012-03-23 — End: 2012-03-25
  Administered 2012-03-23 – 2012-03-24 (×2): 16 [IU] via SUBCUTANEOUS
  Filled 2012-03-23 (×2): qty 16

## 2012-03-23 MED ORDER — LISINOPRIL 5 MG PO TABS
2.50 mg | ORAL_TABLET | Freq: Every day | ORAL | Status: DC
Start: 2012-03-23 — End: 2012-03-25
  Administered 2012-03-23 – 2012-03-25 (×3): 2.5 mg via ORAL
  Filled 2012-03-23 (×3): qty 1

## 2012-03-23 NOTE — Progress Notes (Signed)
PROGRESS NOTE    Date Time: 03/23/2012 11:41 AM  Patient Name: [name],[name]      Subjective:   Alert, appears comfortable. Up in chair. Cough persists. Hypoglycemic episode early AM    Medications:     Current Facility-Administered Medications   Medication Dose Route Frequency   . amLODIPine  5 mg Oral Daily   . aspirin EC  81 mg Oral Daily   . ciprofloxacin  500 mg Oral Q12H SCH   . furosemide  40 mg Oral Daily   . gabapentin  600 mg Oral Q12H SCH   . insulin glargine  16 Units Subcutaneous QHS   . magnesium oxide  400 mg Oral Daily   . metroNIDAZOLE  500 mg Oral Q8H SCH   . pantoprazole  40 mg Oral QAM AC   . predniSONE  5 mg Oral Daily   . sucralfate  1 g Oral Daily before lunch   . [DISCONTINUED] insulin glargine  20 Units Subcutaneous QHS       Review of Systems:   Review of Systems -   General ROS: no fever or chills   Psychological ROS: negative   Respiratory ROS: positive for - cough, no shortness of breath   Cardiovascular ROS: no chest pain or dyspnea on exertion   Gastrointestinal ROS: positive for diarrhea, no abdominal pain, black or bloody stools   Genito-Urinary ROS: no dysuria, trouble voiding, or hematuria   Musculoskeletal ROS: negative   Neurological ROS: no TIA or stroke symptoms       Physical Exam:     Filed Vitals:    03/23/12 0800   BP: 171/69   Pulse: 66   Temp: 97.2 F (36.2 C)   Resp: 18   SpO2: 96%       Intake and Output Summary (Last 24 hours) at Date Time  No intake or output data in the 24 hours ending 03/23/12 1141    General appearance - oriented to person, place, and time,  Mental status - normal mood, behavior,    Chest - shallow breath sounds at bases, no wheezing  Heart - normal rate, regular rhythm,  Abdomen -no  tenderness. no guarding or rebound  Neurological - alert, oriented, no focal findings or movement disorder noted, generalized weakness   Extremities - venous stasis ulcer on left leg, dsg in place, no redness, warmth or pus       Labs:     Results     Procedure  Component Value Units Date/Time    POCT glucose (AC and HS) [[id number]]  (Abnormal) Collected:03/23/12 0850     POCT Glucose WB 168 (A) mg/dL NGEXBMW:41/32/44 0102    POCT glucose (AC and HS) [[id number]]  (Abnormal) Collected:03/23/12 0748     POCT Glucose WB 62 (A) mg/dL HKVQQVZ:56/38/75 6433    POCT glucose (AC and HS) [[id number]] Collected:03/23/12 0720     POCT Glucose WB 72 mg/dL SAYTKZS:05/08/30 3557    POCT glucose (AC and HS) [[id number]]  (Abnormal) Collected:03/23/12 0620     POCT Glucose WB 60 (A) mg/dL CWCBJSE:83/15/17 6160    Comprehensive metabolic panel [[id number]]  (Abnormal) Collected:03/23/12 0416    Specimen Information:Blood Updated:03/23/12 0500     Glucose 74 mg/dL      BUN 13 mg/dL      Creatinine 0.9 mg/dL      Calcium 8.6 mg/dL      Sodium 485 mEq/L      Potassium 3.9 mEq/L      Chloride  106 mEq/L      CO2 26 mEq/L      Protein, Total 6.9 g/dL      Albumin 2.3 (L) g/dL      AST (SGOT) 25 U/L      ALT 13 U/L      Alkaline Phosphatase 144 U/L      Bilirubin, Total 0.3 mg/dL      Globulin 4.6 (H) g/dL      Albumin/Globulin Ratio 0.5 (L)     GFR [[id number]] Collected:03/23/12 0416     EGFR >60.0 Updated:03/23/12 0500    CBC and differential [[id number]]  (Abnormal) Collected:03/23/12 0416    Specimen Information:Blood / Blood Updated:03/23/12 0438     WBC 8.59 x10 3/uL      RBC 3.14 (L) x10 6/uL      Hgb 9.2 (L) g/dL      Hematocrit 78.2 (L) %      MCV 89.8 fL      MCH 29.3 pg      MCHC 32.6 g/dL      RDW 15 %      Platelets 225 x10 3/uL      MPV 10.6 fL      Neutrophils 65 %      Lymphocytes Automated 21 %      Monocytes 10 %      Eosinophils Automated 4 %      Basophils Automated 0 %      Immature Granulocyte 0 %      Nucleated RBC 0 /100 WBC      Neutrophils Absolute 5.54 x10 3/uL      Abs Lymph Automated 1.79 x10 3/uL      Abs Mono Automated 0.86 x10 3/uL      Abs Eos Automated 0.37 x10 3/uL      Absolute Baso Automated 0.03 x10 3/uL      Absolute Immature Granulocyte 0.03 x10 3/uL     POCT  glucose (AC and HS) [[id number]]  (Abnormal) Collected:03/22/12 2117     POCT Glucose WB 172 (A) mg/dL VHQIONG:29/52/84 1324    POCT glucose (AC and HS) [[id number]]  (Abnormal) Collected:03/22/12 1735    Specimen Information:Blood Updated:03/22/12 1931     POCT Glucose WB 275 (A) mg/dL     POCT glucose (AC and HS) [[id number]]  (Abnormal) Collected:03/22/12 1204    Specimen Information:Blood Updated:03/22/12 1257     POCT Glucose WB 110 (A) mg/dL           Rads:   Radiological Procedure reviewed.  Radiology Results (24 Hour)     ** No Results found for the last 24 hours. **          Assessment:     Patient Active Problem List   Diagnosis   . Type II or unspecified type diabetes mellitus with neurological manifestations, not stated as uncontrolled(250.60)   . Atherosclerosis of native arteries of the extremities, unspecified   . Type II or unspecified type diabetes mellitus with neurological manifestations, not stated as uncontrolled(250.60)   . Chronic venous insufficiency   . CHF (congestive heart failure)   . HTN (hypertension)   . History of DVT of lower extremity   . GERD (gastroesophageal reflux disease)   . RA (rheumatoid arthritis)   . Varicose veins of lower extremities with ulcer   . Anemia   . Diverticulitis   . AKI (acute kidney injury)   . CKD (chronic kidney disease), stage III  Plan:   The plan for this patient will be to continue current medication and antibiotics. Stool pending for C.diff. Add ACEI and monitor B/P and renal fxn. Adjust insulin regimen. Repeat CXR. Swallowing eval.        Signed by: Ramon Dredge, MD  03/23/2012, 11:41 AM

## 2012-03-23 NOTE — Progress Notes (Addendum)
Pt alert oriented cooperative,had one episode of moist non  productive cough   This evening causing pain in right anterior chest  Pain was relieved with tramadol with tylenol   accucheck at 6am was 60,snack given,no s/s of hypoglycemia (pt also had a snack at bedtime  7:45  On round pt was found eyes open ,not verbally responsive, not following command Blood sugar which after am snack was 72 is now down to 62 .dextrose given iv  Now more alert and follow simple command   Zee day shift RN to follow with monitoring

## 2012-03-24 ENCOUNTER — Inpatient Hospital Stay: Payer: Medicare Other

## 2012-03-24 ENCOUNTER — Ambulatory Visit: Payer: Medicare Other

## 2012-03-24 LAB — POCT GLUCOSE
Whole Blood Glucose POCT: 89 mg/dL (ref 70–100)
Whole Blood Glucose POCT: 198 mg/dL — AB (ref 70–100)
Whole Blood Glucose POCT: 171 mg/dL — AB (ref 70–100)
Whole Blood Glucose POCT: 227 mg/dL — AB (ref 70–100)

## 2012-03-24 MED ORDER — GUAIFENESIN ER 600 MG PO TB12
600.00 mg | ORAL_TABLET | Freq: Two times a day (BID) | ORAL | Status: DC
Start: 2012-03-24 — End: 2012-03-25
  Administered 2012-03-24 – 2012-03-25 (×3): 600 mg via ORAL
  Filled 2012-03-24 (×3): qty 1

## 2012-03-24 NOTE — PT Progress Note (Signed)
Physical Therapy Note    [facility]  [address]  [place] [address]  [phone]    Physical Therapy Treatment    Patient:  [name]        MRN#:  [id number]  Unit:  4B ORHTOPEDICS, SURGICAL, PEDIATRICS        Room/Bed:  V564/P329-51    Time of treatment:  PT Received On: 03/24/12  Start Time: 1300 Stop Time: 1334  Time Calculation (min): 34 min    Treatment #: PT Visit Number: 2/3    Patient's medical condition is appropriate for Physical Therapy intervention at this time.    Precautions: fall, isolation    Subjective: Pt states she is still very concerned about her diarrhea. She is willing to go to a SNF if needed.  Patient is agreeable to participation in the therapy session. Nursing clears patient for therapy.  Pain: none reported other than the usual joint pain.    Vitals: Stable for therapy  Filed Vitals:    03/23/12 0800 03/23/12 1400 03/23/12 2130 03/24/12 1031   BP: 171/69 135/58 155/75 140/66   Pulse: 66 81 76 80   Temp: 97.2 F (36.2 C) 97.4 F (36.3 C) 97.2 F (36.2 C) 98.2 F (36.8 C)   TempSrc: Oral Oral     Resp: 18 18 19 24    Height:       Weight:       SpO2: 96% 96% 100% 97%       Objective:  Pt seated with incontinent episode in diaper, IV, multi-layer compression wrap LLE.  Therapeutic exercises:  Ankle Pumps: 10 B  Heel Slides: 10B  Hip abduction/adduction: 10B  Long Arc Quad: 10 B    Functional Mobility:  Rolling: Indep  Scooting: Indep  Supine to Sit: NT  Sit to Supine: Indep  Sit to Stand: CG/Supv with increased time. Needed to place extra pillow under hips as pt raised up from chair after 2 hours due to pt very stiff and feet sliding out from under BOS unless blocked  Stand to Sit: Min A/CG and cues  Transfer Training: Improved over session, but still very slow and cumbersome.    Gait:   WB status: FWB  Assistive Device: RW  Assist Level: CG/Supv  Distance: 25 feet and 100 feet   Stairs/Curbs: NT  Balance Training:  Able to stand for >30 seconds several  times with RW or other HHA     Educated the patient to role of physical therapy, plan of care, goals  of therapy and HEP, safety with mobility and ADLs, home safety.    Patient left in bed with all needs met, equipment intact and call bell within reach. RN notified of session outcome.    Assessment:  Pt still below baseline functional mobility status. Pt with possible risk of dehydration due to dysphagia diet/nectar liquids and diarrhea and increased fall risk due to sudden incontinent episodes. Current status    Goals per Eval/ Re-eval:   Goals  Goal Formulation: With patient  Time for Goal Acheivement: 3 visits  Goals: Select goal  Pt Will Demo Standing Balance: 3/5 indep without support for up to 30 seconds;Goal met  Pt Will Transfer Bed/Chair: With verbal prompts required/provided;to maximize functional mobility and independence;Partly met  Pt Will Ambulate: 101-150 feet;with rolling walker;With verbal prompts required/provided;to maximize functional mobility and independence;Partly met    Plan:  Recommendation  Discharge Recommendation: Home with supervision;Home with home health PT  PT -  Next Visit Recommendation: 03/24/12  PT Frequency: 3-4x/wk    Continue plan of care.    Signature:  Chester Holstein, PT  03/24/2012 1:35 PM   [id number]

## 2012-03-24 NOTE — Progress Notes (Signed)
Incontinent of soft brown stool, c-diff sample sent.

## 2012-03-24 NOTE — SLP Eval Note (Signed)
[facility]    Speech and Language Therapy Bedside Swallow Evaluation     Patient: [name]    MRN#: [id number]     Date/Time of Treatment:   Time Calculation  SLP Received On: 03/24/12  Start Time: 0745  Stop Time: 0845  Time Calculation (min): 60 min    Consult received for [name] for SLP Bedside Swallow Evaluation and Treatment.    Medical Diagnosis: Diverticulitis [562.11]  GI bleeding [578.9]  Acute renal insufficiency [593.9]  [id number]  94106Diverticulitis94106  [id number] Colitis, [id number]    History of Present Illness: [name] is a 78 y.o. female admitted on 03/19/2012 with diverticulitis, GI bleeding, acute renal insufficiency.  Patient Active Problem List   Diagnosis   . Type II or unspecified type diabetes mellitus with neurological manifestations, not stated as uncontrolled(250.60)   . Atherosclerosis of native arteries of the extremities, unspecified   . Type II or unspecified type diabetes mellitus with neurological manifestations, not stated as uncontrolled(250.60)   . Chronic venous insufficiency   . CHF (congestive heart failure)   . HTN (hypertension)   . History of DVT of lower extremity   . GERD (gastroesophageal reflux disease)   . RA (rheumatoid arthritis)   . Varicose veins of lower extremities with ulcer   . Anemia   . Diverticulitis   . AKI (acute kidney injury)   . CKD (chronic kidney disease), stage III        Past Medical/Surgical History:  Past Medical History   Diagnosis Date   . Diabetes mellitus without complication    . Hypertensive disorder    . Arthritis      rheumatoid      History reviewed. No pertinent past surgical history.      History/Current Status:  History/Current Status  Respiratory Status: O2 via nasal cannula  Behavior/Mental Status: Alert;Able to follow directions;Cooperative  Nutrition: oral  Diet Prior to Study: regular;thin liquids    Subjective: Patient is agreeable to participation in the therapy session.  Patient's medical condition is appropriate for Speech therapy intervention at this time.    Objective:  Observation of Patient/Vital Signs:  Patient is in bed. Completed bedside swallow evaluation and provided education regarding swallow, aspiration, swallow strategies and precautions.    Oral Motor Skills:  Oral Motor Skills: exceptions to [facility]  Oral Motor Impairments: strength    Deglutition Skills:  Position: upright 90 degrees  Food(s) Tested: thin liquid;nectar thick liquid;puree;solid  Oral Stage: chewing reduced, slow but effective;residuals  Oral Stage Residuals: cleared;spontaneous swallow;prompted swallow;liquid assist  Pharyngeal Stage: delayed response;throat clearing;vocal quality change;delayed strong cough (with thin liquids, WFL for nectars, purees, soft solids)  Esophageal Stage: appears within functional limits    Assessment:   Pt is awake and alert. Pt presents with mild oral-pharyngeal dysphagia, yet swallow is [facility] for nectar like liquids, purees and soft solids. Adequate timing and laryngeal elevation noted. No cough, change in vocal quality or other s/s of aspiration observed with above. S/s of aspiration noted with thin liquids, including a min delay, wet vocal quality, throat clear and min delayed cough. Mastication is slow, yet effective for soft solids. Min oral residuals cleared with a successive swallow and liquid assist.    Goals:  Patient will tolerate a dysphagia mechanically altered diet and nectar like liquids, 3 meals per day with close 1:1 supervision.   Patient will use swallow strategies/ precautions to increase swallow safety with min cues.  Plan/Recommendations:  begin/continue oral diet;dysphagia treatment;patient/family education  Follow up treatments: diet tolerance monitoring;strategies training  Duration of Treatment: 1 week  Diet Solids Recommendation: dysphagia/mechanically altered (nectar like liquids)     Precautions/Compensations: Awake/alert;Upright 90 degrees  for all oral intake;45 degrees upright after meals;Swallow 2 times per bite/sip;Full supervision with meals;Alternate solids and liquids;Small bites/sips;Eat/feed slowly  Recommended Form of Meds: Crushed;With puree  Recommendation Discussed With: : Patient;Physician;Nurse;Posted bedside    Aspiration Precautions posted at bedside.    Stefan Church, M.S. CCC-SLP

## 2012-03-24 NOTE — Progress Notes (Signed)
Pt OOB to chair.  To CXR this am.  Left LE wound changed by wound RN, wrapped in profore.  Pt had BM, not loose.  Swallow eval completed, diet changed to mechanically altered, nectar thick liquids.  Safety maintained.

## 2012-03-24 NOTE — Progress Notes (Signed)
Pt had uneventful night, no complains, no diarrhea overnight therefore c-diff sample not sent. Pt with cough which she reported gets worse for a while after she takes her antibiotics. Will continue to monitor for changes in condition and intervene following the plan of care.

## 2012-03-24 NOTE — Consults (Signed)
Pt admitted for diarrhea, N&V.  Pt has left leg wound (pt not sure of etiology) for which she is followed by Dr. Barton Dubois at the wound care center.  She has a Profore wrap on which is due to be changed today.  Spoke with Jeni Salles, PA at the wound center and received verbal orders for wound care.  Pt has a wound on the left medial calf (2x1.2x0.1cm).  Periwound is pink healed tissue.  Pt says she normally has a large amount of drainage but it has decreased with being in bed in hospital.  Old dressing dry and difficult to remove.  Wound cleaned with NS; Biatain foam, calcium alginate and Profore lite applied.  Pt had no edema in left leg.  Also has a dry scab over right great toe that Dr. Barton Dubois has been monitoring.  No drainage. Clean bandage applied.

## 2012-03-24 NOTE — Progress Notes (Signed)
PROGRESS NOTE    Date Time: 03/24/2012 11:41 AM  Patient Name: [name], [name]      Assessment/ Plan:   Active Problems:  Cough/ Pulmonary congestion, fluid overload- improving, continue PO lasix. Possibly aspirating. ST evaluation appreciated. Started on dysphagia diet. CXR shows improvement. Start mucinex.  Acute Kidney Injury/ Dehydration- better after IVF. Now eating well.  Acute Diverticulitis/ Colitis- continue ABx. Mild. Eating, no fever. Rule out C diff.  History of coronary artery disease without intervention.   CKD stage 3   Left Leg Venous Stasis Ulcer- wound care nursing on board.   Lipidemia.   Type 2 diabetes, controlled, insulin dependent - lantus dose decreased due to hypoglycemia.   Rheumatoid arthritis on prednisone.   History of DVT with placement of IVC filter.   Degenerative joint disease.   Hypertension.   CHF.   GERD.   DNR/I    Dispo- plan for [place] tomorrow.      Subjective:   Feels weak but OK. Tolerating diet. Wants to go home tomorrow.    Medications:     Current Facility-Administered Medications   Medication Dose Route Frequency   . amLODIPine  5 mg Oral Daily   . aspirin EC  81 mg Oral Daily   . ciprofloxacin  500 mg Oral Q12H SCH   . furosemide  40 mg Oral Daily   . gabapentin  600 mg Oral Q12H SCH   . guaiFENesin  600 mg Oral Q12H SCH   . insulin glargine  16 Units Subcutaneous QHS   . lisinopril  2.5 mg Oral Daily   . magnesium oxide  400 mg Oral Daily   . metroNIDAZOLE  500 mg Oral Q8H SCH   . pantoprazole  40 mg Oral QAM AC   . predniSONE  5 mg Oral Daily   . sucralfate  1 g Oral Daily before lunch       Review of Systems:   A comprehensive review of systems was: History obtained from the patient  General ROS: negative  Psychological ROS: negative  Respiratory ROS: positive for - cough, shortness of breath and sputum changes  Cardiovascular ROS: no chest pain or dyspnea on exertion  Gastrointestinal ROS: no abdominal pain, change in bowel habits, or black or bloody  stools  Genito-Urinary ROS: no dysuria, trouble voiding, or hematuria  Musculoskeletal ROS: negative  Neurological ROS: no TIA or stroke symptoms  Dermatological ROS: negative    Physical Exam:     Filed Vitals:    03/24/12 1031   BP: 140/66   Pulse: 80   Temp: 98.2 F (36.8 C)   Resp: 24   SpO2: 97%       Intake and Output Summary (Last 24 hours) at Date Time  No intake or output data in the 24 hours ending 03/24/12 1141  General appearance - oriented to person, place, and time, ill-appearing and weak   Mental status - normal mood, behavior, speech, dress, motor activity, and thought processes   Chest - wet crackles on 2/3 lower lung zones.  Heart - normal rate, regular rhythm, normal S1, S2, no murmurs, rubs, clicks or gallops   Abdomen - tenderness noted left lower quadrant, no guarding, no rebound tenderness   Neurological - alert, oriented, normal speech, no focal findings or movement disorder noted, generalized weakness   Extremities - peripheral pulses normal, no pedal edema, no clubbing or cyanosis, venous stasis ulcer on left leg, dsg in place, no redness, warmth or pus  Labs:     Results     Procedure Component Value Units Date/Time    POCT glucose (AC and HS) [[id number]] Collected:03/24/12 0630     POCT Glucose WB 89 mg/dL OVFIEPP:29/51/88 4166    POCT Glucose [[id number]]  (Abnormal) Collected:03/23/12 2130     POCT Glucose WB 203 (A) mg/dL XNATFTD:32/20/25 4270    POCT Glucose [[id number]]  (Abnormal) Collected:03/23/12 1710     POCT Glucose WB 178 (A) mg/dL DVVOHYW:73/71/06 2694    POCT Glucose [[id number]]  (Abnormal) Collected:03/23/12 1145     POCT Glucose WB 149 (A) mg/dL KKXFGHW:29/93/71 6967              Rads:   Radiological Procedure reviewed  Radiology Results (24 Hour)     Procedure Component Value Units Date/Time    XR Chest 2 Views [[id number]] Collected:03/24/12 1007    Order Status:Completed  Updated:03/24/12 1024    Narrative:    INDICATION: Cough     TECHNIQUE: Two-view chest      COMPARISON: 03/21/2012     FINDINGS: The lungs hypoinflated with associated bronchovascular  crowding. Patchy airspace disease at the right base with mild  improvement. Stable blunting of the costophrenic angles. Heart size  unchanged. A pneumothorax. Senescent changes of the aorta.     Unchanged degenerative changes  thoracic spine with compression  deformity midthoracic to the body. IVC filter in place.       Impression:     Mild improvement in predominantly right basilar airspace  disease with small pleural effusions.         Signed by: Pearletha Forge, MD

## 2012-03-25 LAB — CBC AND DIFFERENTIAL
Basophils Absolute Automated: 0.04 10*3/uL (ref 0.00–0.20)
Basophils Automated: 0 % (ref 0–2)
Eosinophils Absolute Automated: 0.26 10*3/uL (ref 0.00–0.70)
Eosinophils Automated: 4 % (ref 0–5)
Hematocrit: 27.6 % — ABNORMAL LOW (ref 37.0–47.0)
Hgb: 8.9 g/dL — ABNORMAL LOW (ref 12.0–16.0)
Immature Granulocytes Absolute: 0.04 10*3/uL
Immature Granulocytes: 1 % (ref 0–1)
Lymphocytes Absolute Automated: 1.74 10*3/uL (ref 0.50–4.40)
Lymphocytes Automated: 23 % (ref 15–41)
MCH: 29 pg (ref 28.0–32.0)
MCHC: 32.2 g/dL (ref 32.0–36.0)
MCV: 89.9 fL (ref 80.0–100.0)
MPV: 10.6 fL (ref 9.4–12.3)
Monocytes Absolute Automated: 0.86 10*3/uL (ref 0.00–1.20)
Monocytes: 11 % (ref 0–11)
Neutrophils Absolute: 4.63 10*3/uL (ref 1.80–8.10)
Neutrophils: 62 % (ref 52–75)
Nucleated RBC: 0 /100 WBC (ref 0–1)
Platelets: 238 10*3/uL (ref 140–400)
RBC: 3.07 10*6/uL — ABNORMAL LOW (ref 4.20–5.40)
RDW: 16 % — ABNORMAL HIGH (ref 12–15)
WBC: 7.53 10*3/uL (ref 3.50–10.80)

## 2012-03-25 LAB — BASIC METABOLIC PANEL
BUN: 17 mg/dL (ref 7–21)
CO2: 24 mEq/L (ref 22–29)
Calcium: 8.5 mg/dL (ref 7.9–10.6)
Chloride: 107 mEq/L (ref 98–107)
Creatinine: 0.9 mg/dL (ref 0.6–1.0)
Glucose: 87 mg/dL (ref 70–100)
Potassium: 4 mEq/L (ref 3.5–5.1)
Sodium: 138 mEq/L (ref 136–145)

## 2012-03-25 LAB — POCT GLUCOSE
Whole Blood Glucose POCT: 100 mg/dL (ref 70–100)
Whole Blood Glucose POCT: 77 mg/dL (ref 70–100)
Whole Blood Glucose POCT: 138 mg/dL — AB (ref 70–100)

## 2012-03-25 LAB — GFR: EGFR: >60

## 2012-03-25 MED ORDER — GUAIFENESIN ER 600 MG PO TB12
600.00 mg | ORAL_TABLET | Freq: Two times a day (BID) | ORAL | Status: AC
Start: 2012-03-25 — End: 2012-04-04

## 2012-03-25 MED ORDER — METRONIDAZOLE 500 MG PO TABS
500.00 mg | ORAL_TABLET | Freq: Three times a day (TID) | ORAL | Status: AC
Start: 2012-03-25 — End: 2012-04-01

## 2012-03-25 MED ORDER — INSULIN GLARGINE 100 UNIT/ML SC SOLN
16.00 [IU] | Freq: Every evening | SUBCUTANEOUS | Status: DC
Start: 2012-03-25 — End: 2012-05-31

## 2012-03-25 MED ORDER — LISINOPRIL 2.5 MG PO TABS
2.50 mg | ORAL_TABLET | Freq: Every day | ORAL | Status: AC
Start: 2012-03-25 — End: 2013-03-25

## 2012-03-25 MED ORDER — FUROSEMIDE 40 MG PO TABS
20.00 mg | ORAL_TABLET | Freq: Every day | ORAL | Status: AC
Start: 2012-03-25 — End: 2012-04-24

## 2012-03-25 MED ORDER — LEVOFLOXACIN 500 MG PO TABS
500.00 mg | ORAL_TABLET | Freq: Every day | ORAL | Status: AC
Start: 2012-03-25 — End: 2012-04-01

## 2012-03-25 NOTE — Discharge Summary (Signed)
DISCHARGE NOTE    Date Time: 03/25/2012 1:01 PM  Patient Name: [name]  Attending Physician: Anselmo Pickler, MD    Date of Admission:   03/19/2012    Date of Discharge:   03/25/2012    Reason for Admission:   Diverticulitis [562.11]  GI bleeding [578.9]  Acute renal insufficiency [593.9]  94106Diverticulitis194106  94106Diverticulitis94106  604540 Colitis, [id number]    Problems:   Lists the present on admission hospital problems  Present on Admission:   . Anemia  . Diverticulitis  . AKI (acute kidney injury)  . CKD (chronic kidney disease), stage III    Hospital Problems:  Principal Problem:   *AKI (acute kidney injury)  Active Problems:   Anemia   Diverticulitis   CKD (chronic kidney disease), stage III      Problem Lists:  Patient Active Problem List   Diagnosis   . Type II or unspecified type diabetes mellitus with neurological manifestations, not stated as uncontrolled(250.60)   . Atherosclerosis of native arteries of the extremities, unspecified   . Type II or unspecified type diabetes mellitus with neurological manifestations, not stated as uncontrolled(250.60)   . Chronic venous insufficiency   . CHF (congestive heart failure)   . HTN (hypertension)   . History of DVT of lower extremity   . GERD (gastroesophageal reflux disease)   . RA (rheumatoid arthritis)   . Varicose veins of lower extremities with ulcer   . Anemia   . Diverticulitis   . AKI (acute kidney injury)   . CKD (chronic kidney disease), stage III            Discharge Dx:   1) Acute Kidney Injury/ Dehydration- better after IVF. Now eating well.   2) Acute Diverticulitis/ Colitis- continue ABx. Mild. Eating, no fever. C diff. In stool negative. No more diarrhea or vomiting.  3) Cough/ Pulmonary congestion, fluid overload, atelectasis/ severe kyphosis- improving, continue PO lasix. Possibly aspirating. ST evaluation appreciated. Started on dysphagia diet, now switch to mechanically altered with thin liquids. CXR shows improvement. Start  mucinex.   4) History of coronary artery disease without intervention.   CKD stage 3   5) Left Leg Venous Stasis Ulcer- wound care nursing on board.   6) Lipidemia.   7) Type 2 diabetes, controlled, insulin dependent - lantus dose decreased due to hypoglycemia.   8) Rheumatoid arthritis on prednisone.   9) History of DVT with placement of IVC filter.   10) Degenerative joint disease.   11) Hypertension.   12) CHF.   13) GERD.   DNR/I      Consultations:   None.    Procedures performed:   None.    Hospital Course:   [name] is a 78 y.o. female who presents to the hospital with diarrhea, nausea and vomiting. Started yesterday with non-bloody, non-bilious vomiting and frequent BM's with brown stool. Denied pain. On ER evaluation it was noted she had leukocytosis, renal failure and CT A/P w/o contrast shows possible diverticulitis. She was recently on abx for a venous stasis ulcer. Denies food poisoning or sick contacts. She was started on IVF for dehydration and next day renal function was better. She started coughing, complaining of chest congestion. CXR showed moderate bilateral lower lobe airspace disease and new blunting of the costophrenic angles suggesting bilateral pleural effusions. IVF were stopped and she received lasix. Patient was tolerating food and vomiting and diarrhea resolved. She had her lantus dose decreased due to episode of hypoglycemia. Repeat  CXR showed improvement but she persisted with coughing. Speech therapy was consulted. Her swallowing is OK. She is still a little weak to go back home but improved a lot since admission. She will be discharged to SNF today. Will treat for diverticulitis for another week.          Discharge Medications:     Current Discharge Medication List      START taking these medications    Details   !! furosemide (LASIX) 40 MG tablet Take 0.5 tablets (20 mg total) by mouth daily.  Qty: 15 tablet, Refills: 2      guaiFENesin (MUCINEX) 600 MG 12 hr tablet Take 1  tablet (600 mg total) by mouth every 12 (twelve) hours.  Qty: 20 tablet, Refills: 0      levofloxacin (LEVAQUIN) 500 MG tablet Take 1 tablet (500 mg total) by mouth daily.  Qty: 7 tablet, Refills: 0      lisinopril (PRINIVIL,ZESTRIL) 2.5 MG tablet Take 1 tablet (2.5 mg total) by mouth daily.  Qty: 30 tablet, Refills: 11      metroNIDAZOLE (FLAGYL) 500 MG tablet Take 1 tablet (500 mg total) by mouth every 8 (eight) hours.  Qty: 21 tablet, Refills: 0       !! - Potential duplicate medications found. Please discuss with provider.      CONTINUE these medications which have CHANGED    Details   insulin glargine (LANTUS) 100 UNIT/ML injection Inject 16 Units into the skin nightly.  Qty: 4.8 mL, Refills: 11         CONTINUE these medications which have NOT CHANGED    Details   amLODIPine (NORVASC) 5 MG tablet Take 5 mg by mouth daily.      aspirin 81 MG tablet Take 81 mg by mouth daily.      calcium-vitamin D (OSCAL-500) 500-200 MG-UNIT per tablet Take 1 tablet by mouth daily.      cetirizine (ZYRTEC) 10 MG tablet Take 10 mg by mouth daily.      !! furosemide (LASIX) 20 MG tablet Take 20 mg by mouth daily.       gabapentin (NEURONTIN) 600 MG tablet Take 600 mg by mouth 2 (two) times daily.      glipiZIDE (GLUCOTROL) 5 MG tablet Take 5 mg by mouth 2 (two) times daily before meals.      insulin aspart (NOVOLOG) 100 UNIT/ML injection Inject into the skin 3 (three) times daily before meals. Sliding scale      magnesium oxide (MAG-OX) 400 MG tablet Take 400 mg by mouth daily.      methotrexate (RHEUMATREX) 2.5 MG tablet Take 7.5 mg by mouth every mon, wed and fri.      pantoprazole (PROTONIX) 40 MG tablet Take 40 mg by mouth daily.      predniSONE (DELTASONE) 5 MG tablet Take 5 mg by mouth daily.      sucralfate (CARAFATE) 1 G tablet Take 1 g by mouth daily.      traMADol-acetaminophen (ULTRACET) 37.5-325 MG per tablet Take 1 tablet by mouth every 6 (six) hours as needed.      vitamin B-12 (CYANOCOBALAMIN) 500 MCG tablet Take  500 mcg by mouth daily.       !! - Potential duplicate medications found. Please discuss with provider.            Discharge Instructions:   Diet: mechanically altered with thin liquids.    Follow-up with internal medicine in 2 weeks  Signed by: San Morelle, MD

## 2012-03-25 NOTE — Progress Notes (Signed)
Left lower leg wrap intact, Complain of pain ratio 6/10,Ultracet -a combo of tramadol 50mg  and tylenol 650mg  given po at 2207. Assisted with pm care, repositioned for comfort. Pain relief achieved.Will maintain comfort.    Lung sounds diminished throughout , productive cough present with congestion. Mucinex 600mg  given po as per order  Encouraged to deep breath and cough.

## 2012-03-25 NOTE — SLP Progress Note (Signed)
Speech Language Pathology    Speech and Language Treatment    Patient: [name]    MRN#: [id number]    Treatment Time: 0730 to 0830    Subjective: Patient is agreeable to participation in the therapy session. Patient's medical condition is  appropriate for Speech therapy intervention at this time.    Objective: Tx focused on swallow and memory for using swallow strategies to increase swallow safety.    Assessment: Pt is awake and alert. Swallow is improved and WFL for thin liquids, purees and soft solids. Adequate timing and laryngeal elevation noted. No cough, change in vocal quality or other s/s of aspiration observed. Mastication is slow, yet effective for soft solids. Min cues to use double swallows and liquid assists to increase clear min oral residuals and increase swallow safety. Discussed with RN.    Plan: Recommend a dysphagia mechanically altered diet with thin liquids with close supervision. Aspiration precautions.    Signature: Stefan Church, M.S. CCC-SLP

## 2012-03-25 NOTE — Progress Notes (Signed)
Pt hand-off time 7:10 am this morning, alert and awake, received all scheduled medications and tolerated well. Pt denies pain and discomfort, was assisted to get out of bed and assisted with her ADL. Isolation was discontinued this am.  Order to discharge pt is received and pt is educated on her medications,report is given to Physician transport and [facility] was called and report was given to RN Gerlene Burdock and pt is discharged to [facility].

## 2012-04-21 NOTE — Discharge Summary (Unsigned)
ADMITTED:   10/13/2000      DISCHARGED:10/16/2000            DISCHARGE DIAGNOSES:      1.    Chest pain.      2.    Malignant hypertension.      3.    Type II diabetes mellitus uncomplicated.      4.    Anemia.      5.    Costochondritis.      6.    Rheumatoid arthritis.            PROCEDURES:  None.            CONSULTANTS:  Dr. Lovina Reach.            HOSPITAL COURSE:  The patient is an 78 year old black female with a history      of hypertension, type II diabetes mellitus,  chronic  active  gastritis and      Helicobacter  pylori  infection,  diagnosed   on   upper   gastrointestinal      endoscopy November 1997 done by Dr. Dalbert Mayotte,  history  of  anemia of unknown      etiology and history of recent colonoscopy  and  barium  enema done on October 07, 2000, which revealed sigmoid diverticulosis.  The patient complained of      some severe chest pressure accompanied  by shortness of breath at least one      week  prior to admission.  The shortness  of  breath  was  not  related  to      exertion.   The  patient  did  admit  to  a  recent  respiratory  infection      approximately three weeks prior to this  admission  which  was treated with      oral antibiotics by Dr. Tristan Schroeder.   There was severe coughing at that      time.  The chest pressure started traveling  to her left arm earlier on the      morning of admission.  She presented  to  [facility] with these      symptoms.  Oxygen saturation on room  air  was initially 95%. She was begun      on intravenous nitroglycerine as well  as intravenous heparin and the chest      pressure seemed to subside.  The initial  chest  x-ray  revealed  a widened      mediastinum  and  a widened aortic notch.   In  view  of  this  a  computed      tomography scan of the chest was done and was negative for aortic aneurysm.      The ventilation/perfusion scan was also done which showed a low probability      for pulmonary embolism.  She was seen  by Dr. Lovina Reach in  the emergency room      and was begun on intravenous heparin for  unstable angina. She was admitted      directly  to  the  intermediate  care   unit  for  further  evaluation  and      treatment.            The initial creatine phosphokinase and  other cardiac enzymes were negative      for  acute myocardial infarction.   On  day  three  of  her  hospital  stay      intravenous heparin and intravenous nitroglycerin  were  discontinued,  and      the patient remained chest pain free.   An adenosine dual isotope study was      done on October 15, 2000, which was negative for ischemia.            In view of this the patient was considered  stable  enough for discharge on      October 16, 2000,  on the following medications,  Captopril  25  mg  b.i.d.,      Allegra 60 mg daily, folic acid 1 mg daily,  Glyburide 10 mg in the morning      and 5 mg at bedtime.  She was to followup  with Dr. Chestine Spore within two weeks'      time and special arrangements were made  for  home  care  services for home      prothrombin  time, occupational therapy,  safety  evaluation,  as  well  as      Meals-On-Wheels through the social service department.                  ___________________________________      Maeola Sarah, MD            D 12/06/2000  7:34 A; T 12/06/2000 12:02  A;  908  -  - [id number], [id number],      #[id number]      CC:   Alease Medina, MD            Maeola Sarah, MD

## 2012-04-21 NOTE — Op Note (Unsigned)
ADMITTED:            12/09/2001      PROCEDURE DATE: 12/09/2001      ROOM NUMBER:         [id number]            PREOPERATIVE DIAGNOSIS:  Cataract, left eye.            POSTOPERATIVE DIAGNOSIS:  Cataract, left eye.            LENS:  Alcon SA60AT, +23.0 diopters.            ANESTHESIA:  Topical.            COMPLICATIONS:  None.            DESCRIPTION OF THE PROCEDURE:  After appropriate informed consent was      obtained, the patient had topical anesthesia with 4% nonpreserved lidocaine      q.10 minutes times two to the operated eye.            The patient was then prepped and draped in the usual sterile fashion for      ocular surgery including a Betadine drop instilled topically and wrapping      of the upper and lower lids and lashes behind the drape.  A speculum was      placed between the lids of the operated eye and the operating microscope      was brought into position.  A conjunctival peritomy of 3.0 mm was made at      the 12 o'clock limbus and adequate hemostasis was obtained with bipolar      wet-field cautery.  A corneoscleral groove of 3.0 mm was made at the      12 o'clock limbus 2 mm posterior to the limbus with a 69 blade.      Dissection was carried out with an EdgeAhead blade to the level of clear      cornea and paracentesis was made at the 2 o'clock limbus.  0.5 cc of 1%      nonpreserved lidocaine was instilled into the anterior chamber.  Amvisc      Plus was then placed into the anterior chamber and the anterior chamber was      then re-entered through the original incision through clear cornea with a      2.75-mm blade.  A bent 25-gauge needle was used to raise a capsular flap      and a continuous-tear capsulotomy was made with capsulorrhexis forceps.      Hydrodissection was carried out with balanced salt solution and the nucleus      was then phacoemulsified within the capsular bag using a divide-and-conquer      technique with the Legacy 20,000 handpiece and Koch spatula.  The nucleus       was divided into four quadrants, each of which were phacoemulsified within      the capsular bag.  The irrigation/aspiration was then used to remove      residual cortical material.  An inspection of the capsular bag was noted      and it was intact with no evidence of residual cortical material.  Amvisc      Plus was used to inflate the capsular bag.  The intraocular lens was then      placed through an injector within the capsular bag with the trailing haptic      placed into the capsular bag with the use of  a Lester hook at the      optic/haptic junction.  The intraocular lens was centered with the [facility]      hook and inspected and found to be centered well within the capsular bag.      The irrigation/aspiration was then used to remove residual Amvisc Plus and      Miochol was placed into the anterior chamber.  The incision was then      inspected carefully in all directions and found to be watertight.  The      conjunctiva was draped across the incision and secured with cautery. Two      drops of Voltaren, Vigamox, and Econopred were then instilled into the      operated eye.  The patient was brought to the recovery room in satisfactory      condition.                  ___________________________________       ______________________      Kathrin Greathouse, MD                            Date            D 12/09/2001 11:27 A; T 12/09/2001  4:40 P; 1359 - - [id number], [id number],      #[id number]      CC:  Kathrin Greathouse, MD

## 2012-04-22 NOTE — Op Note (Unsigned)
SURGEON:             Tamera Reason, MD      ADMITTED:            02/02/2003      PROCEDURE DATE: 02/02/2003      ROOM NUMBER:         4A  [id number] 02            PREOPERATIVE DIAGNOSES      1.   Severe right hip pain.      2.   Osteoarthritis of the right hip.            POSTOPERATIVE DIAGNOSES      1.   Severe right hip pain.      2.   Osteoarthritis of the right hip.            PROCEDURE(S) PERFORMED:  Total hip arthroplasty via the anterior approach      "M.I.S."            ANESTHESIA:  General endotracheal.            ESTIMATED BLOOD LOSS:  300 mL.            SPECIMEN:  Femoral head.            IMPLANT      1.   DePuy 56-mm cup x 32-mm.      2.   Duraloc 15-mm AML stem.      3.   Standard plus-5 neck.            CONDITION:  Stable.            COMPLICATIONS:  No known complications.            INTRODUCTION:  [name] is a very pleasant 78 year old female who was      seen in the office, referred by her primary care doctor, for severe hip      pain.  Radiographs showed severe end-stage osteoarthritis of the hip.  She      had extremely limited motion.            Operative and nonoperative treatments were discussed with her.      Conservative treatment was stressed and emphasized.  However, the patient      felt as if she had done significant conservative treatment and she was      miserable in pain, and was very interested in the surgical options.            These were discussed in-depth with her.  The patient understood that total      hip surgery is major surgery with high number of complications, including      death.  There are concerns about pulmonary embolus, deep venous thrombosis,      pneumonia, heart, liver, or kidney problems.  The anesthesia has      significant risks of morbidity and mortality.  There are concerns about      fracture, infection, dislocation, the need for repeat surgery, and the      like.  Despite this, [name] states that she really is miserable and has      to do something and  is very interested in proceeding with total hip      arthroplasty.            Based on her decision, she was sent for medical clearance and when      obtained, the case was  scheduled.            DESCRIPTION OF PROCEDURE:  The patient was brought to the operating room on      02/02/2003.  She was placed under anesthesia and positioned in the lateral      decubitus position.  The extremity was prepped and draped in the usual      sterile fashion.  Attempt was made to do a minimally invasive surgical      incision.            The anterolateral approach was utilized.  A small incision was made over      the flare of the greater trochanter.  Using sharp and blunt dissection, the      tensor fascia lata was identified and split in the direction of its fibers.      The Charnley retractor was then placed.            The anterior two-third of the gluteus minimus was lifted off the bone      subperiosteally and tagged with heavy Ethibond suture for later repair.      Following this, the capsule was identified and reflected.  A significant      portion of the capsule was saved by tagging it and reflecting it out of the      way.            This gave access of the femoral head, which was quite deformed.  Using an      oscillating saw, an osteotomy of the neck was performed and the femoral      head was removed and measured on the back table.  The measuring ball was      used to test the size of the acetabulum.  There was evidence of severe      osteoarthritis grossly on the specimen, about the acetabulum and the      femoral head.            Following this, the acetabulum was prepared with successive reamers.  Once      prepared, a trial cup was placed in excellent position.            Attention was then directed towards the femoral shaft.  The femur was      prepared and the hip was reduced and taken through a range of motion.      Excellent motion was achieved.  Radiograph was obtained to check the      position.             Following this, the components were removed.  Copious irrigation and good      hemostasis was achieved.            Following this, the actual components were placed.  The hip was taken      through a range of motion and found to be quite stable.  It is expected      that the patient will do extremely well based on the stability of the hip      with this anterior approach.            Following this, an extensive soft tissue repair was performed.  This      included repairing the capsule.  The gluteus medius was reattached down to      bone through drill holes.  The tensor fascia lata was reapproximated.      Hemovac drains were placed.  Subcutaneous tissue was closed in 2 layers.      Staples were used on the skin edges.            The patient was transferred to the recovery room in stable condition.      There were no known complications.                              ___________________________________     Date Signed: _______________      Tamera Reason, MD                  D 03/30/2003  4:51 P; T 03/31/2003 10:55 A; [id number] - - , [id number], #[id number]      CC:  Tamera Reason, MD

## 2012-05-01 NOTE — Discharge Summary (Unsigned)
ATTENDING MD:  Tamera Reason, MD      ADMITTED:      02/02/2003      DISCHARGED:    02/08/2003            ADMITTING DIAGNOSIS:  Severe end-stage osteoarthritis of the right hip.            COMORBIDITIES      1.   Coronary artery disease.      2.   Diabetes.      3.   Esophagus reflux.      4.   Questionable old myocardial infarction.            PRINCIPAL PROCEDURE:  Right total hip arthroplasty on February 02, 2003.            INTRODUCTION:  [name] is a very pleasant, 79 year old female who      was having extreme difficulty ambulating due to severe osteoarthritis of      the right hip.  She was managed conservatively by her primary care doctor,      Dr. Waunita Schooner, until she had sufficient pain she could no longer      ambulate.  She was evaluated by Dr. Mare Ferrari in his office.  The patient was      found to have end-stage osteoarthritis and was eager to proceed with total      hip arthroplasty.  The arrangements were made and the patient was admitted      to the hospital on February 02, 2003.  The risks, benefits, options,      complications, and anticipated outcome of rehabilitation had been all      discussed with the patient.  She was eager to proceed with total hip      arthroplasty.            The patient was brought to the operating room on February 02, 2003, where she      underwent an uncomplicated hip replacement.  She is to return to the      orthopedic floor in stable condition.            The patient did well over the next several days.  On February 07, 2003, she      was ready for discharge and the arrangements were completed such that the      following morning she could be discharged with followup arrangement with      Dr. Mare Ferrari and Dr. Ophelia Charter office.  She had an uncomplicated course and did      very well.            ___________________________________     Date Signed: _______________      Tamera Reason, MD            D; 05/27/2003  5:39 P; T 05/30/2003  1:56 P; [id number] - - , [id number], #[id number]       CC:  Tamera Reason, MD

## 2012-05-01 NOTE — Discharge Summary (Unsigned)
ATTENDING MD:  Tamera Reason, MD      ADMITTED:      02/02/2003      DISCHARGED:    02/08/2003            ADMITTING DIAGNOSIS:  Severe osteoarthritis of the hip.            CO-MORBIDITIES      1.   Atherosclerotic heart disease.      2.   Diabetes.      3.   Esophageal reflux.      4.   Old myocardial infarction.            PRINCIPAL PROCEDURE:  Total hip arthroplasty on February 02, 2003.            INTRODUCTION:  [name] is a very pleasant 79 year old female with      severe osteoarthritis of the right hip.  She had difficulty ambulating.      She had failed conservative management.  She agreed to total hip      arthroplasty.            HOSPITAL COURSE:  Once medical clearance had been obtained as an      outpatient, the patient was admitted on February 02, 2003, when she was      brought to the operating room and underwent an uncomplicated total hip      arthroplasty.  She did well in the perioperative period.            DISPOSITION:  On February 08, 2003, she was ready for discharge and      arrangements were made.  She is going to see her primary care doctor in      followup.  She is going to see Dr. Mare Ferrari in the office.  There were no      known complications.                              ___________________________________     Date Signed: _______________      Tamera Reason, MD                  D 05/04/2003  6:10 P; T 05/05/2003  5:33 P; 8295 - - , [id number], #[id number]      CC:  Tamera Reason, MD

## 2012-05-02 NOTE — Discharge Summary (Unsigned)
ATTENDING MD:  Alease Medina, MD      ADMITTED:      01/15/2004      DISCHARGED:    01/20/2005            DISCHARGE DIAGNOSES:  Fractured neck of femur, rheumatoid arthritis,      anemia, hypertension, diabetes, status post partial hip replacement Dr.      Mare Ferrari, and insertion of an indwelling catheter.            HISTORY OF PRESENT ILLNESS:  The patient is an 79 year old African-American      female well known to the practice because of her history of rheumatoid      disease.  The patient has recently been treated with Remicade with      significant success and a significant decrease in the activity of her      disease.  The patient was admitted following a questionable syncopal      episode or near syncopal episode, which occurred while she was watering her      plants.  She apparently fell backward with unclear loss of consciousness      and fractured her left hip.  She was brought to the hospital and her      physical condition was that noted in the examination of January 15, 2004      performed by Dr. Wilhelmina Mcardle.            HOSPITAL COURSE:  The patient was appropriately prepped for the operating      room and tolerated the procedure well.  She did require steroid coverage      because past use of chronic use of steroid medication, however, she      tolerated well.  Her hematocrit did not drop below the level of 32,      however, she did require 1 unit of packed red blood cells.  The patient had      a fairly unremarkable postoperative course.  Dr. Wilhelmina Mcardle, again, offered to      cover this patient at the [facility] under his care      prior to eventual return to my care.            CONDITION ON DISCHARGE:  The patient was discharged in improved condition.                                          ___________________________________     Date Signed: _______________      Alease Medina, MD                  D 07/09/2004  2:32 P; T 07/11/2004  9:16 A; 1610 - - , [id number], #[id number]      CC:   Alease Medina, MD

## 2012-05-02 NOTE — Op Note (Unsigned)
SURGEON:             Tamera Reason, MD      ADMITTED:            01/15/2004      PROCEDURE DATE: 01/19/2004      ROOM NUMBER:         4A  412 02            PREOPERATIVE DIAGNOSIS:  Left femoral neck fracture, displaced.            POSTOPERATIVE DIAGNOSIS:  Left femoral neck fracture, displaced.            PROCEDURE PERFORMED      1.   Bipolar arthroplasty.      2.   Minimally invasive surgical incision.            ASSISTANT:  Gunnar Fusi.            ANESTHESIA:  General endotracheal.            IMPLANT/DEVICE:  AML.            DRAINS:  Hemovac x2 and Foley.            COMPLICATIONS:  There were no known complications.            INDICATIONS:  [name] is a very pleasant 79 year old female who fell      and sustained a femoral neck fracture of her left hip.  She is medically      cleared and arrangements were made to bring her to the operating room on      January 19, 2004.  The risks, benefits, options, complications,      anticipated outcome, and rehabilitation were all extensively reviewed with      the patient.  She understood these concerns and consented to the surgical      procedure.  Emphasized were the risks of fracture, infection, dislocation,      intraoperative or perioperative complications including infection, the need      for further surgery if arthritis of the acetabulum develops and so on.  The      patient consented to the surgery.            DESCRIPTION OF PROCEDURE:  The patient was brought to the operating room on      January 19, 2004, where she was placed under anesthesia.  The extremity      was prepped and draped in the usual sterile fashion with the patient in a      lateral decubitus position.            A linear incision was made over the flare of the greater trochanter.  Sharp      and blunt dissection was utilized and the tensor fascia lata was identified      and split in the direction of its fibers.  At this point, the anterior      two-thirds of the gluteus medius were reflected  and tagged with a heavy      Ethibond suture.  The capsule was then identified.  This was split and both      limbs of the capsule were reflected.  This gave good access to the fracture      and the hematoma was evacuated.  At this point, the femoral head was      dislocated from the acetabulum and measured on the back table.  This  allowed [place] to proceed with preparation of the femoral shaft.  The femoral      shaft was prepared with a sequence of reamers and rasps.  Once it was      prepared, the trial components were placed, and a trial reduction was      performed.  The hip was found to go through an excellent stable range of      motion.  Following this, the components were removed and the actual      components were placed.  The hip was found to be stable.  A meticulous      repair of the soft tissues was performed as well as a sterile dressing.      The patient was transferred to the PACU in stable condition.  There were no      known complications.                                          ___________________________________     Date Signed: _______________      Tamera Reason, MD                  D 02/24/2004  6:20 P; T 02/25/2004  3:36 P; 9528 - - , [id number], #[id number]      CC:  Tamera Reason, MD

## 2012-05-31 ENCOUNTER — Inpatient Hospital Stay: Payer: Medicare Other | Admitting: Internal Medicine

## 2012-05-31 ENCOUNTER — Emergency Department: Payer: Medicare Other

## 2012-05-31 ENCOUNTER — Inpatient Hospital Stay
Admission: EM | Admit: 2012-05-31 | Discharge: 2012-06-01 | DRG: 552 | Disposition: A | Discharge status: Home / Self Care | Payer: Medicare Other | Attending: Internal Medicine | Admitting: Internal Medicine

## 2012-05-31 DIAGNOSIS — R42 Dizziness and giddiness: Secondary | ICD-10-CM | POA: Diagnosis present

## 2012-05-31 DIAGNOSIS — Z794 Long term (current) use of insulin: Secondary | ICD-10-CM

## 2012-05-31 DIAGNOSIS — M1990 Unspecified osteoarthritis, unspecified site: Secondary | ICD-10-CM | POA: Diagnosis present

## 2012-05-31 DIAGNOSIS — IMO0002 Reserved for concepts with insufficient information to code with codable children: Secondary | ICD-10-CM

## 2012-05-31 DIAGNOSIS — E559 Vitamin D deficiency, unspecified: Secondary | ICD-10-CM | POA: Diagnosis present

## 2012-05-31 DIAGNOSIS — K219 Gastro-esophageal reflux disease without esophagitis: Secondary | ICD-10-CM | POA: Diagnosis present

## 2012-05-31 DIAGNOSIS — Z91041 Radiographic dye allergy status: Secondary | ICD-10-CM

## 2012-05-31 DIAGNOSIS — I10 Essential (primary) hypertension: Secondary | ICD-10-CM | POA: Diagnosis present

## 2012-05-31 DIAGNOSIS — R748 Abnormal levels of other serum enzymes: Secondary | ICD-10-CM | POA: Diagnosis present

## 2012-05-31 DIAGNOSIS — M542 Cervicalgia: Principal | ICD-10-CM | POA: Diagnosis present

## 2012-05-31 DIAGNOSIS — R5381 Other malaise: Secondary | ICD-10-CM | POA: Diagnosis present

## 2012-05-31 DIAGNOSIS — Z7982 Long term (current) use of aspirin: Secondary | ICD-10-CM

## 2012-05-31 DIAGNOSIS — M069 Rheumatoid arthritis, unspecified: Secondary | ICD-10-CM | POA: Diagnosis present

## 2012-05-31 DIAGNOSIS — R5383 Other fatigue: Secondary | ICD-10-CM | POA: Diagnosis present

## 2012-05-31 DIAGNOSIS — E119 Type 2 diabetes mellitus without complications: Secondary | ICD-10-CM | POA: Diagnosis present

## 2012-05-31 DIAGNOSIS — J309 Allergic rhinitis, unspecified: Secondary | ICD-10-CM | POA: Diagnosis present

## 2012-05-31 LAB — COMPREHENSIVE METABOLIC PANEL
ALT: 55 U/L (ref 0–55)
AST (SGOT): 58 U/L — ABNORMAL HIGH (ref 5–34)
Albumin/Globulin Ratio: 0.6 — ABNORMAL LOW (ref 0.9–2.2)
Albumin: 2.9 g/dL — ABNORMAL LOW (ref 3.5–5.0)
Alkaline Phosphatase: 317 U/L — ABNORMAL HIGH (ref 40–150)
BUN: 25 mg/dL — ABNORMAL HIGH (ref 7–21)
Bilirubin, Total: 0.5 mg/dL (ref 0.2–1.2)
CO2: 21 mEq/L — ABNORMAL LOW (ref 22–29)
Calcium: 9 mg/dL (ref 7.9–10.6)
Chloride: 107 mEq/L (ref 98–107)
Creatinine: 1 mg/dL (ref 0.6–1.0)
Globulin: 4.7 g/dL — ABNORMAL HIGH (ref 2.0–3.6)
Glucose: 202 mg/dL — ABNORMAL HIGH (ref 70–100)
Potassium: 4.4 mEq/L (ref 3.5–5.1)
Protein, Total: 7.6 g/dL (ref 6.0–8.3)
Sodium: 140 mEq/L (ref 136–145)

## 2012-05-31 LAB — CBC AND DIFFERENTIAL
Basophils Absolute Automated: 0.02 10*3/uL (ref 0.00–0.20)
Basophils Automated: 0 %
Eosinophils Absolute Automated: 0.18 10*3/uL (ref 0.00–0.70)
Eosinophils Automated: 2 %
Hematocrit: 31.7 % — ABNORMAL LOW (ref 37.0–47.0)
Hgb: 10 g/dL — ABNORMAL LOW (ref 12.0–16.0)
Immature Granulocytes Absolute: 0.03 10*3/uL
Immature Granulocytes: 0 %
Lymphocytes Absolute Automated: 1.68 10*3/uL (ref 0.50–4.40)
Lymphocytes Automated: 17 %
MCH: 30.7 pg (ref 28.0–32.0)
MCHC: 31.5 g/dL — ABNORMAL LOW (ref 32.0–36.0)
MCV: 97.2 fL (ref 80.0–100.0)
MPV: 11.7 fL (ref 9.4–12.3)
Monocytes Absolute Automated: 0.82 10*3/uL (ref 0.00–1.20)
Monocytes: 8 %
Neutrophils Absolute: 7.19 10*3/uL (ref 1.80–8.10)
Neutrophils: 73 %
Nucleated RBC: 0 /100 WBC (ref 0–1)
Platelets: 211 10*3/uL (ref 140–400)
RBC: 3.26 10*6/uL — ABNORMAL LOW (ref 4.20–5.40)
RDW: 18 % — ABNORMAL HIGH (ref 12–15)
WBC: 9.89 10*3/uL (ref 3.50–10.80)

## 2012-05-31 LAB — PT AND APTT
PT INR: 1 (ref 0.9–1.1)
PT: 13 s (ref 12.6–15.0)
PTT: 25 s (ref 23–37)

## 2012-05-31 LAB — URINALYSIS, REFLEX TO MICROSCOPIC EXAM IF INDICATED
Bilirubin, UA: NEGATIVE
Blood, UA: NEGATIVE
Glucose, UA: NEGATIVE
Ketones UA: NEGATIVE
Leukocyte Esterase, UA: NEGATIVE
Nitrite, UA: NEGATIVE
Protein, UR: NEGATIVE
Specific Gravity UA: 1.008 (ref 1.001–1.035)
Urine pH: 6 (ref 5.0–8.0)
Urobilinogen, UA: NORMAL mg/dL

## 2012-05-31 LAB — TROPONIN I: Troponin I: 0.12 ng/mL — ABNORMAL HIGH (ref 0.00–0.09)

## 2012-05-31 LAB — GFR: EGFR: >60

## 2012-05-31 MED ORDER — NITROGLYCERIN 2 % TD OINT
1.0000 [in_us] | TOPICAL_OINTMENT | Freq: Once | TRANSDERMAL | Status: AC
Start: 2012-05-31 — End: 2012-05-31
  Administered 2012-05-31: 1 [in_us] via TOPICAL
  Filled 2012-05-31: qty 1

## 2012-05-31 MED ORDER — GLUCAGON HCL (RDNA) 1 MG IJ SOLR
1.0000 mg | INTRAMUSCULAR | Status: DC | PRN
Start: 2012-05-31 — End: 2012-06-01

## 2012-05-31 MED ORDER — DEXTROSE 50 % IV SOLN
25.0000 mL | INTRAVENOUS | Status: DC | PRN
Start: 2012-05-31 — End: 2012-06-01

## 2012-05-31 MED ORDER — ASPIRIN 325 MG PO TABS
325.0000 mg | ORAL_TABLET | Freq: Once | ORAL | Status: AC
Start: 2012-05-31 — End: 2012-05-31
  Administered 2012-05-31: 325 mg via ORAL
  Filled 2012-05-31: qty 1

## 2012-05-31 MED ORDER — ACETAMINOPHEN 325 MG PO TABS
650.0000 mg | ORAL_TABLET | Freq: Four times a day (QID) | ORAL | Status: AC | PRN
Start: 2012-05-31 — End: 2012-05-31
  Administered 2012-05-31: 650 mg via ORAL
  Filled 2012-05-31: qty 2

## 2012-05-31 MED ORDER — GLUCOSE 40 % PO GEL
15.00 g | ORAL | Status: DC | PRN
Start: 2012-05-31 — End: 2012-06-01

## 2012-05-31 MED ORDER — ACETAMINOPHEN 325 MG PO TABS
975.0000 mg | ORAL_TABLET | Freq: Once | ORAL | Status: AC
Start: 2012-05-31 — End: 2012-05-31
  Administered 2012-05-31: 975 mg via ORAL
  Filled 2012-05-31: qty 3

## 2012-05-31 MED ORDER — ACETAMINOPHEN 325 MG PO TABS
ORAL_TABLET | ORAL | Status: AC
Start: 2012-05-31 — End: 2012-06-01
  Filled 2012-05-31: qty 1

## 2012-05-31 MED ORDER — INSULIN ASPART 100 UNIT/ML SC SOLN
1.0000 [IU] | SUBCUTANEOUS | Status: DC | PRN
Start: 2012-05-31 — End: 2012-06-01
  Administered 2012-05-31 – 2012-06-01 (×2): 1 [IU] via SUBCUTANEOUS
  Filled 2012-05-31 (×2): qty 10

## 2012-05-31 NOTE — ED Provider Notes (Signed)
Physician/Midlevel provider first contact with patient: 05/31/12 1800         History     Chief Complaint   Patient presents with   . Neck Pain   . Abdominal Pain   . Dizziness     Patient is a 79 y.o. female presenting with neurologic complaint. The history is provided by the patient. No language interpreter was used.   Neurologic Problem  The primary symptoms include headaches and dizziness. The symptoms began 12 to 24 hours ago. The symptoms are unchanged. The neurological symptoms are diffuse.   The headache is associated with neck stiffness. The headache is not associated with photophobia or weakness.     Dizziness does not occur with tinnitus or weakness.   Additional symptoms include neck stiffness, pain and vertigo. Additional symptoms do not include weakness, lower back pain, leg pain, photophobia, hearing loss or tinnitus.     Pt lives alone.  Brought in by friend due to pain in neck and dizziness since yesterday.  Given how long sx have been going on tpa not a consideration.  Pt states she did not fall.  Has chest pain and shortness of breath that is unchanged and has seen dr. Franchot Erichsen recently.    Past Medical History   Diagnosis Date   . Diabetes mellitus without complication    . Hypertensive disorder    . Arthritis      rheumatoid       History reviewed. No pertinent past surgical history.    No family history on file.    Social  History   Substance Use Topics   . Smoking status: Never Smoker    . Smokeless tobacco: Not on file   . Alcohol Use: No       .     Allergies   Allergen Reactions   . Azithromycin    . Dilaudid (Hydromorphone Hcl)    . Erythromycin    . Iodine      Iv contrast       Current/Home Medications    AMLODIPINE (NORVASC) 5 MG TABLET    Take 5 mg by mouth daily.    ASPIRIN 81 MG TABLET    Take 81 mg by mouth daily.    CALCIUM-VITAMIN D (OSCAL-500) 500-200 MG-UNIT PER TABLET    Take 1 tablet by mouth daily.    CETIRIZINE (ZYRTEC) 10 MG TABLET    Take 10 mg by mouth daily.     FUROSEMIDE (LASIX) 20 MG TABLET    Take 20 mg by mouth daily.     GABAPENTIN (NEURONTIN) 600 MG TABLET    Take 600 mg by mouth 2 (two) times daily.    GLIPIZIDE (GLUCOTROL) 5 MG TABLET    Take 5 mg by mouth 2 (two) times daily before meals.    INSULIN ASPART (NOVOLOG) 100 UNIT/ML INJECTION    Inject into the skin 3 (three) times daily before meals. Sliding scale    LISINOPRIL (PRINIVIL,ZESTRIL) 2.5 MG TABLET    Take 1 tablet (2.5 mg total) by mouth daily.    MAGNESIUM OXIDE (MAG-OX) 400 MG TABLET    Take 400 mg by mouth daily.    METHOTREXATE (RHEUMATREX) 2.5 MG TABLET    Take 7.5 mg by mouth every mon, wed and fri.    PANTOPRAZOLE (PROTONIX) 40 MG TABLET    Take 40 mg by mouth daily.    PREDNISONE (DELTASONE) 5 MG TABLET    Take 5 mg by mouth daily.  SUCRALFATE (CARAFATE) 1 G TABLET    Take 1 g by mouth daily.    TRAMADOL-ACETAMINOPHEN (ULTRACET) 37.5-325 MG PER TABLET    Take 1 tablet by mouth every 6 (six) hours as needed.    VITAMIN B-12 (CYANOCOBALAMIN) 500 MCG TABLET    Take 500 mcg by mouth daily.        Review of Systems   HENT: Positive for neck stiffness. Negative for hearing loss and tinnitus.    Eyes: Negative for photophobia.   Neurological: Positive for dizziness, vertigo, light-headedness and headaches. Negative for syncope and weakness.   All other systems reviewed and are negative.        Physical Exam    BP 183/79  Pulse 78  Temp 98.1 F (36.7 C)  Resp 20  SpO2 97%    Physical Exam  Nursing note and vitals reviewed.  Constitutional:  Well developed, well nourished. Awake & Oriented x3.  Head:  Atraumatic. Normocephalic.    Eyes:  PERRL. EOMI. Conjunctivae are not pale.  ENT:  Mucous membranes are moist and intact. Oropharynx is clear and symmetric.  Patent airway.  Neck:  Supple. Full ROM.  Tender mostly right paracervical.  Cardiovascular:  Regular rate. Regular rhythm. No murmurs, rubs, or gallops.  Pulmonary/Chest:  No evidence of respiratory distress. Clear to auscultation bilaterally.   No wheezing, rales or rhonchi.   Abdominal:  Soft and non-distended. There is no tenderness. No rebound, guarding, or rigidity.  Back:  Full ROM. Nontender.  Extremities:  No edema. No cyanosis. No clubbing. Full range of motion in all extremities.  Skin:  Skin is warm and dry.  No diaphoresis. No rash.   Neurological:  Alert, awake, and appropriate. Normal speech. Motor normal.  Psychiatric:  Good eye contact. Normal interaction, affect, and behavior.    Pox 99 ra normal.  MDM and ED Course     ED Medication Orders      Start     Status Ordering Provider    05/31/12 1945   aspirin tablet 325 mg   Once      Route: Oral  Ordered Dose: 325 mg         Last MAR action:  Given Floyce Bujak C    05/31/12 1945   acetaminophen (TYLENOL) tablet 975 mg   Once in ED      Route: Oral  Ordered Dose: 975 mg         Last MAR action:  Given Acie Custis C    05/31/12 1945   nitroglycerin (NITRO-BID) 2 % ointment 1 inch   Once      Route: Topical  Ordered Dose: 1 inch         Last MAR action:  Given Sache Sane C    05/31/12 1939   acetaminophen (TYLENOL) 325 MG tablet      Comments: Created by cabinet override        Last MAR action:  Not Given Soul Deveney C                 MDM  Ekg:  nsr 75.  Consider ant mi due to stt elevation ? Age.  Normal axis.  Monitor nsr 75.    Dw. Dr. Kevin Fenton who will admit.  We both agree aggressive measures like heart cath would not make sense at her age.  dw dr. Donley Redder cardiology who will consult  Procedures    Clinical Impression & Disposition     Clinical Impression  Final  diagnoses:   Neck pain   Dizziness   Elevated troponin   ACS (acute coronary syndrome)        ED Disposition     Admit Admitting Physician: Susann Givens A [[id number]]  Diagnosis: Dizziness [604540]  Estimated Length of Stay: 3 - 5 Days  Tentative Discharge Plan?: Skilled Nursing Facility [3]  Patient Class: Inpatient [101]  I certify that inpatient services for greater than two midnights are medically necessary for this  patient. Please see H&P and MD Progress Notes for additional information about the patient's course of treatment.: Yes             New Prescriptions    No medications on file               Kennith Maes, MD  05/31/12 2024

## 2012-05-31 NOTE — ED Notes (Signed)
Pt reports dizziness, weakness, Abd/neck pain today. Pt AOX3 at this time.

## 2012-06-01 LAB — ECG 12-LEAD
Atrial Rate: 75 {beats}/min
P Axis: 40 degrees
P-R Interval: 158 ms
Q-T Interval: 388 ms
QRS Duration: 88 ms
QTC Calculation (Bezet): 433 ms
R Axis: -17 degrees
T Axis: 13 degrees
Ventricular Rate: 75 {beats}/min

## 2012-06-01 LAB — LIPID PANEL
Cholesterol / HDL Ratio: 9.8 Index
Cholesterol: 157 mg/dL (ref 0–199)
HDL: 16 mg/dL — ABNORMAL LOW (ref >40)
LDL Calculated: 100 mg/dL — AB (ref 0–99)
Triglycerides: 203 mg/dL — ABNORMAL HIGH (ref 34–149)
VLDL Calculated: 41 mg/dL — ABNORMAL HIGH (ref 10–40)

## 2012-06-01 LAB — TROPONIN I
Troponin I: 0.1 ng/mL — ABNORMAL HIGH (ref 0.00–0.09)
Troponin I: 0.09 ng/mL (ref 0.00–0.09)

## 2012-06-01 LAB — HEMOGLOBIN A1C: Hemoglobin A1C: 8 % — ABNORMAL HIGH (ref 0.0–6.0)

## 2012-06-01 LAB — HEMOLYSIS INDEX: Hemolysis Index: 40 Index — ABNORMAL HIGH (ref 0–9)

## 2012-06-01 LAB — POCT GLUCOSE
Whole Blood Glucose POCT: 78 mg/dL (ref 70–100)
Whole Blood Glucose POCT: 178 mg/dL — AB (ref 70–100)

## 2012-06-01 MED ORDER — ACETAMINOPHEN 500 MG PO TABS
1000.0000 mg | ORAL_TABLET | Freq: Four times a day (QID) | ORAL | Status: DC | PRN
Start: 2012-06-01 — End: 2012-06-01

## 2012-06-01 MED ORDER — INSULIN ASPART 100 UNIT/ML SC SOLN
3.0000 [IU] | Freq: Three times a day (TID) | SUBCUTANEOUS | Status: DC
Start: 2012-06-01 — End: 2012-06-01
  Administered 2012-06-01: 3 [IU] via SUBCUTANEOUS
  Filled 2012-06-01: qty 30

## 2012-06-01 MED ORDER — AMLODIPINE BESYLATE 5 MG PO TABS
5.00 mg | ORAL_TABLET | Freq: Every day | ORAL | Status: DC
Start: 2012-06-01 — End: 2012-06-01
  Administered 2012-06-01: 5 mg via ORAL
  Filled 2012-06-01: qty 1

## 2012-06-01 MED ORDER — LISINOPRIL 5 MG PO TABS
2.50 mg | ORAL_TABLET | Freq: Every day | ORAL | Status: DC
Start: 2012-06-01 — End: 2012-06-01
  Administered 2012-06-01: 2.5 mg via ORAL
  Filled 2012-06-01: qty 1

## 2012-06-01 MED ORDER — INSULIN GLARGINE 100 UNIT/ML SC SOLN
15.00 [IU] | Freq: Every evening | SUBCUTANEOUS | Status: DC
Start: 2012-06-01 — End: 2012-06-01

## 2012-06-01 MED ORDER — HYDRALAZINE HCL 20 MG/ML IJ SOLN
10.0000 mg | Freq: Four times a day (QID) | INTRAMUSCULAR | Status: DC | PRN
Start: 2012-06-01 — End: 2012-06-01
  Administered 2012-06-01: 10 mg via INTRAVENOUS
  Filled 2012-06-01: qty 1

## 2012-06-01 MED ORDER — ACETAMINOPHEN 325 MG PO TABS
ORAL_TABLET | Freq: Four times a day (QID) | ORAL | Status: DC | PRN
Start: 2012-06-01 — End: 2012-06-01
  Filled 2012-06-01 (×2): qty 1

## 2012-06-01 MED ORDER — GLIPIZIDE 5 MG PO TABS
5.0000 mg | ORAL_TABLET | Freq: Two times a day (BID) | ORAL | Status: DC
Start: 2012-06-01 — End: 2012-06-01

## 2012-06-01 MED ORDER — PREDNISONE 5 MG PO TABS
5.0000 mg | ORAL_TABLET | Freq: Every day | ORAL | Status: DC
Start: 2012-06-01 — End: 2012-06-01
  Administered 2012-06-01: 5 mg via ORAL
  Filled 2012-06-01: qty 1

## 2012-06-01 MED ORDER — INSULIN GLARGINE 100 UNIT/ML SC SOLN
16.0000 [IU] | Freq: Every evening | SUBCUTANEOUS | Status: DC
Start: 2012-06-01 — End: 2012-06-01

## 2012-06-01 MED ORDER — FUROSEMIDE 20 MG PO TABS
20.00 mg | ORAL_TABLET | Freq: Every day | ORAL | Status: DC
Start: 2012-06-01 — End: 2012-06-01
  Administered 2012-06-01: 20 mg via ORAL
  Filled 2012-06-01: qty 1

## 2012-06-01 MED ORDER — CETIRIZINE HCL 10 MG PO TABS
10.0000 mg | ORAL_TABLET | Freq: Every day | ORAL | Status: DC
Start: 2012-06-01 — End: 2012-06-01
  Administered 2012-06-01: 10 mg via ORAL
  Filled 2012-06-01: qty 1

## 2012-06-01 MED ORDER — VITAMIN B-12 500 MCG PO TABS
500.00 ug | ORAL_TABLET | Freq: Every day | ORAL | Status: DC
Start: 2012-06-01 — End: 2012-06-01
  Administered 2012-06-01: 500 ug via ORAL
  Filled 2012-06-01: qty 1

## 2012-06-01 MED ORDER — SUCRALFATE 1 G PO TABS
1.0000 g | ORAL_TABLET | Freq: Every morning | ORAL | Status: DC
Start: 2012-06-02 — End: 2012-06-01

## 2012-06-01 MED ORDER — PANTOPRAZOLE SODIUM 40 MG PO TBEC
40.00 mg | DELAYED_RELEASE_TABLET | Freq: Every morning | ORAL | Status: DC
Start: 2012-06-01 — End: 2012-06-01
  Administered 2012-06-01: 40 mg via ORAL
  Filled 2012-06-01: qty 1

## 2012-06-01 MED ORDER — ASPIRIN 81 MG PO TBEC
81.0000 mg | DELAYED_RELEASE_TABLET | Freq: Every day | ORAL | Status: DC
Start: 2012-06-01 — End: 2012-06-01
  Administered 2012-06-01: 81 mg via ORAL
  Filled 2012-06-01: qty 1

## 2012-06-01 MED ORDER — MAGNESIUM OXIDE 400 MG PO TABS
400.0000 mg | ORAL_TABLET | Freq: Every day | ORAL | Status: DC
Start: 2012-06-01 — End: 2012-06-01
  Administered 2012-06-01: 400 mg via ORAL
  Filled 2012-06-01: qty 1

## 2012-06-01 NOTE — H&P (Signed)
Patient Type: I     ATTENDING PHYSICIAN: Susann Givens, MD     This is a 23-hour note.      HISTORY OF PRESENT ILLNESS:  This is a 79 year old American female who is known to have a history of  diabetes and hypertension and rheumatoid arthritis, who was brought in by  her friend because of complaints of right-sided neck pain, which is  reducible with neck movement and pressure in that area.  There were no  other symptoms and because of a slightly elevated troponin, she was  admitted for fear of acute coronary syndrome.     PAST MEDICAL HISTORY:  Includes:  1.  Rheumatoid arthritis.  2.  Hypertension.  3.  Type 2 diabetes mellitus.  4.  Allergic rhinitis.  5.  GERD.  6.  Vitamin D deficiency.     CURRENT MEDICATIONS:  Amlodipine 5 mg daily, aspirin 81 mg daily, calcium with vitamin D 1 tablet  a day, Zyrtec 10 mg daily, Lasix 20 mg daily, gabapentin 600 mg b.i.d.,  glipizide 5 mg b.i.d., NovoLog 3 units before every meal, lisinopril 2.5 mg  daily, magnesium oxide 400 mg daily, methotrexate 2.5 mg once a week,  pantoprazole 40 mg daily, prednisone 5 mg daily, sucralfate 1 gram daily,  Ultracet 37.5/325 mg q.6 hours p.r.n., vitamin B12 500 mg daily.     ALLERGIES:  Noted to ZITHROMAX, DILAUDID, and IODINE.     SOCIAL HISTORY:  She lives by herself and she gets home health aide.     PHYSICAL EXAMINATION:  VITAL SIGNS:  In the emergency room, BP 183/79, pulse 78, temperature 98.1.   Sp02 97% on room air.  HEENT:  PERRLA, EOMI.  NECK:  Supple, but she has tenderness on the right side of the neck and  especially if you move her neck to the left side.  LUNGS:  Clear.  HEART:  Sounds were regular.  ABDOMEN:  Benign.  EXTREMITIES:  No edema.  NEUROLOGIC:  She is quite alert and oriented.     INVESTIGATIONS:  Done so far:  Her CBC is normal.  HDL is 16, LDL is 100, cardiac enzymes  slightly elevated at 0.12.  Urinalysis was negative.  CT scan of the brain  shows no intracranial hemorrhage, mild to moderate microvascular  ischemic  changes.     HOSPITAL COURSE:  She was admitted to the telemetry unit.  She was in sinus rhythm.  She has  no chest pain whatsoever.  She does still complain of right neck pain.  She  was seen in conjunction with cardiology, who did not think that she had an  acute coronary syndrome, and she did have a stress test not too long ago,  which was actually negative.  To this effect, we will give her some  analgesics, we will put a heating pad on her neck.  She does have  rheumatoid arthritis, and more definitely osteoarthritis as well.  We will  be discharging her home to her home care.  There are no medications to be  prescribed for her.  She should follow up with her primary care physician  within about 1 week's time.           D:  06/01/2012 10:06 AM by Dr. Barnie Del A. Kevin Fenton, MD [phone])  T:  06/01/2012 10:38 AM by       Everlean Cherry: [id number]) (Doc ID: [id number])

## 2012-06-01 NOTE — Consults (Signed)
Service Date: 06/01/2012     Patient Type: I     CONSULTING PHYSICIAN: Stephens Shire MD     REFERRING PHYSICIAN: Nyra Jabs MD     REASON FOR CONSULTATION:  This patient was seen at the request of Dr. Kevin Fenton for evaluation of chest  pain.     HISTORY OF PRESENT ILLNESS:  [name] is a pleasant 79 year old female with a history of hypertension  and diabetes who presented to the emergency department with neck pain.  The  patient states that the pain is worse when she turns her head.  It radiates  to her head.  It is not exertional.  The patient also has had increasing  weakness and dizziness.  She denies any shortness of breath.  She has some  mild chest discomfort that is very short-lived and pleuritic.  It does not  radiate.  The patient was admitted for further monitoring.     REVIEW OF SYSTEMS:  Performed and positive for dizziness, weakness, nausea, neck pain as above.   The patient denies any cough, syncope, or near syncope.  Other systems  negative.     PAST MEDICAL HISTORY:  1.  Hypertension.  2.  Diabetes.  3.  Echocardiogram January 23 of this year, shows mild concentric LVH with  an EF of 65%, diastolic dysfunction, and mild aortic stenosis.  4.  Nuclear perfusion study in May 2013 showed an EF of 77% without  evidence of ischemia.     OUTPATIENT MEDICATIONS:  Amlodipine 5 mg daily, cetirizine, furosemide, gabapentin, glipizide,  insulin, lisinopril, methotrexate, pantoprazole, prednisone, sucralfate,  tramadol, Tylenol.     ALLERGIES:  AZITHROMYCIN, DILAUDID, ERYTHROMYCIN, IODINE.     FAMILY HISTORY:  Father with coronary disease.     SOCIAL HISTORY:  Does not smoke.     PHYSICAL EXAMINATION:  VITAL SIGNS:  Blood pressure 184/84, heart rate 68, respirations 16.  GENERAL:  Elderly female in no acute distress.  ENT:  No mucosal bleeding.  No conjunctival erythema.  NECK:  Brisk carotid upstrokes.  CHEST:  Decreased breath sounds over bases, no wheezes.  HEART:  Regular S1 and S2, I/VI systolic murmur  over right sternal border.  ABDOMEN:  Soft, nontender, nondistended.  EXTREMITIES:  Palpable pulses.  No edema.  MUSCULOSKELETAL:  Reproducible right lateral neck pain to palpation.     DIAGNOSTIC AND LABORATORY DATA:  ECG independently reviewed by me shows normal sinus rhythm without any  acute ST or T-wave changes.  Notable labs show troponin of 0.1 and 0.09,  hemoglobin 10.0, creatinine 1.0.     IMPRESSION:  [name] is a very pleasant 79 year old female with history of  hypertension and diabetes who presents with weakness and neck pain.  The  neck pain is reproducible and appears to be musculoskeletal in etiology.   There is no evidence of acute coronary syndrome by ECG or symptoms.  Her  cardiac enzymes were minimally elevated, but I do not believe that this is  significant enough to merit further workup.  The patient had a negative  stress test approximately 8 months ago.  The patient's blood pressure is  mildly elevated this morning.  We will give her home medications and her  dose of lisinopril can be increased if her blood pressure remains elevated.     Thank you for the consultation.  Please free to contact me for any  questions that may arise.           D:  06/01/2012 09:48 AM by Dr. Stephens Shire, MD ([id number])  T:  06/01/2012 11:21 AM by [id number]      Everlean Cherry: [id number]) (Doc ID: [id number])

## 2012-06-01 NOTE — Progress Notes (Signed)
Patient's BP elevated, 184/84, 188/86, informed Dr. Kevin Fenton, resumed home med and patient was given IV hydralazine 10mg  as ordered. BP decreased to 145/68. Pt medicated with combo dose of ultram and tylenol for c/o neck pain, covered with insulin for blood sugar as per sliding scale. Awaiting discharge at the moment.

## 2012-06-01 NOTE — Consults (Signed)
Consult dictated. 79 yo female presents with neck pain which is reproducible and occurs with movement. No evidence of ACS. No further ischemic eval required. Negative stress test in 5/13. Symptomatic tx for neck pain.

## 2012-06-01 NOTE — Plan of Care (Signed)
Pt received from the ED on a stretcher. Pt oriented to room and call bell system, verbalizes understanding. Pt A&O X4, sinus rhyhm on the monitor, VSS. Denies pain and SOB. Pt slept most of the time throughout the shift, pt able to make needs known, call bell within reach, will continue with current plan of care.

## 2012-06-01 NOTE — Plan of Care (Signed)
Patient was sent home. Discharge order received from Dr. Kevin Fenton. Discharge instructions given and patient verbalized the understanding. IV discontinued. Personal belongings sent. Pt's cash retrieved from security, [phone] given to patient, witnessed by [employer]. Pt left the unit in stable condition escorted in wheel chair by a staff member to emergency entrance. Pt's Niece with her at the time of discharge.

## 2012-07-14 ENCOUNTER — Observation Stay: Payer: Medicare Other | Admitting: Internal Medicine

## 2012-07-14 ENCOUNTER — Emergency Department: Payer: Medicare Other

## 2012-07-14 ENCOUNTER — Observation Stay
Admission: EM | Admit: 2012-07-14 | Discharge: 2012-07-15 | Disposition: A | Discharge status: Home / Self Care | Payer: Medicare Other | Attending: Internal Medicine | Admitting: Internal Medicine

## 2012-07-14 DIAGNOSIS — Z79899 Other long term (current) drug therapy: Secondary | ICD-10-CM | POA: Insufficient documentation

## 2012-07-14 DIAGNOSIS — E119 Type 2 diabetes mellitus without complications: Secondary | ICD-10-CM | POA: Insufficient documentation

## 2012-07-14 DIAGNOSIS — K219 Gastro-esophageal reflux disease without esophagitis: Secondary | ICD-10-CM | POA: Insufficient documentation

## 2012-07-14 DIAGNOSIS — R0789 Other chest pain: Principal | ICD-10-CM | POA: Insufficient documentation

## 2012-07-14 DIAGNOSIS — R079 Chest pain, unspecified: Secondary | ICD-10-CM | POA: Diagnosis present

## 2012-07-14 DIAGNOSIS — M069 Rheumatoid arthritis, unspecified: Secondary | ICD-10-CM | POA: Insufficient documentation

## 2012-07-14 DIAGNOSIS — I359 Nonrheumatic aortic valve disorder, unspecified: Secondary | ICD-10-CM | POA: Insufficient documentation

## 2012-07-14 LAB — CBC AND DIFFERENTIAL
Basophils Absolute Automated: 0.02 10*3/uL (ref 0.00–0.20)
Basophils Automated: 0 %
Eosinophils Absolute Automated: 0.15 10*3/uL (ref 0.00–0.70)
Eosinophils Automated: 2 %
Hematocrit: 29.5 % — ABNORMAL LOW (ref 37.0–47.0)
Hgb: 9.4 g/dL — ABNORMAL LOW (ref 12.0–16.0)
Immature Granulocytes Absolute: 0.02 10*3/uL
Immature Granulocytes: 0 %
Lymphocytes Absolute Automated: 1.53 10*3/uL (ref 0.50–4.40)
Lymphocytes Automated: 21 %
MCH: 31.8 pg (ref 28.0–32.0)
MCHC: 31.9 g/dL — ABNORMAL LOW (ref 32.0–36.0)
MCV: 99.7 fL (ref 80.0–100.0)
MPV: 10.6 fL (ref 9.4–12.3)
Monocytes Absolute Automated: 0.71 10*3/uL (ref 0.00–1.20)
Monocytes: 10 %
Neutrophils Absolute: 4.8 10*3/uL (ref 1.80–8.10)
Neutrophils: 67 %
Nucleated RBC: 0 /100 WBC (ref 0–1)
Platelets: 238 10*3/uL (ref 140–400)
RBC: 2.96 10*6/uL — ABNORMAL LOW (ref 4.20–5.40)
RDW: 15 % (ref 12–15)
WBC: 7.21 10*3/uL (ref 3.50–10.80)

## 2012-07-14 LAB — COMPREHENSIVE METABOLIC PANEL
ALT: 29 U/L (ref 0–55)
AST (SGOT): 33 U/L (ref 5–34)
Albumin/Globulin Ratio: 0.6 — ABNORMAL LOW (ref 0.9–2.2)
Albumin: 2.8 g/dL — ABNORMAL LOW (ref 3.5–5.0)
Alkaline Phosphatase: 191 U/L — ABNORMAL HIGH (ref 40–150)
BUN: 17 mg/dL (ref 7–21)
Bilirubin, Total: 0.4 mg/dL (ref 0.2–1.2)
CO2: 23 mEq/L (ref 22–29)
Calcium: 8.5 mg/dL (ref 7.9–10.6)
Chloride: 105 mEq/L (ref 98–107)
Creatinine: 1.1 mg/dL — ABNORMAL HIGH (ref 0.6–1.0)
Globulin: 4.4 g/dL — ABNORMAL HIGH (ref 2.0–3.6)
Glucose: 298 mg/dL — ABNORMAL HIGH (ref 70–100)
Potassium: 4.2 mEq/L (ref 3.5–5.1)
Protein, Total: 7.2 g/dL (ref 6.0–8.3)
Sodium: 139 mEq/L (ref 136–145)

## 2012-07-14 LAB — TROPONIN I: Troponin I: 0.01 ng/mL (ref 0.00–0.09)

## 2012-07-14 LAB — CK: Creatine Kinase (CK): 75 U/L (ref 29–168)

## 2012-07-14 LAB — GFR: EGFR: 55.9

## 2012-07-14 MED ORDER — ASPIRIN 81 MG PO TBEC
81.0000 mg | DELAYED_RELEASE_TABLET | Freq: Every day | ORAL | Status: DC
Start: 2012-07-15 — End: 2012-07-15
  Administered 2012-07-15: 81 mg via ORAL
  Filled 2012-07-14: qty 1

## 2012-07-14 MED ORDER — ACETAMINOPHEN 325 MG PO TABS
650.0000 mg | ORAL_TABLET | ORAL | Status: DC | PRN
Start: 2012-07-14 — End: 2012-07-15

## 2012-07-14 MED ORDER — PREDNISONE 5 MG PO TABS
5.0000 mg | ORAL_TABLET | Freq: Every morning | ORAL | Status: DC
Start: 2012-07-14 — End: 2012-07-15
  Administered 2012-07-15: 5 mg via ORAL
  Filled 2012-07-14 (×2): qty 1

## 2012-07-14 MED ORDER — MAGNESIUM OXIDE 400 MG PO TABS
400.00 mg | ORAL_TABLET | Freq: Every day | ORAL | Status: DC
Start: 2012-07-14 — End: 2012-07-15
  Administered 2012-07-15: 400 mg via ORAL
  Filled 2012-07-14 (×2): qty 1

## 2012-07-14 MED ORDER — ASPIRIN 81 MG PO CHEW
324.00 mg | CHEWABLE_TABLET | Freq: Once | ORAL | Status: AC
Start: 2012-07-14 — End: 2012-07-14
  Administered 2012-07-14: 324 mg via ORAL
  Filled 2012-07-14: qty 4

## 2012-07-14 MED ORDER — ACETAMINOPHEN 325 MG PO TABS
650.0000 mg | ORAL_TABLET | Freq: Once | ORAL | Status: AC
Start: 2012-07-14 — End: 2012-07-14
  Administered 2012-07-14: 650 mg via ORAL
  Filled 2012-07-14: qty 2

## 2012-07-14 MED ORDER — INSULIN GLARGINE 100 UNIT/ML SC SOLN
15.0000 [IU] | Freq: Every evening | SUBCUTANEOUS | Status: DC
Start: 2012-07-14 — End: 2012-07-15
  Administered 2012-07-14: 15 [IU] via SUBCUTANEOUS
  Filled 2012-07-14: qty 150

## 2012-07-14 MED ORDER — NAPROXEN 250 MG PO TABS
250.00 mg | ORAL_TABLET | Freq: Two times a day (BID) | ORAL | Status: DC
Start: 2012-07-14 — End: 2012-07-15
  Administered 2012-07-14 – 2012-07-15 (×2): 250 mg via ORAL
  Filled 2012-07-14 (×2): qty 1

## 2012-07-14 MED ORDER — ENOXAPARIN SODIUM 40 MG/0.4ML SC SOLN
30.00 mg | Freq: Every evening | SUBCUTANEOUS | Status: DC
Start: 2012-07-14 — End: 2012-07-15
  Administered 2012-07-14: 30 mg via SUBCUTANEOUS
  Filled 2012-07-14: qty 0.4

## 2012-07-14 MED ORDER — AMLODIPINE BESYLATE 5 MG PO TABS
5.0000 mg | ORAL_TABLET | Freq: Every day | ORAL | Status: DC
Start: 2012-07-14 — End: 2012-07-15
  Administered 2012-07-14 – 2012-07-15 (×2): 5 mg via ORAL
  Filled 2012-07-14 (×2): qty 1

## 2012-07-14 MED ORDER — GLIPIZIDE 5 MG PO TABS
5.00 mg | ORAL_TABLET | Freq: Two times a day (BID) | ORAL | Status: DC
Start: 2012-07-15 — End: 2012-07-15
  Administered 2012-07-15: 5 mg via ORAL
  Filled 2012-07-14: qty 1

## 2012-07-14 MED ORDER — SUCRALFATE 1 G PO TABS
1.0000 g | ORAL_TABLET | Freq: Every day | ORAL | Status: DC
Start: 2012-07-14 — End: 2012-07-15
  Administered 2012-07-15: 1 g via ORAL
  Filled 2012-07-14 (×2): qty 1

## 2012-07-14 MED ORDER — PANTOPRAZOLE SODIUM 40 MG PO TBEC
40.0000 mg | DELAYED_RELEASE_TABLET | Freq: Every morning | ORAL | Status: DC
Start: 2012-07-14 — End: 2012-07-15
  Administered 2012-07-15: 40 mg via ORAL
  Filled 2012-07-14 (×2): qty 1

## 2012-07-14 MED ORDER — SODIUM CHLORIDE 0.9 % IJ SOLN
3.00 mL | Freq: Three times a day (TID) | INTRAMUSCULAR | Status: DC
Start: 2012-07-14 — End: 2012-07-15
  Administered 2012-07-14 – 2012-07-15 (×2): 3 mL via INTRAVENOUS
  Filled 2012-07-14 (×2): qty 10

## 2012-07-14 MED ORDER — VITAMIN B-12 500 MCG PO TABS
500.0000 ug | ORAL_TABLET | Freq: Every day | ORAL | Status: DC
Start: 2012-07-14 — End: 2012-07-15
  Administered 2012-07-14 – 2012-07-15 (×2): 500 ug via ORAL
  Filled 2012-07-14 (×2): qty 1

## 2012-07-14 MED ORDER — GABAPENTIN 300 MG PO CAPS
600.0000 mg | ORAL_CAPSULE | Freq: Two times a day (BID) | ORAL | Status: DC
Start: 2012-07-14 — End: 2012-07-15
  Administered 2012-07-14 – 2012-07-15 (×2): 600 mg via ORAL
  Filled 2012-07-14 (×2): qty 2

## 2012-07-14 MED ORDER — FUROSEMIDE 20 MG PO TABS
20.00 mg | ORAL_TABLET | Freq: Every day | ORAL | Status: DC
Start: 2012-07-14 — End: 2012-07-15
  Administered 2012-07-15: 20 mg via ORAL
  Filled 2012-07-14 (×2): qty 1

## 2012-07-14 NOTE — ED Notes (Signed)
Pt reports chest and back pain X3 days. Mild SOB. Denies nausea, vomiting, diarrhea, or arm pain.

## 2012-07-14 NOTE — ED Provider Notes (Addendum)
EMERGENCY DEPARTMENT NOTE    Physician/Midlevel provider first contact with patient: 07/14/12 1716         HISTORY OF PRESENT ILLNESS   Historian:Patient  Translator Used: No    79 y.o. female presents to the ed c/o cp since yesterday. Pt states she's had cp and back pain intermittently since yesterday. Denies cp now.  States she vomited yesterday. No abd pain. No cp now.  Pt c/o right neck pain.  Pt also c/o sob.    1. Location of symptoms: chest, right neck  2. Onset of symptoms: yesterday  3. What was patient doing when symptoms started (Context): nothing  4. Severity: moderate  5. Timing: gradual  6. Activities that worsen symptoms: nothing  7. Activities that improve symptoms: nothing  8. Quality: sharp  9. Radiation of symptoms: none  10. Associated signs and Symptoms:nausea, sob, right sided neck pain  11. Are symptoms worsening? no  MEDICAL HISTORY     Past Medical History:  Past Medical History   Diagnosis Date   . Diabetes mellitus without complication    . Hypertensive disorder    . Arthritis      rheumatoid       Past Surgical History:  History reviewed. No pertinent past surgical history.    Social History:  History     Social History   . Marital Status: Widowed     Spouse Name: N/A     Number of Children: N/A   . Years of Education: N/A     Occupational History   . Not on file.     Social History Main Topics   . Smoking status: Never Smoker    . Smokeless tobacco: Not on file   . Alcohol Use: No   . Drug Use: No   . Sexually Active: Not on file     Other Topics Concern   . Not on file     Social History Narrative   . No narrative on file       Family History:  No family history on file.    Outpatient Medication:  Previous Medications    ACETAMINOPHEN (TYLENOL) 500 MG TABLET    Take 1,000 mg by mouth every 6 (six) hours as needed.    AMLODIPINE (NORVASC) 5 MG TABLET    Take 5 mg by mouth daily.    ASPIRIN 81 MG TABLET    Take 81 mg by mouth daily.    CALCIUM-VITAMIN D (OSCAL-500) 500-200 MG-UNIT PER  TABLET    Take 1 tablet by mouth daily.    CETIRIZINE (ZYRTEC) 10 MG TABLET    Take 10 mg by mouth daily.    FUROSEMIDE (LASIX) 20 MG TABLET    Take 20 mg by mouth daily.     GABAPENTIN (NEURONTIN) 600 MG TABLET    Take 600 mg by mouth 2 (two) times daily.    GLIPIZIDE (GLUCOTROL) 5 MG TABLET    Take 5 mg by mouth 2 (two) times daily before meals.    INSULIN ASPART (NOVOLOG) 100 UNIT/ML INJECTION    Inject into the skin 3 (three) times daily before meals. Sliding scale    INSULIN GLARGINE (LANTUS) 100 UNIT/ML INJECTION    Inject 15 Units into the skin nightly.    LISINOPRIL (PRINIVIL,ZESTRIL) 2.5 MG TABLET    Take 1 tablet (2.5 mg total) by mouth daily.    MAGNESIUM OXIDE (MAG-OX) 400 MG TABLET    Take 400 mg by mouth daily.  METHOTREXATE (RHEUMATREX) 2.5 MG TABLET    Take 7.5 mg by mouth every mon, wed and fri.    PANTOPRAZOLE (PROTONIX) 40 MG TABLET    Take 40 mg by mouth daily.    PREDNISONE (DELTASONE) 5 MG TABLET    Take 5 mg by mouth daily.    SUCRALFATE (CARAFATE) 1 G TABLET    Take 1 g by mouth daily.    TRAMADOL-ACETAMINOPHEN (ULTRACET) 37.5-325 MG PER TABLET    Take 1 tablet by mouth every 6 (six) hours as needed.    VITAMIN B-12 (CYANOCOBALAMIN) 500 MCG TABLET    Take 500 mcg by mouth daily.         REVIEW OF SYSTEMS   Review of Systems   HENT: Positive for neck pain.    Respiratory: Positive for shortness of breath.    Cardiovascular: Positive for chest pain.   Gastrointestinal: Positive for nausea and vomiting. Negative for abdominal pain.   All other systems reviewed and are negative.        PHYSICAL EXAM   ED Triage Vitals   Enc Vitals Group      BP 07/14/12 1713 167/79 mmHg      Heart Rate 07/14/12 1713 86       Resp Rate 07/14/12 1713 20       Temp 07/14/12 1713 97.3 F (36.3 C)      Temp src --       SpO2 07/14/12 1713 99 %      Weight --       Height --       Head Cir --       Peak Flow --       Pain Score 07/14/12 1713 10      Pain Loc --       Pain Edu? --       Excl. in GC? --        Nursing  note and vitals reviewed.  Constitutional:  Well developed, well nourished. Awake & Oriented x3.  Head:  Atraumatic. Normocephalic.    Eyes:  PERRL. EOMI. Conjunctivae are not pale.  ENT:  Mucous membranes are moist and intact. Oropharynx is clear and symmetric.  Patent airway.  Neck:  Supple. Full ROM.  Severe kyphosis.  Tenderness right neck.  Cardiovascular:  Regular rate. Regular rhythm. No murmurs, rubs, or gallops.  Pulmonary/Chest:  No evidence of respiratory distress. Clear to auscultation bilaterally.  No wheezing, rales or rhonchi.   Abdominal:  Soft and non-distended. There is no tenderness. No rebound, guarding, or rigidity.  Back:  Full ROM. Nontender.  Extremities:  No edema. No cyanosis. No clubbing. Full range of motion in all extremities.  Skin:  Skin is warm and dry.  No diaphoresis. No rash.   Neurological:  Alert, awake, and appropriate. Normal speech. Motor normal.  Psychiatric:  Good eye contact. Normal interaction, affect, and behavior.    MEDICAL DECISION MAKING     CXR to r/o pneumonia  Troponin to r/o ischemia      Admit to dr Greggory Stallion silis for cp r/o acs  Pt and family agree with admission  Differential diagnosis includes cad, angina,   DISCUSSION      Vital Signs: Reviewed the patient?s vital signs.   Nursing Notes: Reviewed and utilized available nursing notes.  Medical Records Reviewed: Reviewed available past medical records.  Counseling: The emergency provider has spoken with the patient and discussed today?s findings, in addition to providing specific details for the  plan of care.  Questions are answered and there is agreement with the plan.      CARDIAC STUDIES    The following cardiac studies were independently interpreted by the Emergency Medicine Physician.  For full cardiac study results please see chart.    Monitor Strip  Interpreted by ED Physician  Rate:75  Rhythm: nsr  ST Changes: none    EKG Interpretation:  Signed and interpreted byED Physician   Time Interpreted:    Comparison:   Rate: 77  Rhythm: NSR  Axis: Normal  Intervals: Normal  Blocks: None  ST segments: No acute changes.  Interpretation: NSR with sinus arrhytmia    IMAGING STUDIES    The following imaging studies were independently interpreted by the Emergency Medicine Physician.  For full imaging study results please see chart.      PULSE OXIMETRY    Oxygen Saturation by Pulse Oximetry: 99%  Interventions: none  Interpretation: nml    EMERGENCY DEPT. MEDICATIONS      ED Medication Orders      Start     Status Ordering Provider    07/14/12 1800   aspirin chewable tablet 324 mg   Once      Route: Oral  Ordered Dose: 324 mg         Last MAR action:  Given Ryin Ambrosius G                LABORATORY RESULTS    Ordered and independently interpreted AVAILABLE laboratory tests. Please see results section in chart for full details.  Results for orders placed during the hospital encounter of 07/14/12   COMPREHENSIVE METABOLIC PANEL       Component Value Range    Glucose 298 (*) 70 - 100 mg/dL    BUN 17  7 - 21 mg/dL    Creatinine 1.1 (*) 0.6 - 1.0 mg/dL    Sodium 161  096 - 045 mEq/L    Potassium 4.2  3.5 - 5.1 mEq/L    Chloride 105  98 - 107 mEq/L    CO2 23  22 - 29 mEq/L    Calcium 8.5  7.9 - 10.6 mg/dL    Protein, Total 7.2  6.0 - 8.3 g/dL    Albumin 2.8 (*) 3.5 - 5.0 g/dL    AST (SGOT) 33  5 - 34 U/L    ALT 29  0 - 55 U/L    Alkaline Phosphatase 191 (*) 40 - 150 U/L    Bilirubin, Total 0.4  0.2 - 1.2 mg/dL    Globulin 4.4 (*) 2.0 - 3.6 g/dL    Albumin/Globulin Ratio 0.6 (*) 0.9 - 2.2   CBC AND DIFFERENTIAL       Component Value Range    WBC 7.21  3.50 - 10.80 x10 3/uL    RBC 2.96 (*) 4.20 - 5.40 x10 6/uL    Hgb 9.4 (*) 12.0 - 16.0 g/dL    Hematocrit 40.9 (*) 37.0 - 47.0 %    MCV 99.7  80.0 - 100.0 fL    MCH 31.8  28.0 - 32.0 pg    MCHC 31.9 (*) 32.0 - 36.0 g/dL    RDW 15  12 - 15 %    Platelets 238  140 - 400 x10 3/uL    MPV 10.6  9.4 - 12.3 fL    Neutrophils 67  None %    Lymphocytes Automated 21  None %    Monocytes 10   None %  Eosinophils Automated 2  None %    Basophils Automated 0  None %    Immature Granulocyte 0  None %    Nucleated RBC 0  0 - 1 /100 WBC    Neutrophils Absolute 4.80  1.80 - 8.10 x10 3/uL    Abs Lymph Automated 1.53  0.50 - 4.40 x10 3/uL    Abs Mono Automated 0.71  0.00 - 1.20 x10 3/uL    Abs Eos Automated 0.15  0.00 - 0.70 x10 3/uL    Absolute Baso Automated 0.02  0.00 - 0.20 x10 3/uL    Absolute Immature Granulocyte 0.02  0 x10 3/uL   CK       Component Value Range    Creatine Kinase (CK) 75  29 - 168 U/L   TROPONIN I       Component Value Range    Troponin I 0.01  0.00 - 0.09 ng/mL   GFR       Component Value Range    EGFR 55.9         CONSULTATIONS    Discussed patient case with Dr. Shirleen Schirmer who agrees to admit pt  1625: Discussed with dr reddy - [facility] on consult    CRITICAL CARE/PROCEDURES        DIAGNOSIS      Diagnosis:  Final diagnoses:   Chest pain, atypical   Diabetes       Disposition:  ED Disposition     Observation Admitting Physician: Mechele Dawley [[id number]]  Diagnosis: Chest pain, atypical [[id number]]  Estimated Length of Stay: < 2 midnights  Tentative Discharge Plan?: Home or Self Care [1]  Patient Class: Observation [104]            Prescriptions:  Patient's Medications   New Prescriptions    No medications on file   Previous Medications    ACETAMINOPHEN (TYLENOL) 500 MG TABLET    Take 1,000 mg by mouth every 6 (six) hours as needed.    AMLODIPINE (NORVASC) 5 MG TABLET    Take 5 mg by mouth daily.    ASPIRIN 81 MG TABLET    Take 81 mg by mouth daily.    CALCIUM-VITAMIN D (OSCAL-500) 500-200 MG-UNIT PER TABLET    Take 1 tablet by mouth daily.    CETIRIZINE (ZYRTEC) 10 MG TABLET    Take 10 mg by mouth daily.    FUROSEMIDE (LASIX) 20 MG TABLET    Take 20 mg by mouth daily.     GABAPENTIN (NEURONTIN) 600 MG TABLET    Take 600 mg by mouth 2 (two) times daily.    GLIPIZIDE (GLUCOTROL) 5 MG TABLET    Take 5 mg by mouth 2 (two) times daily before meals.    INSULIN ASPART (NOVOLOG) 100  UNIT/ML INJECTION    Inject into the skin 3 (three) times daily before meals. Sliding scale    INSULIN GLARGINE (LANTUS) 100 UNIT/ML INJECTION    Inject 15 Units into the skin nightly.    LISINOPRIL (PRINIVIL,ZESTRIL) 2.5 MG TABLET    Take 1 tablet (2.5 mg total) by mouth daily.    MAGNESIUM OXIDE (MAG-OX) 400 MG TABLET    Take 400 mg by mouth daily.    METHOTREXATE (RHEUMATREX) 2.5 MG TABLET    Take 7.5 mg by mouth every mon, wed and fri.    PANTOPRAZOLE (PROTONIX) 40 MG TABLET    Take 40 mg by mouth daily.    PREDNISONE (DELTASONE) 5 MG TABLET  Take 5 mg by mouth daily.    SUCRALFATE (CARAFATE) 1 G TABLET    Take 1 g by mouth daily.    TRAMADOL-ACETAMINOPHEN (ULTRACET) 37.5-325 MG PER TABLET    Take 1 tablet by mouth every 6 (six) hours as needed.    VITAMIN B-12 (CYANOCOBALAMIN) 500 MCG TABLET    Take 500 mcg by mouth daily.   Modified Medications    No medications on file   Discontinued Medications    No medications on file           Loetta Rough, DO  07/14/12 1935    Loetta Rough, DO  07/14/12 2028

## 2012-07-14 NOTE — ED Notes (Signed)
Called Radiology about Portable X-Ray

## 2012-07-14 NOTE — H&P (Signed)
ADMISSION HISTORY AND PHYSICAL EXAM    Date Time: 07/14/2012 9:11 PM  Patient Name: [name]  Attending Physician: Loetta Rough, DO      History of Present Illness:   [name] is a 79 y.o. female who presents to the hospital with Chief Complaint   Patient presents with   . Chest Pain     The patient has a history of diabetes, rheumatoid arthritis, hypertension.   The patient presents to [facility] with  chest pain which occurred yesterday evening.  The patient is accompanied by  her daughter.  Normally lives alone.  Apparently has some pain in her right  neck as well.  The pain is not associated with exertion.  No syncope or  near syncope.  No shortness of breath.  Possible episode of vomiting  yesterday and none today.  The patient is currently pain free and has been  directed to the emergency room for further treatment and evaluation.      Past Medical History:     Past Medical History   Diagnosis Date   . Diabetes mellitus without complication    . Hypertensive disorder    . Arthritis      rheumatoid       Past Surgical History:   History reviewed. No pertinent past surgical history.    Family History:   No known sudden death or premature malignancy.  Parents are deceased.        Social History:     History     Social History   . Marital Status: Widowed     Spouse Name: N/A     Number of Children: N/A   . Years of Education: N/A     Social History Main Topics   . Smoking status: Never Smoker    . Smokeless tobacco: Not on file   . Alcohol Use: No   . Drug Use: No   . Sexually Active: Not on file     Other Topics Concern   . Not on file     Social History Narrative   . No narrative on file     The patient lives on her own with assistance from her daughter and friends  from her church.      Allergies:     Allergies   Allergen Reactions   . Azithromycin    . Dilaudid (Hydromorphone Hcl)    . Erythromycin    . Iodine      Iv contrast       Medications:   Norvasc,  aspirin, Zyrtec, Lasix, Neurontin, Glucotrol, NovoLog, Lantus,  Zestril, magnesium oxide, methotrexate, Protonix, prednisone, Carafate,  Ultracet, vitamin B12.        Review of Systems:    A comprehensive review of systems was: General ROS: negative for - chills, fever or night sweats  Psychological ROS: negative for - anxiety, depression, disorientation, hallucinations or suicidal ideation  ENT ROS: negative for - headaches, nasal congestion, sinus pain or visual changes  Allergy and Immunology ROS: negative for - itchy/watery eyes, nasal congestion or postnasal drip  Hematological and Lymphatic ROS: negative for - bleeding problems, bruising, pallor or weight loss  Endocrine ROS: negative for - malaise/lethargy or polydipsia/polyuria  Respiratory ROS: negative for - cough, orthopnea, shortness of breath or wheezing  Cardiovascular ROS:  Chest pain yesterday non exertional no syncope or near syncope  Vomit times one  Gastrointestinal ROS: negative for - abdominal pain, constipation, diarrhea  or nausea/vomiting  Genito-Urinary ROS: negative for - change in urinary stream, dysuria, hematuria or nocturia some poss bladder discomfort  Musculoskeletal ROS: negative for - joint pain, joint stiffness or joint swelling  Neurological ROS: negative for - confusion, dizziness, headaches, memory loss or speech problems  Dermatological ROS: negative for pruritus, rash and skin lesion changes      Physical Exam:     Filed Vitals:    07/14/12 1941   BP: 162/72   Pulse: 74   Temp:    Resp: 18   SpO2: 100%       Intake and Output Summary (Last 24 hours) at Date Time  No intake or output data in the 24 hours ending 07/14/12 2111     General appearance - alert, well appearing, and in no distress thin frail  Mental status - alert, oriented to person, place, and time  Eyes - pupils equal and reactive, extraocular eye movements intact  Ears -   external ear canals normal  Nose - normal and patent, no erythema, discharge or polyps  Mouth  - mucous membranes moist, pharynx normal without lesions  Neck - supple, no significant adenopathy  Lymphatics - no palpable lymphadenopathy, no hepatosplenomegaly  Chest - clear to auscultation, no wheezes, rales or rhonchi, symmetric air entry  Heart - normal rate, regular rhythm, normal S1, S2, no murmurs, rubs, clicks or gallops  Abdomen - soft, nontender, nondistended, no masses or organomegaly min tend bladder region no rebound  Neurological - alert, oriented, normal speech, no focal findings or movement disorder noted  Musculoskeletal - no joint tenderness, deformity or swelling  Extremities - peripheral pulses normal, no pedal edema, no clubbing or cyanosis  Skin - normal coloration and turgor, no rashes, no suspicious skin lesions noted    Labs:       Lab 07/14/12 1731   NA 139   K 4.2   CL 105   CO2 23   BUN 17   CREAT 1.1*   CA 8.5   ALB 2.8*   PROT 7.2   BILITOTAL 0.4   ALKPHOS 191*   ALT 29   AST 33   GLU 298*       Lab 07/14/12 1731   WBC 7.21   HGB 9.4*   HCT 29.5*   PLT 238           Rads:   Radiological Procedure reviewed.  Radiology Results (24 Hour)     Procedure Component Value Units Date/Time    Chest AP Portable [[id number]] Collected:07/14/12 1746    Order Status:Completed  Updated:07/14/12 1753    Narrative:    INDICATION: Chest pain.    TECHNIQUE: Single AP view of the chest is compared to 03/24/2012.    FINDINGS: There are low lung volumes. There is subtle right basilar  opacity, probable subsegmental atelectasis or mild scarring.  No  significant pleural effusions are seen. There is biapical pleural  thickening noted. The aorta appears tortuous. Cardiac contours are  unchanged. No pneumothorax is appreciated.      Impression:     Subtle right basilar opacity which probably represent  subsegmental atelectasis or mild scarring; however, early infiltrate  cannot be entirely excluded. Correlation with symptoms and physical exam  findings recommended.    Gustavus Messing, MD   07/14/2012 5:49 PM           Assessment:    The patient is a 79 year old female with atypical chest pain, history of  musculoskeletal  pain, history of arthritis, possible rheumatoid arthritis,  type 2 diabetes, GERD, debility, rule out UTI.        Plan:    The  plan will be to admit.  Observation.  Obtain serial cardiac enzymes.   Will obtain a UA, C and S.  Will review her chest x-ray.  Will have  cardiology evaluate the patient, normally followed by Dr. Franchot Erichsen.       Will resume her outpatient medications including Norvasc 5 mg daily, Lasix  20 mg daily, Glucotrol 5 mg daily, Lantus insulin 15 units daily, magnesium  oxide 400 mg daily, Naprosyn 250 mg b.i.d., Protonix 40 mg daily,  prednisone 5 mg daily, vitamin B12 500 mcg daily.          Signed by: Mechele Dawley, MD

## 2012-07-15 LAB — URINALYSIS WITH MICROSCOPIC
Bilirubin, UA: NEGATIVE
Blood, UA: NEGATIVE
Glucose, UA: 50 — AB
Ketones UA: NEGATIVE
Leukocyte Esterase, UA: NEGATIVE
Nitrite, UA: NEGATIVE
Protein, UR: NEGATIVE
Specific Gravity UA: 1.006 (ref 1.001–1.035)
Urine pH: 6 (ref 5.0–8.0)
Urobilinogen, UA: NORMAL mg/dL

## 2012-07-15 LAB — LIPID PANEL
Cholesterol / HDL Ratio: 5.4 Index
Cholesterol: 150 mg/dL (ref 0–199)
HDL: 28 mg/dL — ABNORMAL LOW (ref >40)
LDL Calculated: 97 mg/dL (ref 0–99)
Triglycerides: 127 mg/dL (ref 34–149)
VLDL Calculated: 25 mg/dL (ref 10–40)

## 2012-07-15 LAB — POCT GLUCOSE
Whole Blood Glucose POCT: 180 mg/dL — AB (ref 70–100)
Whole Blood Glucose POCT: 130 mg/dL — AB (ref 70–100)

## 2012-07-15 LAB — TROPONIN I
Troponin I: <0.01 ng/mL (ref 0.00–0.09)
Troponin I: 0.02 ng/mL (ref 0.00–0.09)

## 2012-07-15 LAB — HEMOLYSIS INDEX: Hemolysis Index: 7 Index (ref 0–9)

## 2012-07-15 MED ORDER — SODIUM CHLORIDE 0.9 % IV SOLN
120.0000 mg | Freq: Once | INTRAVENOUS | Status: DC
Start: 2012-07-15 — End: 2012-07-15
  Filled 2012-07-15: qty 3

## 2012-07-15 MED ORDER — ACETAMINOPHEN-CODEINE #3 300-30 MG PO TABS
1.00 | ORAL_TABLET | ORAL | Status: DC | PRN
Start: 2012-07-15 — End: 2012-07-15
  Administered 2012-07-15: 1 via ORAL
  Filled 2012-07-15: qty 1

## 2012-07-15 NOTE — Final Progress Note (DC Note for stay less than 48 (Signed)
PROGRESS NOTE    Date Time: 07/15/2012 2:21 PM  Patient Name: [name], [name]      Subjective:    Patient awake, alert, feels well.  No pain.        Review of Systems:   A comprehensive review of systems was: General ROS: negative for - chills, fever or night sweats  ENT ROS: negative for - headaches, nasal congestion, sinus pain or visual changes  Respiratory ROS: negative for - cough, orthopnea, shortness of breath or wheezing  Cardiovascular ROS: negative for - chest pain, dyspnea on exertion, orthopnea or shortness of breath  Gastrointestinal ROS: negative for - abdominal pain, constipation, diarrhea or nausea/vomiting  Genito-Urinary ROS: negative for - change in urinary stream, dysuria, hematuria or nocturia  Musculoskeletal ROS: negative for - joint pain, joint stiffness or joint swelling  Neurological ROS: negative for - confusion, dizziness, headaches, memory loss or speech problems    Physical Exam:     Filed Vitals:    07/15/12 1124   BP: 170/75   Pulse: 65   Temp: 95 F (35 C)   Resp: 20   SpO2: 98%       General appearance - alert, well appearing, and in no distress  Mental status - alert, oriented to person, place, and time  Eyes - pupils equal and reactive, extraocular eye movements intact  Nose - normal and patent, no erythema, discharge or polyps  Mouth - mucous membranes moist, pharynx normal without lesions  Neck - supple, no significant adenopathy  Lymphatics - no palpable lymphadenopathy, no hepatosplenomegaly  Chest - clear to auscultation, no wheezes, rales or rhonchi, symmetric air entry  Heart - normal rate, regular rhythm, normal S1, S2, no murmurs, rubs, clicks or gallops  Abdomen - soft, nontender, nondistended, no masses or organomegaly  Neurological - alert, oriented, normal speech, no focal findings or movement disorder noted  Musculoskeletal - no joint tenderness, deformity or swelling  Extremities - peripheral pulses normal, no pedal edema, no clubbing or cyanosis  Skin - normal  coloration and turgor, no rashes, no suspicious skin lesions noted    Medications:     Current Facility-Administered Medications   Medication Dose Route Frequency   . [COMPLETED] acetaminophen  650 mg Oral Once   . amLODIPine  5 mg Oral Daily   . [COMPLETED] aspirin  324 mg Oral Once   . aspirin EC  81 mg Oral Daily   . enoxaparin  30 mg Subcutaneous QHS   . furosemide  20 mg Oral Daily   . gabapentin  600 mg Oral BID   . glipiZIDE  5 mg Oral BID AC   . insulin glargine  15 Units Subcutaneous QHS   . magnesium oxide  400 mg Oral Daily   . naproxen  250 mg Oral BID   . pantoprazole  40 mg Oral QAM AC   . predniSONE  5 mg Oral QAM W/BREAKFAST   . sodium chloride (PF)  3 mL Intravenous Q8H   . sucralfate  1 g Oral Daily   . vitamin B-12  500 mcg Oral Daily       Intake and Output Summary (Last 24 hours) at Date Time  No intake or output data in the 24 hours ending 07/15/12 1421        Labs:     Lab 07/14/12 1731   NA 139   K 4.2   CL 105   CO2 23   BUN 17   CREAT 1.1*  CA 8.5   ALB 2.8*   PROT 7.2   BILITOTAL 0.4   ALKPHOS 191*   ALT 29   AST 33   GLU 298*       Lab 07/14/12 1731   WBC 7.21   HGB 9.4*   HCT 29.5*   PLT 238               Rads:     Radiology Results (24 Hour)     Procedure Component Value Units Date/Time    Chest AP Portable [[id number]] Collected:07/14/12 1746    Order Status:Completed  Updated:07/14/12 1753    Narrative:    INDICATION: Chest pain.    TECHNIQUE: Single AP view of the chest is compared to 03/24/2012.    FINDINGS: There are low lung volumes. There is subtle right basilar  opacity, probable subsegmental atelectasis or mild scarring.  No  significant pleural effusions are seen. There is biapical pleural  thickening noted. The aorta appears tortuous. Cardiac contours are  unchanged. No pneumothorax is appreciated.      Impression:     Subtle right basilar opacity which probably represent  subsegmental atelectasis or mild scarring; however, early infiltrate  cannot be entirely excluded.  Correlation with symptoms and physical exam  findings recommended.    Gustavus Messing, MD   07/14/2012 5:49 PM            Assessment:      The patient is a 79 year old female with resolved atypical chest pain,  history of musculoskeletal pain, arthritis, RA, type 2 diabetes, GERD.  Her  condition is stable.      Plan:      Plan will be to discharge to home.  Her urinalysis and micro are negative.   The patient will follow up with Dr. Franchot Erichsen.  She will continue her  current medical regimen.      Signed by: Mechele Dawley, MD  07/15/2012  2:21 PM

## 2012-07-15 NOTE — Consults (Signed)
[facility] CONSULTATION REPORT    Date Time: 07/15/2012 8:56 AM  Patient Name: [name], [name]  Requesting Physician: Mechele Dawley, MD     Signed by: Governor Specking, MD  OFFICE [phone]  MD LINE [phone]    Reason for Consultation:   Chest pain    Assessment:   79 y.o. female with a history of HTN, HL, DM, mild aortic stenosis, RA, who presents with chest pain.     Plan:   1. Chest pain.  She has ruled out for ACS by EKG and serial enzymes.  Her pain seems musculoskeletal in nature.  She has had a negative stress test within a year.  Given her age and the nature of her pain, no further cardiac workup is planned at this time.   2. HTN.  Adequately controlled.   3. Aortic stenosis.  Mild by echo in 1/13.  No need for repeat echo at this time.     No active cardiac issues at this time.  Patient can be discharged from a cardiac perspective and follow up with Dr. Franchot Erichsen after discharge.  Thank you for this consult.     History:   [name] is a 79 y.o. female with a history of HTN, HL, DM, mild aortic stenosis, RA, who presents with chest pain.  She has had total body myalgias and pain since Thursday.  Her back has been bothering her the most which is worsened with movement.  In this setting, she also had chest pain.  She is not sure of aggravating or alleviating factors, but her chest pain is much improved since being in the hospital.  Her other body pains are also improved, although she still is having some back pain.  She denies fevers, cough, or SOB.  She thinks her pains may be from a flair of her RA.      She had a stress test 5/13 that was negative for ischemia.     Past Medical History:     Past Medical History   Diagnosis Date   . Diabetes mellitus without complication    . Hypertensive disorder    . Arthritis      rheumatoid       Past Surgical History:   History reviewed. No pertinent past surgical history.    Family History:   History reviewed. No pertinent family  history.    Social History:     History     Social History   . Marital Status: Widowed     Spouse Name: N/A     Number of Children: N/A   . Years of Education: N/A     Social History Main Topics   . Smoking status: Never Smoker    . Smokeless tobacco: Not on file   . Alcohol Use: No   . Drug Use: No   . Sexually Active: Not on file     Other Topics Concern   . Not on file     Social History Narrative   . No narrative on file       Allergies:     Allergies   Allergen Reactions   . Azithromycin    . Dilaudid (Hydromorphone Hcl)    . Erythromycin    . Iodine      Iv contrast       Medications:      Home Medications   Prescriptions prior to admission   Medication Sig   . acetaminophen (TYLENOL) 500 MG  tablet Take 1,000 mg by mouth every 6 (six) hours as needed.   Marland Kitchen amLODIPine (NORVASC) 5 MG tablet Take 5 mg by mouth daily.   Marland Kitchen aspirin 81 MG tablet Take 81 mg by mouth daily.   . calcium-vitamin D (OSCAL-500) 500-200 MG-UNIT per tablet Take 1 tablet by mouth daily.   . cephALEXin (KEFLEX) 500 MG capsule Take 500 mg by mouth 4 (four) times daily.   . furosemide (LASIX) 20 MG tablet Take 20 mg by mouth daily.    Marland Kitchen gabapentin (NEURONTIN) 600 MG tablet Take 600 mg by mouth 2 (two) times daily.   Marland Kitchen glipiZIDE (GLUCOTROL) 5 MG tablet Take 5 mg by mouth 2 (two) times daily before meals.   . insulin aspart (NOVOLOG) 100 UNIT/ML injection Inject into the skin 3 (three) times daily before meals. Sliding scale   . insulin glargine (LANTUS) 100 UNIT/ML injection Inject 15 Units into the skin nightly.   . magnesium oxide (MAG-OX) 400 MG tablet Take 400 mg by mouth daily.   . naproxen (NAPROSYN) 500 MG tablet Take 500 mg by mouth 2 (two) times daily with meals.   . pantoprazole (PROTONIX) 40 MG tablet Take 40 mg by mouth daily.   . predniSONE (DELTASONE) 5 MG tablet Take 5 mg by mouth daily.   . sucralfate (CARAFATE) 1 G tablet Take 1 g by mouth daily.   . vitamin B-12 (CYANOCOBALAMIN) 500 MCG tablet Take 500 mcg by mouth daily.   .  cetirizine (ZYRTEC) 10 MG tablet Take 10 mg by mouth daily.   Marland Kitchen lisinopril (PRINIVIL,ZESTRIL) 2.5 MG tablet Take 1 tablet (2.5 mg total) by mouth daily.   . methotrexate (RHEUMATREX) 2.5 MG tablet Take 7.5 mg by mouth every mon, wed and fri.   . traMADol-acetaminophen (ULTRACET) 37.5-325 MG per tablet Take 1 tablet by mouth every 6 (six) hours as needed.              Current Medications   Current Facility-Administered Medications   Medication Dose Route Frequency   . [COMPLETED] acetaminophen  650 mg Oral Once   . amLODIPine  5 mg Oral Daily   . [COMPLETED] aspirin  324 mg Oral Once   . aspirin EC  81 mg Oral Daily   . enoxaparin  30 mg Subcutaneous QHS   . furosemide  20 mg Oral Daily   . gabapentin  600 mg Oral BID   . glipiZIDE  5 mg Oral BID AC   . insulin glargine  15 Units Subcutaneous QHS   . magnesium oxide  400 mg Oral Daily   . naproxen  250 mg Oral BID   . pantoprazole  40 mg Oral QAM AC   . predniSONE  5 mg Oral QAM W/BREAKFAST   . sodium chloride (PF)  3 mL Intravenous Q8H   . sucralfate  1 g Oral Daily   . vitamin B-12  500 mcg Oral Daily                            Review of Systems:   Comprehensive review of systems including constitutional, eyes, ears, nose, mouth, throat, cardiovascular, GI, GU, musculoskeletal, integumentary, respiratory, neurologic, psychiatric, and endocrine is negative other than what is mentioned already in the history of present illness    Physical Exam:     Filed Vitals:    07/15/12 0724   BP: 115/68   Pulse: 60   Temp: 96.6 F (35.9 C)  Resp: 20   SpO2: 100%     Temp (24hrs), Avg:96.2 F (35.7 C), Min:94.5 F (34.7 C), Max:97.3 F (36.3 C)      Intake and Output Summary (Last 24 hours) at Date Time  No intake or output data in the 24 hours ending 07/15/12 0856    GENERAL: Patient is in no acute distress   HEENT: No scleral icterus or conjunctival pallor, moist mucous membranes   NECK: No jugular venous distention or thyromegaly, normal carotid upstrokes without bruits    CARDIAC: Normal apical impulse, regular rate and rhythm, with normal S1 and S2, and no rubs, or gallops.  2/6 SEM  VASCULAR: 2+ carotid, radial, and distal pulses bilaterally  LUNGS: Clear to auscultation bilaterally, normal respiratory effort  ABDOMEN: No abdominal bruits, masses, or hepatosplenomegaly, nontender, non-distended, good bowel sounds   EXTREMITIES: No clubbing, cyanosis.  No LE edema.  Rheumatic changes.   SKIN: No rash or jaundice   NEUROLOGIC: Alert and oriented to time, place and person   PSYCH:  Normal mood and affect   MUSCULOSKELETAL: Grossly normal      Labs Reviewed:     CBC w/Diff     Lab 07/14/12 1731   WBC 7.21   HGB 9.4*   HCT 29.5*   PLT 238   NEUTROPCT --   MONOPCT --          Basic Metabolic Profile     Lab 07/14/12 1731   NA 139   K 4.2   CL 105   CO2 23   BUN 17   CREAT 1.1*   EGFR 55.9   GLU 298*   CA 8.5            Cardiac Enzymes     Lab 07/15/12 0745 07/15/12 0050 07/14/12 1731   CK -- -- 75   TROPI 0.02 <0.01 0.01   TROPT -- -- --   CKMBINDEX -- -- --          Thyroid Studies   No results found for this basename: TSH,FREET3,FREET4 in the last 168 hours       Cholesterol Panel   No results found for this basename: CHOL,TRIG,HDL,LDL in the last 168 hours       Coagulation Studies   No results found for this basename: PT:3,INR:3,PTT:3 in the last 168 hours         Imaging:     Independent review of EKG shows NSR, no ischemic ST T wave changes, PRWP, no sig change from prior

## 2012-07-15 NOTE — PT Eval Note (Cosign Needed)
[facility]  [address]  [place], [place] [address]  [phone]    Physical Therapy Evaluation    Patient: [name] MRN: [id number]   Unit: [facility] INTERMEDIATE CARE Bed: MI626/MI626-01    Time of Treatment: Time Calculation  PT Received On: 07/15/12  Start Time: 1045  Stop Time: 1110  Time Calculation (min): 25 min    Consult received for [name] for PT evaluation and treatment.  Patient's medical condition is appropriate for Physical Therapy  intervention at this time.    Medical Diagnosis: Diabetes [250.00]  Chest pain, atypical [786.59]  630160 Chest pain, [id number]     History of Present Illness: [name] is a 79 y.o. female admitted on  07/14/2012 with c/o cp since yesterday. Pt states she's had cp and back pain intermittently since yesterday. Denies cp now. States she vomited yesterday. No abd pain. No cp now. Pt c/o right neck pain. Pt also c/o sob.    Patient Active Problem List   Diagnosis   . Type II or unspecified type diabetes mellitus with neurological manifestations, not stated as uncontrolled(250.60)   . Atherosclerosis of native arteries of the extremities, unspecified   . Type II or unspecified type diabetes mellitus with neurological manifestations, not stated as uncontrolled(250.60)   . Chronic venous insufficiency   . CHF (congestive heart failure)   . HTN (hypertension)   . History of DVT of lower extremity   . GERD (gastroesophageal reflux disease)   . RA (rheumatoid arthritis)   . Varicose veins of lower extremities with ulcer   . Anemia   . Diverticulitis   . AKI (acute kidney injury)   . CKD (chronic kidney disease), stage III   . Chest pain     Past Medical History   Diagnosis Date   . Diabetes mellitus without complication    . Hypertensive disorder    . Arthritis      rheumatoid     History reviewed. No pertinent past surgical history.    Precautions: falls, contact precautions    X-Rays/Tests/Labs:  Results for Miranda, [name] (MRN [id number]) as  of 07/15/2012 11:28   Ref. Range 07/14/2012 17:31   Hemoglobin Latest Range: 12.0-16.0 g/dL 9.4 (L)   Hematocrit Latest Range: 37.0-47.0 % 29.5 (L)     Chest AP Portable (Order #[id number]) on 07/14/2012 - Imaging Information  IMPRESSION:   Subtle right basilar opacity which probably represent   subsegmental atelectasis or mild scarring; however, early infiltrate   cannot be entirely excluded. Correlation with symptoms and physical exam   findings recommended.    Social History:  Lives alone in an apartment.   Entry Steps: none Rails: n/a Inside steps: none Rails: n/a   Equipment at home: rollator, tub shower w/ tub bench and grab bars, elevated commode w/ grab bars, reacher, sock-aide   Prior Level of Function: household ambulator, pt has caregiver hrs/day 5x/week   Cognition: intact    Mobility/Locomotion: mod I w/ rollator    Feeding: independent    Grooming: independent    Bathing: assist required    Dressing: assist required    Toileting: independent    Subjective: Patient is agreeable to participation in the therapy session. Nursing clears patient for therapy.  Pain: 10/10 back pain in sitting, pt denies chest pain    Objective:   Patient is in bed with  Telemetry and Intravenous Access in place.    Observation of patient/vitals:  Filed Vitals:  07/15/12 0038 07/15/12 0441 07/15/12 0724 07/15/12 1124   BP: 126/59 125/65 115/68 170/75   Pulse:  61 60 65   Temp: 94.5 F (34.7 C) 96.1 F (35.6 C) 96.6 F (35.9 C) 95 F (35 C)   TempSrc: Oral Oral     Resp: 18 16 20 20    SpO2: 100% 100% 100% 98%     Orientation/Cognition:  Alert and Oriented x 4  Cognition: WFL    Musculoskeletal Examination:     ROM Strength   Neck/ Trunk Flattened lumbar lordosis, decreased thoracic kyphosis 3+/5   RLE WFL, DF to neutral 3+/5   LLE WFL, DF to neutral 3+/5     Sensation: Intact to light touch, localization BLE.  Pt c/o N/T bilat feet at baseline  Coordination: WFL    Functional Mobilty:  Rolling: NT    Supine to sit:  CGA  Scooting: min A  Sit to Supine: NT  Sit to stand: min A  Stand to sit: min A  Transfers: CGA  W/C Mobility: NT  Ambulation:     Weightbearing: FWB   Assistance level: CGA   Distance: 3 feet   Assistive Device: walker   Gait Deviations: forward flexed posture, decreased step length, slow cadence   Stairs: deferred    Balance:  Static Sit: good  Dynamic Sit: fair+  Static Stand: fair+  Dynamic Stand: fair    Endurance: fair+    Participation:  good    Education:  Educated the patient to role of physical therapy, plan of care, goals  of therapy and safety with mobility and ADLs.    Assessment:  [name] is a 79 y.o. female admitted 07/14/2012.  Pt's functional mobility is impaired due to the following deficits:  Decreased strength, impaired gait, balance, transfers, decreased activity tolerance, pain.  Pt would continue to benefit from PT to address these deficits and increase functional independence.     Rehabilitation Potential: good     Patient is in bedside chair with Telemetry and IV access , and call bell within reach. RN notified of session outcome.    G codes:  Mobility G Code Set  Mobility, Current Status [phone]): At least 40 percent but less than 60 percent impaired, limited or restricted  Mobility, Goal Status (X9371): At least 20 percent but less than 40 percent impaired, limited or restricted  Tools used to determine level of impairment:  (Clinical Judgement)    Patient's current impairment level is: Mobility, Current Status [phone]): CK    Patient is expected to achieve: Mobility, Goal Status (L3810): CJ    Patient discharge impairments level is:     Therapy Diagnosis: Impaired Mobility    Plan:   Risks/Benefits/POC Discussed with Pt/Family: With patient  Treatment/Interventions: Exercise;Gait training;Functional transfer training;LE strengthening/ROM;Endurance training;Bed mobility  PT Frequency: 2-3x/wk    Goals  Goal Formulation: With patient  Time for Goal Acheivement: 3 visits  Pt Will Go  Supine To Sit: Independently;to maximize functional mobility and independence  Pt Will Transfer Bed/Chair: Independently;to maximize functional mobility and independence  Pt Will Ambulate: 51-100 feet;with rolling walker;Independently;to maximize functional mobility and independence    D/C Suggestions:  Home w/ 24 hr supervision & HHPT vs SNF    Equipment Recommendation:  Pt has recommended equipment    Signature:  Prince Solian, PT  07/15/2012  11:27 AM  Phone: [phone]    Attention MD:   Thank you for allowing [place] to participate in the care of [facility]  Theophilus Bones. Regulations from the Center for Medicare and Medicaid Services (CMS) require your review and approval of this plan of care.     Please cosign this note indicating you are in agreement with the PT Plan of Care so we may initiate the therapy treatment plan

## 2012-07-15 NOTE — Progress Notes (Signed)
Severe Sepsis Screen    Date: 07/15/2012 Time: 1:41 PM  Nurse Signature: Essie Christine    Exclusions:      Patients meeting the following criteria are excluded from screening:     []  Suspicion or diagnosis of sepsis is documented and until 72 hours after antibiotics started or last changed:   - If Yes, Date of Documented Sepsis:                                          - If Yes, Date of last change in antibiotics:                                           []  Surgery - No screening for 24 hours after surgery   - If Yes, Date of Surgery:                                           []  Arctic Sun hypothermia protocol- Resume screening when arctic sun complete   []  Comfort Care/Palliative Care Orders- Do not resume screening    Did you check any of the boxes above?     [x]  No, Continue to section A   []  Yes, Stop Here, Patient Excluded from Sepsis Screening. If screening should resume in the future, place "sticky note to treatment team" with date/time of when screening should resume. Communicate patient excluded from screening due to Comfort Care/Palliative Care Orders using "sticky note to treatment team."    A. Infection:      Does your patient have ONE or more of the following infection criteria?     []  Documented Infection - Does the patient have positive culture results (from blood, sputum, urine, etc)?   []  Anti-Infective Therapy - Is the patient receiving antibiotic, antifungal, or other anti-infective therapy?   []  Pneumonia - Is there documentation of pneumonia (X Ray, etc)?   []  WBC's - Have WBC's been found in normally sterile fluid (urine, CSF, etc.)?   []  Perforated Viscus - Does the patient have a perforated hollow organ (bowel)?    A.  Did you check any of the boxes above?     [x]  No, Stop Here and Sepsis Screen Negative   []  Yes, continue to section B    B. SIRS:      Does your patient have TWO or more of the following SIRS criteria?     []  Temperature - Is the patient's temperature: Temp: 95  F (35 C) (07/15/12 1124)   - Greater than or equal to 38.3 degrees C (greater than 100.9 degrees F)?   - Less than or equal to 36 degrees C (less than or equal to 96.8 degrees F)?     []  Heart Rate: Heart Rate: 65  (07/15/12 1124)   - Is the patient's heart rate greater than or equal to 90 bpm?     []  Respiratory: Resp Rate: 20  (07/15/12 1124)   - Is the patient's respiratory rate greater than or equal to 20?     []  WBC Count - Is the patient's WBC count:   Lab 07/14/12 1731   WBC 7.21       -  Greater than or equal to 12,000/mm3 OR   - Less than or equal to 4,000/mm3 OR    - Are there greater than 10% immature neutrophils (bands)?     []  Glucose >140 without diabetes?   Lab 07/14/12 1731   GLU 298*         []  Significant edema is present?    B.  Did you check two or more of the boxes above?     [x]  No, Stop Here and Sepsis Screen Negative   []  Yes, contact Charge Nurse, continue to section C    C. ACUTE Organ Dysfunction:      Does your patient have ONE or more of the following organ dysfunction? (May need to wait for lab results for assessment - see below) Organ dysfunction must be a result of the sepsis NOT CHRONIC conditions.     []  Cardiovascular - Does the patient have a: BP: 170/75 mmHg (07/15/12 1124)   - Systolic Blood Pressure less than or equal to 90 mmHg OR   - Systolic Blood Pressure has dropped 40 mmHg or more from baseline OR   - Mean Arterial Pressure less than or equal to 70 mmHg (for at least one hour despite fluid resuscitation OR   - require vasopressor support?     []  Respiratory - Does the patient have new hypoxia defined by any of the following?   - A sustained increase in oxygen requirements by at least 2L/min on NC or 28% FiO2 within the last 24 hrs OR   - A persistent decrease in oxygen saturation of greater than or equal to 5% lasting at least four or more hours and occurring within the last 24 hours     []  Renal - Does the patient have:   - low urine output (e.g. Less than 0.5  mL/kg/HR for one hour despite adequate fluid resuscitation OR   - Increased creatinine (greater than 50% increase from baseline) OR   - require acute dialysis?     []  Hematologic - Does the patient have:   - Low platelet count (less than 100,000 mm3)   Lab 07/14/12 1731   PLT 238    OR   - INR/aPTT greater than upper limit of normal? No results found for this basename: INR:1 in the last 168 hours OR No results found for this basename: APTT:1 in the last 168 hours     []  Metabolic - Does the patient have a high lactate (plasma lactate greater than or equal to 2.4 mMol/L? No results found for this basename: LACTATE:5 in the last 168 hours     []  Hepatic - Are the patient's liver enzymes elevated (ALT greater than 72 IU/L or Total Bilirubin greater than 2 MG/dL)?   Lab 07/14/12 1731   BILITOTAL 0.4   ALT 29         []  CNS - Does the patient have altered consciousness or reduced Glasgow Coma Scale?     Other Active Diagnoses that may be contributing to signs of end organ dysfunction (Ex. Chronic kidney disease, cirrhosis):   - ________________________________________________________________      C.  Did you check any of the boxes above?     [x]  No, Sepsis Screen Negative   []  YES:  A) Infection + B) SIRS + C) Acute Organ Dysfunction = Positive Screen for Severe Sepsis. Notify attending (house officer during off-hours).     Notify Attending/House Technical sales engineer and document in Complex Assessment under provider notification   -  Name of physician notified:                                           - Date/Time Notifiied:                                             Document actions:   []  Lactate drawn   []  Blood Cultures obtained   []  Antibiotics initiated or modified   []   IV Fluid administered 0.9% NS __________ mLs given   Nursing Comments/Narrative:    - ______________________________________________________________     The Surviving Sepsis Guidelines recommend the following interventions to be completed within  one hour of a positive sepsis screen.   Obtain new blood cultures prior to antibiotic administration if not done within the last 24 hours.   Obtain lactate level, if initial lactate > 68mmol, repeat lactate in 2 hours for goal decrease 10-20%; If there is not a decrease call Doctor, hospital   If SBP < 90 or MAP < 65 or lactate greater than 4 mmol/dl; Start 0.9% NS IV Fluid Bolus of 30 mL/Kg (minimum) to maintain MAP > 65    Initiate vasopressors for hypotension not responding to fluid resuscitation (1st line Norepinephrine 1 - 300 mcg/min IV) - Patient must be in CCU if requires vasopressor   Initiate or escalate antibiotic therapy   Goal urine output greater than/equal to 0.5 mL/kg/hr

## 2012-07-15 NOTE — OT Eval Note (Cosign Needed)
[facility]  [address]  [place], [place] [address]  [phone]    Occupational Therapy Evaluation    Patient: [name] MRN: [id number]   Unit: [facility] INTERMEDIATE CARE Bed: MI626/MI626-01    Time of treatment: Time Calculation  OT Received On: 07/15/12  Start Time: 1110  Stop Time: 1135  Time Calculation (min): 25 min    Consult received for [name] for OT evaluation and treatment.  Patient's medical condition is appropriate for Occupational Therapy  intervention at this time.    Medical Diagnosis: Diabetes [250.00]  Chest pain, atypical [786.59]  [id number] Chest pain, [id number]    History of Present Illness: [name] is a 79 y.o. female admitted on  07/14/2012 with cp since yesterday. Pt states she's had cp and back pain intermittently since yesterday. Denies cp now. States she vomited yesterday. No abd pain. No cp now. Pt c/o right neck pain. Pt also c/o sob.      Patient Active Problem List   Diagnosis   . Type II or unspecified type diabetes mellitus with neurological manifestations, not stated as uncontrolled(250.60)   . Atherosclerosis of native arteries of the extremities, unspecified   . Type II or unspecified type diabetes mellitus with neurological manifestations, not stated as uncontrolled(250.60)   . Chronic venous insufficiency   . CHF (congestive heart failure)   . HTN (hypertension)   . History of DVT of lower extremity   . GERD (gastroesophageal reflux disease)   . RA (rheumatoid arthritis)   . Varicose veins of lower extremities with ulcer   . Anemia   . Diverticulitis   . AKI (acute kidney injury)   . CKD (chronic kidney disease), stage III   . Chest pain     Past Medical History   Diagnosis Date   . Diabetes mellitus without complication    . Hypertensive disorder    . Arthritis      rheumatoid     History reviewed. No pertinent past surgical history.    Precautions: falls, contact isolation    X-Rays/Tests/Labs:  Results for VUNG, KUSH (MRN [id number])  as of 07/15/2012 11:38   Ref. Range 07/14/2012 17:31   Hemoglobin Latest Range: 12.0-16.0 g/dL 9.4 (L)   Hematocrit Latest Range: 37.0-47.0 % 29.5 (L)     Social History:  Lives alone in a senior apartment [facility]).  Caregiver 5 days a week.  Entry Steps: none Rails: na Inside steps: none Rails: na   Equipment at home: Rollator, tub shower bench and grab bars for tub, raised toilet, grab bars by toilet, reacher, sock aid   Prior Level of Function: Caregiver assists with bathing and dressing. Pt gets meals on wheels.   Cognition: intact   Mobility: modified independent with use of Rollator   Feeding: independent   Grooming: modified independent; sits down   Bathing: assistance from caregiver  Dressing: assist with LB from caregiver   Toileting: modified independent     Subjective: Patient is agreeable to participation in the therapy session. Nursing clears patient for therapy.  Pain: 10/10 low back pain with movement, 0/10 at rest.  Pt with c/o of R neck pain that increases with touch.    Objective:  Patient is in bed with hot pack, Telemetry and Intravenous Access in place.  R side of neck pulsating.      Observation of patient/vitals:    Filed Vitals:    07/15/12 0038 07/15/12 0441 07/15/12 0724 07/15/12 1124  BP: 126/59 125/65 115/68 170/75   Pulse:  61 60 65   Temp: 94.5 F (34.7 C) 96.1 F (35.6 C) 96.6 F (35.9 C) 95 F (35 C)   TempSrc: Oral Oral     Resp: 18 16 20 20    Height:       Weight:       SpO2: 100% 100% 100% 98%       Orientation/Cognition:  Alert and Oriented x 4  Cognition: WFL    Musculoskeletal Examination:     ROM Strength   RUE WFL Grossly 4-/5   LUE WFL Grossly 4-/5     Sensation: Pt states she has numbness in B hands when she wakes up in the morning  Coordination: decreased B hands with arthritic deformities  Vision: WFL  Hearing: WFL    Functional Mobilty:  Rolling: NT    Scooting: min assist  Supine to sit: CGA  Sit to Supine: NT  Sit to stand: mod assist of 2  Stand to sit:  min assist of 2  Transfers: min assist of 2 with SW    Balance:  Static Sit Balance: good  Dynamic Sit Balance: fair  Static Stand Balance: fair  Dynamic Stand Balance: fair-    Self Care:  Eating: set up  Grooming: min assist  Bathing: mod assist  UB Dressing: min assist  LB Dressing: dependent  Toileting: max assist    Endurance: fair, limited by pain    Participation:  good    Education:  Educated the patient to role of occupational therapy, plan of care, goals  of therapy and safety with mobility and ADLs.    Assessment:  [name] is a very pleasant and cooperative 79 y.o. female admitted 07/14/2012.  Pt's ability to complete ADLs and functional transfers is impaired due to the following deficits:  Back and R neck pain, decreased activity tolerance, decreased strength, decreased balance.  Pt would continue to benefit from OT to address these deficits and increase independence with ADLs and functional transfers.     Rehabilitation Potential:   Good     Patient is in bedside chair with friend, Darel Hong, at bedside, hot pack on back and neck and call bell within reach. RN notified of session outcome.    G codes:       AM-PACT "6 Clicks" Daily Activity Inpatient Short Form  Inpatient AM-PACT Performed?: yes  Put On/Take Off Lower Body Clothing: Total  Assist with Bathing: A lot  Assist with Toileting: Total  Put On/Take Off Upper Body Clothing: A little  Assist with Grooming: None  Assist with Eating: None  OT Daily Activity Raw Score: 15   CMS 0-100% Score: 56.46%   Function-Related G Codes  Function Related G Code: Self Care           Self Care G Code Set  Self Care, Current Status (Z6109): At least 40 percent but less than 60 percent impaired, limited or restricted  Self Care, Goal Status (U0454): At least 20 percent but less than 40 percent impaired, limited or restricted  Tools used to determine level of impairment: AM-PACT          Patient's current impairment level is: Self Care, Current Status (U9811): CK    Patient is expected to achieve: Self Care, Goal Status (B1478): CJ     Therapy Diagnosis: decline in self care fn  Rehabilitation Potential: good    Goals:  Goal Formulation: Patient  Time For Goal Achievement:  5 visits  Patient will dress upper body: with supervision  Patient will dress lower body: with moderate assist  Pt will complete bathing: With minimal assist  Patient will toilet: with supervision  Pt will make functional transfers: with supervision    OT Plan  Treatment Interventions: ADL retraining;Functional transfer training;UE strengthening/ROM;Endurance training;Patient/Family training;Equipment eval/education  Discharge Recommendation: SNF  OT Frequency Recommended: 2-3x/wk    D/C Suggestions:  SNF    Signature:   Rosalia Hammers, OT  07/15/2012  11:36 AM  Phone: [phone]    Attention MD:   Thank you for allowing [place] to participate in the care of [name]. Regulations from the [employer] for Medicare and Medicaid Services (CMS) require your review and approval of this plan of care.     Please cosign this note indicating you are in agreement with the OT Plan of Care so we may initiate the therapy treatment plan

## 2012-07-15 NOTE — Plan of Care (Signed)
Pt received form the ED on a stretcher. Pt oriented to room and call bell system, verbalizes understanding. Pt A&O X 4, sinus rhythm on the monitor, VSS.c/o of 3/10  pain to the neck, heating pad applied to the neck. No acute distress noted, pt able to make needs known, call bell  within reach, all other safety precautions maintained, will continue with current plan of care

## 2012-07-15 NOTE — Progress Notes (Signed)
Pt. Axox3. VSS. No chest pain.  Pt still has pain in her back.  Advised to follow up with PCP if back pain continues.  No changes in meds.  Pt will follow up with cardiologist and with her own PCP.  IV and tele removed.  Pt taken to lobby.

## 2012-07-15 NOTE — Progress Notes (Incomplete)
UA negative for nitrites and leukocytes.  Dr. Wilhelmina Mcardle aware and gentomycin ordered to not be given.

## 2012-07-16 LAB — ECG 12-LEAD
Atrial Rate: 77 {beats}/min
Atrial Rate: 63 {beats}/min
P Axis: 52 degrees
P Axis: 47 degrees
P-R Interval: 156 ms
P-R Interval: 168 ms
Q-T Interval: 394 ms
Q-T Interval: 414 ms
QRS Duration: 94 ms
QRS Duration: 90 ms
QTC Calculation (Bezet): 445 ms
QTC Calculation (Bezet): 423 ms
R Axis: -6 degrees
R Axis: -14 degrees
T Axis: 53 degrees
T Axis: 15 degrees
Ventricular Rate: 77 {beats}/min
Ventricular Rate: 63 {beats}/min

## 2012-07-17 ENCOUNTER — Ambulatory Visit
Admission: RE | Admit: 2012-07-17 | Discharge: 2012-07-17 | Disposition: A | Discharge status: Home / Self Care | Payer: Medicare Other | Source: Ambulatory Visit | Attending: Internal Medicine | Admitting: Internal Medicine

## 2012-07-17 ENCOUNTER — Other Ambulatory Visit: Payer: Self-pay | Admitting: Internal Medicine

## 2012-07-17 DIAGNOSIS — M25559 Pain in unspecified hip: Secondary | ICD-10-CM | POA: Insufficient documentation

## 2012-07-17 DIAGNOSIS — M549 Dorsalgia, unspecified: Secondary | ICD-10-CM | POA: Insufficient documentation

## 2012-12-10 ENCOUNTER — Other Ambulatory Visit: Payer: Self-pay | Admitting: Anesthesiology

## 2012-12-10 NOTE — Discharge Instructions (Signed)
Patient Identification  [name]    79 y.o. female.  DOB:  1916/04/15      PAIN MANAGEMENT DISCHARGE INSTRUCTIONS  You have just experienced an outpatient procedure. We hope our visit was a pleasant one. Since you are continuing your recovery in the comfort of your own home, here are some instructions to assist you.    FOLLOW THE ITEMS BELOW:    You are advised to go directly home from the hospital, restrict your activities and rest for the remainder of the day.    Resume light activity as tolerated tomorrow. AVOID pulling , pushing, twisting, or carrying anything greater than 10 pounds.    Consult with your physician for more information regarding progression of activity.    Resume your regular diet.    MEDICATIONS  For mild discomfort, you may take Tylenol (two every four hours) or any non-prescription pain medication that you normally use, preferably one that does not contain aspirin.    Prescription provided, use as directed. When taking pain medications, you may experience dizziness or drowsiness. Do not drink alcohol or drive when you are taking these medications.    Resume your daily prescription medication schedule as directed by your Physician.     If you take Plavix, you may start tomorrow. If you take Coumadin, you may start it right away.    Other: ________________________________________________________    SPECIAL INSTRUCTIONS:  Observe the injection site/dressing. There should be no bleeding or swelling of the site. The injection site may be tender for a few days.     To minimize this, apply ice packs 10-15 minutes three times per day X 48 hours. No hot soaking baths for 48 hours. Quick warm shower is OK.     Discomfort may increase for 1-2 days following your pain block. Continue to take your pain medications as directed.          If you experience any of the following symptoms:   -   bleeding or swelling at injected site   -  increased numbness, weakness or severe pain   -  loss of bowel or  bladder control   -  headache which is unusually severe or lasts more than two days   -   other unexplained symptoms, problems, or concerns    Call the Pain Clinic at [phone] Monday through Friday 8 a.m to 4:30 pm.  At other times call [phone] and ask for the anesthesiologist on-call, or report to the Emergency Room.     FOLLOW UP CARE:  The nurse will call you for follow up in 1 week to 10 days.    Please call [facility] for your next appointment at telephone number [phone].     I have read the above instructions and agree to follow them:    ____________________________________  ___8-21-14________________  Patient Signature     Date       ____________________________________  ___8-21-14________________  Discharge RN      Date

## 2012-12-18 ENCOUNTER — Ambulatory Visit
Admission: RE | Admit: 2012-12-18 | Discharge: 2012-12-18 | Disposition: A | Discharge status: Home / Self Care | Payer: Medicare Other | Source: Ambulatory Visit | Attending: Anesthesiology | Admitting: Anesthesiology

## 2012-12-18 ENCOUNTER — Encounter
Admission: RE | Disposition: A | Discharge status: Home / Self Care | Payer: Self-pay | Source: Ambulatory Visit | Attending: Anesthesiology

## 2012-12-18 ENCOUNTER — Encounter: Payer: Self-pay | Admitting: Anesthesiology

## 2012-12-18 ENCOUNTER — Ambulatory Visit: Payer: Medicare Other | Admitting: Anesthesiology

## 2012-12-18 DIAGNOSIS — E119 Type 2 diabetes mellitus without complications: Secondary | ICD-10-CM | POA: Insufficient documentation

## 2012-12-18 DIAGNOSIS — I10 Essential (primary) hypertension: Secondary | ICD-10-CM | POA: Insufficient documentation

## 2012-12-18 DIAGNOSIS — IMO0002 Reserved for concepts with insufficient information to code with codable children: Secondary | ICD-10-CM | POA: Insufficient documentation

## 2012-12-18 DIAGNOSIS — M48061 Spinal stenosis, lumbar region without neurogenic claudication: Secondary | ICD-10-CM | POA: Insufficient documentation

## 2012-12-18 DIAGNOSIS — Z794 Long term (current) use of insulin: Secondary | ICD-10-CM | POA: Insufficient documentation

## 2012-12-18 HISTORY — PX: BLOCK, EPIDURAL STEROID / LUMB-SAC: SHX7504

## 2012-12-18 SURGERY — MV PAIN CLINIC CONSULT ONLY

## 2012-12-18 MED ORDER — BUPIVACAINE HCL (PF) 0.25 % IJ SOLN
INTRAMUSCULAR | Status: DC | PRN
Start: 2012-12-18 — End: 2012-12-18
  Administered 2012-12-18: 3 mL via EPIDURAL

## 2012-12-18 MED ORDER — LIDOCAINE HCL (PF) 1 % IJ SOLN
INTRAMUSCULAR | Status: DC | PRN
Start: 2012-12-18 — End: 2012-12-18
  Administered 2012-12-18: 3 mL via INTRAMUSCULAR

## 2012-12-18 MED ORDER — METHYLPREDNISOLONE ACETATE 40 MG/ML IJ SUSP
INTRAMUSCULAR | Status: AC
Start: 2012-12-18 — End: ?
  Filled 2012-12-18: qty 2

## 2012-12-18 NOTE — Op Note (Signed)
Procedure Date: 12/18/2012     Patient Type: A     SURGEON: Caro Laroche MD  ASSISTANT:       PREOPERATIVE DIAGNOSES:  1.  Chronic low back pain.  2.  Left lumbar radiculopathy.  3.  Lumbar radiculopathy, mostly on the right.     POSTOPERATIVE DIAGNOSES:  1.  Chronic low back pain.  2.  Lumbar spine spondylosis with stenosis.  3.  Lumbar radiculopathy, mostly on the right.     TITLE OF PROCEDURE:  Epidural steroid injection at L4-5 under fluoroscopic guidance.     INDICATION FOR PROCEDURE:  This is a 79 year old lady with longstanding history of low back pain and  bilateral lower extremity pain, mostly on the right.  This is associated  with numbness and tingling.  There is weakness in both lower extremities.   An MRI of the lumbar spine from 2012 showed disk bulge and facet joint  hypertrophy resulting in severe central canal and neural foraminal stenosis  at L2-L3, L3-L4, and L4-L5.  The patient's pain has persisted in spite of  conservative management.  This procedure is done today for diagnostic and  therapeutic purposes in the hope of providing the patient with some pain  relief and facilitating her daily activities and home exercises.     DESCRIPTION OF PROCEDURE:  After obtaining informed consent, explaining to the patient about the  risks, benefits, and alternatives of procedure including but not limited to  bleeding, infection, allergic reaction, post-dural puncture headache, nerve  injury, and failure of the block at relieving her pain, the patient was  taken to the procedure room and placed in the prone position on the  fluoroscopic table.  She was then connected to the monitors.  The lumbar  area was prepped and draped.  The skin and subcutaneous tissues overlying  the L4-L5 interspace were anesthetized using 5 mL of 1% lidocaine.  The  epidural space was then accessed using an 18-gauge Tuohy needle through the  loss of resistance technique under fluoroscopic guidance.  After negative  aspiration, 2.5 mg  bupivacaine and 80 mg methylprednisolone were injected  through the needle.  There was some pressure paresthesia felt on injection  down the legs.  This resolved promptly.  The needle was withdrawn intact.   The patient tolerated the procedure well and had some decrease in her pain  level.     DISPOSITION:  After the appropriate recovery time, the patient was discharged home in a  stable condition.  She was encouraged to continue her home exercises.  She  will be contacted next week for reassessment and consideration of other  pain management therapies if needed.           D:  12/18/2012 13:51 PM by Dr. Caro Laroche, MD ([id number])  T:  12/18/2012 20:44 PM by       Everlean Cherry: [id number]) (Doc ID: [id number])

## 2012-12-18 NOTE — Consults (Signed)
Service Date: 12/18/2012     Patient Type: A     CONSULTING PHYSICIAN: Caro Laroche MD     REFERRING PHYSICIAN: Tamera Reason MD     HISTORY OF PRESENT ILLNESS:  Dear. Dr. Mare Ferrari,     Thank you for referring [name] to me for pain management.  As you  recall, this is a 79 year old lady with longstanding history of low back  pain and bilateral lower extremity pain, mostly on the right.  Her pain  radiates down the lateral aspect of her leg to the calf.  It continues to  her toes.  The toes of both feet are numb.  She rates her pain at 10/10 on  a visual analog scale on her presentation to me today and that is mostly  her back pain.  Her pain is constant.  It is associated with weakness and  the patient uses a walker to ambulate.  Movement in general triggered her  pain.  Taking a nap or sleeping at night relieves some of her pain.  She  denied any recent changes in her bowel or bladder function.  She also takes  Tylenol to relieve her pain.  She has not had physical therapy as she  remembers.     PAST MEDICAL HISTORY:  Significant for hypertension and diabetes for the past 15 or 20 years.     PAST SURGICAL HISTORY:  Includes right total hip arthroplasty and fixation of a left hip fracture,  hysterectomy and eye surgery.     ALLERGIES:  The patient lists allergies to ERYTHROMYCIN, AZITHROMYCIN, IODINE, and  HYDROMORPHONE.     MEDICATIONS:  Consists of Tylenol 500 mg, at the time Norvasc, baby aspirin, Zyrtec,  Lasix, Neurontin, Glucotrol, Lantus insulin, lisinopril, magnesium oxide,  methotrexate, Naprosyn, Percocet as needed, Protonix, prednisone 5 mg a  day, Carafate, Ultracet, vitamin B12, and calcium and vitamin D.     FAMILY HISTORY:  Noncontributory.     SOCIAL HISTORY:  The patient is retired.  She does not smoke cigarettes or drink alcohol.     REVIEW OF SYSTEMS:  A 12-point review of systems is significant for pain in the lower back and  both legs, mostly on the right with numbness and tingling in both feet  and  weakness.     RADIOGRAPHIC FINDINGS:  An MRI of the lumbar spine dated 2012 showed disk bulge and facet joint  hypertrophy resulting in severe central canal and neural foraminal  narrowings at L2-L3, L3-L4, and L4-L5.  X-rays of both hips done in 2012  showed good positioning of the joint prosthesis.     PHYSICAL EXAMINATION:  The patient is 5 feet 9 inches tall and weighs 128 pounds.  She is afebrile  and her vital signs are stable.  She is sitting comfortably in a chair.   She has trigger point tenderness over the right lumbar paraspinal muscle  area.  Deep tendon reflexes are decreased at 1+ at the ankles and knees.   There is global weakness at 4/5 in both lower extremities.  Sensory exam on  the lower extremities is within normal limits.  Straight leg raising sign  was negative bilaterally except for causing low back pain.  Fabere sign was  negative.     ASSESSMENT:  1.  Chronic low back pain.  2.  Severe lumbar spine stenosis.  3.  Lumbar radiculopathy, most prominently on the right.  4.  History of bilateral hip arthroplasties.  PLAN:  Given the patient's history, physical, MRI findings, and the fact that her  pain has persisted in spite of conservative management, she is now a  candidate to receive interventional pain management procedures.  I  performed an epidural steroid injection at L4-L5 under fluoroscopic  guidance for diagnostic and therapeutic purposes.  The patient tolerated  the procedure well and had some decrease in her pain level.  I encouraged  the patient to be active as tolerated.  She will be contacted next week for  reassessment and consideration of other pain management therapies if  needed.     I thank you again, Dr. Mare Ferrari for allowing me to participate in the care of  this fine lady.           D:  12/18/2012 14:15 PM by Dr. Caro Laroche, MD ([id number])  T:  12/18/2012 15:07 PM by [id number]      [name]: [id number]) (Doc ID: [id number])

## 2012-12-26 ENCOUNTER — Other Ambulatory Visit: Payer: Self-pay | Admitting: Anesthesiology

## 2012-12-26 NOTE — Discharge Instructions (Signed)
Patient Identification  [name]    79 y.o. female.  DOB:  Feb 19, 1916      PAIN MANAGEMENT DISCHARGE INSTRUCTIONS  You have just experienced an outpatient procedure. We hope our visit was a pleasant one. Since you are continuing your recovery in the comfort of your own home, here are some instructions to assist you.    FOLLOW THE ITEMS BELOW:    You are advised to go directly home from the hospital, restrict your activities and rest for the remainder of the day.    Resume light activity as tolerated tomorrow. AVOID pulling , pushing, twisting, or carrying anything greater than 10 pounds.    Consult with your physician for more information regarding progression of activity.    Resume your regular diet.    MEDICATIONS  For mild discomfort, you may take Tylenol (two every four hours) or any non-prescription pain medication that you normally use, preferably one that does not contain aspirin.    Prescription provided, use as directed. When taking pain medications, you may experience dizziness or drowsiness. Do not drink alcohol or drive when you are taking these medications.    Resume your daily prescription medication schedule as directed by your Physician.     If you take Plavix, you may start tomorrow. If you take Coumadin, you may start it right away.    Other: ________________________________________________________    SPECIAL INSTRUCTIONS:  Observe the injection site/dressing. There should be no bleeding or swelling of the site. The injection site may be tender for a few days.     To minimize this, apply ice packs 10-15 minutes three times per day X 48 hours. No hot soaking baths for 48 hours. Quick warm shower is OK.     Discomfort may increase for 1-2 days following your pain block. Continue to take your pain medications as directed.          If you experience any of the following symptoms:   -   bleeding or swelling at injected site   -  increased numbness, weakness or severe pain   -  loss of bowel or  bladder control   -  headache which is unusually severe or lasts more than two days   -   other unexplained symptoms, problems, or concerns    Call the Pain Clinic at [phone] Monday through Friday 8 a.m to 4:30 pm.  At other times call [phone] and ask for the anesthesiologist on-call, or report to the Emergency Room.     FOLLOW UP CARE:  The nurse will call you for follow up in 1 week to 10 days.    Please call [facility] for your next appointment at telephone number [phone].     I have read the above instructions and agree to follow them:    ____________________________________  __9-4-14_________________  Patient Signature     Date       ____________________________________  __9-4-14_________________  Discharge RN      Date

## 2013-01-01 ENCOUNTER — Ambulatory Visit
Admission: RE | Admit: 2013-01-01 | Discharge: 2013-01-01 | Disposition: A | Discharge status: Home / Self Care | Payer: Medicare Other | Source: Ambulatory Visit | Attending: Anesthesiology | Admitting: Anesthesiology

## 2013-01-01 ENCOUNTER — Ambulatory Visit: Payer: Medicare Other | Admitting: Anesthesiology

## 2013-01-01 ENCOUNTER — Encounter: Payer: Self-pay | Admitting: Anesthesiology

## 2013-01-01 ENCOUNTER — Encounter
Admission: RE | Disposition: A | Discharge status: Home / Self Care | Payer: Self-pay | Source: Ambulatory Visit | Attending: Anesthesiology

## 2013-01-01 DIAGNOSIS — R29898 Other symptoms and signs involving the musculoskeletal system: Secondary | ICD-10-CM | POA: Insufficient documentation

## 2013-01-01 DIAGNOSIS — M129 Arthropathy, unspecified: Secondary | ICD-10-CM | POA: Insufficient documentation

## 2013-01-01 DIAGNOSIS — M48061 Spinal stenosis, lumbar region without neurogenic claudication: Secondary | ICD-10-CM | POA: Insufficient documentation

## 2013-01-01 DIAGNOSIS — R209 Unspecified disturbances of skin sensation: Secondary | ICD-10-CM | POA: Insufficient documentation

## 2013-01-01 HISTORY — PX: BLOCK, EPIDURAL STEROID / LUMB-SAC: SHX7504

## 2013-01-01 SURGERY — BLOCK, INJECTION, STEROID, SPINE, LUMBAR OR SACRAL, EPIDURAL
Anesthesia: Local

## 2013-01-01 MED ORDER — BUPIVACAINE HCL (PF) 0.25 % IJ SOLN
INTRAMUSCULAR | Status: DC | PRN
Start: 2013-01-01 — End: 2013-01-01
  Administered 2013-01-01: 3 mL via EPIDURAL

## 2013-01-01 MED ORDER — METHYLPREDNISOLONE ACETATE 40 MG/ML IJ SUSP
INTRAMUSCULAR | Status: AC
Start: 2013-01-01 — End: ?
  Filled 2013-01-01: qty 2

## 2013-01-01 MED ORDER — LIDOCAINE HCL (PF) 1 % IJ SOLN
INTRAMUSCULAR | Status: DC | PRN
Start: 2013-01-01 — End: 2013-01-01
  Administered 2013-01-01: 3 mL via INTRAMUSCULAR

## 2013-01-01 NOTE — Op Note (Signed)
Procedure Date: 01/01/2013     Patient Type: A     SURGEON: Caro Laroche MD  ASSISTANT:       PREOPERATIVE DIAGNOSIS:   1.  Chronic low back pain.  2.  Lumbar spine spondylosis.  3.  Severe lumbar spine stenosis.  4.  Lumbar radiculopathy, mostly on the left at this time.     POSTOPERATIVE DIAGNOSES:  1.  Chronic low back pain.  2.  Lumbar spine spondylosis.  3.  Severe lumbar spine stenosis.  4.  Lumbar radiculopathy, mostly on the left at this time.     TITLE OF PROCEDURE:     Epidural steroid injection at the L4-L5 under fluoroscopy guidance.     INDICATION FOR PROCEDURE:  This is an almost 79 year old lady with longstanding history of low back  pain and bilateral lower extremity pain that used to be on the right side,  but is now on the left.  This is associated with numbness, tingling, and  some weakness in both lower extremities.  An MRI of the lumbar spine dated  2012 showed disk bulge and facet joint hypertrophy resulting in severe  central canal and neural foraminal stenosis at L4-L5, L3-L4, and L2-L3.   The patient had some pain relief from an epidural steroid injection that  was done 2 weeks ago.  This improved her right leg pain.  Currently, her  left leg pain is more prominent.  This procedure is done today in the hope  of providing the patient with more pain relief, mostly on the left, and  facilitating her daily activities and home exercises.     DESCRIPTION OF PROCEDURE:  After obtaining informed consent, the patient was taken to the procedure  room and placed in the prone position on the fluoroscopic table.  She was  then connected to the monitors.  The lumbar area was prepped and draped.   The skin and subcutaneous tissues overlying the L4-L5 interspace were  anesthetized using 5 mL of 1% lidocaine.  The epidural space was then  accessed using an 18-gauge Tuohy needle through the loss of resistance  technique under fluoroscopic guidance.  I used the left paramedian approach  to the epidural space.   After negative aspiration, 2.5 mg bupivacaine and  80 mg methylprednisolone were injected through the needle.  There was  definitely some pressure paresthesia felt on injection down legs.  This  resolved promptly.  The needle was withdrawn intact.  The patient tolerated  the procedure well and had some decrease in her pain level.     DISPOSITION:  After the appropriate recovery time, the patient was discharged home in a  stable condition.  She was encouraged to continue her exercises.  She will  be contacted next week for reassessment and consideration of other pain  management therapies if needed.           D:  01/01/2013 11:41 AM by Dr. Caro Laroche, MD ([id number])  T:  01/01/2013 20:21 PM by       Everlean Cherry: [id number]) (Doc ID: [id number])

## 2013-01-07 ENCOUNTER — Inpatient Hospital Stay
Admission: EM | Admit: 2013-01-07 | Discharge: 2013-01-16 | DRG: 640 | Disposition: A | Discharge status: Skilled Nursing Facility | Payer: Medicare Other | Attending: Family Medicine | Admitting: Family Medicine

## 2013-01-07 ENCOUNTER — Emergency Department: Payer: Medicare Other

## 2013-01-07 ENCOUNTER — Inpatient Hospital Stay: Payer: Medicare Other | Admitting: Internal Medicine

## 2013-01-07 DIAGNOSIS — E559 Vitamin D deficiency, unspecified: Secondary | ICD-10-CM | POA: Diagnosis present

## 2013-01-07 DIAGNOSIS — R627 Adult failure to thrive: Secondary | ICD-10-CM | POA: Diagnosis present

## 2013-01-07 DIAGNOSIS — G9341 Metabolic encephalopathy: Secondary | ICD-10-CM | POA: Diagnosis not present

## 2013-01-07 DIAGNOSIS — R29810 Facial weakness: Secondary | ICD-10-CM | POA: Diagnosis present

## 2013-01-07 DIAGNOSIS — R29898 Other symptoms and signs involving the musculoskeletal system: Secondary | ICD-10-CM | POA: Diagnosis present

## 2013-01-07 DIAGNOSIS — M6250 Muscle wasting and atrophy, not elsewhere classified, unspecified site: Secondary | ICD-10-CM | POA: Diagnosis present

## 2013-01-07 DIAGNOSIS — I509 Heart failure, unspecified: Secondary | ICD-10-CM | POA: Diagnosis present

## 2013-01-07 DIAGNOSIS — F0390 Unspecified dementia without behavioral disturbance: Secondary | ICD-10-CM | POA: Diagnosis present

## 2013-01-07 DIAGNOSIS — K219 Gastro-esophageal reflux disease without esophagitis: Secondary | ICD-10-CM | POA: Diagnosis present

## 2013-01-07 DIAGNOSIS — R634 Abnormal weight loss: Secondary | ICD-10-CM | POA: Diagnosis present

## 2013-01-07 DIAGNOSIS — R630 Anorexia: Secondary | ICD-10-CM | POA: Diagnosis present

## 2013-01-07 DIAGNOSIS — M545 Low back pain, unspecified: Secondary | ICD-10-CM | POA: Diagnosis present

## 2013-01-07 DIAGNOSIS — IMO0002 Reserved for concepts with insufficient information to code with codable children: Secondary | ICD-10-CM | POA: Diagnosis present

## 2013-01-07 DIAGNOSIS — Z794 Long term (current) use of insulin: Secondary | ICD-10-CM

## 2013-01-07 DIAGNOSIS — D649 Anemia, unspecified: Secondary | ICD-10-CM | POA: Diagnosis present

## 2013-01-07 DIAGNOSIS — I129 Hypertensive chronic kidney disease with stage 1 through stage 4 chronic kidney disease, or unspecified chronic kidney disease: Secondary | ICD-10-CM | POA: Diagnosis present

## 2013-01-07 DIAGNOSIS — D72829 Elevated white blood cell count, unspecified: Secondary | ICD-10-CM | POA: Diagnosis present

## 2013-01-07 DIAGNOSIS — M8448XA Pathological fracture, other site, initial encounter for fracture: Secondary | ICD-10-CM | POA: Diagnosis present

## 2013-01-07 DIAGNOSIS — I359 Nonrheumatic aortic valve disorder, unspecified: Secondary | ICD-10-CM | POA: Diagnosis present

## 2013-01-07 DIAGNOSIS — N179 Acute kidney failure, unspecified: Secondary | ICD-10-CM | POA: Diagnosis not present

## 2013-01-07 DIAGNOSIS — M48061 Spinal stenosis, lumbar region without neurogenic claudication: Secondary | ICD-10-CM | POA: Diagnosis present

## 2013-01-07 DIAGNOSIS — Z515 Encounter for palliative care: Secondary | ICD-10-CM

## 2013-01-07 DIAGNOSIS — N183 Chronic kidney disease, stage 3 unspecified: Secondary | ICD-10-CM | POA: Diagnosis present

## 2013-01-07 DIAGNOSIS — Z86718 Personal history of other venous thrombosis and embolism: Secondary | ICD-10-CM

## 2013-01-07 DIAGNOSIS — Z66 Do not resuscitate: Secondary | ICD-10-CM | POA: Diagnosis present

## 2013-01-07 DIAGNOSIS — K5900 Constipation, unspecified: Secondary | ICD-10-CM | POA: Diagnosis not present

## 2013-01-07 DIAGNOSIS — R4789 Other speech disturbances: Secondary | ICD-10-CM | POA: Diagnosis present

## 2013-01-07 DIAGNOSIS — M069 Rheumatoid arthritis, unspecified: Secondary | ICD-10-CM | POA: Diagnosis present

## 2013-01-07 DIAGNOSIS — Z96649 Presence of unspecified artificial hip joint: Secondary | ICD-10-CM

## 2013-01-07 DIAGNOSIS — J309 Allergic rhinitis, unspecified: Secondary | ICD-10-CM | POA: Diagnosis present

## 2013-01-07 DIAGNOSIS — Z681 Body mass index (BMI) 19 or less, adult: Secondary | ICD-10-CM

## 2013-01-07 DIAGNOSIS — Z90710 Acquired absence of both cervix and uterus: Secondary | ICD-10-CM

## 2013-01-07 DIAGNOSIS — K5289 Other specified noninfective gastroenteritis and colitis: Secondary | ICD-10-CM | POA: Diagnosis present

## 2013-01-07 DIAGNOSIS — Z883 Allergy status to other anti-infective agents status: Secondary | ICD-10-CM

## 2013-01-07 DIAGNOSIS — G8929 Other chronic pain: Secondary | ICD-10-CM | POA: Diagnosis present

## 2013-01-07 DIAGNOSIS — E871 Hypo-osmolality and hyponatremia: Principal | ICD-10-CM | POA: Diagnosis present

## 2013-01-07 LAB — CBC AND DIFFERENTIAL
Basophils Absolute Automated: 0.05 (ref 0.00–0.20)
Basophils Automated: 1 %
Eosinophils Absolute Automated: 0.04 (ref 0.00–0.70)
Eosinophils Automated: 0 %
Hematocrit: 35.5 % — ABNORMAL LOW (ref 37.0–47.0)
Hgb: 11.7 g/dL — ABNORMAL LOW (ref 12.0–16.0)
Lymphocytes Absolute Automated: 1.26 (ref 0.50–4.40)
Lymphocytes Automated: 15 %
MCH: 30.5 pg (ref 28.0–32.0)
MCHC: 33 g/dL (ref 32.0–36.0)
MCV: 92.4 fL (ref 80.0–100.0)
MPV: 10.8 fL (ref 9.4–12.3)
Monocytes Absolute Automated: 0.84 (ref 0.00–1.20)
Monocytes: 10 %
Neutrophils Absolute: 6.33 (ref 1.80–8.10)
Neutrophils: 74 %
Platelets: 216 (ref 140–400)
RBC: 3.84 — ABNORMAL LOW (ref 4.20–5.40)
RDW: 15 % (ref 12–15)
WBC: 8.52 (ref 3.50–10.80)

## 2013-01-07 LAB — COMPREHENSIVE METABOLIC PANEL
ALT: 26 U/L (ref 0–55)
AST (SGOT): 21 U/L (ref 5–34)
Albumin/Globulin Ratio: 0.7 — ABNORMAL LOW (ref 0.9–2.2)
Albumin: 3.2 g/dL — ABNORMAL LOW (ref 3.5–5.0)
Alkaline Phosphatase: 244 U/L — ABNORMAL HIGH (ref 40–150)
BUN: 13 mg/dL (ref 7–21)
Bilirubin, Total: 0.6 mg/dL (ref 0.2–1.2)
CO2: 28 (ref 22–29)
Calcium: 9.1 mg/dL (ref 7.9–10.6)
Chloride: 90 — ABNORMAL LOW (ref 98–107)
Creatinine: 0.8 mg/dL (ref 0.6–1.0)
Globulin: 4.9 g/dL — ABNORMAL HIGH (ref 2.0–3.6)
Glucose: 222 mg/dL — ABNORMAL HIGH (ref 70–100)
Potassium: 3.9 (ref 3.5–5.1)
Protein, Total: 8.1 g/dL (ref 6.0–8.3)
Sodium: 131 — ABNORMAL LOW (ref 136–145)

## 2013-01-07 LAB — URINALYSIS, REFLEX TO MICROSCOPIC EXAM IF INDICATED
Bilirubin, UA: NEGATIVE
Glucose, UA: NEGATIVE
Ketones UA: NEGATIVE
Leukocyte Esterase, UA: NEGATIVE
Nitrite, UA: NEGATIVE
Protein, UR: 100 — AB
Specific Gravity UA: 1.005 (ref 1.001–1.035)
Urine pH: 8 (ref 5.0–8.0)
Urobilinogen, UA: NORMAL mg/dL

## 2013-01-07 LAB — GFR: EGFR: >60

## 2013-01-07 LAB — POCT GLUCOSE: Whole Blood Glucose POCT: 187 mg/dL — AB (ref 70–100)

## 2013-01-07 LAB — TROPONIN I: Troponin I: 0.02 ng/mL (ref 0.00–0.09)

## 2013-01-07 MED ORDER — DEXTROSE 50 % IV SOLN
25.0000 mL | INTRAVENOUS | Status: DC | PRN
Start: 2013-01-07 — End: 2013-01-07

## 2013-01-07 MED ORDER — INSULIN ASPART 100 UNIT/ML SC SOLN
1.0000 [IU] | Freq: Every evening | SUBCUTANEOUS | Status: DC | PRN
Start: 2013-01-07 — End: 2013-01-16
  Administered 2013-01-07 – 2013-01-08 (×2): 2 [IU] via SUBCUTANEOUS
  Administered 2013-01-10 – 2013-01-15 (×4): 1 [IU] via SUBCUTANEOUS
  Filled 2013-01-07: qty 10
  Filled 2013-01-07: qty 50
  Filled 2013-01-07: qty 20
  Filled 2013-01-07 (×3): qty 10
  Filled 2013-01-07 (×2): qty 20

## 2013-01-07 MED ORDER — LISINOPRIL 5 MG PO TABS
5.0000 mg | ORAL_TABLET | Freq: Every day | ORAL | Status: DC
Start: 2013-01-07 — End: 2013-01-07

## 2013-01-07 MED ORDER — GLUCAGON HCL (RDNA) 1 MG IJ SOLR
1.0000 mg | INTRAMUSCULAR | Status: DC | PRN
Start: 2013-01-07 — End: 2013-01-07

## 2013-01-07 MED ORDER — AMLODIPINE BESYLATE 5 MG PO TABS
5.0000 mg | ORAL_TABLET | Freq: Every day | ORAL | Status: DC
Start: 2013-01-07 — End: 2013-01-16
  Administered 2013-01-07 – 2013-01-16 (×9): 5 mg via ORAL
  Filled 2013-01-07 (×10): qty 1

## 2013-01-07 MED ORDER — GLUCOSE 40 % PO GEL
15.0000 g | ORAL | Status: DC | PRN
Start: 2013-01-07 — End: 2013-01-16

## 2013-01-07 MED ORDER — DEXTROSE 50 % IV SOLN
25.0000 mL | INTRAVENOUS | Status: DC | PRN
Start: 2013-01-07 — End: 2013-01-16

## 2013-01-07 MED ORDER — GLUCOSE 40 % PO GEL
15.0000 g | ORAL | Status: DC | PRN
Start: 2013-01-07 — End: 2013-01-07

## 2013-01-07 MED ORDER — SODIUM CHLORIDE 0.9 % IV SOLN
INTRAVENOUS | Status: DC
Start: 2013-01-07 — End: 2013-01-11

## 2013-01-07 MED ORDER — LISINOPRIL 5 MG PO TABS
2.5000 mg | ORAL_TABLET | Freq: Every day | ORAL | Status: DC
Start: 2013-01-07 — End: 2013-01-16
  Administered 2013-01-07 – 2013-01-16 (×9): 2.5 mg via ORAL
  Filled 2013-01-07 (×9): qty 1

## 2013-01-07 MED ORDER — SODIUM CHLORIDE 0.9 % IV BOLUS
1000.0000 mL | Freq: Once | INTRAVENOUS | Status: AC
Start: 2013-01-07 — End: 2013-01-07
  Administered 2013-01-07: 1000 mL via INTRAVENOUS

## 2013-01-07 MED ORDER — INSULIN ASPART 100 UNIT/ML SC SOLN
1.0000 [IU] | Freq: Three times a day (TID) | SUBCUTANEOUS | Status: DC | PRN
Start: 2013-01-07 — End: 2013-01-16
  Administered 2013-01-07 – 2013-01-08 (×2): 1 [IU] via SUBCUTANEOUS
  Administered 2013-01-08: 4 [IU] via SUBCUTANEOUS
  Administered 2013-01-09: 5 [IU] via SUBCUTANEOUS
  Administered 2013-01-09 – 2013-01-10 (×2): 2 [IU] via SUBCUTANEOUS
  Administered 2013-01-10: 1 [IU] via SUBCUTANEOUS
  Administered 2013-01-10 – 2013-01-11 (×2): 3 [IU] via SUBCUTANEOUS
  Administered 2013-01-11 – 2013-01-14 (×4): 1 [IU] via SUBCUTANEOUS
  Administered 2013-01-14: 2 [IU] via SUBCUTANEOUS
  Administered 2013-01-16: 1 [IU] via SUBCUTANEOUS
  Filled 2013-01-07: qty 30
  Filled 2013-01-07 (×3): qty 10
  Filled 2013-01-07: qty 20
  Filled 2013-01-07: qty 40
  Filled 2013-01-07: qty 20
  Filled 2013-01-07 (×4): qty 10
  Filled 2013-01-07 (×2): qty 20
  Filled 2013-01-07: qty 30
  Filled 2013-01-07: qty 10
  Filled 2013-01-07: qty 20
  Filled 2013-01-07: qty 10

## 2013-01-07 MED ORDER — AMLODIPINE BESYLATE 5 MG PO TABS
2.5000 mg | ORAL_TABLET | Freq: Every day | ORAL | Status: DC
Start: 2013-01-07 — End: 2013-01-07
  Filled 2013-01-07: qty 1

## 2013-01-07 MED ORDER — GLUCAGON HCL (RDNA) 1 MG IJ SOLR
1.0000 mg | INTRAMUSCULAR | Status: DC | PRN
Start: 2013-01-07 — End: 2013-01-16

## 2013-01-07 MED ORDER — ACETAMINOPHEN 325 MG PO TABS
650.0000 mg | ORAL_TABLET | Freq: Once | ORAL | Status: AC
Start: 2013-01-07 — End: 2013-01-07
  Administered 2013-01-07: 650 mg via ORAL
  Filled 2013-01-07: qty 2

## 2013-01-07 NOTE — ED Notes (Signed)
Reports generalized weakness for a few days, unable to get out of bed for the last few days.

## 2013-01-07 NOTE — ED Provider Notes (Deleted)
EMERGENCY DEPARTMENT NOTE    Physician/Midlevel provider first contact with patient: 01/07/13 1300         HISTORY OF PRESENT ILLNESS   Historian: patient  Translator Used: no    79 y.o. female presents with headache, coughing up mucous.    1. Location of symptoms: headache, coughing up mucous  2. Onset of symptoms: 3-4 days ago  3. What was patient doing when symptoms started (Context): n/a  4. Severity: moderate  5. Timing: gradual onset, constant  6. Activities that worsen symptoms: none  7. Activities that improve symptoms: tramadol  8. Quality: pressure type headache above above nose and in front of face, coughing up mucous  9. Radiation of symptoms: none  10. Associated signs and Symptoms: none  11. Are symptoms worsening? yes  MEDICAL HISTORY     Past Medical History:  Past Medical History   Diagnosis Date   . Diabetes mellitus without complication    . Hypertensive disorder    . Arthritis      rheumatoid       Past Surgical History:  Past Surgical History   Procedure Date   . Joint replacement 2005     BILATERAL THR   . Fracture surgery    . Eye surgery    . Hysterectomy 1980'S   . Block, epidural steroid / lumb-sac 12/18/2012     Procedure: BLOCK, EPIDURAL STEROID / LUMB-SAC;  Surgeon: Caro Laroche, MD;  Location: [facility];  Service: Anesthesiology;  Laterality: N/A;   . Block, epidural steroid / lumb-sac 01/01/2013     Procedure: BLOCK, EPIDURAL STEROID / LUMB-SAC;  Surgeon: Caro Laroche, MD;  Location: [facility];  Service: Anesthesiology;  Laterality: N/A;       Social History:  History     Social History   . Marital Status: Widowed     Spouse Name: N/A     Number of Children: N/A   . Years of Education: N/A     Occupational History   . Not on file.     Social History Main Topics   . Smoking status: Never Smoker    . Smokeless tobacco: Not on file   . Alcohol Use: No   . Drug Use: No   . Sexually Active:      Other Topics Concern   . Not on file     Social History Narrative   . No narrative on file       Family  History:  History reviewed. No pertinent family history.    Outpatient Medication:  Previous Medications    ACETAMINOPHEN (TYLENOL) 500 MG TABLET    Take 1,000 mg by mouth every 6 (six) hours as needed.    AMLODIPINE (NORVASC) 5 MG TABLET    Take 5 mg by mouth daily.    ASPIRIN 81 MG TABLET    Take 81 mg by mouth daily.    CALCIUM-VITAMIN D (OSCAL-500) 500-200 MG-UNIT PER TABLET    Take 1 tablet by mouth daily.    CETIRIZINE (ZYRTEC) 10 MG TABLET    Take 10 mg by mouth daily.    FUROSEMIDE (LASIX) 20 MG TABLET    Take 20 mg by mouth daily.     GABAPENTIN (NEURONTIN) 600 MG TABLET    Take 600 mg by mouth 2 (two) times daily.    GLIPIZIDE (GLUCOTROL) 5 MG TABLET    Take 5 mg by mouth 2 (two) times daily before meals.    INSULIN ASPART (NOVOLOG) 100 UNIT/ML  INJECTION    Inject into the skin 3 (three) times daily before meals. Sliding scale    INSULIN GLARGINE (LANTUS) 100 UNIT/ML INJECTION    Inject 15 Units into the skin nightly.    LISINOPRIL (PRINIVIL,ZESTRIL) 2.5 MG TABLET    Take 1 tablet (2.5 mg total) by mouth daily.    MAGNESIUM OXIDE (MAG-OX) 400 MG TABLET    Take 400 mg by mouth daily.    METHOTREXATE (RHEUMATREX) 2.5 MG TABLET    Take 7.5 mg by mouth every mon, wed and fri.    NAPROXEN (NAPROSYN) 500 MG TABLET    Take 500 mg by mouth 2 (two) times daily with meals.    OXYCODONE-ACETAMINOPHEN (PERCOCET) 5-325 MG PER TABLET    Take 1 tablet by mouth every 8 (eight) hours as needed.    PANTOPRAZOLE (PROTONIX) 40 MG TABLET    Take 40 mg by mouth daily.    PREDNISONE (DELTASONE) 5 MG TABLET    Take 5 mg by mouth daily.    SUCRALFATE (CARAFATE) 1 G TABLET    Take 1 g by mouth daily.    TRAMADOL-ACETAMINOPHEN (ULTRACET) 37.5-325 MG PER TABLET    Take 1 tablet by mouth every 6 (six) hours as needed.    VITAMIN B-12 (CYANOCOBALAMIN) 500 MCG TABLET    Take 500 mcg by mouth daily.       Allergies:  Allergies   Allergen Reactions   . Azithromycin    . Dilaudid (Hydromorphone Hcl)    . Erythromycin    . Iodine      Iv  contrast         REVIEW OF SYSTEMS   Review of Systems   Constitutional: Positive for malaise/fatigue. Negative for fever and chills.   HENT: Positive for congestion. Negative for sore throat.    Eyes: Negative.    Respiratory: Positive for cough and sputum production. Negative for shortness of breath.    Cardiovascular: Negative for chest pain.   Gastrointestinal: Negative for nausea, vomiting, abdominal pain and diarrhea.   Musculoskeletal: Negative for myalgias.   Skin: Negative for rash.   Neurological: Positive for headaches. Negative for dizziness, sensory change, focal weakness, loss of consciousness and weakness.   All other systems reviewed and are negative.          PHYSICAL EXAM     Filed Vitals:    01/07/13 1249   BP: 195/89   Pulse: 80   Temp: 99.5 F (37.5 C)   Resp: 18   SpO2: 98%       Nursing note and vitals reviewed.    Constitutional: sitting comfortably, well appearing  Head: Atraumatic.  Eyes: PERRL. EOMI. No scleral icterus.  ENT: Mucous membranes are moist and intact. Oropharynx is clear. Patent airway.  Neck: Supple. No cervical lymphadenopathy.  Cardiovascular: Regular rate. Regular rhythm. No murmurs, rubs, or gallops.  Pulmonary/Chest: No evidence of respiratory distress. Clear to auscultation bilaterally. No wheezing, rales or rhonchi.   GI: Soft, non-distended abdomen. No tenderness to palpation of abdomen.  Extremities: No edema. No deformity.  Skin: No rash.   Neurological: Awake, alert and oriented x 3. CN II-XII intact. Strength intact. Sensation intact.  Psychiatric: Appropriate affect. Appropriate mood. Appropriate behavior.    MEDICAL DECISION MAKING   Patient has sinusitis, likely viral. Prescribed antibiotics to be taken in 2 days if not improving. Otherwise, symptomatic treatment, Ibuprofen/Tylenol/already taking Claritin/already taking nasal steroid. Advised follow up with PCP. Discharged home.    DISCUSSION  Vital Signs: Reviewed the patient?s vital signs.   Nursing  Notes: Reviewed and utilized available nursing notes.  Medical Records Reviewed: Reviewed available past medical records.  Counseling: The emergency provider has spoken with the patient and discussed today?s findings, in addition to providing specific details for the plan of care.  Questions are answered and there is agreement with the plan.    IMAGING STUDIES    The following imaging studies were independently interpreted by the Emergency Medicine Physician.  For full imaging study results please see chart.    CARDIAC STUDIES     The following cardiac studies were independently interpreted by the Emergency Medicine Physician. For full cardiac study results please see chart     PULSE OXIMETRY    Oxygen Saturation by Pulse Oximetry: 98% RA  Interventions: none  Interpretation: normal    EMERGENCY DEPT. MEDICATIONS      ED Medication Orders     None          LABORATORY RESULTS    Ordered and independently interpreted AVAILABLE laboratory tests. Please see results section in chart for full details.    CONSULTATIONS        CRITICAL CARE        ATTESTATIONS        Physician Attestation: Darlyn Read MD, have been the primary provider for [name] during this Emergency Dept visit and have reviewed the chart for accuracy and agree with its content.       DIAGNOSIS      Diagnosis:  Final diagnoses:   None       Disposition:  ED Disposition     None          Prescriptions:  Patient's Medications   New Prescriptions    No medications on file   Previous Medications    ACETAMINOPHEN (TYLENOL) 500 MG TABLET    Take 1,000 mg by mouth every 6 (six) hours as needed.    AMLODIPINE (NORVASC) 5 MG TABLET    Take 5 mg by mouth daily.    ASPIRIN 81 MG TABLET    Take 81 mg by mouth daily.    CALCIUM-VITAMIN D (OSCAL-500) 500-200 MG-UNIT PER TABLET    Take 1 tablet by mouth daily.    CETIRIZINE (ZYRTEC) 10 MG TABLET    Take 10 mg by mouth daily.    FUROSEMIDE (LASIX) 20 MG TABLET    Take 20 mg by mouth daily.     GABAPENTIN  (NEURONTIN) 600 MG TABLET    Take 600 mg by mouth 2 (two) times daily.    GLIPIZIDE (GLUCOTROL) 5 MG TABLET    Take 5 mg by mouth 2 (two) times daily before meals.    INSULIN ASPART (NOVOLOG) 100 UNIT/ML INJECTION    Inject into the skin 3 (three) times daily before meals. Sliding scale    INSULIN GLARGINE (LANTUS) 100 UNIT/ML INJECTION    Inject 15 Units into the skin nightly.    LISINOPRIL (PRINIVIL,ZESTRIL) 2.5 MG TABLET    Take 1 tablet (2.5 mg total) by mouth daily.    MAGNESIUM OXIDE (MAG-OX) 400 MG TABLET    Take 400 mg by mouth daily.    METHOTREXATE (RHEUMATREX) 2.5 MG TABLET    Take 7.5 mg by mouth every mon, wed and fri.    NAPROXEN (NAPROSYN) 500 MG TABLET    Take 500 mg by mouth 2 (two) times daily with meals.    OXYCODONE-ACETAMINOPHEN (PERCOCET) 5-325 MG PER TABLET  Take 1 tablet by mouth every 8 (eight) hours as needed.    PANTOPRAZOLE (PROTONIX) 40 MG TABLET    Take 40 mg by mouth daily.    PREDNISONE (DELTASONE) 5 MG TABLET    Take 5 mg by mouth daily.    SUCRALFATE (CARAFATE) 1 G TABLET    Take 1 g by mouth daily.    TRAMADOL-ACETAMINOPHEN (ULTRACET) 37.5-325 MG PER TABLET    Take 1 tablet by mouth every 6 (six) hours as needed.    VITAMIN B-12 (CYANOCOBALAMIN) 500 MCG TABLET    Take 500 mcg by mouth daily.   Modified Medications    No medications on file   Discontinued Medications    No medications on file         Marland Mcalpine, MD  01/07/13 1306

## 2013-01-07 NOTE — ED Notes (Signed)
Report faxed to [facility]

## 2013-01-07 NOTE — ED Provider Notes (Signed)
EMERGENCY DEPARTMENT NOTE    Physician/Midlevel provider first contact with patient: 01/07/13 1300         HISTORY OF PRESENT ILLNESS   Historian: patient  Translator Used: no    79 y.o. female presents with general weakness.    1. Location of symptoms: general weakness  2. Onset of symptoms: few days ago  3. What was patient doing when symptoms started (Context): lives at [facility]  4. Severity: severe  5. Timing: gradual onset, constant  6. Activities that worsen symptoms: none  7. Activities that improve symptoms: none  8. Quality: unable to get out bed   9. Radiation of symptoms: none  10. Associated signs and Symptoms: decreased appetite  11. Are symptoms worsening? yes  MEDICAL HISTORY     Past Medical History:  Past Medical History   Diagnosis Date   . Diabetes mellitus without complication    . Hypertensive disorder    . Arthritis      rheumatoid       Past Surgical History:  Past Surgical History   Procedure Date   . Joint replacement 2005     BILATERAL THR   . Fracture surgery    . Eye surgery    . Hysterectomy 1980'S   . Block, epidural steroid / lumb-sac 12/18/2012     Procedure: BLOCK, EPIDURAL STEROID / LUMB-SAC;  Surgeon: Caro Laroche, MD;  Location: [facility];  Service: Anesthesiology;  Laterality: N/A;   . Block, epidural steroid / lumb-sac 01/01/2013     Procedure: BLOCK, EPIDURAL STEROID / LUMB-SAC;  Surgeon: Caro Laroche, MD;  Location: [facility];  Service: Anesthesiology;  Laterality: N/A;       Social History:  History     Social History   . Marital Status: Widowed     Spouse Name: N/A     Number of Children: N/A   . Years of Education: N/A     Occupational History   . Not on file.     Social History Main Topics   . Smoking status: Never Smoker    . Smokeless tobacco: Not on file   . Alcohol Use: No   . Drug Use: No   . Sexually Active:      Other Topics Concern   . Not on file     Social History Narrative   . No narrative on file       Family History:  History reviewed. No pertinent family  history.    Outpatient Medication:  Previous Medications    ACETAMINOPHEN (TYLENOL) 500 MG TABLET    Take 1,000 mg by mouth every 6 (six) hours as needed.    AMLODIPINE (NORVASC) 5 MG TABLET    Take 5 mg by mouth daily.    ASPIRIN 81 MG TABLET    Take 81 mg by mouth daily.    CALCIUM-VITAMIN D (OSCAL-500) 500-200 MG-UNIT PER TABLET    Take 1 tablet by mouth daily.    CETIRIZINE (ZYRTEC) 10 MG TABLET    Take 10 mg by mouth daily.    FUROSEMIDE (LASIX) 20 MG TABLET    Take 20 mg by mouth daily.     GABAPENTIN (NEURONTIN) 600 MG TABLET    Take 600 mg by mouth 2 (two) times daily.    GLIPIZIDE (GLUCOTROL) 5 MG TABLET    Take 5 mg by mouth 2 (two) times daily before meals.    INSULIN ASPART (NOVOLOG) 100 UNIT/ML INJECTION    Inject into the  skin 3 (three) times daily before meals. Sliding scale    INSULIN GLARGINE (LANTUS) 100 UNIT/ML INJECTION    Inject 15 Units into the skin nightly.    LISINOPRIL (PRINIVIL,ZESTRIL) 2.5 MG TABLET    Take 1 tablet (2.5 mg total) by mouth daily.    MAGNESIUM OXIDE (MAG-OX) 400 MG TABLET    Take 400 mg by mouth daily.    METHOTREXATE (RHEUMATREX) 2.5 MG TABLET    Take 7.5 mg by mouth every mon, wed and fri.    NAPROXEN (NAPROSYN) 500 MG TABLET    Take 500 mg by mouth 2 (two) times daily with meals.    OXYCODONE-ACETAMINOPHEN (PERCOCET) 5-325 MG PER TABLET    Take 1 tablet by mouth every 8 (eight) hours as needed.    PANTOPRAZOLE (PROTONIX) 40 MG TABLET    Take 40 mg by mouth daily.    PREDNISONE (DELTASONE) 5 MG TABLET    Take 5 mg by mouth daily.    SUCRALFATE (CARAFATE) 1 G TABLET    Take 1 g by mouth daily.    TRAMADOL-ACETAMINOPHEN (ULTRACET) 37.5-325 MG PER TABLET    Take 1 tablet by mouth every 6 (six) hours as needed.    VITAMIN B-12 (CYANOCOBALAMIN) 500 MCG TABLET    Take 500 mcg by mouth daily.       Allergies:  Allergies   Allergen Reactions   . Azithromycin    . Dilaudid (Hydromorphone Hcl)    . Erythromycin    . Iodine      Iv contrast         REVIEW OF SYSTEMS   Review of  Systems   Constitutional: Positive for malaise/fatigue. Negative for fever and chills.   HENT: Negative for congestion and sore throat.    Eyes: Negative.    Respiratory: Negative for cough and shortness of breath.    Cardiovascular: Negative for chest pain.   Gastrointestinal: Negative for nausea, vomiting, abdominal pain and diarrhea.   Genitourinary: Negative for dysuria.        Incontinent at baseline   Musculoskeletal: Positive for myalgias.   Skin: Negative for rash.   Neurological: Positive for weakness. Negative for dizziness, sensory change, focal weakness, loss of consciousness and headaches.   All other systems reviewed and are negative.          PHYSICAL EXAM     Filed Vitals:    01/07/13 1518   BP: 149/68   Pulse: 73   Temp: 98.7 F (37.1 C)   Resp: 20   SpO2: 99%       Nursing note and vitals reviewed.    Constitutional: general weakness  Head: Atraumatic.  Eyes: PERRL. EOMI. No scleral icterus.  ENT: Mucous membranes are dry. Food stuff in mouth. Patent airway.  Neck: Supple. No cervical lymphadenopathy.  Cardiovascular: Regular rate. Regular rhythm. No murmurs, rubs, or gallops.  Pulmonary/Chest: No evidence of respiratory distress. Clear to auscultation bilaterally. No wheezing, rales or rhonchi.   GI: Soft, non-distended abdomen. No tenderness to palpation of abdomen.  Extremities: No edema. No deformity.  Skin: No rash.   Neurological: Awake, alert and oriented x 3. CN II-XII intact. General weakness. Moving all ext.  Psychiatric: Appropriate affect. Appropriate mood. Appropriate behavior.    MEDICAL DECISION MAKING   General weakness for days. Says everything hurst. ROS unrevealing. No PNA, no UTI, no evidence of MI. Hyponatremia noted. Treating with IV fluids. Admitted Dr. Vance Gather. Lives in assisted living facility, not safe to discharge back,  likely to deteriorate there.    DISCUSSION      Vital Signs: Reviewed the patient?s vital signs.   Nursing Notes: Reviewed and utilized available nursing  notes.  Medical Records Reviewed: Reviewed available past medical records.  Counseling: The emergency provider has spoken with the patient and discussed today?s findings, in addition to providing specific details for the plan of care.  Questions are answered and there is agreement with the plan.    IMAGING STUDIES    The following imaging studies were independently interpreted by the Emergency Medicine Physician.  For full imaging study results please see chart.    CARDIAC STUDIES     The following cardiac studies were independently interpreted by the Emergency Medicine Physician. For full cardiac study results please see chart     EKG Interpretation:   Signed and interpreted by ED Physician   Time Interpreted: 1336  Comparison:   Rate: 74  Rhythm: nsr   Axis: lad  Intervals: normal  Blocks: none  ST segments: no STEMI  Interpretation: LVH. Abnormal EKG      PULSE OXIMETRY    Oxygen Saturation by Pulse Oximetry: 98% RA  Interventions: none  Interpretation: normal    EMERGENCY DEPT. MEDICATIONS      ED Medication Orders      Start     Status Ordering Provider    01/07/13 1319   acetaminophen (TYLENOL) tablet 650 mg   Once      Route: Oral  Ordered Dose: 650 mg         Last MAR action:  Given L[facility]    01/07/13 1319   sodium chloride 0.9 % bolus 1,000 mL   Once      Route: Intravenous  Ordered Dose: 1,000 mL         Last MAR action:  New Bag Maureen Duesing [facility]                LABORATORY RESULTS    Ordered and independently interpreted AVAILABLE laboratory tests. Please see results section in chart for full details.  Results for orders placed during the hospital encounter of 01/07/13   CBC AND DIFFERENTIAL       Component Value Range    WBC 8.52  3.50 - 10.80    RBC 3.84 (*) 4.20 - 5.40    Hgb 11.7 (*) 12.0 - 16.0 g/dL    Hematocrit 16.1 (*) 37.0 - 47.0 %    MCV 92.4  80.0 - 100.0 fL    MCH 30.5  28.0 - 32.0 pg    MCHC 33.0  32.0 - 36.0 g/dL    RDW 15  12 - 15 %    Platelets 216  140 - 400    MPV  10.8  9.4 - 12.3 fL    Neutrophils 74  None %    Lymphocytes Automated 15  None %    Monocytes 10  None %    Eosinophils Automated 0  None %    Basophils Automated 1  None %    Neutrophils Absolute 6.33  1.80 - 8.10    Abs Lymph Automated 1.26  0.50 - 4.40    Abs Mono Automated 0.84  0.00 - 1.20    Abs Eos Automated 0.04  0.00 - 0.70    Absolute Baso Automated 0.05  0.00 - 0.20   COMPREHENSIVE METABOLIC PANEL       Component Value Range    Glucose 222 (*) 70 - 100  mg/dL    BUN 13  7 - 21 mg/dL    Creatinine 0.8  0.6 - 1.0 mg/dL    Sodium 960 (*) 454 - 145    Potassium 3.9  3.5 - 5.1    Chloride 90 (*) 98 - 107    CO2 28  22 - 29    CALCIUM 9.1  7.9 - 10.6 mg/dL    Protein, Total 8.1  6.0 - 8.3 g/dL    Albumin 3.2 (*) 3.5 - 5.0 g/dL    AST (SGOT) 21  5 - 34 U/L    ALT 26  0 - 55 U/L    Alkaline Phosphatase 244 (*) 40 - 150 U/L    Bilirubin, Total 0.6  0.2 - 1.2 mg/dL    Globulin 4.9 (*) 2.0 - 3.6 g/dL    Albumin/Globulin Ratio 0.7 (*) 0.9 - 2.2   TROPONIN I       Component Value Range    Troponin I 0.02  0.00 - 0.09 ng/mL   GFR       Component Value Range    EGFR >60.0     URINALYSIS, REFLEX TO MICROSCOPIC EXAM IF INDICATED       Component Value Range    Urine Type Catheterized, I      Color, UA Yellow  Clear - Yellow    Clarity, UA Slightly Cloudy  Clear - Hazy    Specific Gravity UA 1.005  1.001 - 1.035    Urine pH 8.0  5.0 - 8.0    Leukocyte Esterase, UA Negative  Negative    Nitrite, UA Negative  Negative    Protein, UA 100 (*) Negative    Glucose, UA Negative  Negative    Ketones UA Negative  Negative    Urobilinogen, UA Normal  0.2  -  2.0 mg/dL    Bilirubin, UA Negative  Negative    Blood, UA Small (*) Negative    RBC, UA 0 - 5  0 - 5    WBC, UA 0 - 5  0 - 5    Urine Bacteria Rare  Rare       CONSULTATIONS        CRITICAL CARE        ATTESTATIONS        Physician Attestation: Darlyn Read MD, have been the primary provider for [name] during this Emergency Dept visit and have reviewed the chart  for accuracy and agree with its content.       DIAGNOSIS      Diagnosis:  Final diagnoses:   General weakness   Hyponatremia       Disposition:  ED Disposition     Admit Admitting Physician: Everlean Patterson [[id number]]  Diagnosis: General weakness [301173]  Estimated Length of Stay: > or = to 2 midnights  Tentative Discharge Plan?: Home or Self Care [1]  Patient Class: Inpatient [101]  I certify that inpatient services are medically necessary for this patient. Please see H&P and MD progress notes for additional information about the patient's course of treatment. For Medicare patients, services provided in accordance with 412.3 and expected LOS to be greater than 2 midnights for Medicare patients.: Yes            Prescriptions:  Patient's Medications   New Prescriptions    No medications on file   Previous Medications    ACETAMINOPHEN (TYLENOL) 500 MG TABLET    Take 1,000 mg by mouth every  6 (six) hours as needed.    AMLODIPINE (NORVASC) 5 MG TABLET    Take 5 mg by mouth daily.    ASPIRIN 81 MG TABLET    Take 81 mg by mouth daily.    CALCIUM-VITAMIN D (OSCAL-500) 500-200 MG-UNIT PER TABLET    Take 1 tablet by mouth daily.    CETIRIZINE (ZYRTEC) 10 MG TABLET    Take 10 mg by mouth daily.    FUROSEMIDE (LASIX) 20 MG TABLET    Take 20 mg by mouth daily.     GABAPENTIN (NEURONTIN) 600 MG TABLET    Take 600 mg by mouth 2 (two) times daily.    GLIPIZIDE (GLUCOTROL) 5 MG TABLET    Take 5 mg by mouth 2 (two) times daily before meals.    INSULIN ASPART (NOVOLOG) 100 UNIT/ML INJECTION    Inject into the skin 3 (three) times daily before meals. Sliding scale    INSULIN GLARGINE (LANTUS) 100 UNIT/ML INJECTION    Inject 15 Units into the skin nightly.    LISINOPRIL (PRINIVIL,ZESTRIL) 2.5 MG TABLET    Take 1 tablet (2.5 mg total) by mouth daily.    MAGNESIUM OXIDE (MAG-OX) 400 MG TABLET    Take 400 mg by mouth daily.    METHOTREXATE (RHEUMATREX) 2.5 MG TABLET    Take 7.5 mg by mouth every mon, wed and fri.    NAPROXEN (NAPROSYN)  500 MG TABLET    Take 500 mg by mouth 2 (two) times daily with meals.    OXYCODONE-ACETAMINOPHEN (PERCOCET) 5-325 MG PER TABLET    Take 1 tablet by mouth every 8 (eight) hours as needed.    PANTOPRAZOLE (PROTONIX) 40 MG TABLET    Take 40 mg by mouth daily.    PREDNISONE (DELTASONE) 5 MG TABLET    Take 5 mg by mouth daily.    SUCRALFATE (CARAFATE) 1 G TABLET    Take 1 g by mouth daily.    TRAMADOL-ACETAMINOPHEN (ULTRACET) 37.5-325 MG PER TABLET    Take 1 tablet by mouth every 6 (six) hours as needed.    VITAMIN B-12 (CYANOCOBALAMIN) 500 MCG TABLET    Take 500 mcg by mouth daily.   Modified Medications    No medications on file   Discontinued Medications    No medications on file         Marland Mcalpine, MD  01/07/13 1610

## 2013-01-07 NOTE — Progress Notes (Signed)
Patient was admitted from the ED due to a diagnosis of weakness. The patient is alert and oriented x4 with some forgetfulness. Patient had BP of 170/69, on call attending was notified, orders placed for norvasc and lisinopril daily. As well as orders for low dose sliding scale. Norvasc was administered for the BP.  1 unit of insulin was administered for blood sugar of 187. Patient is on isolation precautions for MDR. Fall precautions in place, call bell within reach. Incontinent care done. Report given to night nurse to continue with care.

## 2013-01-08 ENCOUNTER — Inpatient Hospital Stay: Payer: Medicare Other

## 2013-01-08 LAB — POCT GLUCOSE
Whole Blood Glucose POCT: 197 mg/dL — AB (ref 70–100)
Whole Blood Glucose POCT: 309 mg/dL — AB (ref 70–100)
Whole Blood Glucose POCT: 256 mg/dL — AB (ref 70–100)
Whole Blood Glucose POCT: 255 mg/dL — AB (ref 70–100)

## 2013-01-08 LAB — ECG 12-LEAD
Atrial Rate: 74 {beats}/min
P Axis: 42 degrees
P-R Interval: 140 ms
Q-T Interval: 390 ms
QRS Duration: 86 ms
QTC Calculation (Bezet): 432 ms
R Axis: -26 degrees
T Axis: 6 degrees
Ventricular Rate: 74 {beats}/min

## 2013-01-08 MED ORDER — PANTOPRAZOLE SODIUM 40 MG PO TBEC
40.0000 mg | DELAYED_RELEASE_TABLET | Freq: Every morning | ORAL | Status: DC
Start: 2013-01-08 — End: 2013-01-16
  Administered 2013-01-08 – 2013-01-16 (×9): 40 mg via ORAL
  Filled 2013-01-08 (×9): qty 1

## 2013-01-08 MED ORDER — OXYCODONE-ACETAMINOPHEN 5-325 MG PO TABS
1.0000 | ORAL_TABLET | Freq: Three times a day (TID) | ORAL | Status: DC | PRN
Start: 2013-01-08 — End: 2013-01-16

## 2013-01-08 MED ORDER — PREDNISONE 5 MG PO TABS
5.0000 mg | ORAL_TABLET | Freq: Every morning | ORAL | Status: DC
Start: 2013-01-08 — End: 2013-01-16
  Administered 2013-01-08 – 2013-01-16 (×9): 5 mg via ORAL
  Filled 2013-01-08 (×9): qty 1

## 2013-01-08 MED ORDER — SUCRALFATE 1 G PO TABS
1.0000 g | ORAL_TABLET | Freq: Every day | ORAL | Status: DC
Start: 2013-01-08 — End: 2013-01-16
  Administered 2013-01-08 – 2013-01-16 (×8): 1 g via ORAL
  Filled 2013-01-08 (×9): qty 1

## 2013-01-08 MED ORDER — SODIUM CHLORIDE 0.9 % IV MBP
4.5000 g | Freq: Three times a day (TID) | INTRAVENOUS | Status: DC
Start: 2013-01-08 — End: 2013-01-11
  Administered 2013-01-08 – 2013-01-11 (×9): 4.5 g via INTRAVENOUS
  Filled 2013-01-08 (×11): qty 4000

## 2013-01-08 MED ORDER — GABAPENTIN 400 MG PO CAPS
400.0000 mg | ORAL_CAPSULE | Freq: Two times a day (BID) | ORAL | Status: DC
Start: 2013-01-08 — End: 2013-01-16
  Administered 2013-01-08 – 2013-01-16 (×15): 400 mg via ORAL
  Filled 2013-01-08 (×16): qty 1

## 2013-01-08 MED ORDER — ACETAMINOPHEN 650 MG RE SUPP
650.0000 mg | Freq: Four times a day (QID) | RECTAL | Status: DC | PRN
Start: 2013-01-08 — End: 2013-01-16
  Administered 2013-01-08 – 2013-01-10 (×2): 650 mg via RECTAL
  Filled 2013-01-08 (×2): qty 1

## 2013-01-08 MED ORDER — TRAMADOL HCL 50 MG PO TABS
ORAL_TABLET | Freq: Four times a day (QID) | ORAL | Status: DC | PRN
Start: 2013-01-08 — End: 2013-01-16
  Filled 2013-01-08 (×6): qty 1

## 2013-01-08 MED ORDER — VITAMIN B-12 500 MCG PO TABS
500.0000 ug | ORAL_TABLET | Freq: Every day | ORAL | Status: DC
Start: 2013-01-08 — End: 2013-01-16
  Administered 2013-01-08 – 2013-01-16 (×8): 500 ug via ORAL
  Filled 2013-01-08 (×8): qty 1

## 2013-01-08 MED ORDER — ASPIRIN 81 MG PO CHEW
81.0000 mg | CHEWABLE_TABLET | Freq: Every day | ORAL | Status: DC
Start: 2013-01-08 — End: 2013-01-16
  Administered 2013-01-08 – 2013-01-16 (×9): 81 mg via ORAL
  Filled 2013-01-08 (×9): qty 1

## 2013-01-08 NOTE — Consults (Signed)
Full consult dictated  I have left messages for 3 different people to find out who is POA.  A per old records patient wanted to be DNR when she was here in Nov. 2013.

## 2013-01-08 NOTE — PT Eval Note (Signed)
[facility]  [address]  [place], [place] [address]  [phone]    Physical Therapy Evaluation    Patient: [name] MRN: [id number]   Unit: 3B ONCOLOGY GENERAL MEDICINE Bed: W413/K440-10    Time of Treatment: Time Calculation  PT Received On: 01/08/13  Start Time: 1215  Stop Time: 1230  Time Calculation (min): 15 min    Consult received for [name] for PT evaluation and treatment.  Patient's medical condition is appropriate for Physical Therapy  intervention at this time.    Medical Diagnosis: Hyponatremia [276.1]  General weakness [780.79]  General weakness     History of Present Illness: [name] is a 79 y.o. female admitted on  01/07/2013 with generalized weakness for a few days, unable to get out of bed for the last few days    Patient Active Problem List   Diagnosis   . Type II or unspecified type diabetes mellitus with neurological manifestations, not stated as uncontrolled(250.60)   . Atherosclerosis of native arteries of the extremities, unspecified   . Type II or unspecified type diabetes mellitus with neurological manifestations, not stated as uncontrolled(250.60)   . Chronic venous insufficiency   . CHF (congestive heart failure)   . HTN (hypertension)   . History of DVT of lower extremity   . GERD (gastroesophageal reflux disease)   . RA (rheumatoid arthritis)   . Varicose veins of lower extremities with ulcer   . Anemia   . Diverticulitis   . AKI (acute kidney injury)   . CKD (chronic kidney disease), stage III   . Chest pain   . General weakness     Past Medical History   Diagnosis Date   . Diabetes mellitus without complication    . Hypertensive disorder    . Arthritis      rheumatoid     Past Surgical History   Procedure Date   . Joint replacement 2005     BILATERAL THR   . Fracture surgery    . Eye surgery    . Hysterectomy 1980'S   . Block, epidural steroid / lumb-sac 12/18/2012     Procedure: BLOCK, EPIDURAL STEROID / LUMB-SAC;  Surgeon: Caro Laroche, MD;   Location: [facility];  Service: Anesthesiology;  Laterality: N/A;   . Block, epidural steroid / lumb-sac 01/01/2013     Procedure: BLOCK, EPIDURAL STEROID / LUMB-SAC;  Surgeon: Caro Laroche, MD;  Location: [facility];  Service: Anesthesiology;  Laterality: N/A;     Precautions: falls, contact isolation    X-Rays/Tests/Labs:  Results for Miranda, [name] (MRN [id number]) as of 01/08/2013 13:36   Ref. Range 01/07/2013 13:06   Hemoglobin Latest Range: 12.0-16.0 g/dL 40.3 (L)   Hematocrit Latest Range: 37.0-47.0 % 35.5 (L)     Chest AP Portable (Order #[id number]) on 01/07/2013 - Imaging Information  IMPRESSION:   Chest demonstrates improvement in the opacities at the lung bases since   the recent prior film of 07/14/2012.   The lungs are clear.   Mild calcified aortic ectasia is detected. Some skeletal arthritis is   present.    Social History:  Pt poor history.  Social history unclear.  Per pt she amb w/ AD and requires assist for ADLs and mobility.    Subjective: Patient is agreeable to participation in the therapy session. Nursing clears patient for therapy.  Patient's Goal:  None stated  Pain: pt w/out c/o pain when questions, grimacing and crying out w/ movement of  LUE.    Objective:   Patient is in bed with  Intravenous (IV) in place.    Observation of patient/vitals:  Filed Vitals:    01/07/13 2007 01/07/13 2224 01/08/13 0655 01/08/13 0940   BP: 190/83 173/77 140/62 148/68   Pulse: 80 72 82 76   Temp: 98.4 F (36.9 C)  98.9 F (37.2 C) 101 F (38.3 C)   Resp: 20  20 16    SpO2: 99%  98%      Orientation/Cognition:  Alert and Oriented to self  Cognition: inconsistent w/ answers to yes/no questions, difficulty following one step instructions    Musculoskeletal Examination:      ROM Strength   RLE WFL 3/5   LLE WFL 3/5     Sensation: pt denies N/T  Coordination: ?WFL w/ decreased use of LUE    Functional Mobilty:  Rolling: max A    Supine to sit: max A  Scooting: max A  Sit to Supine: max A    Balance:  Static Sit:  fair    Endurance: fair    Participation:  Limited by difficulty following commands    Education:  Educated the patient to role of physical therapy, plan of care, goals  of therapy and safety with mobility and ADLs.    Assessment:  [name] is a 79 y.o. female admitted 01/07/2013.  Pt's functional mobility is impaired due to the following deficits:  Decreased strength, impaired balance and transfers, decreased activity tolerance, LUE pain, confusion, difficulty following instructions.  Pt would continue to benefit from PT to address these deficits and increase functional independence.     Rehabilitation Potential: good     Patient is in bed with Intravenous (IV), and call bell within reach. RN notified of session outcome.  Mobility status posted at bedside.    Plan:   Risks/Benefits/POC Discussed with Pt/Family: Patient unable to participate in goal setting  Treatment/Interventions: Exercise;Neuromuscular re-education;Functional transfer training;LE strengthening/ROM;Cognitive reorientation;Endurance training  PT Frequency: 2-3x/wk    Goals  Goal Formulation: Patient unable to participate in goal setting  Time for Goal Acheivement: 3 visits  Pt Will Go Supine To Sit: With moderate assist;to maximize functional mobility and independence  Pt Will Transfer Bed/Chair: With moderate assist;to maximize functional mobility and independence    D/C Suggestions:  SNF    Equipment Recommendation:  TBA     Signature:  Prince Solian, PT  01/08/2013  1:35 PM  Phone: [phone]

## 2013-01-08 NOTE — Progress Notes (Signed)
Patient very confuse this morning with slurred speech, not consistent with report. Fever of 101.1, MD notified. IVF and IV antibiotic in place. Tylenol Supp. Given. Patient weak, unable to tolerate diet for Breakfast and lunch. Swallows apple sauce and thin liquid ok, Patient now NPO, swallow evaluation to be done. Complains of pain to right arm, unable to lift or stretch. Patient states its her arthritis.     Patient more alert this evening, family updated about plan. Will continue to monitor    Blood sugar monitored.

## 2013-01-08 NOTE — Plan of Care (Signed)
Problem: Physical Therapy  Goal: Mobility level is maintained or improved  PT Plan  Treatment/Interventions: Exercise;Neuromuscular re-education;Functional transfer training;LE strengthening/ROM;Cognitive reorientation;Endurance training  Recommendation  Discharge Recommendation: SNF  PT Frequency: 2-3x/wk    Patient is agreeable to the following Goals:   Goals  Goal Formulation: Patient unable to participate in goal setting  Time for Goal Acheivement: 3 visits  Goals: Select goal  Pt Will Go Supine To Sit: With moderate assist;to maximize functional mobility and independence  Pt Will Transfer Bed/Chair: With moderate assist;to maximize functional mobility and independence  Outcome: Progressing  [name] is a 79 y.o. female admitted 01/07/2013.  Pt's functional mobility is impaired due to the following deficits:  Decreased strength, impaired balance and transfers, decreased activity tolerance, LUE pain, confusion, difficulty following instructions.  Pt would continue to benefit from PT to address these deficits and increase functional independence.

## 2013-01-08 NOTE — H&P (Signed)
ADMISSION HISTORY AND PHYSICAL EXAM    Date Time: 01/08/2013 10:53 AM  Patient Name: [name]  Attending Physician: Dorathy Daft, MD    Assessment:   Active Problems:   General weakness  General weakness   Hyponatremia   Dehydration   Facial droop/left arm weakness?tenderness , baseline unknown , speech unclear , new or old ?  Febrile illness , poss aspiration /uti   HTN,   HL,   DM,   mild aortic stenosis,   RA,   Chronic low back pain.   Severe lumbar spine stenosis.   S/p Epidural steroid injection at the L4-L5 under fluoroscopy guidance. 01/01/13  Aortic stenosis. Mild by echo in 1/13.     Plan:   Get the CT head stat .  neuro consulted   Tried to reach family . Try to reach the family again,   Follow on the urine culture .  Zosyn empirically to cover for aspiration as well   Pt eval ,OT eval   NPO for now / ST eval   Resume home meds for co morbidities     History of Present Illness:   [name] is a 79 y.o. female who presents to the hospital with   Reports generalized weakness for a few days, unable to get out of bed for the last few days per Ed record   Had the  Epidural steroid injection at the L4-L5 under fluoroscopy guidance. 01/01/13 per record   The patient is alert  with some forgetfulness. Patient had BP of 170/69,   Unable to elaborate on the hx , speech is not very clear , baseline unknown ,   Hx limited   No family here at present     History obtained by independently reviewing the previous available medical records, labs, microbiology data, imaging studies, as well as from the conversations with the patient , and nursing staff involved in this patient's care  Past Medical History:     Past Medical History   Diagnosis Date   . Diabetes mellitus without complication    . Hypertensive disorder    . Arthritis      rheumatoid       Past Surgical History:     Past Surgical History   Procedure Date   . Joint replacement 2005     BILATERAL THR   . Fracture surgery    . Eye surgery    .  Hysterectomy 1980'S   . Block, epidural steroid / lumb-sac 12/18/2012     Procedure: BLOCK, EPIDURAL STEROID / LUMB-SAC;  Surgeon: Caro Laroche, MD;  Location: [facility];  Service: Anesthesiology;  Laterality: N/A;   . Block, epidural steroid / lumb-sac 01/01/2013     Procedure: BLOCK, EPIDURAL STEROID / LUMB-SAC;  Surgeon: Caro Laroche, MD;  Location: [facility];  Service: Anesthesiology;  Laterality: N/A;       Family History:   History reviewed. No pertinent family history.    Social History:     History     Social History   . Marital Status: Widowed     Spouse Name: N/A     Number of Children: N/A   . Years of Education: N/A     Social History Main Topics   . Smoking status: Never Smoker    . Smokeless tobacco: Not on file   . Alcohol Use: No   . Drug Use: No   . Sexually Active:      Other Topics Concern   .  Not on file     Social History Narrative   . No narrative on file       Allergies:     Allergies   Allergen Reactions   . Azithromycin    . Dilaudid (Hydromorphone Hcl)    . Erythromycin    . Iodine      Iv contrast       Medications:     Prescriptions prior to admission   Medication Sig   . amLODIPine (NORVASC) 5 MG tablet Take 5 mg by mouth daily.   . insulin aspart (NOVOLOG) 100 UNIT/ML injection Inject into the skin 3 (three) times daily before meals. Sliding scale   . insulin glargine (LANTUS) 100 UNIT/ML injection Inject 15 Units into the skin nightly.   Marland Kitchen lisinopril (PRINIVIL,ZESTRIL) 2.5 MG tablet Take 1 tablet (2.5 mg total) by mouth daily.   Marland Kitchen acetaminophen (TYLENOL) 500 MG tablet Take 1,000 mg by mouth every 6 (six) hours as needed.   Marland Kitchen aspirin 81 MG tablet Take 81 mg by mouth daily.   . calcium-vitamin D (OSCAL-500) 500-200 MG-UNIT per tablet Take 1 tablet by mouth daily.   . cetirizine (ZYRTEC) 10 MG tablet Take 10 mg by mouth daily.   . furosemide (LASIX) 20 MG tablet Take 20 mg by mouth daily.    Marland Kitchen gabapentin (NEURONTIN) 600 MG tablet Take 600 mg by mouth 2 (two) times daily.   Marland Kitchen glipiZIDE (GLUCOTROL) 5 MG  tablet Take 5 mg by mouth 2 (two) times daily before meals.   . magnesium oxide (MAG-OX) 400 MG tablet Take 400 mg by mouth daily.   . methotrexate (RHEUMATREX) 2.5 MG tablet Take 7.5 mg by mouth every mon, wed and fri.   . naproxen (NAPROSYN) 500 MG tablet Take 500 mg by mouth 2 (two) times daily with meals.   Marland Kitchen oxyCODONE-acetaminophen (PERCOCET) 5-325 MG per tablet Take 1 tablet by mouth every 8 (eight) hours as needed.   . pantoprazole (PROTONIX) 40 MG tablet Take 40 mg by mouth daily.   . predniSONE (DELTASONE) 5 MG tablet Take 5 mg by mouth daily.   . sucralfate (CARAFATE) 1 G tablet Take 1 g by mouth daily.   . traMADol-acetaminophen (ULTRACET) 37.5-325 MG per tablet Take 1 tablet by mouth every 6 (six) hours as needed.   . vitamin B-12 (CYANOCOBALAMIN) 500 MCG tablet Take 500 mcg by mouth daily.       Review of Systems:   A comprehensive review of systems was:  .Review of Systems   Limited       Physical Exam:     Filed Vitals:    01/08/13 0940   BP: 148/68   Pulse: 76   Temp: 101 F (38.3 C)   Resp: 16   SpO2:      .     Intake and Output Summary (Last 24 hours) at Date Time    Intake/Output Summary (Last 24 hours) at 01/08/13 1053  Last data filed at 01/08/13 0655   Gross per 24 hour   Intake    652 ml   Output      2 ml   Net    650 ml       Physical Exam   Constitutional: Patient is oriented to person, Patient appears confused  No distress.   HENT:facial droop, new?  speech not clear   Head: Normocephalic and atraumatic.   Mouth/Throat: Oropharynx is clear    Eyes: EOM are normal. Pupils are equal, round,  and reactive to light.   Neck: Neck supple.   Cardiovascular: Normal rate and regular rhythm.    Pulmonary/Chest: Effort normal and breath sounds dec in bases  No respiratory distress.   Abdominal: Soft. Bowel sounds are normal.   Musculoskeletal: dec strength left arm ,     Neurological: Patient is alert       Labs:     Results     Procedure Component Value Units Date/Time    POCT glucose (AC and HS)  [[id number]]  (Abnormal) Collected:01/08/13 2142     POCT Glucose WB 255 (A) mg/dL XNATFTD:32/20/25 4270    Urine culture [[id number]] Collected:01/07/13 1327    Specimen Information:Urine / Urine, Catheterized, In & Out Updated:01/07/13 1830    POCT glucose (AC and HS) [[id number]]  (Abnormal) Collected:01/07/13 1815     POCT Glucose WB 187 (A) mg/dL XTGGYIR:48/54/62 7035    UA, Reflex to Microscopic [[id number]]  (Abnormal) Collected:01/07/13 1347    Specimen Information:Urine Updated:01/07/13 1406     Urine Type Catheterized, I      Color, UA Yellow      Clarity, UA Slightly Cloudy      Specific Gravity UA 1.005      Urine pH 8.0      Leukocyte Esterase, UA Negative      Nitrite, UA Negative      Protein, UA 100 (A)      Glucose, UA Negative      Ketones UA Negative      Urobilinogen, UA Normal mg/dL      Bilirubin, UA Negative      Blood, UA Small (A)      RBC, UA 0 - 5      WBC, UA 0 - 5      Urine Bacteria Rare     Troponin I [[id number]] Collected:01/07/13 1306    Specimen Information:Blood Updated:01/07/13 1343     Troponin I 0.02 ng/mL     Comprehensive Metabolic Panel (CMP) [[id number]]  (Abnormal) Collected:01/07/13 1306    Specimen Information:Blood Updated:01/07/13 1335     Glucose 222 (H) mg/dL      BUN 13 mg/dL      Creatinine 0.8 mg/dL      Sodium 025 (L)      Potassium 3.9      Chloride 90 (L)      CO2 28      CALCIUM 9.1 mg/dL      Protein, Total 8.1 g/dL      Albumin 3.2 (L) g/dL      AST (SGOT) 21 U/L      ALT 26 U/L      Alkaline Phosphatase 244 (H) U/L      Bilirubin, Total 0.6 mg/dL      Globulin 4.9 (H) g/dL      Albumin/Globulin Ratio 0.7 (L)     GFR [[id number]] Collected:01/07/13 1306     EGFR >60.0 Updated:01/07/13 1335    CBC and differential [[id number]]  (Abnormal) Collected:01/07/13 1306    Specimen Information:Blood / Blood Updated:01/07/13 1317     WBC 8.52      RBC 3.84 (L)      Hgb 11.7 (L) g/dL      Hematocrit 54.0 (L) %      MCV 92.4 fL      MCH 30.5 pg      MCHC 33.0 g/dL      RDW 15 %       Platelets 216      MPV 10.8  fL      Neutrophils 74 %      Lymphocytes Automated 15 %      Monocytes 10 %      Eosinophils Automated 0 %      Basophils Automated 1 %      Neutrophils Absolute 6.33      Abs Lymph Automated 1.26      Abs Mono Automated 0.84      Abs Eos Automated 0.04      Absolute Baso Automated 0.05             Lab 01/07/13 1306   WBC 8.52   HGB 11.7*   HCT 35.5*   PLT 216       Lab 01/07/13 1306   NA 131*   K 3.9   CL 90*   CO2 28   BUN 13   CREAT 0.8   EGFR >60.0   GLU 222*   CA 9.1           Rads:   Radiological Procedure reviewed. Radiology Results (24 Hour)     Procedure Component Value Units Date/Time    Chest AP Portable [[id number]] Collected:01/07/13 1338    Order Status:Completed  Updated:01/07/13 1345    Narrative:    Clinical history: Generalized weakness    TECHNIQUE: Plain film assessment of the chest was performed    FINDINGS: Chest film demonstrates pleural thickening in the upper  portion of the left hemithorax. Tortuous calcified thoracic aorta is  present. Lungs are clear. Shoulder arthritis is noted. Thoracic  arthritis is detected. Lumbar scoliosis concave to the right is noted.  The heart size appears to be normal.      Impression:      Chest demonstrates improvement in the opacities at the lung bases since  the recent prior film of 07/14/2012.  The lungs are clear.  Mild calcified aortic ectasia is detected. Some skeletal arthritis is  present.    Hal Hope, MD   01/07/2013 1:41 PM          Signed by: Dorathy Daft, MD

## 2013-01-08 NOTE — OT Eval Note (Signed)
[name]  [address]  [place], [place] [address]  [phone]    Occupational Therapy Evaluation    Patient: [name] MRN: [id number]   Unit: 3B ONCOLOGY GENERAL MEDICINE Bed: G295/M841-32    Time of treatment: Time Calculation  OT Received On: 01/08/13  Start Time: 1145  Stop Time: 1210  Time Calculation (min): 25 min    Consult received for [name] for OT evaluation and treatment.  Patient's medical condition is appropriate for Occupational Therapy  intervention at this time.    Medical Diagnosis: Hyponatremia [276.1]  General weakness [780.79]  General weakness    History of Present Illness: [name] is a 79 y.o. female admitted on  01/07/2013 with generalized weakness for a few days, unable to get out of bed for the last few days      Patient Active Problem List   Diagnosis   . Type II or unspecified type diabetes mellitus with neurological manifestations, not stated as uncontrolled(250.60)   . Atherosclerosis of native arteries of the extremities, unspecified   . Type II or unspecified type diabetes mellitus with neurological manifestations, not stated as uncontrolled(250.60)   . Chronic venous insufficiency   . CHF (congestive heart failure)   . HTN (hypertension)   . History of DVT of lower extremity   . GERD (gastroesophageal reflux disease)   . RA (rheumatoid arthritis)   . Varicose veins of lower extremities with ulcer   . Anemia   . Diverticulitis   . AKI (acute kidney injury)   . CKD (chronic kidney disease), stage III   . Chest pain   . General weakness     Past Medical History   Diagnosis Date   . Diabetes mellitus without complication    . Hypertensive disorder    . Arthritis      rheumatoid     Past Surgical History   Procedure Date   . Joint replacement 2005     BILATERAL THR   . Fracture surgery    . Eye surgery    . Hysterectomy 1980'S   . Block, epidural steroid / lumb-sac 12/18/2012     Procedure: BLOCK, EPIDURAL STEROID / LUMB-SAC;  Surgeon: Caro Laroche,  MD;  Location: [facility];  Service: Anesthesiology;  Laterality: N/A;   . Block, epidural steroid / lumb-sac 01/01/2013     Procedure: BLOCK, EPIDURAL STEROID / LUMB-SAC;  Surgeon: Caro Laroche, MD;  Location: [facility];  Service: Anesthesiology;  Laterality: N/A;     Precautions:   Fall   Contact isolation     X-Rays/Tests/Labs:  Results for HANNA, RA (MRN [id number]) as of 01/08/2013 13:36    Ref. Range  01/07/2013 13:06    Hemoglobin  Latest Range: 12.0-16.0 g/dL  53.6 (L)    Hematocrit  Latest Range: 37.0-47.0 %  35.5 (L)    Chest AP Portable (Order #[id number]) on 01/07/2013 - Imaging Information   IMPRESSION:   Chest demonstrates improvement in the opacities at the lung bases since   the recent prior film of 07/14/2012.   The lungs are clear.   Mild calcified aortic ectasia is detected. Some skeletal arthritis is   present.    Social History:  Pt is a poor historian. Social history unclear. Per pt she amb w/ AD and requires assist for ADLs and mobility.    Subjective: Patient is agreeable to participation in the therapy session. Nursing clears patient for therapy.  Patient's Goal: none stated  Pain:  pt denied pain at rest. Pt grimaces when LUE is touched or moved (unable to specify exact location or assign number)    Objective:  Patient is in bed with Intravenous (IV) in place.    Observation of patient/vitals:   Filed Vitals:    01/07/13 2007 01/07/13 2224 01/08/13 0655 01/08/13 0940   BP: 190/83 173/77 140/62 148/68   Pulse: 80 72 82 76   Temp: 98.4 F (36.9 C)  98.9 F (37.2 C) 101 F (38.3 C)   TempSrc: Oral  Oral Oral   Resp: 20  20 16    Height:       Weight:       SpO2: 99%  98%        Orientation/Cognition:     Alert and Oriented x self only  Cognition: Inconsistent with following commands and answering questions (yes/no and multiple choice questions). Requires increased time and repetition for majority of commands/questions.     Musculoskeletal Examination:          ROM Strength   RUE WFL to 90 degrees  shoulder flex/abd 3+/5   LUE PROM is WFL. Limited AROM to hand, wrist, and elbow. Very limited shoulder flex/abd.   (appears due to pain. Pt may have also had difficulty following commands).  Grip: 3+/5   2/5 for LUE      Sensation: appears intact to light touch BUE's   Coordination: FMC impaired to BUE's. GMC impaired to LUE.   Vision: appears [facility]   Hearing: WFL     Functional Mobilty:  Rolling: Max A (bilaterally)     Scooting: Max A  Supine to sit: Max A  Sit to Supine: Max A    (further mobility NT secondary to fatigue)    Balance:  Static Sit Balance: good  Dynamic Sit Balance: fair+  Static Stand Balance: NT  Dynamic Stand Balance: NT    Self Care:  Eating: Min A  Grooming: Max A  Bathing: UE-max A. LE-dependent.   UB Dressing: dependent  LB Dressing: dependent  Toileting: dependent    Endurance: fair    Participation:  Good     Education:  Educated the patient to role of occupational therapy, plan of care, goals  of therapy and HEP, safety with mobility and ADLs.    Assessment:  [name] is a 79 y.o. female admitted 01/07/2013. Pt's ability to complete ADLs and functional transfers is impaired due to the following deficits: cognition (inconsistency following commands), activity tolerance, strength, decreased AROM to LUE/GMC deficits to LUE, FMC deficits to BUE's, as well as dynamic sitting balance. Pt would continue to benefit from OT to address these deficits and increase independence with ADLs and functional transfers.     Rehabilitation Potential:   good     Patient is in bed with Intravenous (IV), and call bell within reach. RN notified of session outcome.  Mobility and ADL status posted at bedside.    Goals:  Goal Formulation: Patient  Time For Goal Achievement: 5 visits  Goals: Select goal    ADL Goals  Patient will feed self: independently  Patient will groom self: with minimal assist  Patient will dress upper body: with minimal assist  Pt will complete bathing: With minimal assist (UE  bathing)    Mobility and Transfer Goals  Pt will transfer bed to [facility]: with moderate assist    Musculoskeletal Goals  Pt will perform Home Exercise Program: independently;to increase engagement in ADLs    OT Plan  Treatment Interventions: ADL retraining;Functional transfer training;UE strengthening/ROM;Endurance training;Cognitive reorientation;Patient/Family training;Equipment eval/education;Compensatory technique education;Fine motor coordination activities;Continued evaluation  OT Frequency Recommended: 2-3x/wk    D/C Suggestions:  Discharge Recommendation: SNF  DME Recommended for Discharge:  (TBA)    Signature:   Louie Boston, OT  01/08/2013  1:45 PM  Phone: [phone]

## 2013-01-08 NOTE — Plan of Care (Signed)
Patient is [name]. No sign and symptom of acute distress. Denies pain. complain of weakness. No family member by the bedside during admission. Patient does not remember the phone number. She is unsure about her home medication. patient stated that she will provide [place] list of home med and medical POA tomorrow when the family member visit her. Patient refused to wear SCD's. On contact isolation for MDR. IV access was infiltrated. New IV access on Rt AC. IV fluid started. BP was 194/83. Patient had just received Norvasc  During change of shift. Rechecked BP was 178/77. Lisinopril 2.5mg  PO given. Blood sugar was 255. 2 units coverage given. patietn turned and reposition. Has stage @ ulcer on left upper inner bottock. mepilex applied. Patient has upper and lower dentures. IS able to chew and swallow food without difficulty. Will continue to monitor blood pressure.

## 2013-01-08 NOTE — Consults (Signed)
Service Date: 01/08/2013     Patient Type: I     CONSULTING PHYSICIAN: Sharnese Heath Leia Alf MD     REFERRING PHYSICIAN: Dorathy Daft MD     CONSULTING SERVICE:  Pain Management     REASON FOR CONSULTATION:  Evaluation and management of weakness and pain.     HISTORY OF PRESENT ILLNESS:  The patient is a 79 year old African-American woman with known rheumatoid  arthritis, diabetes, and hypertension, and she was here the day before this  admission for nerve block, was taken back to [facility] where  she lives and then started to have general weakness, anorexia, and was  brought to the emergency room and then admitted due to uncontrolled  diabetes and hyponatremia.  The patient herself has difficulty recalling  words and therefore, history was obtained from her medical records.  I also  talked to one of her friends and Verlan Friends who confirmed that he  brought the patient the day prior to this admission to anesthesia for nerve  block.     REVIEW OF SYSTEMS:  Otherwise, is not available.     PAST MEDICAL HISTORY:  Significant for diabetes, hypertension, arthritis most likely rheumatoid.     PAST SURGICAL HISTORY:  Eye surgery, hysterectomy, surgery for fracture, epidural steroid blocks  several times; the patient also has GERD, vitamin D deficiency and allergic  rhinitis.     FAMILY HISTORY:  Not available.     SOCIAL HISTORY:  She resides at [facility].  She has been widowed.  Does not  smoke, does not drink.  There are several friends or niece numbers on the  board one of them is [phone] another one is [phone].       ALLERGIES:  AZITHROMYCIN, DILAUDID, ERYTHROMYCIN, and IODINE.     MEDICATIONS:  Prior to admission were amlodipine, insulin, lisinopril, Tylenol, Os-Cal,  Zyrtec, Lasix, Glucotrol, magnesium oxide, methotrexate and Naprosyn.     CURRENT MEDICATIONS:  Amlodipine, aspirin, gabapentin, lisinopril, pantoprazole,,  prednisone, sucralfate and vitamin B12.  She also  gets as needed Percocet,  and Ultram.     PHYSICAL EXAMINATION:  GENERAL:  At present shows the patient to be awake and alert, difficult to  check for orientation as she has difficulty getting words out.  VITAL SIGNS:  Temperature 101, pulse 76, respirations 16, blood pressure  148/68, oxygen saturation 98%.  HEENT:  Shows food particles in the mouth.  NECK:  Without any JVD or adenopathy.  There is definite temporal wasting  and wasting of muscles in the neck, some of the other neck muscles are very  tight.  LUNGS:  Show diminished air entry at both bases.  HEART:  Sounds are normal.  ABDOMEN:  Soft and nontender.  Liver and spleen not palpable.  No other  masses palpable.  Bowel sounds present.  EXTREMITIES:  Show no edema.  Examination of the back and skin shows a  large dressing in the coccygeal area.  I did not examine the wound.  NEUROLOGIC: There is definitely a recall deficit, cognitive deficit and  severe generalized weakness.  There are mild deformities of her hands and  fingers.     LABORATORY DATA:  The following investigations have been done so far.  White blood count is  8.52, hemoglobin 11.7, and platelets 10.8, glucose 222.  Sodium 131,  chloride 90, albumin 3.2, alkaline phosphatase 244 and globulins are 4.9.   Urine did not show any leukocyte esterase, but  had some proteins and small  amount of blood.  Her chest x-ray showed improvement in the opacities in  the bases of the lungs as compared to x-ray from March, shoulder arthritis,  thoracic arthritis and lumbar scoliosis were noted, mildly calcified aortic  ectasia was also noted.     IMPRESSION:  1.  Pain, most likely related to rheumatoid arthritis and her spinal  degenerative joint disease for which she receives nerve blocks or epidural  blocks.  2.  Anorexia, interestingly, there has been a 27 pound weight loss since  November 2013 from 147 to 120 and the patient looks cachectic.  3.  Rheumatoid arthritis.  4.  Dementia FAST score of 60 to  70.  5.  History of diabetes and hypertension.    6.  Fever, most likely sepsis, aspiration pneumonia is likely.     SUGGESTIONS:  I have tried calling several numbers and left messages for her friends or  relatives to find out power of attorney.  I will have family meeting with  them.  I reviewed her old records and Dr. Isidor Holts had a conversation with  the patient in November of 2013 when the patient had expressed desire not  to be resuscitated in case of cardiac arrest.  I will confirm that with her  power of attorney first.           D:  01/08/2013 13:13 PM by Dr. Marney Setting, MD [phone])  T:  01/08/2013 14:47 PM by       Everlean Cherry: [id number]) (Doc ID: [id number])

## 2013-01-08 NOTE — Plan of Care (Signed)
Problem: Occupational Therapy  Goal: Self-care - activities of daily living  Ability to perform basic physical tasks and personal care activities.  OT Plan  Treatment Interventions: ADL retraining;Functional transfer training;UE strengthening/ROM;Endurance training;Cognitive reorientation;Patient/Family training;Equipment eval/education;Compensatory technique education;Fine motor coordination activities;Continued evaluation  Discharge Recommendation: SNF  DME Recommended for Discharge: (TBA)  OT Frequency Recommended: 2-3x/wk    Patient is agreeable to the following Goals:   Goal Formulation: Patient  Time For Goal Achievement: 5 visits  ADL Goals  Patient will feed self: independently  Patient will groom self: with minimal assist  Patient will dress upper body: with minimal assist  Pt will complete bathing: With minimal assist (UE bathing)  Mobility and Transfer Goals  Pt will transfer bed to [facility]: with moderate assist    Musculoskeletal Goals  Pt will perform Home Exercise Program: independently;to increase engagement in ADLs  Outcome: Progressing   Pt's ability to complete ADLs and functional transfers is impaired due to the following deficits: cognition (inconsistency following commands), activity tolerance, strength, decreased AROM to LUE/GMC deficits to LUE, FMC deficits to BUE's, as well as dynamic sitting balance. Pt would continue to benefit from OT to address these deficits and increase independence with ADLs and functional transfers.

## 2013-01-09 LAB — POCT GLUCOSE
Whole Blood Glucose POCT: 240 mg/dL — AB (ref 70–100)
Whole Blood Glucose POCT: 153 mg/dL — AB (ref 70–100)
Whole Blood Glucose POCT: 240 mg/dL — AB (ref 70–100)
Whole Blood Glucose POCT: 377 mg/dL — AB (ref 70–100)

## 2013-01-09 LAB — BASIC METABOLIC PANEL
BUN: 11 mg/dL (ref 7–21)
CO2: 22 (ref 22–29)
Calcium: 7.9 mg/dL (ref 7.9–10.6)
Chloride: 95 — ABNORMAL LOW (ref 98–107)
Creatinine: 0.8 mg/dL (ref 0.6–1.0)
Glucose: 350 mg/dL — ABNORMAL HIGH (ref 70–100)
Potassium: 3.7 (ref 3.5–5.1)
Sodium: 129 — ABNORMAL LOW (ref 136–145)

## 2013-01-09 LAB — HEMOLYSIS INDEX: Hemolysis Index: 148 — ABNORMAL HIGH (ref 0–9)

## 2013-01-09 LAB — VITAMIN B12: Vitamin B-12: >2000 pg/mL — ABNORMAL HIGH (ref 211–911)

## 2013-01-09 LAB — GFR: EGFR: >60

## 2013-01-09 MED ORDER — INSULIN GLARGINE 100 UNIT/ML SC SOLN
10.0000 [IU] | Freq: Every evening | SUBCUTANEOUS | Status: DC
Start: 2013-01-09 — End: 2013-01-16
  Administered 2013-01-09 – 2013-01-15 (×7): 10 [IU] via SUBCUTANEOUS
  Filled 2013-01-09 (×9): qty 100

## 2013-01-09 MED ORDER — HYDRALAZINE HCL 25 MG PO TABS
25.0000 mg | ORAL_TABLET | ORAL | Status: DC | PRN
Start: 2013-01-09 — End: 2013-01-13
  Administered 2013-01-09: 25 mg via ORAL
  Filled 2013-01-09: qty 1

## 2013-01-09 MED ORDER — NAPROXEN 250 MG PO TABS
500.0000 mg | ORAL_TABLET | Freq: Two times a day (BID) | ORAL | Status: DC
Start: 2013-01-09 — End: 2013-01-14
  Administered 2013-01-09 – 2013-01-14 (×9): 500 mg via ORAL
  Filled 2013-01-09 (×9): qty 2

## 2013-01-09 NOTE — PT Progress Note (Signed)
[facility]  [address]  [place] [address]  [phone]    Physical Therapy Treatment    Patient:  [name]        MRN#:  [id number]  Unit:  3B ONCOLOGY GENERAL MEDICINE        Room/Bed:  I696/E952-84    Medical Diagnosis: Hyponatremia [276.1]  General weakness [780.79]  General weakness    Time of treatment:  PT Received On: 01/09/13  Start Time: 0945 Stop Time: 1025  Time Calculation (min): 40 min    Treatment #: PT Visit Number: 1/3    Patient's medical condition is appropriate for Physical Therapy intervention at this time.    Precautions: falls, contact isolation  Subjective: "I'm so cold."  Patient is agreeable to participation in the therapy session. Nursing clears patient for therapy.  Pain: pt c/o L elbow pain, does not assign number.    Vitals:   Filed Vitals:    01/08/13 1505 01/08/13 2030 01/09/13 0545 01/09/13 0740   BP: 141/57 140/64 210/83 185/80   Pulse: 76 79 68    Temp: 101.2 F (38.4 C) 98.7 F (37.1 C) 97.3 F (36.3 C)    Resp: 22 18 20 20    SpO2: 100% 97% 99%      Objective:  Pt received supine in bed w/ IV, telemetry, SCDs intact.      Functional Mobility:  Supine to Sit: min A w/ HOB elevated  Scooting:  Mod A seated EOB  Sit to Stand: mod A w/ bed slight elevated x 3  Stand to Sit: mod A x 2 to bed, x 1 to bedside chair  Transfer Training: max A for SPT from bed to bedside chair    Pt performed UB/LB dressing w/ max A.    Balance Training:  Poor standing balance    Educated the patient to role of physical therapy, plan of care, goals  of therapy and safety with mobility and ADLs.    Patient left in bedside chair with all needs met, equipment intact and call bell within reach. RN notified of session outcome.  Updated mobility status posted at bedside.    Assessment:  Pt w/ improved cognition and functional mobility today.  Continues w/ L elbow pain.  Requires increased time for all activities.  Multiple rest breaks 2* c/o fatigue.  Pt is pleasant and  cooperative.  Pt will continue to benefit from PT services while admitted to address functional deficits.     Goals per Eval/ Re-eval:   Goal Formulation: Patient unable to participate in goal setting  Time for Goal Acheivement: 3 visits  Pt Will Go Supine To Sit: With moderate assist;to maximize functional mobility and independence  Pt Will Transfer Bed/Chair: With moderate assist;to maximize functional mobility and independence    Plan:  Discharge Recommendation: SNF  PT Frequency: 2-3x/wk    Continue plan of care.    Signature:  Prince Solian, PT  01/09/2013 10:25 AM   Phone: [phone]

## 2013-01-09 NOTE — Progress Notes (Signed)
PROGRESS NOTE    Date Time: 01/09/2013 10:09 AM  Patient Name: [name],[name]      Assessment:   1. Admitted with Generalized  weakness   2. Hyponatremia , BMP pending   3. Dehydration improving   4. Febrile illness , resolving  / urine cx was negative,  poss aspiration, repeat CXR , continue zosyn for now, arthritis flare up ?  5. Slurred speech- improved since yesterday,  CT head did not show any acute infarct, Neurology consulted , further work up per neuro   6. Left arm weakness vs sec to  elbow arthritic pain , symptomatic treatment , follow on neuro work up   7. HTN,   8. HL,   9. DM, uncontrolled , start lantus , continue ssi   10. mild aortic stenosis,  Mild by echo in 1/13.  11. RA, discussed with neuro , ok with naproxen to start   12. Chronic low back pain.   13. Severe lumbar spine stenosis.   14. S/p Epidural steroid injection at the L4-L5 under fluoroscopy guidance. 01/01/13         neuro consulted , follow on neuro work up   Zosyn empirically to cover for aspiration as well   asp precaution , follow on cxr in am   Follow lytes   Pt eval ,OT eval   Resumed home meds for co morbidities   ssi , added lantus , follow up in am     Patient Active Problem List   Diagnosis   . Type II or unspecified type diabetes mellitus with neurological manifestations, not stated as uncontrolled(250.60)   . Atherosclerosis of native arteries of the extremities, unspecified   . Type II or unspecified type diabetes mellitus with neurological manifestations, not stated as uncontrolled(250.60)   . Chronic venous insufficiency   . CHF (congestive heart failure)   . HTN (hypertension)   . History of DVT of lower extremity   . GERD (gastroesophageal reflux disease)   . RA (rheumatoid arthritis)   . Varicose veins of lower extremities with ulcer   . Anemia   . Diverticulitis   . AKI (acute kidney injury)   . CKD (chronic kidney disease), stage III   . Chest pain   . General weakness         Subjective:   Sitting in chair , feels  better , speech sl more clear     Medications:     Current Facility-Administered Medications   Medication Dose Route Frequency   . amLODIPine  5 mg Oral Daily   . aspirin  81 mg Oral Daily   . gabapentin  400 mg Oral BID   . lisinopril  2.5 mg Oral Daily   . pantoprazole  40 mg Oral QAM AC   . piperacillin-tazobactam  4.5 g Intravenous Q8H SCH   . predniSONE  5 mg Oral QAM W/BREAKFAST   . sucralfate  1 g Oral Daily   . vitamin B-12  500 mcg Oral Daily       Review of Systems:   A comprehensive review of systems was:           Physical Exam:     Filed Vitals:    01/09/13 0740   BP: 185/80   Pulse:    Temp:    Resp: 20   SpO2:          Intake and Output Summary (Last 24 hours) at Date Time  No intake or output data  in the 24 hours ending 01/09/13 1009    Physical Exam   Constitutional: Patient is oriented to person, Patient appears less confused No distress.   HENT: speech more clear today   Head: Normocephalic and atraumatic.   Mouth/Throat: Oropharynx is clear   Eyes: EOM are normal. Pupils are equal, round, and reactive to light.   Neck: Neck supple.   Cardiovascular: Normal rate and regular rhythm.   Pulmonary/Chest: Effort normal and breath sounds dec in bases No respiratory distress.   Abdominal: Soft. Bowel sounds are normal.   Neurological: Patient is alert , dec strength left arm , follows commands             Labs:     Results     Procedure Component Value Units Date/Time    POCT glucose (AC and HS) [[id number]]  (Abnormal) Collected:01/09/13 0707    Specimen Information:Blood Updated:01/09/13 0828     POCT Glucose WB 240 (A) mg/dL     POCT glucose (AC and HS) [[id number]]  (Abnormal) Collected:01/08/13 2112     POCT Glucose WB 256 (A) mg/dL BMWUXLK:44/01/02 7253    POCT glucose (AC and HS) [[id number]]  (Abnormal) Collected:01/08/13 1641     POCT Glucose WB 309 (A) mg/dL QVZDGLO:75/64/33 2951    Urine culture [[id number]] Collected:01/07/13 1327    Specimen Information:Urine / Urine, Catheterized, In & Out  Updated:01/08/13 1644    Narrative:    ORDER#: [id number]                                    ORDERED BY: Margarite Gouge  SOURCE: Urine, Catheterized, In & Out                COLLECTED:  01/07/13 13:27  ANTIBIOTICS AT COLL.:                                RECEIVED :  01/07/13 18:30  Culture Urine                              FINAL       01/08/13 16:44  01/08/13   No growth of >1,000 CFU/ML, No further work      CULTURE BLOOD AEROBIC AND ANAEROBIC [[id number]] Collected:01/08/13 1154    Specimen Information:Blood, Venipuncture Updated:01/08/13 1529    POCT glucose (AC and HS) [[id number]]  (Abnormal) Collected:01/08/13 1231     POCT Glucose WB 197 (A) mg/dL SEGBTDV:76/16/07 3710            Lab 01/07/13 1306   WBC 8.52   HGB 11.7*   HCT 35.5*   PLT 216       Lab 01/07/13 1306   NA 131*   K 3.9   CL 90*   CO2 28   BUN 13   CREAT 0.8   EGFR >60.0   GLU 222*   CA 9.1           Rads:   Radiological Procedure reviewed.  Radiology Results (24 Hour)     Procedure Component Value Units Date/Time    CT Head WO Contrast [[id number]] Collected:01/08/13 1618    Order Status:Completed  Updated:01/08/13 1633    Narrative:    History stroke    TECHNIQUE: Cranial CT scan was performed without contrast. The cranial  CT was compared  with the prior examination performed on 05/31/2012.    FINDINGS: Age-related intracranial involutional changes are present.  Small vessel ischemic changes are present within the centrum semiovale.  The base of the skull and mastoid air cells appear to be normal.    No extracerebral fluid collection is detected. No intracranial  hemorrhage is identified.  The views of the paranasal sinuses appear to be unremarkable.        Impression:    Impression:  Intracranially age-related involutional changes are present.  Small vessel ischemic changes are present in the centrum semiovale.  No intracranial acute process is detected.    Hal Hope, MD   01/08/2013 4:29 PM            Signed by: Dorathy Daft

## 2013-01-09 NOTE — Consults (Signed)
CONSULTATION    Date Time: 01/09/2013 11:11 AM  Patient Name: ROBI, MITTER  Requesting Physician: Dorathy Daft, MD      Reason for Consultation:   Generalized weakness    Assessment and plan   1) Acute confusional state and generalized weakness:   had difficulty getting out of bed. At baseline she is having weakness in left side because of RA.  She was confused and had speech difficulty. Patient improved today however not back yet to baseline.   Patient spiked fever that might suggest infectious process.   Plan:  MRI of the brain to rule out any intracranial pathology        History:   ARRAYAH CONNORS is a 79 y.o. female with PMH Of HTN,DM and RA was admitted because of episode of confusion and generalized weakness. Patient stated that she had difficulty getting out of bed. There was nobody around so she stayed  For long time trying to call people. When she arrived to hospital she was mumbling and incoherent , she found to be weaker on the left side. She spiked fever up to 101. She denied andy new weakness or numbness. Feels slight headache. No visual disturbance. No hx of seizure.    Past Medical History:     Past Medical History   Diagnosis Date   . Diabetes mellitus without complication    . Hypertensive disorder    . Arthritis      rheumatoid       Past Surgical History:     Past Surgical History   Procedure Date   . Joint replacement 2005     BILATERAL THR   . Fracture surgery    . Eye surgery    . Hysterectomy 1980'S   . Block, epidural steroid / lumb-sac 12/18/2012     Procedure: BLOCK, EPIDURAL STEROID / LUMB-SAC;  Surgeon: Caro Laroche, MD;  Location: [facility];  Service: Anesthesiology;  Laterality: N/A;   . Block, epidural steroid / lumb-sac 01/01/2013     Procedure: BLOCK, EPIDURAL STEROID / LUMB-SAC;  Surgeon: Caro Laroche, MD;  Location: [facility];  Service: Anesthesiology;  Laterality: N/A;       Family History:   History reviewed. No pertinent family history.    Social History:     History     Social  History   . Marital Status: Widowed     Spouse Name: N/A     Number of Children: N/A   . Years of Education: N/A     Social History Main Topics   . Smoking status: Never Smoker    . Smokeless tobacco: Not on file   . Alcohol Use: No   . Drug Use: No   . Sexually Active:      Other Topics Concern   . Not on file     Social History Narrative   . No narrative on file       Allergies:     Allergies   Allergen Reactions   . Azithromycin    . Dilaudid (Hydromorphone Hcl)    . Erythromycin    . Iodine      Iv contrast       Medications:     Current Facility-Administered Medications   Medication Dose Route Frequency   . amLODIPine  5 mg Oral Daily   . aspirin  81 mg Oral Daily   . gabapentin  400 mg Oral BID   . lisinopril  2.5 mg Oral Daily   .  pantoprazole  40 mg Oral QAM AC   . piperacillin-tazobactam  4.5 g Intravenous Q8H SCH   . predniSONE  5 mg Oral QAM W/BREAKFAST   . sucralfate  1 g Oral Daily   . vitamin B-12  500 mcg Oral Daily       Review of Systems:   General ROS: negative for - fever   Respiratory ROS: negative for - cough or shortness of breath   Cardiovascular ROS: Negative for chest pain   Gastrointestinal ROS: Negative for - abdominal pain        Physical Exam:     Filed Vitals:    01/09/13 0740   BP: 185/80   Pulse:    Temp:    Resp: 20   SpO2:        Patient is alert and oriented to month (sebtemper) Year (2014) day (Thursday) she was able to tell her full name and date of birth. President (obama). Looks tired, sitting in a chair.  Follows commands  Speech: Fluent,  normal comprehension,able to name and repeat.  CN: PERLA,EOMI,No facial asymmetry. Tongue is midline.  Motor  Observation: No atrophy or fasciculation  Tone: WNL B/L  Power: No pronation drift. Had joint deformity because of RA,  Power -5/5 in the right U/E and +4/5 in the left  Reflexes is 0 all over   Planter response: dawn going B/L   Sensation: intact to light touch  Coordination: Normal F-N testing B/L. Heel to shin test WNL  Gait was  not tested         Labs Reviewed:     Results     Procedure Component Value Units Date/Time    POCT glucose (AC and HS) [[id number]]  (Abnormal) Collected:01/09/13 0707    Specimen Information:Blood Updated:01/09/13 0828     POCT Glucose WB 240 (A) mg/dL     POCT glucose (AC and HS) [[id number]]  (Abnormal) Collected:01/08/13 2112     POCT Glucose WB 256 (A) mg/dL NWGNFAO:13/08/65 7846    POCT glucose (AC and HS) [[id number]]  (Abnormal) Collected:01/08/13 1641     POCT Glucose WB 309 (A) mg/dL LKGMWNU:27/25/36 6440    Urine culture [[id number]] Collected:01/07/13 1327    Specimen Information:Urine / Urine, Catheterized, In & Out Updated:01/08/13 1644    Narrative:    ORDER#: [id number]                                    ORDERED BY: Margarite Gouge  SOURCE: Urine, Catheterized, In & Out                COLLECTED:  01/07/13 13:27  ANTIBIOTICS AT COLL.:                                RECEIVED :  01/07/13 18:30  Culture Urine                              FINAL       01/08/13 16:44  01/08/13   No growth of >1,000 CFU/ML, No further work      CULTURE BLOOD AEROBIC AND ANAEROBIC [[id number]] Collected:01/08/13 1154    Specimen Information:Blood, Venipuncture Updated:01/08/13 1529    POCT glucose (AC and HS) [[id number]]  (Abnormal) Collected:01/08/13 1231     POCT Glucose WB 197 (A) mg/dL  Updated:01/08/13 1237              Rads:   Radiological Procedure reviewed.     Signed by: Pryor Montes

## 2013-01-09 NOTE — Progress Notes (Signed)
Palliative Care Progress Note - [facility]    Date Time: 01/09/2013 1:38 PM  Patient Name: [name]  Attending Physician: Dorathy Daft, MD    Assessment/Plan:   Pain -at times from arthritis, more in the left hand, also back    [facility] complaint, generalized    Slurred speech- improved since yesterday, Neurology consult pending, CT head did not show any acute infarct.    RA, mild dementia, DM, HTN    Fever-? aspiration pneumonia    Advance Care Plan -I had a phone comnversation with her POA yesterday as patient had difficulty with words and was not as alert as today. According to her niece/POA-[name], she would want to do "everything" until she has chance to come here from [place]. If patient's mental status continues to improve, we should be able to ask her about code status soon.    Disposition -unclear at this time    Case was discussed with Dr. Lorel Monaco.    Chief Complaint:   pain    Interval History:   As above-mostly pain is in the left hand and back. She is unable to use her left hand due to RA. No dysphagia.    Past Medical History:     Past Medical History   Diagnosis Date   . Diabetes mellitus without complication    . Hypertensive disorder    . Arthritis      rheumatoid       Social History:   Code status: Prior     Allergies:     Allergies   Allergen Reactions   . Azithromycin    . Dilaudid (Hydromorphone Hcl)    . Erythromycin    . Iodine      Iv contrast       Medications:   Medication list/MAR reviewed.    Review of Systems:   Negative other than described above.    Physical Exam:   VS:   Filed Vitals:    01/09/13 0740   BP: 185/80   Pulse:    Temp:    Resp: 20   SpO2:      GENERAL: Appears in no obvious distress.  EYES:  Sclera anicteric. Conjunctivae pink.  ENT: Oral mucosa moist.  NECK: Trachea midline. Neck veins flat. No adenopathy.  HEART: RRR. Normal S1, S2. No murmur appreciated.  CHEST: Breath sounds are decreased bilaterally.  ABDOMEN: Soft. Non-tender. Non-distended.  BS+.  GU: Deferred.  RECTAL: Deferred.  EXTREMITIES: No edema. No clubbing. No cyanosis. Deformities of hands/fingers  SKIN: No rash or lesion.  PSYCH: Alert. Oriented x 3. Appropriate.  NEURO: Moves all extremities. No focal weakness.      Labs/Diagnostics:   CT head-age related changes, no acute intracranial process  Wt Readings from Last 5 Encounters:   01/07/13 54.432 kg (120 lb)   12/18/12 58.06 kg (128 lb)   12/18/12 58.06 kg (128 lb)   07/14/12 58.469 kg (128 lb 14.4 oz)   05/31/12 62.869 kg (138 lb 9.6 oz)       Time in: 13:00/Time out: 13:25. More than 50% of this visit was spent in face to face consultation and discussion of diagnosis, prognosis, and treatment options.  Signed by: Park Breed, MD  [facility]  [phone]

## 2013-01-09 NOTE — Progress Notes (Signed)
Unable to contact family.  Message left to call me.  Per chart, pt appears to live at the [facility] house and has an aid to assist in her care.  Unsure of how long the aid is with pt.  Palliative care is involved with pt an has spoken to the family,  Will cont trying to contact the family.  Anticipate pt will need additional services and/or snf at time of discharge

## 2013-01-09 NOTE — Plan of Care (Signed)
Pt alert and sometimes confused. No complain of pain. Pt had dysphagia pureed thin diet and tolerated well. Pt can talk slowly. Family member was at bedside in the morning. Talked nephew on the phone , Ricky [phone], regarding MRI brain and he will come tomorrow morning. Will continue to plan of care.

## 2013-01-09 NOTE — Plan of Care (Signed)
Problem: Physical Therapy  Goal: Mobility level is maintained or improved  PT Plan  Treatment/Interventions: Exercise;Neuromuscular re-education;Functional transfer training;LE strengthening/ROM;Cognitive reorientation;Endurance training  Recommendation  Discharge Recommendation: SNF  PT Frequency: 2-3x/wk    Patient is agreeable to the following Goals:   Goals  Goal Formulation: Patient unable to participate in goal setting  Time for Goal Acheivement: 3 visits  Goals: Select goal  Pt Will Go Supine To Sit: With moderate assist;to maximize functional mobility and independence  Pt Will Transfer Bed/Chair: With moderate assist;to maximize functional mobility and independence   Outcome: Progressing  Pt w/ improved cognition and functional mobility today.  Continues w/ L elbow pain.  Requires increased time for all activities.  Multiple rest breaks 2* c/o fatigue.  Pt is pleasant and cooperative.  Pt will continue to benefit from PT services while admitted to address functional deficits.

## 2013-01-09 NOTE — Progress Notes (Signed)
BP this morning 210/83,HO notified,got order for Hydralazine 25 mg. Po PRN,first dose given at 0615.Will continue to monitor.Denies headache,no CP or SOB.

## 2013-01-09 NOTE — SLP Eval Note (Signed)
[facility]  [address]  [place] [address]  [phone]    Speech and Language Therapy Bedside Swallow Evaluation     Patient: [name]    MRN#: [id number]   Unit: 3B ONCOLOGY GENERAL MEDICINE Bed: Y073/X106-26    Date/Time of Treatment:   Time Calculation  SLP Received On: 01/09/13  Start Time: 0645  Stop Time: 0745  Time Calculation (min): 60 min    Consult received for [name] for SLP Bedside Swallow Evaluation and Treatment.    Medical Diagnosis: Hyponatremia [276.1]  General weakness [780.79]  General weakness    History of Present Illness: [name] is a 79 y.o. female admitted on 01/07/2013 with hyponatremia, weakness.  Patient Active Problem List   Diagnosis   . Type II or unspecified type diabetes mellitus with neurological manifestations, not stated as uncontrolled(250.60)   . Atherosclerosis of native arteries of the extremities, unspecified   . Type II or unspecified type diabetes mellitus with neurological manifestations, not stated as uncontrolled(250.60)   . Chronic venous insufficiency   . CHF (congestive heart failure)   . HTN (hypertension)   . History of DVT of lower extremity   . GERD (gastroesophageal reflux disease)   . RA (rheumatoid arthritis)   . Varicose veins of lower extremities with ulcer   . Anemia   . Diverticulitis   . AKI (acute kidney injury)   . CKD (chronic kidney disease), stage III   . Chest pain   . General weakness        Past Medical/Surgical History:  Past Medical History   Diagnosis Date   . Diabetes mellitus without complication    . Hypertensive disorder    . Arthritis      rheumatoid      Past Surgical History   Procedure Date   . Joint replacement 2005     BILATERAL THR   . Fracture surgery    . Eye surgery    . Hysterectomy 1980'S   . Block, epidural steroid / lumb-sac 12/18/2012     Procedure: BLOCK, EPIDURAL STEROID / LUMB-SAC;  Surgeon: Caro Laroche, MD;  Location: [facility];  Service: Anesthesiology;  Laterality: N/A;   .  Block, epidural steroid / lumb-sac 01/01/2013     Procedure: BLOCK, EPIDURAL STEROID / LUMB-SAC;  Surgeon: Caro Laroche, MD;  Location: [facility];  Service: Anesthesiology;  Laterality: N/A;         History/Current Status:  History/Current Status  Respiratory Status: within normal limits  Behavior/Mental Status: Alert;Able to follow directions;Cooperative  Nutrition: NPO    Subjective: Patient is agreeable to participation in the therapy session. Patient's medical condition is appropriate for Speech therapy intervention at this time.      Objective:  Observation of Patient/Vital Signs:  Patient is in bed   Completed bedside swallow evaluation and provided education regarding swallow, swallow strategies and precautions.    Oral Motor Skills:  Oral Motor Skills: exceptions to [facility]  Oral Motor Impairments: edentulous (1 set of dentures not fitting well, cannot locate other set)    Deglutition Skills:  Position: upright 90 degrees  Food(s) Tested: thin liquid;puree  Oral Stage: adequate  Pharyngeal Stage: adequate  Esophageal Stage: appears within functional limits    Assessment:   Pt is awake and alert. Appropriate and follows simple directions. Swallow is [facility] for thin liquids and puree consistencies. Adequate timing and laryngeal elevation noted. No cough, change in vocal quality or other s/s of  aspiration observed. Min-mod cues to use double swallows and liquid assists to increase swallow safety. Pt attempted to put in upper dentures, however, not fitting properly and she could not locate the other set. Discussed with RN and Dr. Lorel Monaco.    Plan/Recommendations:    begin/continue oral diet;no SLP f/u necessary        Diet Solids Recommendation: dysphagia pureed (thin liquids)     Precautions/Compensations: Awake/alert;Upright 90 degrees for all oral intake;45 degrees upright after meals;Full supervision with meals;Swallow 2 times per bite/sip;Small bites/sips;Eat/feed slowly  Recommended Form of Meds: Whole;With liquid (as  tolerated)  Recommendation Discussed With: : Patient;Physician;Nurse;Posted bedside    Aspiration Precautions posted at bedside.    Signature: Stefan Church, M.S. CCC-SLP

## 2013-01-09 NOTE — Plan of Care (Signed)
Problem: Occupational Therapy  Goal: Self-care - activities of daily living  OT Plan  Treatment Interventions: ADL retraining;Functional transfer training;UE strengthening/ROM;Endurance training;Cognitive reorientation;Patient/Family training;Equipment eval/education;Compensatory technique education;Fine motor coordination activities;Continued evaluation  Discharge Recommendation: SNF  DME Recommended for Discharge: (TBA)  OT Frequency Recommended: 2-3x/wk    Patient is agreeable to the following Goals:   Goal Formulation: Patient  Time For Goal Achievement: 5 visits  ADL Goals  Patient will feed self: independently;Goal met  Patient will groom self: with minimal assist  Patient will dress upper body: with minimal assist  Pt will complete bathing: With minimal assist (UE bathing)  Mobility and Transfer Goals  Pt will transfer bed to [facility]: with moderate assist    Musculoskeletal Goals  Pt will perform Home Exercise Program: independently;to increase engagement in ADLs  Outcome: Progressing  Pt is progressing towards meeting OT goals; pt able to complete UE ADLs seated in chair today with increased independence. Significant improvement noted with cognition. Pt's ability to complete ADLs and functional transfers continues to be impaired due to the following deficits:  activity tolerance, strength, decreased AROM to LUE/GMC deficits to LUE, FMC deficits to BUE's, as well as balance. Pt would continue to benefit from OT to address these deficits and increase independence with ADLs and functional transfers.

## 2013-01-09 NOTE — OT Progress Note (Signed)
[facility]  [address]  [place] [address]  [phone]    Occupational Therapy Treatment    Patient: [name]    MRN#: [id number]  Unit: 3B ONCOLOGY GENERAL MEDICINE         Bed: V564/P329-51    Medical Diagnosis: Hyponatremia [276.1]  General weakness [780.79]  General weakness    Time of treatment:    Time Calculation  OT Received On: 01/09/13  Start Time: 1030  Stop Time: 1115  Time Calculation (min): 45 min    Treatment #  OT Visit Number: 1/5    Precautions and Contraindications:  Fall   Contact isolation     Subjective: "My arthritis is painful today. I'm so weak."   Patient's medical condition is appropriate for Occupational Therapy  intervention at this time.  Patient is agreeable to participation in the therapy session. Nursing clears patient for therapy.  Pain: Pt reported LUE pain (elbow) at rest and increased with activity (did not assign number)     Objective:  Patient is seated in a bedside chair with Telemetry and Intravenous (IV)  in place.     Cognition:  Noted significant improvement from previous session. No difficulty following commands or answering questions today.     Self Care:  Feeding: independent to feed post set-up.   Grooming: Mod A; seated in chair  Bathing: UE- Max A. LE-dependent.  UB Dressing: dependent  LB Dressing: NT  Toileting: NT    Functional Mobility:  NT today secondary to fatigue from working with PT and pt had just transferred to chair.    B UE Exercises: (AROM to RUE. AAROM/PROM to LUE. Seated in chair with cueing):  Shoulder Flexion: 2 X 10  Shoulder Abduction:  2 X 10  Elbow Flexion:  2 X 10  Elbow Extenison:  2 X 10  Pronation/Supination:  2 X 10   Internal Rotation/External Rotation:  2 X 10  Wrist Flexion/Extension:  2 X 10  Hand Squeezes:  2 X 10    Treatment Activities: ADLs, functional mobility, BUE exercises    Observation of Patient:  Vitals:   BP seated in chair upon arrival: 210/83     Filed Vitals:    01/08/13 1505 01/08/13 2030  01/09/13 0545 01/09/13 0740   BP: 141/57 140/64 210/83 185/80   Pulse: 76 79 68    Temp: 101.2 F (38.4 C) 98.7 F (37.1 C) 97.3 F (36.3 C)    TempSrc:       Resp: 22 18 20 20    Height:       Weight:       SpO2: 100% 97% 99%        Education:   Educated the patient to role of occupational therapy, plan of care,  goals of therapy and HEP, safety with mobility and ADLs, energy conservation techniques, pursed lip breathing.    Patient left in bedside chair with all needs met, equipment intact and call bell within reach. RN notified of session outcome.  Updated status posted at bedside.    Assessment:  Pt is progressing towards meeting OT goals; pt able to complete UE ADLs seated in chair today with increased independence. Significant improvement noted with cognition. Pt's ability to complete ADLs and functional transfers continues to be impaired due to the following deficits:  activity tolerance, strength, decreased AROM to LUE/GMC deficits to LUE, FMC deficits to BUE's, as well as balance. Pt would continue to benefit from OT to address  these deficits and increase independence with ADLs and functional transfers.     Goals:  Time For Goal Achievement: 5 visits  ADL Goals  Patient will feed self: independently;Goal met  Patient will groom self: with minimal assist  Patient will dress upper body: with minimal assist  Pt will complete bathing: With minimal assist (UE bathing)  Mobility and Transfer Goals  Pt will transfer bed to [facility]: with moderate assist     Musculoskeletal Goals  Pt will perform Home Exercise Program: independently;to increase engagement in ADLs    OT Plan  Treatment Interventions: ADL retraining;Functional transfer training;UE strengthening/ROM;Endurance training;Cognitive reorientation;Patient/Family training;Equipment eval/education;Compensatory technique education;Fine motor coordination activities;Continued evaluation  Discharge Recommendation: SNF  DME Recommended for Discharge:  (TBA)  OT  Frequency Recommended: 2-3x/wk    Continue plan of care.    Signature:   Louie Boston, OT  01/09/2013 12:35 PM   Phone: [phone]

## 2013-01-10 ENCOUNTER — Inpatient Hospital Stay: Payer: Medicare Other

## 2013-01-10 LAB — CBC
Hematocrit: 33 % — ABNORMAL LOW (ref 37.0–47.0)
Hgb: 10.8 g/dL — ABNORMAL LOW (ref 12.0–16.0)
MCH: 29.8 pg (ref 28.0–32.0)
MCHC: 32.7 g/dL (ref 32.0–36.0)
MCV: 91.2 fL (ref 80.0–100.0)
MPV: 11 fL (ref 9.4–12.3)
Nucleated RBC: 0 (ref 0–1)
Platelets: 191 (ref 140–400)
RBC: 3.62 — ABNORMAL LOW (ref 4.20–5.40)
WBC: 9.28 (ref 3.50–10.80)

## 2013-01-10 LAB — POCT GLUCOSE
Whole Blood Glucose POCT: 188 mg/dL — AB (ref 70–100)
Whole Blood Glucose POCT: 232 mg/dL — AB (ref 70–100)
Whole Blood Glucose POCT: 294 mg/dL — AB (ref 70–100)
Whole Blood Glucose POCT: 193 mg/dL — AB (ref 70–100)

## 2013-01-10 MED ORDER — GADOBUTROL 1 MMOL/ML IV SOLN
INTRAVENOUS | Status: DC
Start: 2013-01-10 — End: 2013-01-10
  Filled 2013-01-10: qty 10

## 2013-01-10 NOTE — Progress Notes (Signed)
Pt alert but confused, denies pain, no respiratory distress noted, iv fluid infusing, incontinent care provided, TARP Q2H, will continue to monitor pt

## 2013-01-10 NOTE — Progress Notes (Signed)
Patient alert and oriented except for time, denies for pain, no signs of acute resp distress, continues with IVF for 1 litre,on tele monitor as ordered, aspiration precaution maintained, on contact precautions, incontinence care provided, TARP in progress, IV site changed, MRI questionnaire completed by her Nephew, faxed to MRI, insulin given as per sliding scale, no signs of hypo/hyperglycemia, on IV antibiotics, no signs of adverse reactions, call light within reach, will continue to monitor.

## 2013-01-10 NOTE — Progress Notes (Addendum)
Palliative Care Progress Note - [facility]    Date Time: 01/10/2013 11:13 AM  Patient Name: [name]  Attending Physician: Dorathy Daft, MD    Assessment/Plan:   Pain - patient denies any at this time.  She does have a history of arthritis and has both percocet and ultram/acetaminophen available for pain at this time.    Confusion - patient is alert, oriented to person, place and time with the exception of day (she knows month and year).  No agitation noted.  Consider secondary to infection as CT of her head was negative for CVA.      Advance Care Plan - based on my discussion with the patient today, she is clear that she does not want to be maintained on life support or resuscitated from a cardiac standpoint.  Code status is DNAR with support.  She states that her niece has a copy of her advanced directive and her niece will be coming to see her tomorrow.  She has no other questions about her care and wants to feel better.    Disposition - per primary team.  Will continue to follow.    Chief Complaint:   Altered mental status    Interval History:   [name] is a 79 y.o. female with a history of hypertension, arthritis, diabetes admitted to the hospital with confusion and weakness on 01/07/2013.  She states that she does not have any pain at this time.  She does feel weak.  She shares that her goal is to feel better.  She will be turning 89 on the 18th of this month.  She is oriented to person, place, knows that it is Saturday, the month is September, she thinks the day is the 15th.  She knows that it is 2014.  She shares that she has done an advanced directive which her niece has a copy of.  She shares that she would not want any resuscitative efforts if God were to call her home.  She states that she has had a long life and she has enjoyed her time.  She would not want to be brought back if her time were to come as God is the one who decides when our lives end.  She knows that the doctors are doing  everything to make sure that she feels better and she appreciates this.    Past Medical History:     Past Medical History   Diagnosis Date   . Diabetes mellitus without complication    . Hypertensive disorder    . Arthritis      rheumatoid       Social History:   Code status: AND/DNAR no Support     Allergies:     Allergies   Allergen Reactions   . Azithromycin    . Dilaudid (Hydromorphone Hcl)    . Erythromycin    . Iodine      Iv contrast       Medications:     Current Facility-Administered Medications   Medication Dose Route Frequency Last Rate Last Dose   . 0.9%  NaCl infusion   Intravenous Continuous 50 mL/hr at 01/09/13 1848     . acetaminophen (TYLENOL) suppository 650 mg  650 mg Rectal Q6H PRN   650 mg at 01/10/13 0619   . amLODIPine (NORVASC) tablet 5 mg  5 mg Oral Daily   5 mg at 01/10/13 0936   . aspirin chewable tablet 81 mg  81 mg Oral Daily  81 mg at 01/10/13 0818   . dextrose (GLUCOSE) 40 % oral gel 15 g  15 g Oral PRN       . dextrose 50 % bolus 25 mL  25 mL Intravenous PRN       . gabapentin (NEURONTIN) capsule 400 mg  400 mg Oral BID   400 mg at 01/10/13 0936   . glucagon (rDNA) (GLUCAGEN) injection 1 mg  1 mg Intramuscular PRN       . hydrALAZINE (APRESOLINE) tablet 25 mg  25 mg Oral Q4H PRN   25 mg at 01/09/13 0615   . insulin aspart (NovoLOG) injection 1-3 Units  1-3 Units Subcutaneous QHS and 0300 PRN   2 Units at 01/08/13 2233   . insulin aspart (NovoLOG) injection 1-5 Units  1-5 Units Subcutaneous TID AC PRN   1 Units at 01/10/13 0818   . insulin glargine (LANTUS) injection 10 Units  10 Units Subcutaneous QHS   10 Units at 01/09/13 2238   . lisinopril (PRINIVIL,ZESTRIL) tablet 2.5 mg  2.5 mg Oral Daily   2.5 mg at 01/10/13 0936   . naproxen (NAPROSYN) tablet 500 mg  500 mg Oral BID Meals   500 mg at 01/10/13 0818   . oxyCODONE-acetaminophen (PERCOCET) 5-325 MG per tablet 1 tablet  1 tablet Oral Q8H PRN       . pantoprazole (PROTONIX) EC tablet 40 mg  40 mg Oral QAM AC   40 mg at 01/10/13  0817   . piperacillin-tazobactam (ZOSYN) 4.5 g in sodium chloride 0.9 % 100 mL IVPB mini-bag plus  4.5 g Intravenous Q8H SCH 200 mL/hr at 01/10/13 0534 4.5 g at 01/10/13 0534   . predniSONE (DELTASONE) tablet 5 mg  5 mg Oral QAM W/BREAKFAST   5 mg at 01/10/13 0818   . sucralfate (CARAFATE) tablet 1 g  1 g Oral Daily   1 g at 01/10/13 0936   . Ultram + Acetaminophen 50-325 combo dose   Oral Q6H PRN       . vitamin B-12 (CYANOCOBALAMIN) tablet 500 mcg  500 mcg Oral Daily   500 mcg at 01/10/13 0818     Review of Systems:   Negative other than described above.    Physical Exam:   VS: BP 156/67  Pulse 62  Temp 100.5 F (38.1 C) (Oral)  Resp 20  Ht 1.753 m (5' 9.02")  Wt 54.432 kg (120 lb)  BMI 17.71 kg/m2  SpO2 100%  GENERAL: Well developed elderly female sitting up in bed.  Awake, interactive.  Appears in no obvious distress.  EYES: PERRLA. Sclera anicteric. Conjunctivae pink.  ENT: Oral mucosa moist.  NECK: Trachea midline. Neck veins flat. No adenopathy.  HEART: RRR. Normal S1, S2.   CHEST: Breath sounds are clear anteriorly and laterally.  ABDOMEN: Soft. Non-tender. Non-distended. BS+.  GU: Deferred.  RECTAL: Deferred.  EXTREMITIES: No edema. No clubbing. No cyanosis.  PSYCH: Alert. Oriented x 3. Answers all questions appropriately.  NEURO: Alert and oriented x 3      Labs/Diagnostics:   Reviewed.  Wt Readings from Last 5 Encounters:   01/07/13 54.432 kg (120 lb)   12/18/12 58.06 kg (128 lb)   12/18/12 58.06 kg (128 lb)   07/14/12 58.469 kg (128 lb 14.4 oz)   05/31/12 62.869 kg (138 lb 9.6 oz)     Signed by: Adelene Amas, MD  [facility]  [phone]

## 2013-01-10 NOTE — Progress Notes (Signed)
[facility] INTERNAL MEDICINE PROGRESS NOTE    Date Time: 01/10/2013 10:51 AM  Patient Name: [name]        Subjective:   Patient alert, oriented to person and place, states she feels weak    Medications:   Scheduled Meds:  Current Facility-Administered Medications   Medication Dose Route Frequency   . amLODIPine  5 mg Oral Daily   . aspirin  81 mg Oral Daily   . gabapentin  400 mg Oral BID   . insulin glargine  10 Units Subcutaneous QHS   . lisinopril  2.5 mg Oral Daily   . naproxen  500 mg Oral BID Meals   . pantoprazole  40 mg Oral QAM AC   . piperacillin-tazobactam  4.5 g Intravenous Q8H SCH   . predniSONE  5 mg Oral QAM W/BREAKFAST   . sucralfate  1 g Oral Daily   . vitamin B-12  500 mcg Oral Daily     Continuous Infusions:     . sodium chloride 50 mL/hr at 01/09/13 1848     PRN Meds:.acetaminophen, dextrose, dextrose, glucagon (rDNA), hydrALAZINE, insulin aspart, insulin aspart, oxyCODONE-acetaminophen, Ultram + Acetaminophen 50-325 combo dose      Review of Systems:   A comprehensive review of systems was: History obtained from chart review and the patient  General ROS: weak  Respiratory ROS: no cough, shortness of breath, or wheezing  Cardiovascular ROS: no chest pain   Gastrointestinal ROS: no abdominal pain,     Physical Exam:     Filed Vitals:    01/09/13 1300 01/09/13 2110 01/10/13 0539 01/10/13 0739   BP: 154/63 188/71 189/84 156/67   Pulse: 83 74  62   Temp: 99.7 F (37.6 C) 100.6 F (38.1 C) 100.5 F (38.1 C)    TempSrc: Oral Oral     Resp: 22 20 17 20    Height:       Weight:       SpO2: 99% 100% 100%        General appearance - elderly, frail alert, and in no distress  Mental status - alert, oriented to person, place  Chest - clear to auscultation, no wheezes, rales or rhonchi, symmetric air entry  Heart - normal rate, regular rhythm, normal S1, S2, no murmurs, rubs, clicks or gallops  Abdomen - bowel sounds normal, Abdomen is soft, nontender, nondistended  Neurological - alert,, normal  speech      Labs:     Results     Procedure Component Value Units Date/Time    POCT glucose (AC and HS) [[id number]]  (Abnormal) Collected:01/10/13 0741     POCT Glucose WB 188 (A) mg/dL [id number]:14/78/29 5621    CBC without differential [[id number]]  (Abnormal) Collected:01/10/13 0648    Specimen Information:Blood / Blood Updated:01/10/13 0742     WBC 9.28      RBC 3.62 (L)      Hgb 10.8 (L) g/dL      Hematocrit 96.2 (L) %      MCV 91.2 fL      MCH 29.8 pg      MCHC 32.7 g/dL      RDW Unmeasured %      Platelets 191      MPV 11.0 fL      Nucleated RBC 0     Vitamin B12 [[id number]]  (Abnormal) Collected:01/09/13 1544    Specimen Information:Blood Updated:01/09/13 1930     Vitamin B-12 >2000 (H) pg/mL     Basic Metabolic  Panel [[id number]]  (Abnormal) Collected:01/09/13 1829    Specimen Information:Blood Updated:01/09/13 1908     Glucose 350 (H) mg/dL      BUN 11 mg/dL      Creatinine 0.8 mg/dL      CALCIUM 7.9 mg/dL      Sodium 638 (L)      Potassium 3.7      Chloride 95 (L)      CO2 22     GFR [[id number]] Collected:01/09/13 1829     EGFR >60.0 Updated:01/09/13 1908    Hemolysis index [[id number]]  (Abnormal) Collected:01/09/13 1544     Hemolysis Index 148 (H) Updated:01/09/13 1854    POCT glucose (AC and HS) [[id number]]  (Abnormal) Collected:01/09/13 1705     POCT Glucose WB 377 (A) mg/dL TKZSWFU:93/23/55 7322    CULTURE BLOOD AEROBIC AND ANAEROBIC [[id number]] Collected:01/08/13 1154    Specimen Information:Blood, Venipuncture Updated:01/09/13 1615    Narrative:    ORDER#: [id number]                                    ORDERED BY: Claudette Head  SOURCE: Blood, Venipuncture peripheral               COLLECTED:  01/08/13 11:54  ANTIBIOTICS AT COLL.:                                RECEIVED :  01/08/13 15:29  Culture Blood Aerobic and Anaerobic        PRELIM      01/09/13 16:15  01/09/13   No Growth after 1 day/s of incubation.      POCT glucose (AC and HS) [[id number]]  (Abnormal) Collected:01/09/13 1153    Specimen  Information:Blood Updated:01/09/13 1154     POCT Glucose WB 240 (A) mg/dL     POCT glucose (AC and HS) [[id number]]  (Abnormal) Collected:01/09/13 1150    Specimen Information:Blood Updated:01/09/13 1151     POCT Glucose WB 153 (A) mg/dL             Lab 62/70/35 0741 01/09/13 1705 01/09/13 1153 01/09/13 1150 01/09/13 0707 01/08/13 2142 01/08/13 2112 01/08/13 1641 01/08/13 1231 01/07/13 1815   GLUCOSEWHOLE 188* 377* 240* 153* 240* 255* 256* 309* 197* 187*       Assessment and Plan:     Weakness and mental status change improving with hydration and antibiotics  Hyponatremia partially due to hyperglycemia  RA  DM uncontrolled just started on lantus    PLAN:    Continue antibiotics for now, recheck cxray, monitor glucose, PT, OT  Spoke with patient regarding code status, she would want treatments to keep her going for short time but would not want to be maintained on machines. If her heart stopped or she stopped breathing she would not want to be brought back. Will order limited DNR.        Signed by: Ria Clock, MD

## 2013-01-11 LAB — POCT GLUCOSE
Whole Blood Glucose POCT: 127 mg/dL — AB (ref 70–100)
Whole Blood Glucose POCT: 180 mg/dL — AB (ref 70–100)
Whole Blood Glucose POCT: 283 mg/dL — AB (ref 70–100)
Whole Blood Glucose POCT: 160 mg/dL — AB (ref 70–100)

## 2013-01-11 LAB — COMPREHENSIVE METABOLIC PANEL
ALT: 28 U/L (ref 0–55)
AST (SGOT): 23 U/L (ref 5–34)
Albumin/Globulin Ratio: 0.6 — ABNORMAL LOW (ref 0.9–2.2)
Albumin: 2.2 g/dL — ABNORMAL LOW (ref 3.5–5.0)
Alkaline Phosphatase: 209 U/L — ABNORMAL HIGH (ref 40–150)
BUN: 12 mg/dL (ref 7–21)
Bilirubin, Total: 0.3 mg/dL (ref 0.2–1.2)
CO2: 26 (ref 22–29)
Calcium: 7.8 mg/dL — ABNORMAL LOW (ref 7.9–10.6)
Chloride: 99 (ref 98–107)
Creatinine: 0.7 mg/dL (ref 0.6–1.0)
Globulin: 3.8 g/dL — ABNORMAL HIGH (ref 2.0–3.6)
Glucose: 132 mg/dL — ABNORMAL HIGH (ref 70–100)
Potassium: 3.4 — ABNORMAL LOW (ref 3.5–5.1)
Protein, Total: 6 g/dL (ref 6.0–8.3)
Sodium: 135 — ABNORMAL LOW (ref 136–145)

## 2013-01-11 LAB — CBC
Hematocrit: 31.1 % — ABNORMAL LOW (ref 37.0–47.0)
Hgb: 10.2 g/dL — ABNORMAL LOW (ref 12.0–16.0)
MCH: 29.7 pg (ref 28.0–32.0)
MCHC: 32.8 g/dL (ref 32.0–36.0)
MCV: 90.7 fL (ref 80.0–100.0)
MPV: 11.2 fL (ref 9.4–12.3)
Nucleated RBC: 0 (ref 0–1)
Platelets: 213 (ref 140–400)
RBC: 3.43 — ABNORMAL LOW (ref 4.20–5.40)
RDW: 14 % (ref 12–15)
WBC: 7.24 (ref 3.50–10.80)

## 2013-01-11 LAB — GFR: EGFR: >60

## 2013-01-11 NOTE — Progress Notes (Signed)
Patient alert and confused, able to verbalize needs, maintained on contact precautions, no signs of acute resp distress, vitals stable, turning and repositioning done, coverage given as per sliding scale, bed alarm on for safety measures, sinus brady on tele monitor, call light within reach, will continue to monitor.

## 2013-01-11 NOTE — Progress Notes (Signed)
[facility] INTERNAL MEDICINE PROGRESS NOTE    Date Time: 01/11/2013 10:17 AM  Patient Name: [name],[name]        Subjective:   Feeling weak though better    Medications:   Scheduled Meds:  Current Facility-Administered Medications   Medication Dose Route Frequency   . amLODIPine  5 mg Oral Daily   . aspirin  81 mg Oral Daily   . gabapentin  400 mg Oral BID   . insulin glargine  10 Units Subcutaneous QHS   . lisinopril  2.5 mg Oral Daily   . naproxen  500 mg Oral BID Meals   . pantoprazole  40 mg Oral QAM AC   . piperacillin-tazobactam  4.5 g Intravenous Q8H SCH   . predniSONE  5 mg Oral QAM W/BREAKFAST   . sucralfate  1 g Oral Daily   . vitamin B-12  500 mcg Oral Daily     Continuous Infusions:     . sodium chloride 50 mL/hr at 01/09/13 1848     PRN Meds:.acetaminophen, dextrose, dextrose, glucagon (rDNA), hydrALAZINE, insulin aspart, insulin aspart, oxyCODONE-acetaminophen, Ultram + Acetaminophen 50-325 combo dose      Review of Systems:   A comprehensive review of systems was: History obtained from chart review and the patient  General ROS: weak, no fever, no chills  Respiratory ROS: no cough, shortness of breath, or wheezing  Cardiovascular ROS: no chest pain   Gastrointestinal ROS: no abdominal pain,     Physical Exam:     Filed Vitals:    01/10/13 0739 01/10/13 1425 01/11/13 0559 01/11/13 0950   BP: 156/67 144/61 166/62 133/60   Pulse: 62 72 65 69   Temp:  96.9 F (36.1 C) 97.5 F (36.4 C)    TempSrc:       Resp: 20 20 18 20    Height:       Weight:       SpO2:  100% 100%        General appearance - alert, well appearing, and in no distress, frail elderly female  Mental status - alert, oriented to person, place, and time  Chest - clear to auscultation, no wheezes, rales or rhonchi, symmetric air entry  Heart - normal rate, regular rhythm, normal S1, S2, no murmurs, rubs, clicks or gallops  Abdomen - bowel sounds normal, Abdomen is soft, nontender, nondistended    Labs:     Results     Procedure Component  Value Units Date/Time    POCT glucose (AC and HS) [[id number]]  (Abnormal) Collected:01/11/13 0810     POCT Glucose WB 127 (A) mg/dL FAOZHYQ:65/78/46 9629    Comprehensive metabolic panel [[id number]]  (Abnormal) Collected:01/11/13 0554    Specimen Information:Blood Updated:01/11/13 0712     Glucose 132 (H) mg/dL      BUN 12 mg/dL      Creatinine 0.7 mg/dL      Sodium 010 (L)      Potassium 3.4 (L)      Chloride 99      CO2 26      CALCIUM 7.8 (L) mg/dL      Protein, Total 6.0 g/dL      Albumin 2.2 (L) g/dL      AST (SGOT) 23 U/L      ALT 28 U/L      Alkaline Phosphatase 209 (H) U/L      Bilirubin, Total 0.3 mg/dL      Globulin 3.8 (H) g/dL  Albumin/Globulin Ratio 0.6 (L)     GFR [[id number]] Collected:01/11/13 0554     EGFR >60.0 Updated:01/11/13 0712    CBC without differential [[id number]]  (Abnormal) Collected:01/11/13 0554    Specimen Information:Blood / Blood Updated:01/11/13 0640     WBC 7.24      RBC 3.43 (L)      Hgb 10.2 (L) g/dL      Hematocrit 62.9 (L) %      MCV 90.7 fL      MCH 29.7 pg      MCHC 32.8 g/dL      RDW 14 %      Platelets 213      MPV 11.2 fL      Nucleated RBC 0     POCT glucose (AC and HS) [[id number]]  (Abnormal) Collected:01/10/13 2131     POCT Glucose WB 193 (A) mg/dL WNUUVOZ:36/64/40 3474    POCT glucose (AC and HS) [[id number]]  (Abnormal) Collected:01/10/13 1644     POCT Glucose WB 294 (A) mg/dL IEPPIRJ:18/84/16 6063    CULTURE BLOOD AEROBIC AND ANAEROBIC [[id number]] Collected:01/08/13 1154    Specimen Information:Blood, Venipuncture Updated:01/10/13 1615    Narrative:    ORDER#: [id number]                                    ORDERED BY: Claudette Head  SOURCE: Blood, Venipuncture peripheral               COLLECTED:  01/08/13 11:54  ANTIBIOTICS AT COLL.:                                RECEIVED :  01/08/13 15:29  Culture Blood Aerobic and Anaerobic        PRELIM      01/10/13 16:15  01/09/13   No Growth after 1 day/s of incubation.  01/10/13   No Growth after 2 day/s of incubation.       POCT glucose (AC and HS) [[id number]]  (Abnormal) Collected:01/10/13 1213     POCT Glucose WB 232 (A) mg/dL SEGBTDV:76/16/07 3710            Lab 01/11/13 0810 01/10/13 2131 01/10/13 1644 01/10/13 1213 01/10/13 0741 01/09/13 1705 01/09/13 1153 01/09/13 1150 01/09/13 0707 01/08/13 2142   GLUCOSEWHOLE 127* 193* 294* 232* 188* 377* 240* 153* 240* 255*           Rads:     Radiology Results (24 Hour)     Procedure Component Value Units Date/Time    XR Chest 2 Views [[id number]] Collected:01/10/13 1201    Order Status:Completed  Updated:01/10/13 1206    Narrative:    History: aspiration pna ,    Technique: PA and Lateral    Comparison: 01/07/2013    Findings:  There is a focal opacity at the left lung base, characteristic of a  combination of a small left pleural effusion and associated atelectasis.  There also appears to be a trace right pleural effusion There is no  pneumothorax.  There is mild cardiomegaly.     Atherosclerotic calcifications are seen  in the aorta.     There are degenerative changes in the spine and shoulders.      Impression:     Enlarging small left effusion.     Laurena Slimmer, MD   01/10/2013 12:02 PM  Assessment and Plan:     Generalized weakness likely due to acute febrile illness ? Viral vs bacterial and has improved with IVF and antibiotics  DM fair  Hyponatremia resolving  Rheumatoid Arthritis  cxray increasing small effusion ? Etiology    PLAN:    Change IV antibiotics to po, [place] IVF and see if patient can maintain hydration, continue therapy, discharge planning to SNF likely, check BNP with labs in am     Signed by: Ria Clock, MD

## 2013-01-11 NOTE — Progress Notes (Signed)
Pt alert but confused, denies pain, no respiratory distress noted, incontinent care provided, TARP Q2H, will continue to monitor pt

## 2013-01-12 LAB — CBC
Hematocrit: 31.7 % — ABNORMAL LOW (ref 37.0–47.0)
Hgb: 10.2 g/dL — ABNORMAL LOW (ref 12.0–16.0)
MCH: 29.8 pg (ref 28.0–32.0)
MCHC: 32.2 g/dL (ref 32.0–36.0)
MCV: 92.7 fL (ref 80.0–100.0)
MPV: 11.2 fL (ref 9.4–12.3)
Platelets: 235 (ref 140–400)
RBC: 3.42 — ABNORMAL LOW (ref 4.20–5.40)
RDW: 15 % (ref 12–15)
WBC: 8.07 (ref 3.50–10.80)

## 2013-01-12 LAB — COMPREHENSIVE METABOLIC PANEL
ALT: 21 U/L (ref 0–55)
AST (SGOT): 17 U/L (ref 5–34)
Albumin/Globulin Ratio: 0.6 — ABNORMAL LOW (ref 0.9–2.2)
Albumin: 2.2 g/dL — ABNORMAL LOW (ref 3.5–5.0)
Alkaline Phosphatase: 180 U/L — ABNORMAL HIGH (ref 40–150)
BUN: 15 mg/dL (ref 7–21)
Bilirubin, Total: 0.3 mg/dL (ref 0.2–1.2)
CO2: 29 (ref 22–29)
Calcium: 7.9 mg/dL (ref 7.9–10.6)
Chloride: 100 (ref 98–107)
Creatinine: 0.7 mg/dL (ref 0.6–1.0)
Globulin: 3.8 g/dL — ABNORMAL HIGH (ref 2.0–3.6)
Glucose: 99 mg/dL (ref 70–100)
Potassium: 3.7 (ref 3.5–5.1)
Protein, Total: 6 g/dL (ref 6.0–8.3)
Sodium: 137 (ref 136–145)

## 2013-01-12 LAB — POCT GLUCOSE
Whole Blood Glucose POCT: 94 mg/dL (ref 70–100)
Whole Blood Glucose POCT: 122 mg/dL — AB (ref 70–100)
Whole Blood Glucose POCT: 123 mg/dL — AB (ref 70–100)
Whole Blood Glucose POCT: 136 mg/dL — AB (ref 70–100)

## 2013-01-12 LAB — B-TYPE NATRIURETIC PEPTIDE: B-Natriuretic Peptide: 90 pg/mL (ref 0–100)

## 2013-01-12 LAB — GFR: EGFR: >60

## 2013-01-12 MED ORDER — SENNOSIDES-DOCUSATE SODIUM 8.6-50 MG PO TABS
1.0000 | ORAL_TABLET | Freq: Two times a day (BID) | ORAL | Status: DC
Start: 2013-01-12 — End: 2013-01-16
  Administered 2013-01-12 – 2013-01-16 (×7): 1 via ORAL
  Filled 2013-01-12 (×9): qty 1

## 2013-01-12 MED ORDER — ONDANSETRON HCL 4 MG/2ML IJ SOLN
4.0000 mg | Freq: Four times a day (QID) | INTRAMUSCULAR | Status: DC | PRN
Start: 2013-01-12 — End: 2013-01-16
  Administered 2013-01-12 – 2013-01-13 (×2): 4 mg via INTRAVENOUS
  Filled 2013-01-12 (×2): qty 2

## 2013-01-12 MED ORDER — AMOXICILLIN-POT CLAVULANATE 875-125 MG PO TABS
1.0000 | ORAL_TABLET | Freq: Two times a day (BID) | ORAL | Status: DC
Start: 2013-01-12 — End: 2013-01-13
  Administered 2013-01-12 – 2013-01-13 (×2): 1 via ORAL
  Filled 2013-01-12 (×2): qty 1

## 2013-01-12 NOTE — Plan of Care (Signed)
Patient has been sleeping most of the evening.  Does wake up easily to voice.  Appears to be alert and oriented to person and place.  SCDs on ble -- legs checked underneath. Patient administered medications mixed in applesauce.  Patient able to swallow with slightly noted difficulty.  C/o achiness to left upper extremity, which she explained is due to arthritis pain.  Administered po tramadol and tylenol.  TARP and given skin care.  Left upper extremity with limited movement and pain with movement.  Accucheck 160, administered insulin per order.

## 2013-01-12 NOTE — Progress Notes (Addendum)
[facility] INTERNAL [facility]ICINE PROGRESS NOTE    Date Time: 01/12/2013 1:45 PM  Patient Name: [name],[name]        Subjective:   Feeling very weak , also per nurse , had vomiting x 2     Medications:   Scheduled Meds:  Current Facility-Administered Medications   Medication Dose Route Frequency   . amLODIPine  5 mg Oral Daily   . aspirin  81 mg Oral Daily   . gabapentin  400 mg Oral BID   . insulin glargine  10 Units Subcutaneous QHS   . lisinopril  2.5 mg Oral Daily   . naproxen  500 mg Oral BID Meals   . pantoprazole  40 mg Oral QAM AC   . predniSONE  5 mg Oral QAM W/BREAKFAST   . senna-docusate  1 tablet Oral BID   . sucralfate  1 g Oral Daily   . vitamin B-12  500 mcg Oral Daily     Continuous Infusions:   PRN Meds:.acetaminophen, dextrose, dextrose, glucagon (rDNA), hydrALAZINE, insulin aspart, insulin aspart, ondansetron, oxyCODONE-acetaminophen, Ultram + Acetaminophen 50-325 combo dose      Review of Systems:   A comprehensive review of systems was: History obtained from chart review and the patient  General ROS: weak, no fever, no chills  Respiratory ROS: no cough, shortness of breath, or wheezing  Cardiovascular ROS: no chest pain   Gastrointestinal ROS: no abdominal pain,     Physical Exam:     Filed Vitals:    01/11/13 0950 01/11/13 1430 01/11/13 2045 01/12/13 0617   BP: 133/60 127/59 159/78 135/62   Pulse: 69 83 74 62   Temp:  98 F (36.7 C) 99.2 F (37.3 C) 96.9 F (36.1 C)   TempSrc:       Resp: 20 16 18 18    Height:       Weight:       SpO2:  99% 96% 100%       General appearance - alert, well appearing, and in no distress, frail elderly female  Mental status - alert, oriented to person, place, and time  Chest - clear to auscultation, no wheezes, rales or rhonchi, symmetric air entry  Heart - normal rate, regular rhythm, normal S1, S2, no murmurs, rubs, clicks or gallops  Abdomen - bowel sounds normal, Abdomen is soft, nontender, nondistended    Labs:     Results     Procedure Component Value Units  Date/Time    POCT glucose (AC and HS) [[id number]]  (Abnormal) Collected:01/12/13 1221     POCT Glucose WB 122 (A) mg/dL XBJYNWG:95/62/13 0865    POCT glucose (AC and HS) [[id number]] Collected:01/12/13 0753     POCT Glucose WB 94 mg/dL MWUXLKG:40/10/27 2536    Comprehensive metabolic panel [[id number]]  (Abnormal) Collected:01/12/13 0504    Specimen Information:Blood Updated:01/12/13 0558     Glucose 99 mg/dL      BUN 15 mg/dL      Creatinine 0.7 mg/dL      Sodium 595      Potassium 3.7      Chloride 100      CO2 29      CALCIUM 7.9 mg/dL      Protein, Total 6.0 g/dL      Albumin 2.2 (L) g/dL      AST (SGOT) 17 U/L      ALT 21 U/L      Alkaline Phosphatase 180 (H) U/L      Bilirubin, Total  0.3 mg/dL      Globulin 3.8 (H) g/dL      Albumin/Globulin Ratio 0.6 (L)     GFR [[id number]] Collected:01/12/13 0504     EGFR >60.0 Updated:01/12/13 0558    B-type Natriuretic Peptide [[id number]] Collected:01/12/13 0504    Specimen Information:Blood Updated:01/12/13 0554     B-Natriuretic Peptide 90 pg/mL     CBC without differential [[id number]]  (Abnormal) Collected:01/12/13 0504    Specimen Information:Blood / Blood Updated:01/12/13 0532     WBC 8.07      RBC 3.42 (L)      Hgb 10.2 (L) g/dL      Hematocrit 08.6 (L) %      MCV 92.7 fL      MCH 29.8 pg      MCHC 32.2 g/dL      RDW 15 %      Platelets 235      MPV 11.2 fL     POCT glucose (AC and HS) [[id number]]  (Abnormal) Collected:01/11/13 2142     POCT Glucose WB 160 (A) mg/dL BMWUXLK:44/01/02 7253    POCT glucose (AC and HS) [[id number]]  (Abnormal) Collected:01/11/13 1630     POCT Glucose WB 283 (A) mg/dL QVZDGLO:75/64/33 2951    CULTURE BLOOD AEROBIC AND ANAEROBIC [[id number]] Collected:01/08/13 1154    Specimen Information:Blood, Venipuncture Updated:01/11/13 1615    Narrative:    ORDER#: [id number]                                    ORDERED BY: Claudette Head  SOURCE: Blood, Venipuncture peripheral               COLLECTED:  01/08/13 11:54  ANTIBIOTICS AT COLL.:                                 RECEIVED :  01/08/13 15:29  Culture Blood Aerobic and Anaerobic        PRELIM      01/11/13 16:15  01/09/13   No Growth after 1 day/s of incubation.  01/10/13   No Growth after 2 day/s of incubation.  01/11/13   No Growth after 3 day/s of incubation.              Lab 01/12/13 1221 01/12/13 0753 01/11/13 2142 01/11/13 1630 01/11/13 1204 01/11/13 0810 01/10/13 2131 01/10/13 1644 01/10/13 1213 01/10/13 0741   GLUCOSEWHOLE 122* 94 160* 283* 180* 127* 193* 294* 232* 188*           Rads:     Radiology Results (24 Hour)     ** No Results found for the last 24 hours. **          Assessment and Plan:     Generalized weakness likely due to acute febrile illness ? Viral vs bacterial and has improved with IVF and antibiotics, iv discontinued yesterday , will start po today , see if she tolerates po   DM fair  Hyponatremia resolved   Rheumatoid Arthritis  cxray increasing small effusion ? Etiology    PLAN:  Pt eval  Change IV antibiotics to po, [place] IVF and see if patient can maintain hydration, continue therapy, discharge planning to SNF likely,  BNP not impressive .   Pt eval   Neurology will be following today , MRI not done . D/w neuro     Signed  by: Dorathy Daft, MD

## 2013-01-12 NOTE — PT Progress Note (Signed)
Physical Therapy Cancellation Note    Patient: [name]  MWU:[id number]    Unit: U725/D664-40    Patient not seen for physical therapy secondary to pt refusal.  Pt received sleeping in bed.  Easily aroused.  Pt declined OOB and bed therex.  Will attempt tomorrow.      Prince Solian, PT  01/12/2013  3:12 PM

## 2013-01-12 NOTE — Progress Notes (Signed)
Referral initiated to skilled nursing facilities to include [facility] nursing and [facility].

## 2013-01-12 NOTE — Progress Notes (Signed)
Palliative Care Progress Note - [facility]    Date Time: 01/12/2013 11:55 AM  Patient Name: [name]  Attending Physician: Dorathy Daft, MD    Assessment/Plan:   Pain -none    Poor appetite. No dysphagia. Minimal oral intake    Constipation: senna-s. Hold for diarrhea    Confusion : she appears at baseline. Oriented and appropriate.    Advance Care Plan -limited DNR    Disposition -per primary team    Chief Complaint:   none    Interval History:   States feels well. No nausea,vomiting. No dysphagia. Poor appetite. Little oral intake. No BM since admission 4 days ago. No abdominal pain    Past Medical History:     Past Medical History   Diagnosis Date   . Diabetes mellitus without complication    . Hypertensive disorder    . Arthritis      rheumatoid       Social History:   Code status: DNAR with Support     Allergies:     Allergies   Allergen Reactions   . Azithromycin    . Dilaudid (Hydromorphone Hcl)    . Erythromycin    . Iodine      Iv contrast       Medications:   Medication list/MAR reviewed.    Review of Systems:   Negative other than described above.    Physical Exam:   VS:   Filed Vitals:    01/12/13 0617   BP: 135/62   Pulse: 62   Temp: 96.9 F (36.1 C)   Resp: 18   SpO2: 100%     GENERAL: Appears in no obvious distress.  EYES: PERRLA. Sclera anicteric. Conjunctivae pink.  ENT: Oral mucosa moist.  NECK: Trachea midline. Neck veins flat. No adenopathy.  HEART: RRR. Normal S1, S2. No murmur appreciated.  CHEST: Breath sounds are clear bilaterally.  ABDOMEN: Soft. Non-tender. Non-distended. BS+.  GU: Deferred.  RECTAL: Deferred.  EXTREMITIES: No edema. No clubbing. No cyanosis.  SKIN: No rash or lesion.  PSYCH: Alert. Oriented x 3. Appropriate.  NEURO: Moves all extremities. No focal weakness.      Labs/Diagnostics:   Reviewed.  Wt Readings from Last 5 Encounters:   01/07/13 54.432 kg (120 lb)   12/18/12 58.06 kg (128 lb)   12/18/12 58.06 kg (128 lb)   07/14/12 58.469 kg (128 lb 14.4 oz)   05/31/12  62.869 kg (138 lb 9.6 oz)       Time in: 1130am/Time out: 12noon. More than 50% of this visit was spent in face to face consultation and discussion of diagnosis, prognosis, and treatment options.  Signed by: Walker Shadow, MD  [facility]  [phone]

## 2013-01-12 NOTE — Progress Notes (Signed)
CONSULTATION    Date Time: 01/12/2013 2:20 PM  Patient Name: [name], [name]  Requesting Physician: Dorathy Daft, MD      Reason for Consultation:   Generalized weakness    Assessment and plan   1) Acute confusional state and generalized weakness:   MRI was deferred due to patient intolerance.  Repeat Ct head showed no changes.  She is till feeling weak with no new focal deficit. Most likely secondary to her current infection.   No acute neurological intervention at this time.      Past Medical History:     Past Medical History   Diagnosis Date   . Diabetes mellitus without complication    . Hypertensive disorder    . Arthritis      rheumatoid       Past Surgical History:     Past Surgical History   Procedure Date   . Joint replacement 2005     BILATERAL THR   . Fracture surgery    . Eye surgery    . Hysterectomy 1980'S   . Block, epidural steroid / lumb-sac 12/18/2012     Procedure: BLOCK, EPIDURAL STEROID / LUMB-SAC;  Surgeon: Caro Laroche, MD;  Location: [facility];  Service: Anesthesiology;  Laterality: N/A;   . Block, epidural steroid / lumb-sac 01/01/2013     Procedure: BLOCK, EPIDURAL STEROID / LUMB-SAC;  Surgeon: Caro Laroche, MD;  Location: [facility];  Service: Anesthesiology;  Laterality: N/A;       Family History:   History reviewed. No pertinent family history.    Social History:     History     Social History   . Marital Status: Widowed     Spouse Name: N/A     Number of Children: N/A   . Years of Education: N/A     Social History Main Topics   . Smoking status: Never Smoker    . Smokeless tobacco: Not on file   . Alcohol Use: No   . Drug Use: No   . Sexually Active:      Other Topics Concern   . Not on file     Social History Narrative   . No narrative on file       Allergies:     Allergies   Allergen Reactions   . Azithromycin    . Dilaudid (Hydromorphone Hcl)    . Erythromycin    . Iodine      Iv contrast       Medications:     Current Facility-Administered Medications   Medication Dose Route Frequency   .  amLODIPine  5 mg Oral Daily   . amoxicillin-clavulanate  1 tablet Oral Q12H SCH   . aspirin  81 mg Oral Daily   . gabapentin  400 mg Oral BID   . insulin glargine  10 Units Subcutaneous QHS   . lisinopril  2.5 mg Oral Daily   . naproxen  500 mg Oral BID Meals   . pantoprazole  40 mg Oral QAM AC   . predniSONE  5 mg Oral QAM W/BREAKFAST   . senna-docusate  1 tablet Oral BID   . sucralfate  1 g Oral Daily   . vitamin B-12  500 mcg Oral Daily       Review of Systems:   General ROS: negative for - fever   Respiratory ROS: negative for - cough or shortness of breath   Cardiovascular ROS: Negative for chest pain   Gastrointestinal ROS:  Negative for - abdominal pain        Physical Exam:     Filed Vitals:    01/12/13 0617   BP: 135/62   Pulse: 62   Temp: 96.9 F (36.1 C)   Resp: 18   SpO2: 100%       Patient is alert and oriented to month (sebtemper) Year (2014) day (Thursday) she was able to tell her full name and date of birth. President (obama). L  Follows commands  Speech: normal comprehension,able to name and repeat.  CN: PERLA,EOMI,No facial asymmetry. Tongue is midline.  Motor  Observation: No atrophy or fasciculation  Tone: WNL B/L  Power: No pronation drift. Had joint deformity because of RA,  Power -5/5 in the right U/E and difficlut to examine on the left due to pain  Reflexes is 0 all over   Planter response: dawn going B/L   Sensation: intact to light touch  Coordination: Normal F-N testing B/L.            Labs Reviewed:     Results     Procedure Component Value Units Date/Time    POCT glucose (AC and HS) [[id number]]  (Abnormal) Collected:01/12/13 1221     POCT Glucose WB 122 (A) mg/dL WUJWJXB:14/78/29 5621    POCT glucose (AC and HS) [[id number]] Collected:01/12/13 0753     POCT Glucose WB 94 mg/dL NGEXBMW:41/32/44 0102    Comprehensive metabolic panel [[id number]]  (Abnormal) Collected:01/12/13 0504    Specimen Information:Blood Updated:01/12/13 0558     Glucose 99 mg/dL      BUN 15 mg/dL      Creatinine 0.7  mg/dL      Sodium 347      Potassium 3.7      Chloride 100      CO2 29      CALCIUM 7.9 mg/dL      Protein, Total 6.0 g/dL      Albumin 2.2 (L) g/dL      AST (SGOT) 17 U/L      ALT 21 U/L      Alkaline Phosphatase 180 (H) U/L      Bilirubin, Total 0.3 mg/dL      Globulin 3.8 (H) g/dL      Albumin/Globulin Ratio 0.6 (L)     GFR [[id number]] Collected:01/12/13 0504     EGFR >60.0 Updated:01/12/13 0558    B-type Natriuretic Peptide [[id number]] Collected:01/12/13 0504    Specimen Information:Blood Updated:01/12/13 0554     B-Natriuretic Peptide 90 pg/mL     CBC without differential [[id number]]  (Abnormal) Collected:01/12/13 0504    Specimen Information:Blood / Blood Updated:01/12/13 0532     WBC 8.07      RBC 3.42 (L)      Hgb 10.2 (L) g/dL      Hematocrit 01.6 (L) %      MCV 92.7 fL      MCH 29.8 pg      MCHC 32.2 g/dL      RDW 15 %      Platelets 235      MPV 11.2 fL     POCT glucose (AC and HS) [[id number]]  (Abnormal) Collected:01/11/13 2142     POCT Glucose WB 160 (A) mg/dL DDUKGUR:42/70/62 3762    POCT glucose (AC and HS) [[id number]]  (Abnormal) Collected:01/11/13 1630     POCT Glucose WB 283 (A) mg/dL WVPXTGG:26/94/85 4627    CULTURE BLOOD AEROBIC AND ANAEROBIC [[id number]] Collected:01/08/13 1154    Specimen Information:Blood, Venipuncture Updated:01/11/13 1615  Narrative:    ORDER#: [id number]                                    ORDERED BY: Claudette Head  SOURCE: Blood, Venipuncture peripheral               COLLECTED:  01/08/13 11:54  ANTIBIOTICS AT COLL.:                                RECEIVED :  01/08/13 15:29  Culture Blood Aerobic and Anaerobic        PRELIM      01/11/13 16:15  01/09/13   No Growth after 1 day/s of incubation.  01/10/13   No Growth after 2 day/s of incubation.  01/11/13   No Growth after 3 day/s of incubation.                Rads:   Radiological Procedure reviewed.     Signed by: Pryor Montes

## 2013-01-13 ENCOUNTER — Inpatient Hospital Stay: Payer: Medicare Other

## 2013-01-13 LAB — POCT GLUCOSE
Whole Blood Glucose POCT: 83 mg/dL (ref 70–100)
Whole Blood Glucose POCT: 197 mg/dL — AB (ref 70–100)
Whole Blood Glucose POCT: 185 mg/dL — AB (ref 70–100)
Whole Blood Glucose POCT: 145 mg/dL — AB (ref 70–100)

## 2013-01-13 MED ORDER — ACETAMINOPHEN 500 MG PO TABS
500.0000 mg | ORAL_TABLET | Freq: Four times a day (QID) | ORAL | Status: DC | PRN
Start: 2013-01-13 — End: 2013-01-16

## 2013-01-13 MED ORDER — SENNOSIDES-DOCUSATE SODIUM 8.6-50 MG PO TABS
1.0000 | ORAL_TABLET | Freq: Every day | ORAL | Status: AC
Start: 2013-01-13 — End: ?

## 2013-01-13 MED ORDER — CEFUROXIME AXETIL 250 MG PO TABS
250.0000 mg | ORAL_TABLET | Freq: Two times a day (BID) | ORAL | Status: DC
Start: 2013-01-13 — End: 2013-01-14
  Administered 2013-01-13 – 2013-01-14 (×2): 250 mg via ORAL
  Filled 2013-01-13 (×2): qty 1

## 2013-01-13 MED ORDER — GLIPIZIDE 5 MG PO TABS
5.0000 mg | ORAL_TABLET | ORAL | Status: DC
Start: 2013-01-13 — End: 2013-01-16

## 2013-01-13 MED ORDER — BISACODYL 10 MG RE SUPP
10.0000 mg | Freq: Four times a day (QID) | RECTAL | Status: DC
Start: 2013-01-13 — End: 2013-01-14
  Administered 2013-01-13 (×2): 10 mg via RECTAL
  Filled 2013-01-13 (×2): qty 1

## 2013-01-13 NOTE — Progress Notes (Signed)
Palliative Care Progress Note - [facility]    Date Time: 01/13/2013 11:15 AM  Patient Name: [name]  Attending Physician: Dorathy Daft, MD    Assessment/Plan:   Pain -none   Poor appetite. No dysphagia. Tolerated breakfast well .  Constipation: yet to pass stool. Vomitting. Dulcolax q6hrly till stool.   Confusion : she appears at baseline. Oriented and appropriate.   Weakness: multifactorial. PT when medically cleared.  Advance Care Plan -limited DNR   Disposition -SNF when medically cleared      Chief Complaint:   Feeling weak    Interval History:   Patient states not happy. Vague description. Says feels weak and sad. Vomitus x 2 yesterday. Yet to pass stool. Antibiotics changed    Past Medical History:     Past Medical History   Diagnosis Date   . Diabetes mellitus without complication    . Hypertensive disorder    . Arthritis      rheumatoid       Social History:   Code status: DNAR with Support     Allergies:     Allergies   Allergen Reactions   . Azithromycin    . Dilaudid (Hydromorphone Hcl)    . Erythromycin    . Iodine      Iv contrast       Medications:   Medication list/MAR reviewed.    Review of Systems:   Negative other than described above.    Physical Exam:   VS:   Filed Vitals:    01/13/13 0911   BP: 146/75   Pulse: 78   Temp:    Resp:    SpO2:      GENERAL:appears unhappy  EYES: PERRLA. Sclera anicteric. Conjunctivae pink.  ENT: Oral mucosa moist.  NECK: Trachea midline. Neck veins flat. No adenopathy.  HEART: RRR. Normal S1, S2. No murmur appreciated.  CHEST: Breath sounds are clear bilaterally.  ABDOMEN: Soft. Non-tender. Non-distended. BS+.  GU: Deferred.  RECTAL: Deferred.  EXTREMITIES: No edema. No clubbing. No cyanosis.  SKIN: No rash or lesion.  PSYCH: Alert. Oriented x 3. Appropriate.  NEURO: Moves all extremities. No focal weakness.      Labs/Diagnostics:   Reviewed.  Wt Readings from Last 5 Encounters:   01/07/13 54.432 kg (120 lb)   12/18/12 58.06 kg (128 lb)   12/18/12 58.06 kg  (128 lb)   07/14/12 58.469 kg (128 lb 14.4 oz)   05/31/12 62.869 kg (138 lb 9.6 oz)       Time in: 9.30am/Time out: 10am. More than 50% of this visit was spent in face to face consultation and discussion of diagnosis, prognosis, and treatment options.  Signed by: Walker Shadow, MD  [facility]  [phone]

## 2013-01-13 NOTE — PT Progress Note (Signed)
[facility]  [address]  [place] [address]  [phone]    Physical Therapy Treatment    Patient:  [name]        MRN#:  [id number]  Unit:  3B ONCOLOGY GENERAL MEDICINE        Room/Bed:  A213/Y865-78    Medical Diagnosis: Hyponatremia [276.1]  General weakness [780.79]  General weakness    Time of treatment:  PT Received On: 01/13/13  Start Time: 1135 Stop Time: 1200  Time Calculation (min): 25 min    Treatment #: PT Visit Number: 2/3    Patient's medical condition is appropriate for Physical Therapy intervention at this time.    Precautions: falls, contact isolation    Subjective: "I don't feel good."  Patient is agreeable to participation in the therapy session. Nursing clears patient for therapy.  Pain: pt denies pain    Vitals:   Filed Vitals:    01/12/13 1400 01/12/13 2027 01/13/13 0553 01/13/13 0911   BP: 138/60 149/69 129/69 146/75   Pulse: 65 73 79 78   Temp: 97.7 F (36.5 C) 97.9 F (36.6 C) 97.1 F (36.2 C)    Resp: 16 16 16     SpO2: 99% 97% 97%      Objective:  Pt received seated in bedside chair w/ episode of N/V.  IV access intact.    Functional Mobility:  Rolling: dependent  Scooting: dependent w/ 2  Sit to Supine: dependent  Transfer Training: dependent w/ 2 for squat pivot transfer from chair to bed.    Balance Training:  Poor sitting balance    Educated the patient to role of physical therapy, plan of care, goals  of therapy and safety with mobility and ADLs.    Patient left in bed with all needs met, equipment intact and call bell within reach. RN notified of session outcome.  Updated mobility status posted at bedside.    Assessment:  Pt very lethargic today.  Continues to follow commands, very minimally verbal.  Pt continuously vomiting throughout transfer back to bed.  RN present and aware.  Pt will continue to benefit from PT services while admitted to address functional deficits.      Goals per Eval/ Re-eval:   Goal Formulation: Patient unable to participate  in goal setting  Time for Goal Acheivement: 3 visits  Pt Will Go Supine To Sit: With moderate assist;to maximize functional mobility and independence  Pt Will Transfer Bed/Chair: With moderate assist;to maximize functional mobility and independence    Plan:  Discharge Recommendation: SNF  PT Frequency: 2-3x/wk    Continue plan of care.    Signature:  Prince Solian, PT  01/13/2013 12:11 PM   Phone: [phone]

## 2013-01-13 NOTE — Plan of Care (Signed)
Pt alert and oriented. Afebrile. VSS. SR on tele. No N/V noted. BG at bed time was 136, no coverage needed per sliding scale. No glycemic rxn. Took pm meds and tolerated well.  Incontinent care as needed and TARP q2 hrs as tolerated. Contact isolation maintained as ordered. Fall precaution is in place. Denies any pain and is sleeping at this moment.

## 2013-01-13 NOTE — Progress Notes (Signed)
[name]  79 y.o.     female    NUTRITION:  Reason for assessment: Length of Stay    Assessment   Subjective : None    Past Medical History   Diagnosis Date   . Diabetes mellitus without complication    . Hypertensive disorder    . Arthritis      rheumatoid       Active Hospital Problems    Diagnosis   . General weakness       Diet History:      History     Social History   . Marital Status: Widowed     Spouse Name: N/A     Number of Children: N/A   . Years of Education: N/A     Occupational History   . Not on file.     Social History Main Topics   . Smoking status: Never Smoker    . Smokeless tobacco: Not on file   . Alcohol Use: No   . Drug Use: No   . Sexually Active:      Other Topics Concern   . Not on file     Social History Narrative   . No narrative on file       GI Sxs: vomiting  Skin: Intact      Current Diet Order  Diet dysphagia PUREED Liquid consistency:: Thin  <50 % po intake; vomiting as per nursing.    Current Meds:       amLODIPine 5 mg Daily   amoxicillin-clavulanate 1 tablet Q12H [facility]   aspirin 81 mg Daily   bisacodyl 10 mg Q6H SCH   gabapentin 400 mg BID   insulin glargine 10 Units QHS   lisinopril 2.5 mg Daily   naproxen 500 mg BID Meals   pantoprazole 40 mg QAM AC   predniSONE 5 mg QAM W/BREAKFAST   senna-docusate 1 tablet BID   sucralfate 1 g Daily   vitamin B-12 500 mcg Daily              No intake or output data in the 24 hours ending 01/13/13 1239      Recent Labs:      Lab 01/12/13 0504 01/11/13 0554 01/10/13 0648   WBC 8.07 7.24 9.28   HGB 10.2* 10.2* 10.8*   HCT 31.7* 31.1* 33.0*   MCV 92.7 90.7 91.2   PLT 235 213 191         Lab 01/12/13 0504 01/11/13 0554 01/09/13 1829 01/07/13 1306   NA 137 135* 129* 131*   K 3.7 3.4* 3.7 3.9   CL 100 99 95* 90*   CO2 29 26 22 28    BUN 15 12 11 13    CREAT 0.7 0.7 0.8 0.8   GLU 99 132* 350* 222*   CA 7.9 7.8* 7.9 9.1   MG -- -- -- --   PHOS -- -- -- --             Anthropometrics  Height: 175.3 cm (5' 9.02")  Weight: 54.432 kg (120 lb)  Weight  Change: 0   IBW/kg (Calculated) Female: 72.79 kg  IBW/kg (Calculated) Female: 65.93 kg  BMI (calculated): 17.7     Estimated Energy Needs  Total Energy Estimated Needs: 1400-1700 cal  Method for Estimating Needs: IBW x ( 25-30 ) cal         Estimated Protein Needs  Total Protein Estimated Needs: 57-68 g  Method for Estimating Needs: IBW x (  1-1.2 ) g              Fluid Needs  Total Fluid Estimated Needs: 1425 ml  Method for Estimating Needs: IBW x 25 ml             Allergies   Allergen Reactions   . Azithromycin    . Dilaudid (Hydromorphone Hcl)    . Erythromycin    . Iodine      Iv contrast         Learning Needs: None      Nutrition Diagnosis:   Intake : Ni-2.1 Inadequate Oral Intake   Related to decreased ability to consume sufficient energy as evidenced by poor po intake and vomiting.  Nc-3.1 Underweight  Related to increase energy needs as evidenced by BMI <18 and poor po intake    Intervention:  Recommend : Boost breeze TID    Goals:  Pt maintains 50% or more po intake    M/E:  Monitor: labs, po intake and tolerance to diet  Follow up @ moderate risk      Lurline Hare MS. RD [address]  01/13/13 @ 1252

## 2013-01-13 NOTE — Progress Notes (Signed)
[facility]ICINE PROGRESS NOTE    Date Time: 01/13/2013 12:43 PM  Patient Name: [name]        Subjective:   Feeling very weak , also per nurse , had vomiting again after breakfast .  No BM yet     Medications:   Scheduled Meds:  Current Facility-Administered Medications   Medication Dose Route Frequency   . amLODIPine  5 mg Oral Daily   . amoxicillin-clavulanate  1 tablet Oral Q12H SCH   . aspirin  81 mg Oral Daily   . bisacodyl  10 mg Rectal Q6H SCH   . gabapentin  400 mg Oral BID   . insulin glargine  10 Units Subcutaneous QHS   . lisinopril  2.5 mg Oral Daily   . naproxen  500 mg Oral BID Meals   . pantoprazole  40 mg Oral QAM AC   . predniSONE  5 mg Oral QAM W/BREAKFAST   . senna-docusate  1 tablet Oral BID   . sucralfate  1 g Oral Daily   . vitamin B-12  500 mcg Oral Daily     Continuous Infusions:   PRN Meds:.acetaminophen, dextrose, dextrose, glucagon (rDNA), hydrALAZINE, insulin aspart, insulin aspart, ondansetron, oxyCODONE-acetaminophen, Ultram + Acetaminophen 50-325 combo dose      Review of Systems:   A comprehensive review of systems was: History obtained from chart review and the patient  General ROS: weak, no fever, no chills  Respiratory ROS: no cough, shortness of breath, or wheezing  Cardiovascular ROS: no chest pain   Gastrointestinal ROS: no abdominal pain,     Physical Exam:     Filed Vitals:    01/12/13 1400 01/12/13 2027 01/13/13 0553 01/13/13 0911   BP: 138/60 149/69 129/69 146/75   Pulse: 65 73 79 78   Temp: 97.7 F (36.5 C) 97.9 F (36.6 C) 97.1 F (36.2 C)    TempSrc:       Resp: 16 16 16     Height:       Weight:       SpO2: 99% 97% 97%        General appearance - alert, well appearing, and in no distress, frail elderly female  Mental status - alert, oriented to person, place, and time  Chest - clear to auscultation, no wheezes, rales or rhonchi, symmetric air entry  Heart - normal rate, regular rhythm, normal S1, S2, no murmurs, rubs, clicks or gallops  Abdomen -  bowel sounds normal, Abdomen is soft, nontender, nondistended  Neuro unchanged   Labs:     Results     Procedure Component Value Units Date/Time    POCT glucose (AC and HS) [[id number]]  (Abnormal) Collected:01/13/13 1140    Specimen Information:Blood Updated:01/13/13 1214     POCT Glucose WB 197 (A) mg/dL     POCT glucose (AC and HS) [[id number]] Collected:01/13/13 0715    Specimen Information:Blood Updated:01/13/13 0758     POCT Glucose WB 83 mg/dL     POCT glucose (AC and HS) [[id number]]  (Abnormal) Collected:01/12/13 2047     POCT Glucose WB 136 (A) mg/dL NGEXBMW:41/32/44 0102    POCT glucose (AC and HS) [[id number]]  (Abnormal) Collected:01/12/13 1630     POCT Glucose WB 123 (A) mg/dL HKVQQVZ:56/38/75 6433    CULTURE BLOOD AEROBIC AND ANAEROBIC [[id number]] Collected:01/08/13 1154    Specimen Information:Blood, Venipuncture Updated:01/12/13 1615    Narrative:    ORDER#: [id number]  ORDERED BY: Juaquina Machnik  SOURCE: Blood, Venipuncture peripheral               COLLECTED:  01/08/13 11:54  ANTIBIOTICS AT COLL.:                                RECEIVED :  01/08/13 15:29  Culture Blood Aerobic and Anaerobic        PRELIM      01/12/13 16:15  01/09/13   No Growth after 1 day/s of incubation.  01/10/13   No Growth after 2 day/s of incubation.  01/11/13   No Growth after 3 day/s of incubation.  01/12/13   No Growth after 4 day/s of incubation.              Lab 01/13/13 1140 01/13/13 0715 01/12/13 2047 01/12/13 1630 01/12/13 1221 01/12/13 0753 01/11/13 2142 01/11/13 1630 01/11/13 1204 01/11/13 0810   GLUCOSEWHOLE 197* 83 136* 123* 122* 94 160* 283* 180* 127*           Rads:     Radiology Results (24 Hour)     ** No Results found for the last 24 hours. **          Assessment and Plan:     Generalized weakness likely due to acute febrile illness ? Viral vs bacterial and has improved with IVF and antibiotics, iv discontinued   DM fair  Hyponatremia resolved   Rheumatoid Arthritis  cxray  increasing small effusion ,   Constipation   Vomiting , no abd pain   PLAN:  Pt eval  Change IV antibiotics to po,  IVF and see if patient can maintain hydration, continue therapy, discharge planning to SNF likely,  BNP not impressive .   Pt eval   Neurology will be following today ,D/w neuro , get repeat Ct head .   Bowel management   Antiemetics , see if tolerates her lunch     Signed by: Dorathy Daft, MD

## 2013-01-13 NOTE — Progress Notes (Addendum)
Patient alert,follow commands.ate 80% of her breakfast.Had emesis immediately after breakfast.zofran IV prn given to patient.pt does not have bowel movement from a week per previous shift nurse report.notified to MD.Dulcolax suppository given to patient.suctioned patient as needed.family at bedside.blood sugar maintained.incontinent care given to patient.Turn and repositioned q2hrs.

## 2013-01-13 NOTE — Progress Notes (Signed)
Patient alert ,received back from radiology at 3:50pm.family at bedside.

## 2013-01-13 NOTE — Progress Notes (Signed)
Patient alert,no further episodes of emesis noted.no bowel movement yet.patient left the floor at 15:30 for CT head.

## 2013-01-13 NOTE — Progress Notes (Addendum)
Patient alert,family at bedside,having bowel movement this evening.running normal sinus rhythm on cardiac monitor.

## 2013-01-13 NOTE — Plan of Care (Signed)
Problem: Physical Therapy  Goal: Mobility level is maintained or improved  PT Plan  Treatment/Interventions: Exercise;Neuromuscular re-education;Functional transfer training;LE strengthening/ROM;Cognitive reorientation;Endurance training  Recommendation  Discharge Recommendation: SNF  PT Frequency: 2-3x/wk    Patient is agreeable to the following Goals:   Goals  Goal Formulation: Patient unable to participate in goal setting  Time for Goal Acheivement: 3 visits  Goals: Select goal  Pt Will Go Supine To Sit: With moderate assist;to maximize functional mobility and independence  Pt Will Transfer Bed/Chair: With moderate assist;to maximize functional mobility and independence   Outcome: Progressing  Pt very lethargic today.  Continues to follow commands, very minimally verbal.  Pt continuously vomiting throughout transfer back to bed.  RN present and aware.  Pt will continue to benefit from PT services while admitted to address functional deficits.

## 2013-01-14 ENCOUNTER — Inpatient Hospital Stay: Payer: Medicare Other

## 2013-01-14 LAB — BASIC METABOLIC PANEL
BUN: 33 mg/dL — ABNORMAL HIGH (ref 7–21)
CO2: 26 (ref 22–29)
Calcium: 8.5 mg/dL (ref 7.9–10.6)
Chloride: 99 (ref 98–107)
Creatinine: 1.3 mg/dL — ABNORMAL HIGH (ref 0.6–1.0)
Glucose: 202 mg/dL — ABNORMAL HIGH (ref 70–100)
Potassium: 4.7 (ref 3.5–5.1)
Sodium: 137 (ref 136–145)

## 2013-01-14 LAB — CBC
Hematocrit: 33.2 % — ABNORMAL LOW (ref 37.0–47.0)
Hgb: 10.4 g/dL — ABNORMAL LOW (ref 12.0–16.0)
MCH: 29.5 pg (ref 28.0–32.0)
MCHC: 31.3 g/dL — ABNORMAL LOW (ref 32.0–36.0)
MCV: 94.1 fL (ref 80.0–100.0)
MPV: 10.6 fL (ref 9.4–12.3)
Nucleated RBC: 0 (ref 0–1)
Platelets: 287 (ref 140–400)
RBC: 3.53 — ABNORMAL LOW (ref 4.20–5.40)
RDW: 15 % (ref 12–15)
WBC: 14.02 — ABNORMAL HIGH (ref 3.50–10.80)

## 2013-01-14 LAB — POCT GLUCOSE
Whole Blood Glucose POCT: 120 mg/dL — AB (ref 70–100)
Whole Blood Glucose POCT: 241 mg/dL — AB (ref 70–100)
Whole Blood Glucose POCT: 199 mg/dL — AB (ref 70–100)
Whole Blood Glucose POCT: 241 mg/dL — AB (ref 70–100)

## 2013-01-14 LAB — GFR: EGFR: 46.1

## 2013-01-14 MED ORDER — HEPARIN SODIUM (PORCINE) 5000 UNIT/ML IJ SOLN
5000.0000 [IU] | Freq: Two times a day (BID) | INTRAMUSCULAR | Status: DC
Start: 2013-01-14 — End: 2013-01-15
  Administered 2013-01-14 – 2013-01-15 (×3): 5000 [IU] via SUBCUTANEOUS
  Filled 2013-01-14 (×3): qty 1

## 2013-01-14 MED ORDER — FLUCONAZOLE IN SODIUM CHLORIDE 200-0.9 MG/100ML-% IV SOLN
200.0000 mg | INTRAVENOUS | Status: DC
Start: 2013-01-14 — End: 2013-01-16
  Administered 2013-01-14 – 2013-01-15 (×2): 200 mg via INTRAVENOUS
  Filled 2013-01-14 (×3): qty 100

## 2013-01-14 MED ORDER — SODIUM CHLORIDE 0.9 % IV SOLN
INTRAVENOUS | Status: AC
Start: 2013-01-14 — End: 2013-01-15

## 2013-01-14 MED ORDER — SODIUM CHLORIDE 0.9 % IV MBP
2.2500 g | Freq: Four times a day (QID) | INTRAVENOUS | Status: DC
Start: 2013-01-14 — End: 2013-01-16
  Administered 2013-01-14 – 2013-01-16 (×7): 2.25 g via INTRAVENOUS
  Filled 2013-01-14 (×11): qty 2.25

## 2013-01-14 NOTE — Plan of Care (Signed)
Patient is slightly lethargic. States that she is tired. Is AO X 4. No sign and symptom of acute respiratory distress. Denies pain. Turned and repositioned. Wound on the left inner upper buttock is healed. Skin is intact. Had BM X 1. Incontinent care provided. Oral care provided. On contact isolation for MDR. Blood sugar was 145. No coverage. Will continue plan of care.

## 2013-01-14 NOTE — Plan of Care (Signed)
Problem: Occupational Therapy  Goal: Self-care - activities of daily living  OT Plan  Treatment Interventions: ADL retraining;Functional transfer training;UE strengthening/ROM;Endurance training;Cognitive reorientation;Patient/Family training;Equipment eval/education;Compensatory technique education;Fine motor coordination activities;Continued evaluation  Discharge Recommendation: SNF  DME Recommended for Discharge: (TBA)  OT Frequency Recommended: 2-3x/wk    Patient is agreeable to the following Goals:   Goal Formulation: Patient  Time For Goal Achievement: 5 visits  ADL Goals  Patient will feed self: independently;Goal met  Patient will groom self: with minimal assist  Patient will dress upper body: with minimal assist  Pt will complete bathing: With minimal assist (UE bathing)  Mobility and Transfer Goals  Pt will transfer bed to [facility]: with moderate assist    Musculoskeletal Goals  Pt will perform Home Exercise Program: independently;to increase engagement in ADLs  Outcome: Not Progressing  Pt not progressing towards OT goals today; pt was very lethargic and had poor activity tolerance. Pt also reports pain in LUE limited her ability to complete ADLs. Pt's ability to complete ADLs and functional transfers is impaired due to the following deficits: pain, activity tolerance, strength, decreased AROM/GMC in LUE due to pain, and balance. Pt would continue to benefit from OT to address these deficits and increase independence with ADLs and functional transfers.

## 2013-01-14 NOTE — Consults (Signed)
INFECTIOUS DISEASES CONSULTATION  [facility]  [phone] [facility] [address]   [address]  [place], [place] [phone]      Date Time: 01/14/2013 6:47 PM  Patient Name: [name], [name]  Requesting Physician: Dorathy Daft, MD      Reason for Consultation:   Leukocytosis  Recent fever     Assessment:     Patient Active Problem List   Diagnosis   . Type II or unspecified type diabetes mellitus with neurological manifestations, not stated as uncontrolled(250.60)   . Atherosclerosis of native arteries of the extremities, unspecified   . Type II or unspecified type diabetes mellitus with neurological manifestations, not stated as uncontrolled(250.60)   . Chronic venous insufficiency   . CHF (congestive heart failure)   . HTN (hypertension)   . History of DVT of lower extremity   . GERD (gastroesophageal reflux disease)   . RA (rheumatoid arthritis)   . Varicose veins of lower extremities with ulcer   . Anemia   . Diverticulitis   . AKI (acute kidney injury)   . CKD (chronic kidney disease), stage III   . Chest pain   . General weakness     The patient is a 79 y.o. female admitted on 01/07/2013 with FTT, malaise, myalgias/arthralgia/ dec po intake, weight loss..     Source of fever and leukocytosis unclear.  ?gi source/intrabdominal source/?colitis        Recommendation:     Resume zosyn 2.25 q 6 hrs   Start Diflulcan 200 mg iv q 24  Ct abd/pelvis c po only contrast      History of Pesent Illness:   [name] is a 79 y.o. female with DM, RA maintained On prednisone and mtx  Who presented [facility] on 01/07/13 with c/o  Generalized weakness,myalgias and arthralgias,  decreased appetite and failure To thrive.  On presentation she was afebrile with temp 98.7, bp 14/68,   Pulse 73 and 02 sat 99%.  She was found to have retained food in the oral pharynx  ekg was unremarkable  Wbc 8.52  hgb 11.7 c 74 p, 15 l, 10 monos.  Glucose 222 bun/creat 13/0.8  Na 131  Ua was  unremarkable    Recently, on  9/4, pt apparently had  Been treated with an l4-l5 epidural block.    On the day post admit, 9/11 there was concern that the  Pt had had a facial droop, left arm weakness, and unclear speech, and increased confusion.. She had spike a temp to 101.1 9 am, and spiked again at 3pm to 101.2         Zosyn tx was initiated on 9/11 as empiric tx for possible  Aspiration     On 9/12 bp incrased to 210/83  tmax of 100.6 9/12 pm  On 9/12, pt performed well on swallowing evaluation -  With thin liquids and puree.   Noted to have ill-fitting   Dentures.    On 9/13 am had temp 100.5 and has been afebrile since  That time.  On 9/15, noted to have constipation - no bm x 4 days.  Senna- s ordered  Had N/V x 2  Hx wt loss note since 2/14 -  62.8 kg down to 54.4 lbs.    On 9/16 .   ?more lethargic   Vomited after eating 80% breakfast .  Finally had bm in evening - and subsequently a couple  More since then  Medications:     Current Facility-Administered Medications   Medication Dose Route Frequency   . amLODIPine  5 mg Oral Daily   . aspirin  81 mg Oral Daily   . cefuroxime  250 mg Oral Q12H SCH   . gabapentin  400 mg Oral BID   . heparin (porcine)  5,000 Units Subcutaneous Q12H [facility]   . insulin glargine  10 Units Subcutaneous QHS   . lisinopril  2.5 mg Oral Daily   . pantoprazole  40 mg Oral QAM AC   . predniSONE  5 mg Oral QAM W/BREAKFAST   . senna-docusate  1 tablet Oral BID   . sucralfate  1 g Oral Daily   . vitamin B-12  500 mcg Oral Daily   . [DISCONTINUED] bisacodyl  10 mg Rectal Q6H SCH   . [DISCONTINUED] naproxen  500 mg Oral BID Meals       Antibiotics:   Zosyn 9/11-9/14  Augmentin 9/15-9/16  Ceftin 9/16-9/17  Prednisone 5mg  qd    Allergies:     Allergies   Allergen Reactions   . Azithromycin    . Dilaudid (Hydromorphone Hcl)    . Erythromycin    . Iodine      Iv contrast       Past Medical History:     Past Medical History   Diagnosis Date   . Diabetes mellitus without complication    .  Hypertensive disorder    . Arthritis      rheumatoid       Past Surgical History:     Past Surgical History   Procedure Date   . Joint replacement 2005     BILATERAL THR   . Fracture surgery    . Eye surgery    . Hysterectomy 1980'S   . Block, epidural steroid / lumb-sac 12/18/2012     Procedure: BLOCK, EPIDURAL STEROID / LUMB-SAC;  Surgeon: Caro Laroche, MD;  Location: [facility];  Service: Anesthesiology;  Laterality: N/A;   . Block, epidural steroid / lumb-sac 01/01/2013     Procedure: BLOCK, EPIDURAL STEROID / LUMB-SAC;  Surgeon: Caro Laroche, MD;  Location: [facility];  Service: Anesthesiology;  Laterality: N/A;       Social History:     History     Social History   . Marital Status: Widowed     Spouse Name: N/A     Number of Children: N/A   . Years of Education: N/A     Social History Main Topics   . Smoking status: Never Smoker    . Smokeless tobacco: Not on file   . Alcohol Use: No   . Drug Use: No   . Sexually Active:      Other Topics Concern   . Not on file     Social History Narrative   . No narrative on file     No recent travel  No known tick exposure/insect exposure      Family History:   History reviewed. No pertinent family history.  No ill contacts in the family  Review of Systems:   + Fevers on admission No, Chills, Rigors  + myalgias, arthralgias  +malaise and fatigue  +dec po intake  + weight loss  Did well on slp eval - no dysphagia   No HA  No Neck Stiffness  No sore throat, dysphagia, or odynophagia  No cough, Chest Pain, or shortness of breath  No N/V/D  No abdominal pain  No dysuria, frequency, hesitancy,  hematuria   No flank pain/No CVAT  No itching or rash  No focal neurologic complaints- generalized weakness/fatigue      Physical Exam:     Filed Vitals:    01/13/13 1954 01/14/13 0615 01/14/13 0900 01/14/13 1400   BP: 109/55 112/52 119/58 117/55   Pulse: 85 74 76 80   Temp: 98.5 F (36.9 C) 98.4 F (36.9 C)  98.2 F (36.8 C)   TempSrc:       Resp: 16 16  24    Height:       Weight:       SpO2: 98% 94%   100%       Temp (24hrs), Avg:98.4 F (36.9 C), Min:98.2 F (36.8 C), Max:98.5 F (36.9 C)      peripheral iv    Active PICC Line / CVC Line / PIV Line / Drain / Airway / Intraosseous Line / Epidural Line / ART Line / Line / Wound / Pressure Ulcer / NG/OG Tube     Name   Placement date   Placement time   Site   Days    Peripheral IV 01/10/13 Right Forearm  01/10/13   1130   Forearm   4    Wound (Miscellaneous) 01/07/13 Other (Comment) Buttocks Left;Upper;Inner stage 2  01/07/13   2045   Buttocks   6          Intake and Output Summary (Last 24 hours) at Date Time  No intake or output data in the 24 hours ending 01/14/13 1847    Thin elderly female in NAD  Sclera anicteric, conjunctivae clear  Oral pharynx s erythema, exudate, or other significant lesions  No Thrush  Neck supple, s lymphadenopathy  Lungs bbs  Heart Regular rate and rhythm s murmurs, rubs, or gallops  Abdomen Soft, non tender, non distended, normal bowel sounds  Back No Costovertebral angle tenderness, no point tenderness along the spine  Extremities No Cyanosis, no clubbing, or edema  Palpable pedal pulses  Neurologic exam is grossly non-focal  Awake, alert and pleasantly confused  Skin without rash or other significant lesions      Labs Reviewed:     Results     Procedure Component Value Units Date/Time    POCT glucose (AC and HS) [[id number]]  (Abnormal) Collected:01/14/13 1653     POCT Glucose WB 199 (A) mg/dL BMWUXLK:44/01/02 7253    POCT glucose (AC and HS) [[id number]]  (Abnormal) Collected:01/14/13 1145     POCT Glucose WB 241 (A) mg/dL QVZDGLO:75/64/33 2951    Basic Metabolic Panel [[id number]]  (Abnormal) Collected:01/14/13 0937    Specimen Information:Blood Updated:01/14/13 1002     Glucose 202 (H) mg/dL      BUN 33 (H) mg/dL      Creatinine 1.3 (H) mg/dL      CALCIUM 8.5 mg/dL      Sodium 016      Potassium 4.7      Chloride 99      CO2 26     GFR [[id number]] Collected:01/14/13 0937     EGFR 46.1 Updated:01/14/13 1002    CBC without  differential [[id number]]  (Abnormal) Collected:01/14/13 0937    Specimen Information:Blood / Blood Updated:01/14/13 0946     WBC 14.02 (H)      RBC 3.53 (L)      Hgb 10.4 (L) g/dL      Hematocrit 70.6 (L) %      MCV 94.1 fL      MCH 29.5 pg  MCHC 31.3 (L) g/dL      RDW 15 %      Platelets 287      MPV 10.6 fL      Nucleated RBC 0     POCT glucose (AC and HS) [[id number]]  (Abnormal) Collected:01/14/13 0741     POCT Glucose WB 120 (A) mg/dL GUYQIHK:74/25/95 6387    POCT glucose (AC and HS) [[id number]]  (Abnormal) Collected:01/13/13 2129     POCT Glucose WB 145 (A) mg/dL OACZYSA:63/01/60 1093             Lab 01/14/13 0937   WBC 14.02*   HGB 10.4*   HCT 33.2*   PLT 287        Lab 01/14/13 0937 01/12/13 0504   NA 137 --   K 4.7 --   CL 99 --   CO2 26 --   BUN 33* --   CREAT 1.3* --   CA 8.5 --   ALB -- 2.2*   PROT -- 6.0   BILITOTAL -- 0.3   ALKPHOS -- 180*   ALT -- 21   AST -- 17   GLU 202* --     No results found for this basename: INR,APTT in the last 168 hours     Rads:     Radiology Results (24 Hour)     Procedure Component Value Units Date/Time    XR Chest 2 Views [[id number]] Collected:01/14/13 1110    Order Status:Completed  Updated:01/14/13 1116    Narrative:    INDICATION: Dyspnea.    TECHNIQUE: 2 views.    FINDINGS: No acute pulmonary infiltrates are demonstrated. There is a  linear band of density at the left lung base suggestive of subsegmental  atelectasis. The heart is not enlarged. There is calcification of the  thoracic aorta. There is mild blunting of the posterior costophrenic  angles.      Impression:     No acute pulmonary infiltrates. Subsegmental atelectasis at  the left lung base.    Merri Ray, MD   01/14/2013 11:12 AM         9/13  Left pleural effusion  9/17 lll atelectasis    Signed by: Ray Church, MD

## 2013-01-14 NOTE — OT Progress Note (Signed)
[facility]  [address]  [place] [address]  [phone]    Occupational Therapy Treatment    Patient: [name]    MRN#: [id number]  Unit: 3B ONCOLOGY GENERAL MEDICINE         Bed: O756/E332-95    Medical Diagnosis: Hyponatremia [276.1]  General weakness [780.79]  General weakness    Time of treatment:    Time Calculation  OT Received On: 01/14/13  Start Time: 1520  Stop Time: 1535  Time Calculation (min): 15 min    Treatment #  OT Visit Number: 2/5    Precautions and Contraindications:  Fall  Contact isolation     Subjective: "so tired today. I wish I could move this elbow better." (referring to LUE)  Patient's medical condition is appropriate for Occupational Therapy  intervention at this time.  Patient is agreeable to participation in the therapy session. Nursing clears patient for therapy.  Pain: pt reported pain in LUE; did not assign number     Objective:  Patient is in bed with Intravenous Access  in place.     Cognition:  Pt lethargic. Pt required increased time and repetition for commands today.     Self Care:  Grooming: Mod A: seated upright in bed   (further self care activity NT secondary to fatigue)     Functional Mobility:  Rolling: Mod A  (further mobility NT secondary to fatigue)     B UE Exercises: AROM for RUE and AAROM for LUE due to pain:  Shoulder Flexion:  2 X10   Shoulder Abduction: 2 X10   Elbow Flexion: 2 X10   Elbow Extenison: 2 X10   Pronation/Supination: 2 X10   Internal Rotation/External Rotation: 2 X10   Wrist Flexion/Extension: 2 X10   Hand Squeezes: 2 X10     Treatment Activities: ADLs, functional mobility     Observation of Patient:  Vitals:     Filed Vitals:    01/13/13 1954 01/14/13 0615 01/14/13 0900 01/14/13 1400   BP: 109/55 112/52 119/58 117/55   Pulse: 85 74 76 80   Temp: 98.5 F (36.9 C) 98.4 F (36.9 C)  98.2 F (36.8 C)   TempSrc:       Resp: 16 16  24    Height:       Weight:       SpO2: 98% 94%  100%       Education:   Educated the patient  to role of occupational therapy, plan of care,  goals of therapy and HEP, safety with mobility and ADLs.    Patient left in bed with all needs met, equipment intact and call bell within reach. RN notified of session outcome.  Updated status posted at bedside.    Assessment:  Pt not progressing towards OT goals today; pt was very lethargic and had poor activity tolerance. Pt also reports pain in LUE limited her ability to complete ADLs. Pt's ability to complete ADLs and functional transfers is impaired due to the following deficits: pain, activity tolerance, strength, decreased AROM/GMC in LUE due to pain, and balance. Pt would continue to benefit from OT to address these deficits and increase independence with ADLs and functional transfers.     Goals:  Time For Goal Achievement: 5 visits  ADL Goals  Patient will feed self: independently;Goal met  Patient will groom self: with minimal assist  Patient will dress upper body: with minimal assist  Pt will complete bathing: With minimal assist (UE  bathing)  Mobility and Transfer Goals  Pt will transfer bed to [facility]: with moderate assist     Musculoskeletal Goals  Pt will perform Home Exercise Program: independently;to increase engagement in ADLs     OT Plan  Treatment Interventions: ADL retraining;Functional transfer training;UE strengthening/ROM;Endurance training;Cognitive reorientation;Patient/Family training;Equipment eval/education;Compensatory technique education;Fine motor coordination activities;Continued evaluation  Discharge Recommendation: SNF  DME Recommended for Discharge:  (TBA)  OT Frequency Recommended: 2-3x/wk    Continue plan of care.    Signature:   Louie Boston, OT  01/14/2013 3:55 PM   Phone: [phone]

## 2013-01-14 NOTE — Progress Notes (Signed)
Palliative Care Progress Note - [facility]    Date Time: 01/14/2013 11:06 AM  Patient Name: [name]  Attending Physician: Dorathy Daft, MD    Assessment/Plan:   Pain -none    Nausea and vomiting none    Confusion- good long term memory vs short term. otherwise appropriate and conversational    Constipation: relieved. Had 3 big regular BM     Advance Care Plan -full code    Disposition -SNF when medically cleared    Chief Complaint:   none    Interval History:   Feels better today. Thinks she had a BM yesterday. Verified with RN, reports 3 big BM's . No further vomiting. Tolerating some breakfast.    Past Medical History:     Past Medical History   Diagnosis Date   . Diabetes mellitus without complication    . Hypertensive disorder    . Arthritis      rheumatoid       Social History:   Code status: DNAR with Support     Allergies:     Allergies   Allergen Reactions   . Azithromycin    . Dilaudid (Hydromorphone Hcl)    . Erythromycin    . Iodine      Iv contrast       Medications:   Medication list/MAR reviewed.    Review of Systems:   Negative other than described above.    Physical Exam:   VS:   Filed Vitals:    01/14/13 0900   BP: 119/58   Pulse: 76   Temp:    Resp:    SpO2:      GENERAL: Appears in no obvious distress.  EYES: PERRLA. Sclera anicteric. Conjunctivae pink.  ENT: Oral mucosa moist.  NECK: Trachea midline. Neck veins flat. No adenopathy.  HEART: RRR. Normal S1, S2. No murmur appreciated.  CHEST: Breath sounds are clear bilaterally.  ABDOMEN: Soft. Non-tender. Non-distended. BS+.  GU: Deferred.  RECTAL: Deferred.  EXTREMITIES: No edema. No clubbing. No cyanosis.  SKIN: No rash or lesion.  PSYCH: Alert. Oriented x 3. Appropriate.  NEURO: Moves all extremities. No focal weakness.      Labs/Diagnostics:   Reviewed.  Wt Readings from Last 5 Encounters:   01/07/13 54.432 kg (120 lb)   12/18/12 58.06 kg (128 lb)   12/18/12 58.06 kg (128 lb)   07/14/12 58.469 kg (128 lb 14.4 oz)   05/31/12 62.869 kg  (138 lb 9.6 oz)       Time in: 9am/Time out: 9.20am. More than 50% of this visit was spent in face to face consultation and discussion of diagnosis, prognosis, and treatment options.  Signed by: Walker Shadow, MD  [facility]  [phone]

## 2013-01-14 NOTE — Progress Notes (Signed)
Alert,follow commands,blood sugar maintained.

## 2013-01-14 NOTE — Progress Notes (Signed)
[facility] INTERNAL MEDICINE PROGRESS NOTE    Date Time: 01/14/2013 9:24 AM  Patient Name: [name],[name]        Subjective:   Feeling very weak , no  Vomiting, had  BM     Medications:   Scheduled Meds:  Current Facility-Administered Medications   Medication Dose Route Frequency   . amLODIPine  5 mg Oral Daily   . aspirin  81 mg Oral Daily   . cefuroxime  250 mg Oral Q12H SCH   . gabapentin  400 mg Oral BID   . heparin (porcine)  5,000 Units Subcutaneous Q12H [facility]   . insulin glargine  10 Units Subcutaneous QHS   . lisinopril  2.5 mg Oral Daily   . naproxen  500 mg Oral BID [facility]   . pantoprazole  40 mg Oral QAM AC   . predniSONE  5 mg Oral QAM W/BREAKFAST   . senna-docusate  1 tablet Oral BID   . sucralfate  1 g Oral Daily   . vitamin B-12  500 mcg Oral Daily   . [DISCONTINUED] amoxicillin-clavulanate  1 tablet Oral Q12H SCH   . [DISCONTINUED] bisacodyl  10 mg Rectal Q6H SCH     Continuous Infusions:   PRN Meds:.acetaminophen, dextrose, dextrose, glucagon (rDNA), insulin aspart, insulin aspart, ondansetron, oxyCODONE-acetaminophen, Ultram + Acetaminophen 50-325 combo dose, [DISCONTINUED] hydrALAZINE      Review of Systems:   A comprehensive review of systems was: History obtained from chart review and the patient  General ROS: weak, no fever, no chills  Respiratory ROS: no cough, shortness of breath, or wheezing  Cardiovascular ROS: no chest pain   Gastrointestinal ROS: no abdominal pain,     Physical Exam:     Filed Vitals:    01/13/13 1340 01/13/13 1954 01/14/13 0615 01/14/13 0900   BP: 116/49 109/55 112/52 119/58   Pulse: 78 85 74 76   Temp: 97.4 F (36.3 C) 98.5 F (36.9 C) 98.4 F (36.9 C)    TempSrc:       Resp: 20 16 16     Height:       Weight:       SpO2: 98% 98% 94%        General appearance - alert, well appearing, and in no distress, frail elderly female  Mental status - alert, oriented to person, place, and time  Chest - clear to auscultation, no wheezes, rales or rhonchi, symmetric air  entry  Heart - normal rate, regular rhythm, normal S1, S2, no murmurs, rubs, clicks or gallops  Abdomen - bowel sounds normal, Abdomen is soft, sl tender diffuse  , nondistended  Neuro unchanged   Labs:     Results     Procedure Component Value Units Date/Time    POCT glucose (AC and HS) [[id number]]  (Abnormal) Collected:01/14/13 0741     POCT Glucose WB 120 (A) mg/dL [id number]:29/56/21 3086    POCT glucose (AC and HS) [[id number]]  (Abnormal) Collected:01/13/13 2129     POCT Glucose WB 145 (A) mg/dL BMWUXLK:44/01/02 7253    CULTURE BLOOD AEROBIC AND ANAEROBIC [[id number]] Collected:01/08/13 1154    Specimen Information:Blood, Venipuncture Updated:01/13/13 1815    Narrative:    ORDER#: [id number]                                    ORDERED BY: Claudette Head  SOURCE: Blood, Venipuncture peripheral  COLLECTED:  01/08/13 11:54  ANTIBIOTICS AT COLL.:                                RECEIVED :  01/08/13 15:29  Culture Blood Aerobic and Anaerobic        FINAL       01/13/13 18:15  01/13/13   No growth after 5 days of incubation.      POCT glucose (AC and HS) [[id number]]  (Abnormal) Collected:01/13/13 1638    Specimen Information:Blood Updated:01/13/13 1707     POCT Glucose WB 185 (A) mg/dL     POCT glucose (AC and HS) [[id number]]  (Abnormal) Collected:01/13/13 1140    Specimen Information:Blood Updated:01/13/13 1214     POCT Glucose WB 197 (A) mg/dL             Lab 08/65/78 0741 01/13/13 2129 01/13/13 1638 01/13/13 1140 01/13/13 0715 01/12/13 2047 01/12/13 1630 01/12/13 1221 01/12/13 0753 01/11/13 2142   GLUCOSEWHOLE 120* 145* 185* 197* 83 136* 123* 122* 94 160*           Rads:     Radiology Results (24 Hour)     Procedure Component Value Units Date/Time    CT Head WO Contrast [[id number]] Collected:01/13/13 1545    Order Status:Completed  Updated:01/13/13 1551    Narrative:    CT HEAD WO CONTRAST  CLINICAL INDICATION: CVA    COMPARISON: 01/08/2013    TECHNIQUE: Imaging was performed from the skull base to the  vertex  without contrast administration.    FINDINGS:     Mild generalized parenchymal volume loss is present. The  ventricles  are normal in size and contour with respect to the patient's  age. Focal and patchy confluent lucencies are noted in the subcortical  and periventricular white matter bilaterally.     No hemorrhage, mass  effect or other acute change is noted.          Impression:     Stable exam. No hemorrhage or acute abnormality. Small  vessel white matter changes.    Heron Nay, MD   01/13/2013 3:47 PM          Assessment and Plan:     Generalized weakness likely due to acute febrile illness ? Viral vs bacterial and has improved with IVF and antibiotics, iv discontinued was covered with zosyn for poss aspiration pna ,  on po ceftin now now , clinda and augmentin had interaction with methotraxate   Leukocytosis worse , UA neg cx , no definite pna , poss sec to arthritic flare up/. Will repeat CXR , ID input   Dehydration , worsening renal fx , gentle iv fluids , d/c naproxen   Constipation resolved   Vomiting , resolved   Mild abdominal tenderness ? Get CT abdomen   Acute confusional state resolved to poss baseline , repeat CT also neg for acute change   DM   Rheumatoid Arthritis  .   PLAN:  Pt eval  Continue po abx ,  Gentle IVF , continue therapy, discharge planning to SNF likely, in am id=f stable  .     Signed by: Dorathy Daft, MD

## 2013-01-14 NOTE — Progress Notes (Signed)
Patient alert,follow commands.due morning medicine given to patient,tolerated well.no c/o nausea and vomiting.Has bowel movement today.appetite good.BUN and creatinine high,IV fluids start start per MD order.Incontinent care given to patient.turned and repositioned.denies pain.Appetite good,blood sugar maintained.family at bedside.contact isolation precautions.continue with pt's care plan.

## 2013-01-15 ENCOUNTER — Inpatient Hospital Stay: Payer: Medicare Other

## 2013-01-15 LAB — CBC WITH MANUAL DIFFERENTIAL
Band Neutrophils Absolute: 0.2 (ref 0.00–1.00)
Band Neutrophils: 2 %
Basophils Absolute Manual: 0 (ref 0.00–0.20)
Basophils Manual: 0 %
Cell Morphology: NORMAL
Eosinophils Absolute Manual: 0 (ref 0.00–0.70)
Eosinophils Manual: 0 %
Hematocrit: 29.8 % — ABNORMAL LOW (ref 37.0–47.0)
Hgb: 9.6 g/dL — ABNORMAL LOW (ref 12.0–16.0)
Lymphocytes Absolute Manual: 0.5 (ref 0.50–4.40)
Lymphocytes Manual: 5 %
MCH: 30 pg (ref 28.0–32.0)
MCHC: 32.2 g/dL (ref 32.0–36.0)
MCV: 93.1 fL (ref 80.0–100.0)
MPV: 10.8 fL (ref 9.4–12.3)
Monocytes Absolute: 0.4 (ref 0.00–1.20)
Monocytes Manual: 4 %
Neutrophils Absolute Manual: 8.96 — ABNORMAL HIGH (ref 1.80–8.10)
Nucleated RBC: 0 (ref 0–1)
Platelets: 275 (ref 140–400)
RBC: 3.2 — ABNORMAL LOW (ref 4.20–5.40)
RDW: 14 % (ref 12–15)
Segmented Neutrophils: 89 %
WBC: 10.07 (ref 3.50–10.80)

## 2013-01-15 LAB — COMPREHENSIVE METABOLIC PANEL
ALT: 12 U/L (ref 0–55)
AST (SGOT): 14 U/L (ref 5–34)
Albumin/Globulin Ratio: 0.5 — ABNORMAL LOW (ref 0.9–2.2)
Albumin: 2.1 g/dL — ABNORMAL LOW (ref 3.5–5.0)
Alkaline Phosphatase: 171 U/L — ABNORMAL HIGH (ref 40–150)
BUN: 27 mg/dL — ABNORMAL HIGH (ref 7–21)
Bilirubin, Total: 0.2 mg/dL (ref 0.2–1.2)
CO2: 23 (ref 22–29)
Calcium: 8.1 mg/dL (ref 7.9–10.6)
Chloride: 105 (ref 98–107)
Creatinine: 0.8 mg/dL (ref 0.6–1.0)
Globulin: 3.9 g/dL — ABNORMAL HIGH (ref 2.0–3.6)
Glucose: 195 mg/dL — ABNORMAL HIGH (ref 70–100)
Potassium: 4.1 (ref 3.5–5.1)
Protein, Total: 6 g/dL (ref 6.0–8.3)
Sodium: 136 (ref 136–145)

## 2013-01-15 LAB — POCT GLUCOSE
Whole Blood Glucose POCT: 126 mg/dL — AB (ref 70–100)
Whole Blood Glucose POCT: 106 mg/dL — AB (ref 70–100)
Whole Blood Glucose POCT: 115 mg/dL — AB (ref 70–100)
Whole Blood Glucose POCT: 134 mg/dL — AB (ref 70–100)

## 2013-01-15 LAB — STOOL OCCULT BLOOD: Stool Occult Blood: POSITIVE — AB

## 2013-01-15 LAB — GFR: EGFR: >60

## 2013-01-15 MED ORDER — ACETAMINOPHEN 325 MG PO TABS
325.0000 mg | ORAL_TABLET | Freq: Three times a day (TID) | ORAL | Status: DC
Start: 2013-01-15 — End: 2013-01-16
  Administered 2013-01-15 – 2013-01-16 (×3): 325 mg via ORAL
  Filled 2013-01-15 (×3): qty 1

## 2013-01-15 MED ORDER — NYSTATIN 100000 UNIT/ML MT SUSP
400000.0000 [IU] | Freq: Four times a day (QID) | OROMUCOSAL | Status: DC
Start: 2013-01-15 — End: 2013-01-16
  Administered 2013-01-15 – 2013-01-16 (×5): 400000 [IU] via ORAL
  Filled 2013-01-15 (×5): qty 5

## 2013-01-15 MED ORDER — BARIUM SULFATE 2.1 % PO SUSP
450.0000 mL | ORAL | Status: AC | PRN
Start: 2013-01-15 — End: 2013-01-15
  Administered 2013-01-15 (×2): 450 mL via ORAL
  Filled 2013-01-15 (×2): qty 450

## 2013-01-15 NOTE — Consults (Signed)
GI CONSULTATION    Date Time: 01/15/2013 10:44 PM  Patient Name: [name], [name]  Requesting Physician: Dorathy Daft, MD       Reason for Consultation:   GIB/ abd pain    History:   [name] is a 79 y.o. female who presents to the hospital on 01/07/2013 with Generalized weakness, episode of fever/n/v constipation/abd pain/leukocytosis, w/u showed neg CT scan of abd and pelvis [ except for L2 vertebra ?old compression fx], has a hx of RA. Now no fever n or v, tolerating diet though po intake is poor, wbc is better, no abd pain, had few BM's last one was blood tinged  loose stool.    Past Medical History:     Past Medical History   Diagnosis Date   . Diabetes mellitus without complication    . Hypertensive disorder    . Arthritis      rheumatoid       Past Surgical History:     Past Surgical History   Procedure Date   . Joint replacement 2005     BILATERAL THR   . Fracture surgery    . Eye surgery    . Hysterectomy 1980'S   . Block, epidural steroid / lumb-sac 12/18/2012     Procedure: BLOCK, EPIDURAL STEROID / LUMB-SAC;  Surgeon: Caro Laroche, MD;  Location: [facility];  Service: Anesthesiology;  Laterality: N/A;   . Block, epidural steroid / lumb-sac 01/01/2013     Procedure: BLOCK, EPIDURAL STEROID / LUMB-SAC;  Surgeon: Caro Laroche, MD;  Location: [facility];  Service: Anesthesiology;  Laterality: N/A;       Family History:   History reviewed. No pertinent family history.    Social History:     History     Social History   . Marital Status: Widowed     Spouse Name: N/A     Number of Children: N/A   . Years of Education: N/A     Social History Main Topics   . Smoking status: Never Smoker    . Smokeless tobacco: Not on file   . Alcohol Use: No   . Drug Use: No   . Sexually Active:      Other Topics Concern   . Not on file     Social History Narrative   . No narrative on file       Allergies:     Allergies   Allergen Reactions   . Azithromycin    . Dilaudid (Hydromorphone Hcl)    . Erythromycin    . Iodine      Iv  contrast       Medications:     Current Facility-Administered Medications   Medication Dose Route Frequency   . acetaminophen  325 mg Oral Q8H [facility]   . amLODIPine  5 mg Oral Daily   . aspirin  81 mg Oral Daily   . fluconazole  200 mg Intravenous Q24H   . gabapentin  400 mg Oral BID   . insulin glargine  10 Units Subcutaneous QHS   . lisinopril  2.5 mg Oral Daily   . nystatin  400,000 Units Oral QID   . pantoprazole  40 mg Oral QAM AC   . piperacillin-tazobactam  2.25 g Intravenous Q6H   . predniSONE  5 mg Oral QAM W/BREAKFAST   . senna-docusate  1 tablet Oral BID   . sucralfate  1 g Oral Daily   . vitamin B-12  500 mcg Oral Daily   . [  DISCONTINUED] heparin (porcine)  5,000 Units Subcutaneous Q12H [facility]       Review of Systems:   Constitutional: Fever, weight loss.  Integument:No skin rashes or lesions.  Respiratory:No shortness of breath or cough.  Gastrointestinal:See HPI.  Musculoskeletal: Joint pain.      Physical Exam:     Filed Vitals:    01/15/13 2025   BP: 177/86   Pulse: 74   Temp: 98 F (36.7 C)   Resp: 18   SpO2: 98%       Intake and Output Summary (Last 24 hours) at Date Time  No intake or output data in the 24 hours ending 01/15/13 2244    General appearance - Well developed and poorly nourished.   HEENT- Normocephalic. Eomi. Pale sclera anicteric. Edentulous.  Abdomen - normal bowel sounds.  No focal tenderness to palpation. No hepatosplenomegaly.  No masses. No ascites.        Labs Reviewed:     Lab 01/15/13 0457 01/14/13 0937 01/12/13 0504   WBC 10.07 14.02* 8.07   HGB 9.6* 10.4* 10.2*   HCT 29.8* 33.2* 31.7*   PLT 275 287 235         Lab 01/15/13 0457 01/14/13 0937 01/12/13 0504 01/11/13 0554   NA 136 137 137 --   K 4.1 4.7 3.7 --   CL 105 99 100 --   CO2 23 26 29  --   BUN 27* 33* 15 --   CREAT 0.8 1.3* 0.7 --   CA 8.1 8.5 7.9 --   ALB 2.1* -- 2.2* 2.2*   PROT 6.0 -- 6.0 6.0   BILITOTAL 0.2 -- 0.3 0.3   ALKPHOS 171* -- 180* 209*   ALT 12 -- 21 28   AST 14 -- 17 23   GLU 195* 202* 99 --   AMY -- -- --  --   LIP -- -- -- --       No results found for this basename: IRON:3,TIBC:3 in the last 168 hours      Rads:   GI Radiological Procedures since admission  reviewed.   Radiology Results (24 Hour)     Procedure Component Value Units Date/Time    CT Abdomen Pelvis WO IV/ W PO Cont [[id number]] Collected:01/15/13 1625    Order Status:Completed  Updated:01/15/13 1636    Narrative:    CT ABDOMEN AND PELVIS WITHOUT CONTRAST    CLINICAL STATEMENT: Fever. Increased white blood cell count.       COMPARISON: The study is compared to a prior performed on 03/19/2012.    TECHNIQUE: Helical axial imaging from above the domes of diaphragm  through the pubic symphysis was performed without the administration of  intravenous contrast and with oral contrast. Sagittal and coronal  reconstructions were obtained.     FINDINGS: Dependent changes are noted at the lung bases. Atherosclerotic  calcification is noted of the coronary arteries. There is prominent  calcification of the aortic valve leaflets. The heart is enlarged.    The study is performed without intravenous contrast therefore evaluation  of the solid parenchymal organs and vascular structures is limited.     Limited evaluation of the unenhanced liver, spleen, adrenal glands,  pancreas and kidneys are unremarkable. There is no hydronephrosis.    The large and small bowel are normal in caliber. Extensive diverticular  disease is noted of the sigmoid colon. The appendix cannot be  visualized.    No abdominal or pelvic lymphadenopathy or ascites is seen.  Extensive atherosclerotic calcification is noted of the abdominal aorta  and its branches. An inferior vena cava filter is present with the  infrarenal IVC.    An anterior wedge compression fracture of the L2 vertebral body  primarily affecting the superior endplate is new since 03/19/2012. There  is 50% height loss anteriorly of this fracture.    The patients bones are osteopenic. Height loss of the L5 vertebral body  is  unchanged. There is a mild thoracolumbar scoliotic curvature.  Endplate and facet joint degenerative changes are noted throughout the  lumbar spine. There is a grade 1 anterolisthesis of L4 upon L5. The  patient is status post bilateral hip arthroplasty.      Impression:      1. No intra-abdominal collection suggestive of abscess formation.  2. Anterior wedge compression fracture of the L2 vertebral body which is  of indeterminate although appears acute. Please correlate with the  patients history and site of pain.    Fonnie Mu, MD   01/15/2013 4:32 PM            Assessment:   GIB/Anemia? CRC? Colitis? Ischemic vs Infectious.  Wt Loss/Poor po intake? Etiology?  Elevated ALP due to RA/bone dx.    Plan:   Considering Pt's age and the overall condition I am hesitant to do colonoscopy at this time, and since her GI sx are better,will observe.  Will monitor PO intake c calori count, and consider supplementary nutrition and or PEG if requested.  Will f/u. Thanks.    Signed by: Shanon Brow, MD

## 2013-01-15 NOTE — Progress Notes (Signed)
INFECTIOUS DISEASES PROGRESS NOTE    Date Time: 01/15/2013 11:02 PM  Patient Name: [facility] M      Assessment and Recommendations:     Patient Active Problem List   Diagnosis   . Type II or unspecified type diabetes mellitus with neurological manifestations, not stated as uncontrolled(250.60)   . Atherosclerosis of native arteries of the extremities, unspecified   . Type II or unspecified type diabetes mellitus with neurological manifestations, not stated as uncontrolled(250.60)   . Chronic venous insufficiency   . CHF (congestive heart failure)   . HTN (hypertension)   . History of DVT of lower extremity   . GERD (gastroesophageal reflux disease)   . RA (rheumatoid arthritis)   . Varicose veins of lower extremities with ulcer   . Anemia   . Diverticulitis   . AKI (acute kidney injury)   . CKD (chronic kidney disease), stage III   . Chest pain   . General weakness     The patient is a [name] with above complex hx admitted on 01/07/2013 with FTT, malaise, myalgias/arthralgia/ dec po intake, weight loss..     Pt does have a hx of c diff, with colitis seen on ct   In past.  Noted to have obstipation and guaic + stools.  Source of fever and leukocytosis unclear.   ?gi source/intrabdominal source/?colitis    Understand that full w/u would be difficult to  Pursue in this pt.    Would continue empiric zosyn and diflucan for now    Subjective:   APPRECIATE GI NOTE     Antibiotics:   Diflucan and Zosyn    DAY 3     Medications:     Current Facility-Administered Medications   Medication Dose Route Frequency   . acetaminophen  325 mg Oral Q8H SCH   . amLODIPine  5 mg Oral Daily   . aspirin  81 mg Oral Daily   . fluconazole  200 mg Intravenous Q24H   . gabapentin  400 mg Oral BID   . insulin glargine  10 Units Subcutaneous QHS   . lisinopril  2.5 mg Oral Daily   . nystatin  400,000 Units Oral QID   . pantoprazole  40 mg Oral QAM AC   . piperacillin-tazobactam  2.25 g Intravenous Q6H   . predniSONE  5 mg Oral QAM  W/BREAKFAST   . senna-docusate  1 tablet Oral BID   . sucralfate  1 g Oral Daily   . vitamin B-12  500 mcg Oral Daily   . [DISCONTINUED] heparin (porcine)  5,000 Units Subcutaneous Q12H [facility]       Central Access/Endovascular Hardware:     Active PICC Line / CVC Line / PIV Line / Drain / Airway / Intraosseous Line / Epidural Line / ART Line / Line / Wound / Pressure Ulcer / NG/OG Tube     Name   Placement date   Placement time   Site   Days    Peripheral IV 01/10/13 Right Forearm  01/10/13   1130   Forearm   5    Wound (Miscellaneous) 01/07/13 Other (Comment) Buttocks Left;Upper;Inner stage 2  01/07/13   2045   Buttocks   8          Review of Systems:   Patient is without fevers, chills, nausea, vomiting, diarrhea, abdominal pain, cough, dyspnea, headache, rash.      Physical Exam:     Filed Vitals:    01/15/13 0640 01/15/13  1000 01/15/13 1341 01/15/13 2025   BP: 146/76 160/75 148/72 177/86   Pulse: 79  76 74   Temp: 97.5 F (36.4 C)  98.4 F (36.9 C) 98 F (36.7 C)   TempSrc: Axillary   Axillary   Resp: 18  22 18    Height:       Weight:       SpO2: 99%  99% 98%       Temp (24hrs), Avg:98 F (36.7 C), Min:97.5 F (36.4 C), Max:98.4 F (36.9 C)      Thin, chronically ill-appearing  In no apparent distress  Sclera anicteric, conjunctivae clear  Oral pharynx s erythema, exudate, or other significant lesions  No Thrush  Neck supple, s lymphadenopathy  Lungs Clear  Heart Regular rate and rhythm s murmurs, rubs, or gallops  Abdomen Soft, non tender, non distended, normal bowel sounds  Back No Costovertebral angle tenderness, no point tenderness along the spine  Extremities No Cyanosis, no clubbing, +arthirtic deformities Palpable pedal pulses  Neurologic exam awake, alert, pleasantly confused  Skin without rash     Labs:     Results     Procedure Component Value Units Date/Time    POCT glucose (AC and HS) [[id number]]  (Abnormal) Collected:01/15/13 1640    Specimen Information:Blood Updated:01/15/13 1705     POCT  Glucose WB 134 (A) mg/dL     Stool Occult Blood [[id number]]  (Abnormal) Collected:01/15/13 1453    Specimen Information:Stool Updated:01/15/13 1520     Stool Occult Blood Positive (A)     POCT glucose (AC and HS) [[id number]]  (Abnormal) Collected:01/15/13 1150    Specimen Information:Blood Updated:01/15/13 1213     POCT Glucose WB 115 (A) mg/dL     POCT glucose (AC and HS) [[id number]]  (Abnormal) Collected:01/15/13 1150     POCT Glucose WB 106 (A) mg/dL [id number]:56/43/32 9518    POCT glucose (AC and HS) [[id number]]  (Abnormal) Collected:01/15/13 0800     POCT Glucose WB 126 (A) mg/dL ZSWFUXN:23/55/73 2202    CBC WITH MANUAL DIFFERENTIAL [[id number]]  (Abnormal) Collected:01/15/13 0457     WBC 10.07 Updated:01/15/13 0700     RBC 3.20 (L)      Hgb 9.6 (L) g/dL      Hematocrit 62.8 (L) %      MCV 93.1 fL      MCH 30.0 pg      MCHC 32.2 g/dL      RDW 14 %      Platelets 275      MPV 10.8 fL      Segmented Neutrophils 89 %      Band Neutrophils 2 %      Lymphocytes Manual 5 %      Monocytes Manual 4 %      Eosinophils Manual 0 %      Basophils Manual 0 %      Nucleated RBC 0      Abs Seg Manual 8.96 (H)      Bands Absolute 0.20      Absolute Lymph Manual 0.50      Monocytes Absolute 0.40      Absolute Eos Manual 0.00      Absolute Baso Manual 0.00      Cell Morphology: Normal     Comprehensive metabolic panel [[id number]]  (Abnormal) Collected:01/15/13 0457    Specimen Information:Blood Updated:01/15/13 0606     Glucose 195 (H) mg/dL      BUN 27 (H) mg/dL  Creatinine 0.8 mg/dL      Sodium 409      Potassium 4.1      Chloride 105      CO2 23      CALCIUM 8.1 mg/dL      Protein, Total 6.0 g/dL      Albumin 2.1 (L) g/dL      AST (SGOT) 14 U/L      ALT 12 U/L      Alkaline Phosphatase 171 (H) U/L      Bilirubin, Total 0.2 mg/dL      Globulin 3.9 (H) g/dL      Albumin/Globulin Ratio 0.5 (L)     GFR [[id number]] Collected:01/15/13 0457     EGFR >60.0 Updated:01/15/13 0606          Lab 01/15/13 0457   WBC 10.07   HGB 9.6*   HCT  29.8*   PLT 275        Lab 01/15/13 0457   NA 136   K 4.1   CL 105   CO2 23   BUN 27*   CREAT 0.8   CA 8.1   ALB 2.1*   PROT 6.0   BILITOTAL 0.2   ALKPHOS 171*   ALT 12   AST 14   GLU 195*     No results found for this basename: INR,APTT in the last 168 hours       Rads:     Radiology Results (24 Hour)     Procedure Component Value Units Date/Time    CT Abdomen Pelvis WO IV/ W PO Cont [[id number]] Collected:01/15/13 1625    Order Status:Completed  Updated:01/15/13 1636    Narrative:    CT ABDOMEN AND PELVIS WITHOUT CONTRAST    CLINICAL STATEMENT: Fever. Increased white blood cell count.       COMPARISON: The study is compared to a prior performed on 03/19/2012.    TECHNIQUE: Helical axial imaging from above the domes of diaphragm  through the pubic symphysis was performed without the administration of  intravenous contrast and with oral contrast. Sagittal and coronal  reconstructions were obtained.     FINDINGS: Dependent changes are noted at the lung bases. Atherosclerotic  calcification is noted of the coronary arteries. There is prominent  calcification of the aortic valve leaflets. The heart is enlarged.    The study is performed without intravenous contrast therefore evaluation  of the solid parenchymal organs and vascular structures is limited.     Limited evaluation of the unenhanced liver, spleen, adrenal glands,  pancreas and kidneys are unremarkable. There is no hydronephrosis.    The large and small bowel are normal in caliber. Extensive diverticular  disease is noted of the sigmoid colon. The appendix cannot be  visualized.    No abdominal or pelvic lymphadenopathy or ascites is seen.     Extensive atherosclerotic calcification is noted of the abdominal aorta  and its branches. An inferior vena cava filter is present with the  infrarenal IVC.    An anterior wedge compression fracture of the L2 vertebral body  primarily affecting the superior endplate is new since 03/19/2012. There  is 50% height loss  anteriorly of this fracture.    The patients bones are osteopenic. Height loss of the L5 vertebral body  is unchanged. There is a mild thoracolumbar scoliotic curvature.  Endplate and facet joint degenerative changes are noted throughout the  lumbar spine. There is a grade 1 anterolisthesis of L4 upon L5. The  patient  is status post bilateral hip arthroplasty.      Impression:      1. No intra-abdominal collection suggestive of abscess formation.  2. Anterior wedge compression fracture of the L2 vertebral body which is  of indeterminate although appears acute. Please correlate with the  patients history and site of pain.    Fonnie Mu, MD   01/15/2013 4:32 PM          Signed by: Ray Church, MD

## 2013-01-15 NOTE — Progress Notes (Signed)
Pt continue to have no appetite no emesis today but continue to have slight tenderness abdominal,pt with loose stool x2 this shift small to moderate amount  Bloody Dr. Lorel Monaco aware , pt abdominal CT scan completed

## 2013-01-15 NOTE — Progress Notes (Signed)
[facility]IC[facility] PROGRESS NOTE    Date Time: 01/15/2013 1:02 PM  Patient Name: [name]        Subjective:   Feeling  weak , no  Vomiting,  , going for CT abdomen , tolerated po contrast ,kept it down   Had BM , looked Blood tinged     Medications:   Scheduled Meds:  Current Facility-Administered Medications   Medication Dose Route Frequency   . acetaminophen  325 mg Oral Q8H SCH   . amLODIPine  5 mg Oral Daily   . aspirin  81 mg Oral Daily   . fluconazole  200 mg Intravenous Q24H   . gabapentin  400 mg Oral BID   . heparin (porcine)  5,000 Units Subcutaneous Q12H [facility]   . insulin glargine  10 Units Subcutaneous QHS   . lisinopril  2.5 mg Oral Daily   . pantoprazole  40 mg Oral QAM AC   . piperacillin-tazobactam  2.25 g Intravenous Q6H   . predniSONE  5 mg Oral QAM W/BREAKFAST   . senna-docusate  1 tablet Oral BID   . sucralfate  1 g Oral Daily   . vitamin B-12  500 mcg Oral Daily   . [DISCONTINUED] cefuroxime  250 mg Oral Q12H SCH   . [DISCONTINUED] naproxen  500 mg Oral BID Meals     Continuous Infusions:       . [EXPIRED] sodium chloride 75 mL/hr at 01/14/13 1231     PRN Meds:.acetaminophen, barium sulfate, dextrose, dextrose, glucagon (rDNA), insulin aspart, insulin aspart, ondansetron, oxyCODONE-acetaminophen, Ultram + Acetaminophen 50-325 combo dose      Review of Systems:   A comprehensive review of systems was: History obtained from chart review and the patient  General ROS: weak, no fever, no chills  Respiratory ROS: no cough, shortness of breath, or wheezing  Cardiovascular ROS: no chest pain   Gastrointestinal ROS: no abdominal pain,     Physical Exam:     Filed Vitals:    01/14/13 1400 01/14/13 2026 01/15/13 0640 01/15/13 1000   BP: 117/55 117/50 146/76 160/75   Pulse: 80 70 79    Temp: 98.2 F (36.8 C) 96.8 F (36 C) 97.5 F (36.4 C)    TempSrc:  Axillary Axillary    Resp: 24 20 18     Height:       Weight:       SpO2: 100% 99% 99%        General appearance - alert, well appearing,  and in no distress, frail elderly female  Mental status - alert, oriented to person, place, and time  Chest - clear to auscultation, no wheezes, rales or rhonchi, symmetric air entry  Heart - normal rate, regular rhythm, normal S1, S2, no murmurs, rubs, clicks or gallops  Abdomen - bowel sounds normal, Abdomen is soft, sl  Less tender diffuse  , nondistended  Neuro unchanged   Labs:     Results     Procedure Component Value Units Date/Time    POCT glucose (AC and HS) [[id number]]  (Abnormal) Collected:01/15/13 1150    Specimen Information:Blood Updated:01/15/13 1213     POCT Glucose WB 115 (A) mg/dL     POCT glucose (AC and HS) [[id number]]  (Abnormal) Collected:01/15/13 1150     POCT Glucose WB 106 (A) mg/dL NWGNFAO:13/08/65 7846    POCT glucose (AC and HS) [[id number]]  (Abnormal) Collected:01/15/13 0800     POCT Glucose WB 126 (A) mg/dL LKGMWNU:27/25/36 6440  CBC WITH MANUAL DIFFERENTIAL [[id number]]  (Abnormal) Collected:01/15/13 0457     WBC 10.07 Updated:01/15/13 0700     RBC 3.20 (L)      Hgb 9.6 (L) g/dL      Hematocrit 73.7 (L) %      MCV 93.1 fL      MCH 30.0 pg      MCHC 32.2 g/dL      RDW 14 %      Platelets 275      MPV 10.8 fL      Segmented Neutrophils 89 %      Band Neutrophils 2 %      Lymphocytes Manual 5 %      Monocytes Manual 4 %      Eosinophils Manual 0 %      Basophils Manual 0 %      Nucleated RBC 0      Abs Seg Manual 8.96 (H)      Bands Absolute 0.20      Absolute Lymph Manual 0.50      Monocytes Absolute 0.40      Absolute Eos Manual 0.00      Absolute Baso Manual 0.00      Cell Morphology: Normal     Comprehensive metabolic panel [[id number]]  (Abnormal) Collected:01/15/13 0457    Specimen Information:Blood Updated:01/15/13 0606     Glucose 195 (H) mg/dL      BUN 27 (H) mg/dL      Creatinine 0.8 mg/dL      Sodium 462      Potassium 4.1      Chloride 105      CO2 23      CALCIUM 8.1 mg/dL      Protein, Total 6.0 g/dL      Albumin 2.1 (L) g/dL      AST (SGOT) 14 U/L      ALT 12 U/L       Alkaline Phosphatase 171 (H) U/L      Bilirubin, Total 0.2 mg/dL      Globulin 3.9 (H) g/dL      Albumin/Globulin Ratio 0.5 (L)     GFR [[id number]] Collected:01/15/13 0457     EGFR >60.0 Updated:01/15/13 0606    POCT glucose (AC and HS) [[id number]]  (Abnormal) Collected:01/14/13 2127     POCT Glucose WB 241 (A) mg/dL RCVELFY:02/13/50 0258    POCT glucose (AC and HS) [[id number]]  (Abnormal) Collected:01/14/13 1653     POCT Glucose WB 199 (A) mg/dL NTIRWER:15/40/08 6761            Lab 01/15/13 1150 01/15/13 0800 01/14/13 2127 01/14/13 1653 01/14/13 1145 01/14/13 0741 01/13/13 2129 01/13/13 1638 01/13/13 1140 01/13/13 0715   GLUCOSEWHOLE 950*932* 126* 241* 199* 241* 120* 145* 185* 197* 83           Rads:     Radiology Results (24 Hour)     ** No Results found for the last 24 hours. **          Assessment and Plan:     Generalized weakness likely due to acute febrile illness ? Viral vs bacterial and has improved with IVF and antibiotics,initially was covered with zosyn for poss aspiration pna , , clinda and augmentin not given , had interaction with methotraxate   Leukocytosis improved  , UA neg cx , no definite pna , poss abdominal source , ID input appreciated , await Ct abdomen   AKi / Dehydration ,  resolved with  gentle  iv fluids , d/c naproxen   Constipation resolved   Vomiting , resolved   Mild abdominal tenderness , await  CT abdomen   Acute confusional state resolved to poss baseline? , repeat CT also neg for acute change , no acute stroke   DM   Rheumatoid Arthritis  .   PLAN:  Pt eval  Continue iv  abx per ID , , po fluids , nut eval , , continue therapy, follow on Ct abdomen discharge planning to SNF when stable  .     Signed by: Dorathy Daft, MD    Addendum hemoccult pos , h/h mild drop ,   relatively stable , pt also got iv fluids overnight , poss infectious /ischemic vs other causes , follow on Ct abdomen, monitor h/h , Gi consulted    Hold heparin ,

## 2013-01-15 NOTE — Progress Notes (Signed)
Palliative Care Progress Note - [facility]    Date Time: 01/15/2013 12:35 PM  Patient Name: [name]  Attending Physician: Dorathy Daft, MD    Assessment/Plan:   Pain -from arthrittis, ? Contracture. I will start with Tylenol ATC q 8 hours. If it does not help, I will consider tramadol 25 mg bid.    Weakness-no evidence of CVA    Altered mental status-? Metabolic encephalopathy, resolved now    AFTT-25 lb weight loss since Nov. 2013    DM, HTN, RA    Advance Care Plan -limited DNR as per Dr. Brunilda Payor' discussion with the patient    Disposition -[facility]    Chief Complaint:   Pain in the left elbow    Interval History:   As above, it prevents her from doing therapy.  "My elbow has been like this for a long time".   "I cannot straighten it completely".  Tylenol helps sometimes but at times does not help.    Past Medical History:     Past Medical History   Diagnosis Date   . Diabetes mellitus without complication    . Hypertensive disorder    . Arthritis      rheumatoid       Social History:   Code status: DNAR with Support     Allergies:     Allergies   Allergen Reactions   . Azithromycin    . Dilaudid (Hydromorphone Hcl)    . Erythromycin    . Iodine      Iv contrast       Medications:   Medication list/MAR reviewed.    Review of Systems:   Negative other than described above.    Physical Exam:   VS:   Filed Vitals:    01/15/13 1000   BP: 160/75   Pulse:    Temp:    Resp:    SpO2:      GENERAL: Appears in no obvious distress.  EYES: Sclera anicteric. Conjunctivae pink.  ENT: Oral mucosa moist.  NECK: Trachea midline. Neck veins flat. No adenopathy.  HEART: RRR. Normal S1, S2. No murmur appreciated.  CHEST: Breath sounds are clear bilaterally.  ABDOMEN: Soft. Non-tender. Non-distended. BS+.  GU: Deferred.  RECTAL: Deferred.  EXTREMITIES: No edema. No clubbing. No cyanosis. Pain upon extending left forearm  SKIN: No rash or lesion.  PSYCH: Alert. Oriented x 3. Appropriate.  NEURO: Moves all extremities. No  focal weakness.      Labs/Diagnostics:   Reviewed.  Hb-9.6, BUN-27, albumin-2.1, alk. phosph-171, globulin-3.9  Wt Readings from Last 5 Encounters:   01/07/13 54.432 kg (120 lb)   12/18/12 58.06 kg (128 lb)   12/18/12 58.06 kg (128 lb)   07/14/12 58.469 kg (128 lb 14.4 oz)   05/31/12 62.869 kg (138 lb 9.6 oz)       Time in: 12:30/Time out: 12:45. More than 50% of this visit was spent in face to face consultation and discussion of diagnosis, prognosis, and treatment options.  Signed by: Park Breed, MD  [facility]  [phone]

## 2013-01-15 NOTE — Plan of Care (Signed)
Patient appears to be oriented.  She is lethargic, very sleepy, easily aroused, but falls back to sleep easily.  IV Abx were changed -- administered per new orders.

## 2013-01-16 LAB — CBC WITH MANUAL DIFFERENTIAL
Band Neutrophils Absolute: 0.15 (ref 0.00–1.00)
Band Neutrophils: 2 %
Basophils Absolute Manual: 0 (ref 0.00–0.20)
Basophils Manual: 0 %
Cell Morphology: NORMAL
Eosinophils Absolute Manual: 0.08 (ref 0.00–0.70)
Eosinophils Manual: 1 %
Hematocrit: 30.4 % — ABNORMAL LOW (ref 37.0–47.0)
Hgb: 9.6 g/dL — ABNORMAL LOW (ref 12.0–16.0)
Lymphocytes Absolute Manual: 0.53 (ref 0.50–4.40)
Lymphocytes Manual: 7 %
MCH: 29.4 pg (ref 28.0–32.0)
MCHC: 31.6 g/dL — ABNORMAL LOW (ref 32.0–36.0)
MCV: 93.3 fL (ref 80.0–100.0)
MPV: 10.9 fL (ref 9.4–12.3)
Monocytes Absolute: 0.3 (ref 0.00–1.20)
Monocytes Manual: 4 %
Neutrophils Absolute Manual: 6.54 (ref 1.80–8.10)
Nucleated RBC: 0 (ref 0–1)
Platelets: 298 (ref 140–400)
RBC: 3.26 — ABNORMAL LOW (ref 4.20–5.40)
RDW: 14 % (ref 12–15)
Segmented Neutrophils: 86 %
WBC: 7.61 (ref 3.50–10.80)

## 2013-01-16 LAB — BASIC METABOLIC PANEL
BUN: 12 mg/dL (ref 7–21)
CO2: 26 (ref 22–29)
Calcium: 8.5 mg/dL (ref 7.9–10.6)
Chloride: 105 (ref 98–107)
Creatinine: 0.7 mg/dL (ref 0.6–1.0)
Glucose: 109 mg/dL — ABNORMAL HIGH (ref 70–100)
Potassium: 4.3 (ref 3.5–5.1)
Sodium: 138 (ref 136–145)

## 2013-01-16 LAB — POCT GLUCOSE
Whole Blood Glucose POCT: 112 mg/dL — AB (ref 70–100)
Whole Blood Glucose POCT: 181 mg/dL — AB (ref 70–100)
Whole Blood Glucose POCT: 192 mg/dL — AB (ref 70–100)

## 2013-01-16 LAB — GFR: EGFR: >60

## 2013-01-16 MED ORDER — CEFDINIR 300 MG PO CAPS
300.0000 mg | ORAL_CAPSULE | Freq: Two times a day (BID) | ORAL | Status: DC
Start: 2013-01-16 — End: 2013-01-16
  Filled 2013-01-16: qty 1

## 2013-01-16 MED ORDER — FLUCONAZOLE 100 MG PO TABS
200.0000 mg | ORAL_TABLET | ORAL | Status: DC
Start: 2013-01-16 — End: 2013-01-16
  Administered 2013-01-16: 200 mg via ORAL
  Filled 2013-01-16: qty 2

## 2013-01-16 MED ORDER — CEFDINIR 300 MG PO CAPS
300.0000 mg | ORAL_CAPSULE | Freq: Two times a day (BID) | ORAL | Status: AC
Start: 2013-01-16 — End: 2013-01-26

## 2013-01-16 MED ORDER — TRAMADOL-ACETAMINOPHEN 37.5-325 MG PO TABS
1.0000 | ORAL_TABLET | Freq: Four times a day (QID) | ORAL | Status: AC | PRN
Start: 2013-01-16 — End: 2013-01-26

## 2013-01-16 MED ORDER — INSULIN ASPART 100 UNIT/ML SC SOLN
1.0000 [IU] | Freq: Three times a day (TID) | SUBCUTANEOUS | Status: AC
Start: 2013-01-16 — End: ?

## 2013-01-16 MED ORDER — FLUCONAZOLE 200 MG PO TABS
200.0000 mg | ORAL_TABLET | Freq: Every day | ORAL | Status: AC
Start: 2013-01-16 — End: 2013-01-26

## 2013-01-16 MED ORDER — METRONIDAZOLE 250 MG PO TABS
500.0000 mg | ORAL_TABLET | Freq: Two times a day (BID) | ORAL | Status: DC
Start: 2013-01-16 — End: 2013-01-16

## 2013-01-16 MED ORDER — METRONIDAZOLE 500 MG PO TABS
500.0000 mg | ORAL_TABLET | Freq: Two times a day (BID) | ORAL | Status: AC
Start: 2013-01-16 — End: 2013-01-26

## 2013-01-16 MED ORDER — NYSTATIN 100000 UNIT/ML MT SUSP
400000.0000 [IU] | Freq: Four times a day (QID) | OROMUCOSAL | Status: AC
Start: 2013-01-16 — End: ?

## 2013-01-16 NOTE — Progress Notes (Signed)
Nutrition Follow-up    Assessment    Active Hospital Problems    Diagnosis   . General weakness       Orders Placed This Encounter   Procedures   . Diet dysphagia PUREED Liquid consistency:: Thin       Current Meds:       acetaminophen 325 mg Q8H SCH   amLODIPine 5 mg Daily   cefdinir 300 mg Q12H   fluconazole 200 mg Q24H SCH   gabapentin 400 mg BID   insulin glargine 10 Units QHS   lisinopril 2.5 mg Daily   metroNIDAZOLE 500 mg Q12H   nystatin 400,000 Units QID   pantoprazole 40 mg QAM AC   predniSONE 5 mg QAM W/BREAKFAST   senna-docusate 1 tablet BID   sucralfate 1 g Daily   vitamin B-12 500 mcg Daily   [DISCONTINUED] aspirin 81 mg Daily   [DISCONTINUED] fluconazole 200 mg Q24H   [DISCONTINUED] heparin (porcine) 5,000 Units Q12H [facility]   [DISCONTINUED] piperacillin-tazobactam 2.25 g Q6H            No intake or output data in the 24 hours ending 01/16/13 1450    Recent Labs:      Lab 01/16/13 0544 01/15/13 0457 01/14/13 0937   WBC 7.61 10.07 14.02*   HGB 9.6* 9.6* 10.4*   HCT 30.4* 29.8* 33.2*   MCV 93.3 93.1 94.1   PLT 298 275 287         Lab 01/16/13 0544 01/15/13 0457 01/14/13 0937 01/12/13 0504 01/11/13 0554   NA 138 136 137 137 135*   K 4.3 4.1 4.7 3.7 3.4*   CL 105 105 99 100 99   CO2 26 23 26 29 26    BUN 12 27* 33* 15 12   CREAT 0.7 0.8 1.3* 0.7 0.7   GLU 109* 195* 202* 99 132*   CA 8.5 8.1 8.5 7.9 7.8*   MG -- -- -- -- --   PHOS -- -- -- -- --     9/18: Alb: 2.1 (indicative of severe depletion)    Anthropometrics  Height: 175.3 cm (5' 9.02")  Weight: 54.432 kg (120 lb)  Weight Change: 0   IBW/kg (Calculated) Female: 72.79 kg  IBW/kg (Calculated) Female: 65.93 kg  BMI (calculated): 17.7   % IBW: 80%; Pt states UBW about 145#; Last time this wt in 2013 (per pt).    Estimated Energy Needs  Total Energy Estimated Needs: 1400-1700 cal  Method for Estimating Needs: IBW x ( 25-30 ) cal          Estimated Protein Needs  Total Protein Estimated Needs: 57-68 g  Method for Estimating Needs: IBW x ( 1-1.2 ) g      Per  physician note (9/18), noted pt with poor PO intake; recent episode of GI bleed, multiple BM, diarrhea. Pt without c/o same upon visit for nutrition consult this afternoon.          Fluid Needs  Total Fluid Estimated Needs: 1425 ml  Method for Estimating Needs: IBW x 25 ml           Learning Needs: None at this time; Encouraged pt to use dentures to eat at all meals while in hospital and at home.     Pt and pt family report dentures are currently with pt in room;     Writer noted per SLT evaluation entered 9/12, pt dentures appeared to be incorrectly sized. Discussed this issue with pt and pt family  tonight; Both parties denied dentures as ill-fitting; Both parties reported pt requires encouragement and at times assistance inserting dentures correctly; Pt states she was wearing dentures at home pta when eating meals; Denies following a therapeutic diet at home. Family reports pt completed mashed potatoes/gravy brought in from [facility] yesterday without observed difficulty.    Writer noted pt-reported likes/dislikes, applied to current diet order as appropriate.     Pt requesting Ensure or Boost with all meals.       Nutrition Diagnosis:   Intake Intake : Ni-2.1 Inadequate Oral Intake   Related to decreased ability to consume sufficient energy as evidenced by poor po intake and difficulty chewing in absence of dentures.   Nc-3.1 Underweight   Related to increased energy needs as evidenced by BMI <18, poor po intake, noted stage 2 skin impairment to buttocks.    Intervention: Rec. 1. Provide Ensure Plus (Strawberry) TID to increase PO intake to better meet estimated needs.   2. Consider order prealbumin to more accurately assess pt current level of malnutrition r/t underweight, Alb. 2.1 (indicative of severe depletion).   3. Encourage PO intake of diet and oral supplement with proper use of dentures at all meals.   4. SLT consider re-evaluate with dentures in place; Consider advance to mechanical soft, consistent  carbohydrate diet as tolerated and as appropriate based on assessment.   5. Will provide Mighty shake with all meals in lieu of physician order for Ensure Plus as appropriate.     There is no recommendation for PEG placement at this time d/t pt age, current medical sequale, and reports from pt and family.       Goals: Increase PO intake to at least 50-75% completion of all meals and oral supplements (as ordered per RD and physician discretion).     Advance current diet as recommended by SLT and as tolerated by pt.       M/E: Will continue to closely monitor pt PO intake and labs; Will follow-up within 4 days.      Thank you for the nutrition consult.       Harrell Gave, MS, RD, LDN

## 2013-01-16 NOTE — Progress Notes (Signed)
Palliative Care Progress Note - [facility]    Date Time: 01/16/2013 11:31 AM  Patient Name: [name]  Attending Physician: Dorathy Daft, MD    Assessment/Plan:   Pain -better controlled, on ATC Tylenol    Weakness-no evidence of CVA     Altered mental status-? Metabolic encephalopathy, resolved now     AFTT-25 lb weight loss since Nov. 2013     Episode of blood in the stool-I reviewed Dr. Nita Sickle note. I agree that patient is not a candidate for colonoscopy due to her age, comorbidities and being on long term prednisone.    DM, HTN, RA     Advance Care Plan -limited DNR as per Dr. Brunilda Payor' discussion with the patient     Disposition -most likely SNF at [facility] [place] as per case manager    Chief Complaint:   none    Interval History:   Pain is better controlled  She is in recliner reading this morning    Past Medical History:     Past Medical History   Diagnosis Date   . Diabetes mellitus without complication    . Hypertensive disorder    . Arthritis      rheumatoid       Social History:   Code status: DNAR with Support     Allergies:     Allergies   Allergen Reactions   . Azithromycin    . Dilaudid (Hydromorphone Hcl)    . Erythromycin    . Iodine      Iv contrast       Medications:   Medication list/MAR reviewed.    Review of Systems:   Negative other than described above.    Physical Exam:   VS:   Filed Vitals:    01/16/13 0805   BP: 177/92   Pulse: 62   Temp:    Resp:    SpO2:      GENERAL: Appears in no obvious distress.  EYES:  Sclera anicteric. Conjunctivae pink.  ENT: Oral mucosa moist.  NECK: Trachea midline. Neck veins flat. No adenopathy.  HEART: RRR. Normal S1, S2. No murmur appreciated.  CHEST: Breath sounds are clear bilaterally.  ABDOMEN: Soft. Non-tender. Non-distended. BS+.  GU: Deferred.  RECTAL: Deferred.  EXTREMITIES: No edema. No clubbing. No cyanosis. Left elbow contracture from RA.  SKIN: No rash or lesion.  PSYCH: Alert. Oriented x 3. Appropriate.  NEURO: Moves all  extremities. No focal weakness.      Labs/Diagnostics:   Reviewed.  Hb-9.6  Wt Readings from Last 5 Encounters:   01/07/13 54.432 kg (120 lb)   12/18/12 58.06 kg (128 lb)   12/18/12 58.06 kg (128 lb)   07/14/12 58.469 kg (128 lb 14.4 oz)   05/31/12 62.869 kg (138 lb 9.6 oz)       Time in: 11:22/Time out: 11:36. More than 50% of this visit was spent in face to face consultation and discussion of diagnosis, prognosis, and treatment options.  Signed by: Park Breed, MD  [facility]  [phone]

## 2013-01-16 NOTE — Progress Notes (Incomplete)
Ms. Goodreau will transition to [facility] this evening. Discharge orders faxed.  PTS wheelchair Zenaida Niece will transport at 6:30pm.

## 2013-01-16 NOTE — Progress Notes (Signed)
Patient discharge to [facility].all discharge instructions given to patient niece and verbalized understand the discharge instructions.IV line discontinued and patient left the floor with all her belongings.

## 2013-01-16 NOTE — Progress Notes (Signed)
Patient alert,follow commands.due morning medicine given to patient,tolerated well.out of bed to chair.tylenol po given to patient for generalized pain management.contact isolation precautions,blood sugar maintained.discharge planning today.

## 2013-01-16 NOTE — Discharge Summary (Signed)
PROGRESS NOTE/DISCHARGE SUMMARY      Today's Date Time: 01/16/2013 3:07 PM    Admission Date and Time: 01/07/2013 12:45 PM  Patient Name: [name] M  Attending Physician: Dorathy Daft, MD  Primary Care Physician: Shelbie Proctor, MD  LOS: 9    Date of Admission:   01/07/2013    Date of Discharge:   01/16/2013      Discharge Dx:   Admitted with Generalized weakness , malaise , dec po intake   Acute confusional state  to metabolic encephalopathy, resolved now  repeat CT also neg for acute change , no acute stroke ,   acute febrile illness , treated empirically for colitis   Leukocytosis resolved , UA neg cx , no definite pna , likely  Sec to abdominal source ,  Ct abdomen without contrast was neg for acute abd pathology . Continue to finish course for poss colitis as per ID   AKi / Dehydration , resolved with gentle iv fluids , d/c naproxen /also sec to  issue of pos hemoccult stool  Constipation resolved   Vomiting , resolved   Episode of blood in the stool- reviewed Dr. Nita Sickle note.(GI )  agree  patient is not a candidate for colonoscopy due to her age, comorbidities and being on long term prednisone.   PT MAY HAVE COLON TUMOR, DIVERTICULITIS ?ISCHEMIC COLITIS THAT CANNOT BE APPRECIATED ON NONCONTRAST SCAN.   Mild abdominal tenderness ,improved   +HX C DIFF AND "COLITIS" ON CT IN PAST  HTN .   DM  Glucotrol held till appetite improved  ,  Was on 15 of lantus currently on 10 , increase as needed , cover with novolog ssi   Rheumatoid Arthritis   .FTT  25 lb weight loss since Nov. 2013     PLAN:   Pt eval and treat , nut eval ,aspiration precaution , bowel management   Continue po abx per ID ,  po fluids , follow on the CBC and BMP weekly and resume the baby aspirin if H/H stable 2-3 weeks  . Monitor for c.diff sxs   D/c to snf       Patient Active Problem List    Diagnosis Date Noted   . General weakness 01/07/2013   . Chest pain 07/14/2012   . Anemia 03/22/2012   . Diverticulitis 03/22/2012   . AKI  (acute kidney injury) 03/22/2012   . CKD (chronic kidney disease), stage III 03/22/2012   . Type II or unspecified type diabetes mellitus with neurological manifestations, not stated as uncontrolled(250.60) 03/03/2012   . Atherosclerosis of native arteries of the extremities, unspecified 03/03/2012   . Type II or unspecified type diabetes mellitus with neurological manifestations, not stated as uncontrolled(250.60) 03/03/2012   . Chronic venous insufficiency 03/03/2012   . CHF (congestive heart failure) 03/03/2012   . HTN (hypertension) 03/03/2012   . History of DVT of lower extremity 03/03/2012   . GERD (gastroesophageal reflux disease) 03/03/2012   . RA (rheumatoid arthritis) 03/03/2012   . Varicose veins of lower extremities with ulcer 03/03/2012        Past Medical History:     Past Medical History   Diagnosis Date   . Diabetes mellitus without complication    . Hypertensive disorder    . Arthritis      rheumatoid       Past Surgical history:     Past Surgical History   Procedure Date   . Joint replacement 2005  BILATERAL THR   . Fracture surgery    . Eye surgery    . Hysterectomy 1980'S   . Block, epidural steroid / lumb-sac 12/18/2012     Procedure: BLOCK, EPIDURAL STEROID / LUMB-SAC;  Surgeon: Caro Laroche, MD;  Location: [facility];  Service: Anesthesiology;  Laterality: N/A;   . Block, epidural steroid / lumb-sac 01/01/2013     Procedure: BLOCK, EPIDURAL STEROID / LUMB-SAC;  Surgeon: Caro Laroche, MD;  Location: [facility];  Service: Anesthesiology;  Laterality: N/A;       Consultations:   Treatment Team: Attending Provider: Dorathy Daft, MD; Physical Therapist: Prince Solian, PT; Consulting Physician: Park Breed, MD; Consulting Physician: Pryor Montes, MD; Consulting Physician: Ray Church, MD; Consulting Physician: Shanon Brow, MD; Registered Nurse: Aris Everts, RN; Technician: Mardene Speak     Procedures/Radiology performed:     Results     Procedure Component Value  Units Date/Time    POCT glucose [facility] and HS) [[id number]]  (Abnormal) Collected:01/16/13 1130    Specimen Information:Blood Updated:01/16/13 1209     POCT Glucose WB 181 (A) mg/dL     POCT glucose (AC and HS) [[id number]]  (Abnormal) Collected:01/16/13 0805     POCT Glucose WB 112 (A) mg/dL DGLOVFI:43/32/95 1884    CBC WITH MANUAL DIFFERENTIAL [[id number]]  (Abnormal) Collected:01/16/13 0544     WBC 7.61 Updated:01/16/13 0631     RBC 3.26 (L)      Hgb 9.6 (L) g/dL      Hematocrit 01.0 (L) %      MCV 93.3 fL      MCH 29.4 pg      MCHC 31.6 (L) g/dL      RDW 14 %      Platelets 298      MPV 10.9 fL      Segmented Neutrophils 86 %      Band Neutrophils 2 %      Lymphocytes Manual 7 %      Monocytes Manual 4 %      Eosinophils Manual 1 %      Basophils Manual 0 %      Nucleated RBC 0      Abs Seg Manual 6.54      Bands Absolute 0.15      Absolute Lymph Manual 0.53      Monocytes Absolute 0.30      Absolute Eos Manual 0.08      Absolute Baso Manual 0.00      Cell Morphology: Normal     Basic Metabolic Panel [[id number]]  (Abnormal) Collected:01/16/13 0544    Specimen Information:Blood Updated:01/16/13 0623     Glucose 109 (H) mg/dL      BUN 12 mg/dL      Creatinine 0.7 mg/dL      CALCIUM 8.5 mg/dL      Sodium 202      Potassium 4.3      Chloride 105      CO2 26     GFR [[id number]] Collected:01/16/13 0544     EGFR >60.0 Updated:01/16/13 0623    POCT glucose (AC and HS) [[id number]]  (Abnormal) Collected:01/15/13 1640    Specimen Information:Blood Updated:01/15/13 1705     POCT Glucose WB 134 (A) mg/dL     Stool Occult Blood [[id number]]  (Abnormal) Collected:01/15/13 1453    Specimen Information:Stool Updated:01/15/13 1520     Stool Occult Blood Positive (A)     POCT glucose (AC and HS) [[id number]]  (Abnormal) Collected:01/15/13  1150    Specimen Information:Blood Updated:01/15/13 1213     POCT Glucose WB 115 (A) mg/dL     POCT glucose (AC and HS) [[id number]]  (Abnormal) Collected:01/15/13 1150     POCT Glucose WB 106 (A) mg/dL  WUJWJXB:14/78/29 5621    POCT glucose (AC and HS) [[id number]]  (Abnormal) Collected:01/15/13 0800     POCT Glucose WB 126 (A) mg/dL NGEXBMW:41/32/44 0102    CBC WITH MANUAL DIFFERENTIAL [[id number]]  (Abnormal) Collected:01/15/13 0457     WBC 10.07 Updated:01/15/13 0700     RBC 3.20 (L)      Hgb 9.6 (L) g/dL      Hematocrit 34.7 (L) %      MCV 93.1 fL      MCH 30.0 pg      MCHC 32.2 g/dL      RDW 14 %      Platelets 275      MPV 10.8 fL      Segmented Neutrophils 89 %      Band Neutrophils 2 %      Lymphocytes Manual 5 %      Monocytes Manual 4 %      Eosinophils Manual 0 %      Basophils Manual 0 %      Nucleated RBC 0      Abs Seg Manual 8.96 (H)      Bands Absolute 0.20      Absolute Lymph Manual 0.50      Monocytes Absolute 0.40      Absolute Eos Manual 0.00      Absolute Baso Manual 0.00      Cell Morphology: Normal     Comprehensive metabolic panel [[id number]]  (Abnormal) Collected:01/15/13 0457    Specimen Information:Blood Updated:01/15/13 0606     Glucose 195 (H) mg/dL      BUN 27 (H) mg/dL      Creatinine 0.8 mg/dL      Sodium 564      Potassium 4.1      Chloride 105      CO2 23      CALCIUM 8.1 mg/dL      Protein, Total 6.0 g/dL      Albumin 2.1 (L) g/dL      AST (SGOT) 14 U/L      ALT 12 U/L      Alkaline Phosphatase 171 (H) U/L      Bilirubin, Total 0.2 mg/dL      Globulin 3.9 (H) g/dL      Albumin/Globulin Ratio 0.5 (L)     GFR [[id number]] Collected:01/15/13 0457     EGFR >60.0 Updated:01/15/13 0606    POCT glucose (AC and HS) [[id number]]  (Abnormal) Collected:01/14/13 2127     POCT Glucose WB 241 (A) mg/dL WFUXNAT:55/73/22 0254    POCT glucose (AC and HS) [[id number]]  (Abnormal) Collected:01/14/13 1653     POCT Glucose WB 199 (A) mg/dL GBTDVVO:16/07/37 1062    POCT glucose (AC and HS) [[id number]]  (Abnormal) Collected:01/14/13 1145     POCT Glucose WB 241 (A) mg/dL OJJKKXF:81/82/99 3716    Basic Metabolic Panel [[id number]]  (Abnormal) Collected:01/14/13 0937    Specimen Information:Blood  Updated:01/14/13 1002     Glucose 202 (H) mg/dL      BUN 33 (H) mg/dL      Creatinine 1.3 (H) mg/dL      CALCIUM 8.5 mg/dL      Sodium 175      Potassium 4.7  Chloride 99      CO2 26     GFR [[id number]] Collected:01/14/13 0937     EGFR 46.1 Updated:01/14/13 1002    CBC without differential [[id number]]  (Abnormal) Collected:01/14/13 0937    Specimen Information:Blood / Blood Updated:01/14/13 0946     WBC 14.02 (H)      RBC 3.53 (L)      Hgb 10.4 (L) g/dL      Hematocrit 78.2 (L) %      MCV 94.1 fL      MCH 29.5 pg      MCHC 31.3 (L) g/dL      RDW 15 %      Platelets 287      MPV 10.6 fL      Nucleated RBC 0     POCT glucose (AC and HS) [[id number]]  (Abnormal) Collected:01/14/13 0741     POCT Glucose WB 120 (A) mg/dL VHQIONG:29/52/84 1324    POCT glucose (AC and HS) [[id number]]  (Abnormal) Collected:01/13/13 2129     POCT Glucose WB 145 (A) mg/dL GUYQIHK:74/25/95 6387    CULTURE BLOOD AEROBIC AND ANAEROBIC [[id number]] Collected:01/08/13 1154    Specimen Information:Blood, Venipuncture Updated:01/13/13 1815    Narrative:    ORDER#: [id number]                                    ORDERED BY: Claudette Head  SOURCE: Blood, Venipuncture peripheral               COLLECTED:  01/08/13 11:54  ANTIBIOTICS AT COLL.:                                RECEIVED :  01/08/13 15:29  Culture Blood Aerobic and Anaerobic        FINAL       01/13/13 18:15  01/13/13   No growth after 5 days of incubation.      POCT glucose (AC and HS) [[id number]]  (Abnormal) Collected:01/13/13 1638    Specimen Information:Blood Updated:01/13/13 1707     POCT Glucose WB 185 (A) mg/dL     POCT glucose (AC and HS) [[id number]]  (Abnormal) Collected:01/13/13 1140    Specimen Information:Blood Updated:01/13/13 1214     POCT Glucose WB 197 (A) mg/dL     POCT glucose (AC and HS) [[id number]] Collected:01/13/13 0715    Specimen Information:Blood Updated:01/13/13 0758     POCT Glucose WB 83 mg/dL     POCT glucose (AC and HS) [[id number]]  (Abnormal) Collected:01/12/13  2047     POCT Glucose WB 136 (A) mg/dL HYWVPXT:10/23/92 8546    POCT glucose (AC and HS) [[id number]]  (Abnormal) Collected:01/12/13 1630     POCT Glucose WB 123 (A) mg/dL GHWEXHB:71/69/67 8938        Radiology Results (24 Hour)     Procedure Component Value Units Date/Time    CT Abdomen Pelvis WO IV/ W PO Cont [[id number]] Collected:01/15/13 1625    Order Status:Completed  Updated:01/15/13 1636    Narrative:    CT ABDOMEN AND PELVIS WITHOUT CONTRAST    CLINICAL STATEMENT: Fever. Increased white blood cell count.       COMPARISON: The study is compared to a prior performed on 03/19/2012.    TECHNIQUE: Helical axial imaging from above the domes of diaphragm  through the pubic symphysis was performed without the administration of  intravenous  contrast and with oral contrast. Sagittal and coronal  reconstructions were obtained.     FINDINGS: Dependent changes are noted at the lung bases. Atherosclerotic  calcification is noted of the coronary arteries. There is prominent  calcification of the aortic valve leaflets. The heart is enlarged.    The study is performed without intravenous contrast therefore evaluation  of the solid parenchymal organs and vascular structures is limited.     Limited evaluation of the unenhanced liver, spleen, adrenal glands,  pancreas and kidneys are unremarkable. There is no hydronephrosis.    The large and small bowel are normal in caliber. Extensive diverticular  disease is noted of the sigmoid colon. The appendix cannot be  visualized.    No abdominal or pelvic lymphadenopathy or ascites is seen.     Extensive atherosclerotic calcification is noted of the abdominal aorta  and its branches. An inferior vena cava filter is present with the  infrarenal IVC.    An anterior wedge compression fracture of the L2 vertebral body  primarily affecting the superior endplate is new since 03/19/2012. There  is 50% height loss anteriorly of this fracture.    The patients bones are osteopenic. Height loss  of the L5 vertebral body  is unchanged. There is a mild thoracolumbar scoliotic curvature.  Endplate and facet joint degenerative changes are noted throughout the  lumbar spine. There is a grade 1 anterolisthesis of L4 upon L5. The  patient is status post bilateral hip arthroplasty.      Impression:      1. No intra-abdominal collection suggestive of abscess formation.  2. Anterior wedge compression fracture of the L2 vertebral body which is  of indeterminate although appears acute. Please correlate with the  patients history and site of pain.    Fonnie Mu, MD   01/15/2013 4:32 PM              Hospital Course:   [name] is a 79 y.o. female with DM, RA maintained On prednisone and mtx   Who presented to [facility] ER on 01/07/13 with c/o Generalized weakness,myalgias and arthralgias, decreased appetite and failure To  thrive.   On presentation she was afebrile with temp 98.7, bp 14/68,   Pulse 73 and 02 sat 99%.   She was found to have retained food in the oral pharynx   ekg was unremarkable Wbc 8.52 hgb 11.7 c 74 p, 15 l, 10 monos. Glucose 222 bun/creat 13/0.8 Na 131   Ua was unremarkable   Recently, on 9/4, pt apparently had Been treated with an l4-l5 epidural block.   On the day post admit, 9/11 there was concern that the   Pt had had a facial droop, left arm weakness, and unclear speech, and increased confusion.. She had spike a temp to 101.1 9 am, and spiked again at 3pm to 101.2   Zosyn tx was initiated on 9/11 as empiric tx for possible   Aspiration   On 9/12 bp incrased to 210/83 tmax of 100.6 9/12 pm   On 9/12, pt performed well on swallowing evaluation -   With thin liquids and puree. Noted to have ill-fitting   Dentures.   On 9/13 am had temp 100.5 and has been afebrile since   That time.   On 9/15, noted to have constipation - no bm x 4 days.   Senna- s ordered Had N/V x 2   Hx wt loss note since 2/14 - 62.8 kg down to 54.4  lbs.   On 9/16 . ?more lethargic   Vomited after eating 80% breakfast .    Finally had bm in evening - and subsequently a couple   More since then    FOUND TO HAVE OBSTIPATION, GUAIC +   STOOL. +HX C DIFF AND "COLITIS" ON CT IN PAST   PT MAY HAVE COLON TUMOR, DIVERTICULITIS   ?ISCHEMIC COLITIS THAT CANNOT BE APPRECIATED ON   NONCONTRAST SCAN.    CONCUR WITH GI THAT COLONOSCOPY/FURTHER W/U   WOULD BE RISKY IN THIS PT.   THINK IT BEST TO TREAT EMPIRICALLY WITH   COURSE OF ABX.    [place] ZOSYN,   CHANGE TO PO OMNICEF 300 PO BID + FLAGYL 500 BID   + DIFLUCAN  X 10 days   Pt feeling good today , tol diet , no  abd sxs .   Resume d/c to snf     DISCHARGE DAY EXAM:  Patient Vitals for the past 24 hrs:   BP Temp Temp src Pulse Resp SpO2   01/16/13 1300 174/84 mmHg 97.3 F (36.3 C) Oral 78  18  100 %   01/16/13 0805 177/92 mmHg - - 62  - -   01/16/13 0526 160/76 mmHg 97.7 F (36.5 C) - 62  20  99 %   01/15/13 2025 177/86 mmHg 98 F (36.7 C) Axillary 74  18  98 %     Body mass index is 17.71 kg/(m^2).    Discharge Medications:        Medication List       As of 01/16/2013  3:07 PM      START taking these medications           cefdinir 300 MG capsule    Commonly known as: OMNICEF    Take 1 capsule (300 mg total) by mouth every 12 (twelve) hours.        fluconazole 200 MG tablet    Commonly known as: DIFLUCAN    Take 1 tablet (200 mg total) by mouth daily.        metroNIDAZOLE 500 MG tablet    Commonly known as: FLAGYL    Take 1 tablet (500 mg total) by mouth every 12 (twelve) hours.        nystatin 100000 UNIT/ML suspension    Commonly known as: MYCOSTATIN    Take 4 mLs (400,000 Units total) by mouth 4 (four) times daily.        senna-docusate 8.6-50 MG per tablet    Commonly known as: PERICOLACE    Take 1 tablet by mouth daily.         CHANGE how you take these medications           insulin aspart 100 UNIT/ML injection    Commonly known as: NovoLOG    Inject 1-3 Units into the skin 3 (three) times daily before meals.    What changed:  - dose  - doctor's instructions         CONTINUE taking these  medications           amLODIPine 5 MG tablet    Commonly known as: NORVASC        calcium-vitamin D 500-200 MG-UNIT per tablet    Commonly known as: OSCAL-500        furosemide 20 MG tablet    Commonly known as: LASIX        gabapentin 600 MG tablet    Commonly known as: NEURONTIN  insulin glargine 100 UNIT/ML injection    Commonly known as: LANTUS        lisinopril 2.5 MG tablet    Commonly known as: PRINIVIL,ZESTRIL    Take 1 tablet (2.5 mg total) by mouth daily.        magnesium oxide 400 MG tablet    Commonly known as: MAG-OX        methotrexate 2.5 MG tablet    Commonly known as: RHEUMATREX        pantoprazole 40 MG tablet    Commonly known as: PROTONIX        predniSONE 5 MG tablet    Commonly known as: DELTASONE        sucralfate 1 G tablet    Commonly known as: CARAFATE        traMADol-acetaminophen 37.5-325 MG per tablet    Commonly known as: ULTRACET    Take 1 tablet by mouth every 6 (six) hours as needed.        vitamin B-12 500 MCG tablet    Commonly known as: CYANOCOBALAMIN         STOP taking these medications           acetaminophen 500 MG tablet    Commonly known as: TYLENOL        aspirin 81 MG tablet        cetirizine 10 MG tablet    Commonly known as: ZyrTEC        glipiZIDE 5 MG tablet    Commonly known as: GLUCOTROL        naproxen 500 MG tablet    Commonly known as: NAPROSYN        oxyCODONE-acetaminophen 5-325 MG per tablet    Commonly known as: PERCOCET               Where to get your medications     These are the prescriptions that you need to pick up.    You may get these medications from any pharmacy.           cefdinir 300 MG capsule    fluconazole 200 MG tablet    metroNIDAZOLE 500 MG tablet    nystatin 100000 UNIT/ML suspension    traMADol-acetaminophen 37.5-325 MG per tablet             Information on where to get these meds is not yet available. Ask your nurse or doctor.           insulin aspart 100 UNIT/ML injection    senna-docusate 8.6-50 MG per tablet                    Discharge Instructions:       Total time    Minutes spent coordinating discharge and reviewing discharge plan:More than 30 minutes      Signed by: Dorathy Daft, MD    CC: Shelbie Proctor, MD

## 2013-01-16 NOTE — Progress Notes (Signed)
INFECTIOUS DISEASES PROGRESS NOTE    Date Time: 01/16/2013 2:11 PM  Patient Name: [name] M      Assessment and Recommendations:     Patient Active Problem List   Diagnosis   . Type II or unspecified type diabetes mellitus with neurological manifestations, not stated as uncontrolled(250.60)   . Atherosclerosis of native arteries of the extremities, unspecified   . Type II or unspecified type diabetes mellitus with neurological manifestations, not stated as uncontrolled(250.60)   . Chronic venous insufficiency   . CHF (congestive heart failure)   . HTN (hypertension)   . History of DVT of lower extremity   . GERD (gastroesophageal reflux disease)   . RA (rheumatoid arthritis)   . Varicose veins of lower extremities with ulcer   . Anemia   . Diverticulitis   . AKI (acute kidney injury)   . CKD (chronic kidney disease), stage III   . Chest pain   . General weakness   HX C DIFF IN PAST    ADMITTED ON 9/10 WITH FTT, MALAISE, DEC PO INTAKE  WT LOSS   FOUND TO HAVE OBSTIPATION, GUAIC +  STOOL.  +HX C DIFF AND "COLITIS" ON CT IN PAST    PT MAY HAVE COLON TUMOR, DIVERTICULITIS   ?ISCHEMIC COLITIS THAT CANNOT BE APPRECIATED ON  NONCONTRAST SCAN.     I CONCUR WITH GI THAT COLONOSCOPY/FURTHER W/U  WOULD BE RISKY IN THIS PT.    THINK IT BEST TO TREAT EMPIRICALLY WITH  COURSE OF ABX.    CAN [place] ZOSYN,   CHANGE TO PO OMNICEF 300 PO BID + FLAGYL 500 BID  + DIFLUCAN     Subjective:   APPRECIATE GI NOTE   IN BETTER SPIRITS  EATING SOME  REPORTS ABD PAIN RESOLVED  MOVING BOWELS BETTER     Antibiotics:   Diflucan and Zosyn------------>OMNICEF + FLAGYL +DIFLUC    DAY 3   THIS COMBO                                       X 10 DAYS     Medications:     Current Facility-Administered Medications   Medication Dose Route Frequency   . acetaminophen  325 mg Oral Q8H SCH   . amLODIPine  5 mg Oral Daily   . fluconazole  200 mg Intravenous Q24H   . gabapentin  400 mg Oral BID   . insulin glargine  10 Units Subcutaneous QHS   . lisinopril   2.5 mg Oral Daily   . nystatin  400,000 Units Oral QID   . pantoprazole  40 mg Oral QAM AC   . piperacillin-tazobactam  2.25 g Intravenous Q6H   . predniSONE  5 mg Oral QAM W/BREAKFAST   . senna-docusate  1 tablet Oral BID   . sucralfate  1 g Oral Daily   . vitamin B-12  500 mcg Oral Daily   . [DISCONTINUED] aspirin  81 mg Oral Daily   . [DISCONTINUED] heparin (porcine)  5,000 Units Subcutaneous Q12H [name]       Central Access/Endovascular Hardware:     Active PICC Line / CVC Line / PIV Line / Drain / Airway / Intraosseous Line / Epidural Line / ART Line / Line / Wound / Pressure Ulcer / NG/OG Tube     Name   Placement date   Placement time   Site   Days  Peripheral IV 01/10/13 Right Forearm  01/10/13   1130   Forearm   5    Wound (Miscellaneous) 01/07/13 Other (Comment) Buttocks Left;Upper;Inner stage 2  01/07/13   2045   Buttocks   8          Review of Systems:   Patient is without fevers, chills, nausea, vomiting, diarrhea, , cough, dyspnea, headache, rash.  ABDOMINAL PAIN RESOLVED   MOVING BOWELS  GUAIC +, BUT NO ACTIVE BLEEDING    Physical Exam:     Filed Vitals:    01/15/13 2025 01/16/13 0526 01/16/13 0805 01/16/13 1300   BP: 177/86 160/76 177/92 174/84   Pulse: 74 62 62 78   Temp: 98 F (36.7 C) 97.7 F (36.5 C)  97.3 F (36.3 C)   TempSrc: Axillary   Oral   Resp: 18 20  18    Height:       Weight:       SpO2: 98% 99%  100%       Temp (24hrs), Avg:97.7 F (36.5 C), Min:97.3 F (36.3 C), Max:98 F (36.7 C)        In no apparent distress  Sclera anicteric, conjunctivae clear  Oral pharynx s erythema, exudate, or other significant lesions   Thrush - RESOLVED  Neck supple, s lymphadenopathy  Lungs Clear DEC BS BASES   Heart Regular rate and rhythm 2/6 SEM  Abdomen Soft,  non distended, normal bowel sounds  MIN TENDERNESS LUQ AND LLQ  Back No Costovertebral angle tenderness, no point tenderness along the spine  Extremities No Cyanosis, no clubbing, or edema  Palpable pedal pulses  Neurologic exam AWAKE,  ALERT, ORIENTED   SPEAKING WITH VISITORS  Skin without rash   Labs:     Results     Procedure Component Value Units Date/Time    POCT glucose (AC and HS) [[id number]]  (Abnormal) Collected:01/16/13 1130    Specimen Information:Blood Updated:01/16/13 1209     POCT Glucose WB 181 (A) mg/dL     POCT glucose (AC and HS) [[id number]]  (Abnormal) Collected:01/16/13 0805     POCT Glucose WB 112 (A) mg/dL MWUXLKG:40/10/27 2536    CBC WITH MANUAL DIFFERENTIAL [[id number]]  (Abnormal) Collected:01/16/13 0544     WBC 7.61 Updated:01/16/13 0631     RBC 3.26 (L)      Hgb 9.6 (L) g/dL      Hematocrit 59.5 (L) %      MCV 93.3 fL      MCH 29.4 pg      MCHC 31.6 (L) g/dL      RDW 14 %      Platelets 298      MPV 10.9 fL      Segmented Neutrophils 86 %      Band Neutrophils 2 %      Lymphocytes Manual 7 %      Monocytes Manual 4 %      Eosinophils Manual 1 %      Basophils Manual 0 %      Nucleated RBC 0      Abs Seg Manual 6.54      Bands Absolute 0.15      Absolute Lymph Manual 0.53      Monocytes Absolute 0.30      Absolute Eos Manual 0.08      Absolute Baso Manual 0.00      Cell Morphology: Normal     Basic Metabolic Panel [[id number]]  (Abnormal) Collected:01/16/13 0544    Specimen Information:Blood Updated:01/16/13  5409     Glucose 109 (H) mg/dL      BUN 12 mg/dL      Creatinine 0.7 mg/dL      CALCIUM 8.5 mg/dL      Sodium 811      Potassium 4.3      Chloride 105      CO2 26     GFR [[id number]] Collected:01/16/13 0544     EGFR >60.0 Updated:01/16/13 0623    POCT glucose (AC and HS) [[id number]]  (Abnormal) Collected:01/15/13 1640    Specimen Information:Blood Updated:01/15/13 1705     POCT Glucose WB 134 (A) mg/dL     Stool Occult Blood [[id number]]  (Abnormal) Collected:01/15/13 1453    Specimen Information:Stool Updated:01/15/13 1520     Stool Occult Blood Positive (A)           Lab 01/16/13 0544   WBC 7.61   HGB 9.6*   HCT 30.4*   PLT 298        Lab 01/16/13 0544 01/15/13 0457   NA 138 --   K 4.3 --   CL 105 --   CO2 26 --   BUN  12 --   CREAT 0.7 --   CA 8.5 --   ALB -- 2.1*   PROT -- 6.0   BILITOTAL -- 0.2   ALKPHOS -- 171*   ALT -- 12   AST -- 14   GLU 109* --     No results found for this basename: INR,APTT in the last 168 hours       Rads:     Radiology Results (24 Hour)     Procedure Component Value Units Date/Time    CT Abdomen Pelvis WO IV/ W PO Cont [[id number]] Collected:01/15/13 1625    Order Status:Completed  Updated:01/15/13 1636    Narrative:    CT ABDOMEN AND PELVIS WITHOUT CONTRAST    CLINICAL STATEMENT: Fever. Increased white blood cell count.       COMPARISON: The study is compared to a prior performed on 03/19/2012.    TECHNIQUE: Helical axial imaging from above the domes of diaphragm  through the pubic symphysis was performed without the administration of  intravenous contrast and with oral contrast. Sagittal and coronal  reconstructions were obtained.     FINDINGS: Dependent changes are noted at the lung bases. Atherosclerotic  calcification is noted of the coronary arteries. There is prominent  calcification of the aortic valve leaflets. The heart is enlarged.    The study is performed without intravenous contrast therefore evaluation  of the solid parenchymal organs and vascular structures is limited.     Limited evaluation of the unenhanced liver, spleen, adrenal glands,  pancreas and kidneys are unremarkable. There is no hydronephrosis.    The large and small bowel are normal in caliber. Extensive diverticular  disease is noted of the sigmoid colon. The appendix cannot be  visualized.    No abdominal or pelvic lymphadenopathy or ascites is seen.     Extensive atherosclerotic calcification is noted of the abdominal aorta  and its branches. An inferior vena cava filter is present with the  infrarenal IVC.    An anterior wedge compression fracture of the L2 vertebral body  primarily affecting the superior endplate is new since 03/19/2012. There  is 50% height loss anteriorly of this fracture.    The patients bones are  osteopenic. Height loss of the L5 vertebral body  is unchanged. There is a mild thoracolumbar scoliotic curvature.  Endplate  and facet joint degenerative changes are noted throughout the  lumbar spine. There is a grade 1 anterolisthesis of L4 upon L5. The  patient is status post bilateral hip arthroplasty.      Impression:      1. No intra-abdominal collection suggestive of abscess formation.  2. Anterior wedge compression fracture of the L2 vertebral body which is  of indeterminate although appears acute. Please correlate with the  patients history and site of pain.    Fonnie Mu, MD   01/15/2013 4:32 PM          Signed by: Ray Church, MD

## 2013-01-16 NOTE — Progress Notes (Signed)
Alert and oriented. Complained of pain and discomfort on the legs.verbalized that the machine causing the leg to hurt, explained the importance of SCD, still requested to remove the SCD,becoming agitated with it. Removed SCD, resting calmly refused to take pain medication saying she is not in pain anymore. Call light in reach of the pt. IV antibiotic given as ordered. Continue to observed isolation precaution. With 1 brown,reddish soft stool noted. Dr. Aquilla Hacker GI specialist made aware.Continue to monitor for profuse rectal bleeding.

## 2013-02-02 ENCOUNTER — Inpatient Hospital Stay: Payer: Medicare Other | Admitting: Internal Medicine

## 2013-02-02 ENCOUNTER — Inpatient Hospital Stay
Admission: EM | Admit: 2013-02-02 | Discharge: 2013-02-05 | DRG: 690 | Disposition: A | Discharge status: Skilled Nursing Facility | Payer: Medicare Other | Attending: Internal Medicine | Admitting: Internal Medicine

## 2013-02-02 DIAGNOSIS — E119 Type 2 diabetes mellitus without complications: Secondary | ICD-10-CM | POA: Diagnosis present

## 2013-02-02 DIAGNOSIS — I10 Essential (primary) hypertension: Secondary | ICD-10-CM | POA: Diagnosis present

## 2013-02-02 DIAGNOSIS — R7989 Other specified abnormal findings of blood chemistry: Secondary | ICD-10-CM | POA: Diagnosis present

## 2013-02-02 DIAGNOSIS — N390 Urinary tract infection, site not specified: Principal | ICD-10-CM | POA: Diagnosis present

## 2013-02-02 DIAGNOSIS — Z96649 Presence of unspecified artificial hip joint: Secondary | ICD-10-CM

## 2013-02-02 DIAGNOSIS — R7401 Elevation of levels of liver transaminase levels: Secondary | ICD-10-CM | POA: Diagnosis present

## 2013-02-02 DIAGNOSIS — R7402 Elevation of levels of lactic acid dehydrogenase (LDH): Secondary | ICD-10-CM | POA: Diagnosis present

## 2013-02-02 DIAGNOSIS — M069 Rheumatoid arthritis, unspecified: Secondary | ICD-10-CM | POA: Diagnosis present

## 2013-02-02 DIAGNOSIS — R112 Nausea with vomiting, unspecified: Secondary | ICD-10-CM | POA: Diagnosis present

## 2013-02-02 DIAGNOSIS — E860 Dehydration: Secondary | ICD-10-CM | POA: Diagnosis present

## 2013-02-02 DIAGNOSIS — Z794 Long term (current) use of insulin: Secondary | ICD-10-CM

## 2013-02-02 HISTORY — DX: Gastro-esophageal reflux disease without esophagitis: K21.9

## 2013-02-02 LAB — COMPREHENSIVE METABOLIC PANEL
ALT: 136 U/L — ABNORMAL HIGH (ref 0–55)
AST (SGOT): 192 U/L — ABNORMAL HIGH (ref 5–34)
Albumin/Globulin Ratio: 0.5 — ABNORMAL LOW (ref 0.9–2.2)
Albumin: 2.4 g/dL — ABNORMAL LOW (ref 3.5–5.0)
Alkaline Phosphatase: 401 U/L — ABNORMAL HIGH (ref 40–150)
BUN: 27 mg/dL — ABNORMAL HIGH (ref 7–21)
Bilirubin, Total: 0.6 mg/dL (ref 0.2–1.2)
CO2: 22 (ref 22–29)
Calcium: 8.3 mg/dL (ref 7.9–10.6)
Chloride: 99 (ref 98–107)
Creatinine: 1.1 mg/dL — ABNORMAL HIGH (ref 0.6–1.0)
Globulin: 4.4 g/dL — ABNORMAL HIGH (ref 2.0–3.6)
Glucose: 113 mg/dL — ABNORMAL HIGH (ref 70–100)
Potassium: 4.7 (ref 3.5–5.1)
Protein, Total: 6.8 g/dL (ref 6.0–8.3)
Sodium: 131 — ABNORMAL LOW (ref 136–145)

## 2013-02-02 LAB — CBC WITH MANUAL DIFFERENTIAL
Band Neutrophils Absolute: 0.85 (ref 0.00–1.00)
Band Neutrophils: 5 %
Basophils Absolute Manual: 0 (ref 0.00–0.20)
Basophils Manual: 0 %
Cell Morphology: ABNORMAL — AB
Eosinophils Absolute Manual: 0 (ref 0.00–0.70)
Eosinophils Manual: 0 %
Hematocrit: 27.7 % — ABNORMAL LOW (ref 37.0–47.0)
Hgb: 9.1 g/dL — ABNORMAL LOW (ref 12.0–16.0)
Lymphocytes Absolute Manual: 0.51 (ref 0.50–4.40)
Lymphocytes Manual: 3 %
MCH: 30.4 pg (ref 28.0–32.0)
MCHC: 32.9 g/dL (ref 32.0–36.0)
MCV: 92.6 fL (ref 80.0–100.0)
MPV: 11 fL (ref 9.4–12.3)
Monocytes Absolute: 0.34 (ref 0.00–1.20)
Monocytes Manual: 2 %
Neutrophils Absolute Manual: 15.33 — ABNORMAL HIGH (ref 1.80–8.10)
Nucleated RBC: 0 (ref 0–1)
Platelets: 207 (ref 140–400)
RBC: 2.99 — ABNORMAL LOW (ref 4.20–5.40)
RDW: 15 % (ref 12–15)
Segmented Neutrophils: 90 %
WBC: 17.03 — ABNORMAL HIGH (ref 3.50–10.80)

## 2013-02-02 LAB — URINALYSIS, REFLEX TO MICROSCOPIC EXAM IF INDICATED
Bilirubin, UA: NEGATIVE
Glucose, UA: NEGATIVE
Ketones UA: NEGATIVE
Nitrite, UA: NEGATIVE
Protein, UR: 30 — AB
Specific Gravity UA: 1.014 (ref 1.001–1.035)
Urine pH: 6 (ref 5.0–8.0)
Urobilinogen, UA: NEGATIVE mg/dL

## 2013-02-02 LAB — CK: Creatine Kinase (CK): 136 U/L (ref 29–168)

## 2013-02-02 LAB — GLUCOSE WHOLE BLOOD - POCT: Whole Blood Glucose POCT: 177 mg/dL — ABNORMAL HIGH (ref 70–100)

## 2013-02-02 LAB — TROPONIN I: Troponin I: 0.04 ng/mL (ref 0.00–0.09)

## 2013-02-02 LAB — MAGNESIUM: Magnesium: 1.8 mg/dL (ref 1.6–2.6)

## 2013-02-02 LAB — LACTIC ACID, PLASMA: Lactic Acid: 1.4 mmol/L (ref 0.5–2.2)

## 2013-02-02 LAB — B-TYPE NATRIURETIC PEPTIDE: B-Natriuretic Peptide: 152 pg/mL — ABNORMAL HIGH (ref 0–100)

## 2013-02-02 LAB — GFR: EGFR: 55.8

## 2013-02-02 MED ORDER — SODIUM CHLORIDE 0.9 % IV SOLN
INTRAVENOUS | Status: AC
Start: 2013-02-02 — End: 2013-02-02
  Administered 2013-02-02: 1000 mL/h via INTRAVENOUS

## 2013-02-02 MED ORDER — ONDANSETRON HCL 4 MG/2ML IJ SOLN
4.0000 mg | Freq: Four times a day (QID) | INTRAMUSCULAR | Status: AC | PRN
Start: 2013-02-02 — End: 2013-02-03
  Administered 2013-02-02 – 2013-02-03 (×2): 4 mg via INTRAVENOUS
  Filled 2013-02-02 (×2): qty 2

## 2013-02-02 MED ORDER — SODIUM CHLORIDE 0.9 % IV SOLN
INTRAVENOUS | Status: DC | PRN
Start: 2013-02-02 — End: 2013-02-03

## 2013-02-02 MED ORDER — ACETAMINOPHEN 325 MG PO TABS
650.0000 mg | ORAL_TABLET | Freq: Four times a day (QID) | ORAL | Status: AC | PRN
Start: 2013-02-02 — End: 2013-02-03
  Administered 2013-02-03: 650 mg via ORAL
  Filled 2013-02-02: qty 2

## 2013-02-02 MED ORDER — SODIUM CHLORIDE 0.9 % IV SOLN
INTRAVENOUS | Status: DC
Start: 2013-02-02 — End: 2013-02-02

## 2013-02-02 MED ORDER — SODIUM CHLORIDE 0.9 % IV MBP
2.0000 g | Freq: Once | INTRAVENOUS | Status: AC
Start: 2013-02-02 — End: 2013-02-02
  Administered 2013-02-02: 2 g via INTRAVENOUS
  Filled 2013-02-02: qty 2000

## 2013-02-02 MED ORDER — SODIUM CHLORIDE 0.9 % IV SOLN
INTRAVENOUS | Status: DC
Start: 2013-02-02 — End: 2013-02-02
  Administered 2013-02-02: 1000 mL/h via INTRAVENOUS

## 2013-02-02 NOTE — Plan of Care (Signed)
Patient arrived to [facility] via stretcher from ED accompanied by RN at about 2100.  Patient oriented to room and call bell. Policies and procedures explained. Patient verbalized understanding.   Patient alert and oriented to person, place, and time. Routine admission assessment performed. No distress reported at this time.  Bed in lowest position. Call bell within reach. Instructed to call for assistance. Verbalized understanding. Will continue to monitor.

## 2013-02-02 NOTE — ED Notes (Signed)
[name] came to ED via EMS from [facility] complaining of generalized weakness and pain all over.

## 2013-02-02 NOTE — ED Provider Notes (Signed)
Physician/Midlevel provider first contact with patient: 02/02/13 1535         History   No chief complaint on file.    HPI    Time seen: 1535    Historian: patient    Chief complaint: nausea and vomiting for three days; chest pain today    HPI: the pt states that she has been nauseated with vomiting for several days, with   decreased intake; she also states that she developed intermittent vague chest pain   this morning, without SOB or radiation    Time of onset: three days ago    Severity: nausea and vomiting are moderate; chest pain is mild    Quality: chest pain is vague/aching    Improved by: nothing    Worsened by: nothing    Accompanied by: weakness    Complaint experienced before: yes     Past Medical History   Diagnosis Date   . Diabetes mellitus without complication    . Hypertensive disorder    . Arthritis      rheumatoid     Past Surgical History   Procedure Date   . Joint replacement 2005     BILATERAL THR   . Fracture surgery    . Eye surgery    . Hysterectomy 1980'S   . Block, epidural steroid / lumb-sac 12/18/2012     Procedure: BLOCK, EPIDURAL STEROID / LUMB-SAC;  Surgeon: Caro Laroche, MD;  Location: [facility];  Service: Anesthesiology;  Laterality: N/A;   . Block, epidural steroid / lumb-sac 01/01/2013     Procedure: BLOCK, EPIDURAL STEROID / LUMB-SAC;  Surgeon: Caro Laroche, MD;  Location: [facility];  Service: Anesthesiology;  Laterality: N/A;     History reviewed. No pertinent family history.    Social  History   Substance Use Topics   . Smoking status: Never Smoker    . Smokeless tobacco: Not on file   . Alcohol Use: No   .   Allergies   Allergen Reactions   . Azithromycin    . Dilaudid (Hydromorphone Hcl)    . Erythromycin    . Iodine      Iv contrast     Current/Home Medications    AMLODIPINE (NORVASC) 5 MG TABLET    Take 5 mg by mouth daily.    CALCIUM-VITAMIN D (OSCAL-500) 500-200 MG-UNIT PER TABLET    Take 1 tablet by mouth daily.    FUROSEMIDE (LASIX) 20 MG TABLET    Take 20 mg by mouth daily.      GABAPENTIN (NEURONTIN) 600 MG TABLET    Take 600 mg by mouth 2 (two) times daily.    INSULIN ASPART (NOVOLOG) 100 UNIT/ML INJECTION    Inject 1-3 Units into the skin 3 (three) times daily before meals.    INSULIN GLARGINE (LANTUS) 100 UNIT/ML INJECTION    Inject 15 Units into the skin nightly.    LISINOPRIL (PRINIVIL,ZESTRIL) 2.5 MG TABLET    Take 1 tablet (2.5 mg total) by mouth daily.    MAGNESIUM OXIDE (MAG-OX) 400 MG TABLET    Take 400 mg by mouth daily.    METHOTREXATE (RHEUMATREX) 2.5 MG TABLET    Take 7.5 mg by mouth every mon, wed and fri.    NYSTATIN (MYCOSTATIN) 100000 UNIT/ML SUSPENSION    Take 4 mLs (400,000 Units total) by mouth 4 (four) times daily.    PANTOPRAZOLE (PROTONIX) 40 MG TABLET    Take 40 mg by mouth daily.    PREDNISONE (DELTASONE)  5 MG TABLET    Take 5 mg by mouth daily.    SENNA-DOCUSATE (PERICOLACE) 8.6-50 MG PER TABLET    Take 1 tablet by mouth daily.    SUCRALFATE (CARAFATE) 1 G TABLET    Take 1 g by mouth daily.    VITAMIN B-12 (CYANOCOBALAMIN) 500 MCG TABLET    Take 500 mcg by mouth daily.      Review of Systems   Constitutional: Positive for appetite change and fatigue. Negative for fever, chills and diaphoresis.   HENT: Negative for ear pain, sore throat, trouble swallowing, neck pain, neck stiffness and voice change.    Eyes: Negative for visual disturbance.   Respiratory: Negative for cough, choking, shortness of breath, wheezing and stridor.    Cardiovascular: Positive for chest pain. Negative for leg swelling.   Gastrointestinal: Positive for nausea and vomiting. Negative for abdominal pain and diarrhea.   Genitourinary: Negative for dysuria, hematuria and flank pain.   Musculoskeletal: Negative for back pain and arthralgias.   Skin: Negative for color change and rash.   Neurological: Positive for weakness and headaches. Negative for dizziness, facial asymmetry and speech difficulty.   Psychiatric/Behavioral: Negative for confusion and decreased concentration.     Physical Exam     BP 102/50  Pulse 92  Temp 99.4 F (37.4 C)  Resp 25  Ht 1.753 m  Wt 48.535 kg  BMI 15.79 kg/m2  SpO2 98%    Physical Exam   Constitutional: She is oriented to person, place, and time. She appears well-developed. She appears distressed.   HENT:        TM's normal   Eyes: EOM are normal. Pupils are equal, round, and reactive to light.   Neck: Normal range of motion. Neck supple. No JVD present.   Cardiovascular: Normal rate, regular rhythm and normal heart sounds.    Pulmonary/Chest: Effort normal and breath sounds normal. No respiratory distress.   Abdominal: Soft. She exhibits no distension. There is no tenderness. There is no rebound and no guarding.   Musculoskeletal: She exhibits no edema and no tenderness.   Neurological: She is alert and oriented to person, place, and time. No cranial nerve deficit.        Diffuse moderate weakness    Skin: Skin is warm and dry. No rash noted. No erythema. No pallor.   Psychiatric: She has a normal mood and affect. Her behavior is normal. Judgment and thought content normal.       MDM and ED Course     ED Medication Orders      Start     Status Ordering Provider    02/02/13 1544      Every 1 hour,   Status:  Discontinued      Route: Intravenous         Discontinued Terri Skains    02/02/13 1542   0.9%  NaCl infusion   Every 1 hour      Route: Intravenous         Last MAR action:  Stopped Katrine Radich G            EKG - NSR @ 94 with no R-waves in V2-V5 with NSSTT's - there are T-waves changes   compared to 01/07/13 and 07/15/12    Case discussed with Dr Concha Se     MDM  Number of Diagnoses or Management Options  Chest pain:   Elevated LFTs:   Nausea and vomiting:   Pyuria:   Weakness:  Amount and/or Complexity of Data Reviewed  Clinical lab tests: ordered and reviewed    Risk of Complications, Morbidity, and/or Mortality  Presenting problems: moderate  Diagnostic procedures: moderate  Management options: moderate    Patient Progress  Patient progress:  stable    Procedures    Clinical Impression & Disposition     Clinical Impression  Final diagnoses:   Nausea and vomiting   Elevated LFTs   Weakness   Chest pain   Pyuria      ED Disposition     Admit Admitting Physician: Susann Givens A [[id number]]  Diagnosis: Nausea and vomiting [[id number]]  Estimated Length of Stay: 3 - 5 Days  Tentative Discharge Plan?: Home or Self Care [1]  Patient Class: Inpatient [101]  I certify that inpatient services are medically necessary for this patient. Please see H&P and MD progress notes for additional information about the patient's course of treatment. For Medicare patients, services provided in accordance with 412.3 and expected LOS to be greater than 2 midnights for Medicare patients.: Yes           New Prescriptions    No medications on file           Terri Skains, MD  02/03/13 [phone]

## 2013-02-03 ENCOUNTER — Inpatient Hospital Stay: Payer: Medicare Other

## 2013-02-03 LAB — GLUCOSE WHOLE BLOOD - POCT
Whole Blood Glucose POCT: 98 mg/dL (ref 70–100)
Whole Blood Glucose POCT: 150 mg/dL — ABNORMAL HIGH (ref 70–100)
Whole Blood Glucose POCT: 141 mg/dL — ABNORMAL HIGH (ref 70–100)
Whole Blood Glucose POCT: 147 mg/dL — ABNORMAL HIGH (ref 70–100)

## 2013-02-03 LAB — ECG 12-LEAD
Atrial Rate: 93 {beats}/min
P Axis: 23 degrees
P-R Interval: 160 ms
Q-T Interval: 352 ms
QRS Duration: 90 ms
QTC Calculation (Bezet): 437 ms
R Axis: -30 degrees
T Axis: 10 degrees
Ventricular Rate: 93 {beats}/min

## 2013-02-03 LAB — HEMOGLOBIN A1C: Hemoglobin A1C: 8.9 % — ABNORMAL HIGH (ref 0.0–6.0)

## 2013-02-03 MED ORDER — INSULIN ASPART 100 UNIT/ML SC SOLN
1.0000 [IU] | Freq: Three times a day (TID) | SUBCUTANEOUS | Status: DC | PRN
Start: 2013-02-03 — End: 2013-02-05

## 2013-02-03 MED ORDER — DEXTROSE 50 % IV SOLN
25.0000 mL | INTRAVENOUS | Status: DC | PRN
Start: 2013-02-03 — End: 2013-02-05

## 2013-02-03 MED ORDER — ACETAMINOPHEN 325 MG PO TABS
650.0000 mg | ORAL_TABLET | Freq: Four times a day (QID) | ORAL | Status: DC | PRN
Start: 2013-02-03 — End: 2013-02-05

## 2013-02-03 MED ORDER — ENOXAPARIN SODIUM 40 MG/0.4ML SC SOLN
40.0000 mg | Freq: Every day | SUBCUTANEOUS | Status: DC
Start: 2013-02-03 — End: 2013-02-03

## 2013-02-03 MED ORDER — MORPHINE SULFATE 2 MG/ML IJ/IV SOLN (WRAP)
1.0000 mg | Status: DC | PRN
Start: 2013-02-03 — End: 2013-02-04
  Administered 2013-02-03 – 2013-02-04 (×4): 1 mg via INTRAVENOUS
  Filled 2013-02-03 (×4): qty 1

## 2013-02-03 MED ORDER — SENNOSIDES-DOCUSATE SODIUM 8.6-50 MG PO TABS
1.0000 | ORAL_TABLET | Freq: Every day | ORAL | Status: DC
Start: 2013-02-03 — End: 2013-02-05
  Administered 2013-02-03 – 2013-02-05 (×3): 1 via ORAL
  Filled 2013-02-03 (×3): qty 1

## 2013-02-03 MED ORDER — AMLODIPINE BESYLATE 5 MG PO TABS
5.0000 mg | ORAL_TABLET | Freq: Every day | ORAL | Status: DC
Start: 2013-02-03 — End: 2013-02-05
  Administered 2013-02-03 – 2013-02-05 (×3): 5 mg via ORAL
  Filled 2013-02-03 (×3): qty 1

## 2013-02-03 MED ORDER — GLUCAGON HCL (RDNA) 1 MG IJ SOLR
1.0000 mg | INTRAMUSCULAR | Status: DC | PRN
Start: 2013-02-03 — End: 2013-02-05

## 2013-02-03 MED ORDER — LISINOPRIL 5 MG PO TABS
2.5000 mg | ORAL_TABLET | Freq: Every day | ORAL | Status: DC
Start: 2013-02-03 — End: 2013-02-05
  Administered 2013-02-03 – 2013-02-05 (×3): 2.5 mg via ORAL
  Filled 2013-02-03 (×3): qty 1

## 2013-02-03 MED ORDER — PANTOPRAZOLE SODIUM 40 MG PO TBEC
40.0000 mg | DELAYED_RELEASE_TABLET | Freq: Every morning | ORAL | Status: DC
Start: 2013-02-03 — End: 2013-02-05
  Administered 2013-02-03 – 2013-02-05 (×3): 40 mg via ORAL
  Filled 2013-02-03 (×3): qty 1

## 2013-02-03 MED ORDER — SODIUM CHLORIDE 0.9 % IV MBP
1.0000 g | INTRAVENOUS | Status: DC
Start: 2013-02-03 — End: 2013-02-04
  Administered 2013-02-03: 1 g via INTRAVENOUS
  Filled 2013-02-03 (×2): qty 1000

## 2013-02-03 MED ORDER — ENOXAPARIN SODIUM 30 MG/0.3ML SC SOLN
30.0000 mg | Freq: Every day | SUBCUTANEOUS | Status: DC
Start: 2013-02-03 — End: 2013-02-05
  Administered 2013-02-03 – 2013-02-05 (×3): 30 mg via SUBCUTANEOUS
  Filled 2013-02-03 (×3): qty 0.3

## 2013-02-03 MED ORDER — GLUCOSE 40 % PO GEL
15.0000 g | ORAL | Status: DC | PRN
Start: 2013-02-03 — End: 2013-02-05

## 2013-02-03 MED ORDER — NYSTATIN 100000 UNIT/ML MT SUSP
400000.0000 [IU] | Freq: Four times a day (QID) | OROMUCOSAL | Status: DC
Start: 2013-02-03 — End: 2013-02-05
  Administered 2013-02-03 – 2013-02-05 (×5): 400000 [IU] via ORAL
  Filled 2013-02-03 (×6): qty 5

## 2013-02-03 MED ORDER — INSULIN GLARGINE 100 UNIT/ML SC SOLN
15.0000 [IU] | Freq: Every evening | SUBCUTANEOUS | Status: DC
Start: 2013-02-03 — End: 2013-02-04
  Administered 2013-02-03: 15 [IU] via SUBCUTANEOUS
  Filled 2013-02-03: qty 150

## 2013-02-03 NOTE — Progress Notes (Signed)
Severe Sepsis Screen    Date: 02/03/2013 Time: 4:26 AM  Nurse Signature: Glean Salvo    Exclusions:      Patients meeting the following criteria are excluded from screening:     []  Suspicion or diagnosis of sepsis is documented and until 72 hours after antibiotics started or last regimen change:   - If Yes, Date of Documented Sepsis:                                          - If Yes, Date of last change in antibiotics:                                           []  Surgery - No screening for 24 hours after surgery   - If Yes, Date of Surgery:                                           []  Arctic Sun hypothermia protocol- Resume screening when arctic sun complete   []  Comfort Care Orders- Do not resume screening    Did you check any of the boxes above?     [x]  No, Continue to section A   []  Yes, Stop Here, Patient Excluded from Sepsis Screening. If screening should resume in the future, place "sticky note to treatment team" with date/time of when screening should resume. Communicate patient excluded from screening due to Comfort Care Orders using "sticky note to treatment team."    A. Infection:      Does your patient have ONE or more of the following infection criteria?     [x]  Documented Infection - Does the patient have positive culture results (from blood, sputum, urine, etc)?   [x]  Anti-Infective Therapy - Is the patient receiving antibiotic, antifungal, or other anti-infective therapy?   []  Pneumonia - Is there documentation of pneumonia (X Ray, etc)?   []  WBC's - Have WBC's been found in normally sterile fluid (urine, CSF, etc.)?   []  Perforated Viscus - Does the patient have a perforated hollow organ (bowel)?    A.  Did you check any of the boxes above?     []  No, Stop Here and Sepsis Screen Negative   [x]  Yes, continue to section B    B. SIRS:      Does your patient have TWO or more of the following SIRS criteria?     []  Temperature - Is the patient's temperature: Temp: 99.9 F (37.7 C) (02/03/13  0106)   - Greater than or equal to 38.3 degrees C (greater than 100.9 degrees F)?   - Less than or equal to 36 degrees C (less than or equal to 96.8 degrees F)?     []  Heart Rate: Heart Rate: 78  (02/03/13 0413)   - Is the patient's heart rate greater than or equal to 90 bpm?     []  Respiratory: Resp Rate: 20  (02/03/13 0413)   - Is the patient's respiratory rate greater than 20?     [x]  WBC Count - Is the patient's WBC count:   Lab 02/02/13 1544   WBC 17.03*       - Greater  than or equal to 12,000/mm3 OR   - Less than or equal to 4,000/mm3 OR    - Are there greater than 10% immature neutrophils (bands)?     []  Glucose >140 without diabetes or on steroids?   Lab 02/02/13 1544   GLU 113*         []  Significant edema is present?    B.  Did you check two or more of the boxes above?     [x]  No, Stop Here and Sepsis Screen Negative   []  Yes, contact Charge Nurse, continue to section C    C. ACUTE Organ Dysfunction:      Does your patient have ONE or more of the following organ dysfunction? (May need to wait for lab results for assessment - see below) Organ dysfunction must be a result of the sepsis NOT CHRONIC conditions.     []  Cardiovascular - Does the patient have a: BP: 120/60 mmHg (02/03/13 0413)   - Systolic Blood Pressure less than or equal to 90 mmHg OR   - Systolic Blood Pressure has dropped 40 mmHg or more from baseline OR   - Mean Arterial Pressure less than or equal to 70 mmHg (for at least one hour despite fluid resuscitation OR   - require vasopressor support?     []  Respiratory - Does the patient have new hypoxia defined by any of the following?   - A sustained increase in oxygen requirements by at least 2L/min on NC or 28% FiO2 within the last 24 hrs OR   - A persistent decrease in oxygen saturation of greater than or equal to 5% lasting at least four or more hours and occurring within the last 24 hours     []  Renal - Does the patient have:   - low urine output (e.g. Less than 0.5 mL/kg/HR for  one hour despite adequate fluid resuscitation OR   - Increased creatinine (greater than 50% increase from baseline) OR   - require acute dialysis?     []  Hematologic - Does the patient have:   - Low platelet count (less than 100,000 mm3)   Lab 02/02/13 1544   PLT 207    OR   - INR/aPTT greater than upper limit of normal? No results found for this basename: INR:1 in the last 168 hours OR No results found for this basename: APTT:1 in the last 168 hours     []  Metabolic - Does the patient have a high lactate (plasma lactate greater than or equal to 2.4 mMol/L?   Lab 02/02/13 1544   LACTATE 1.4         []  Hepatic - Are the patient's liver enzymes elevated (ALT greater than 72 IU/L or Total Bilirubin greater than 2 MG/dL)?   Lab 02/02/13 1544   BILITOTAL 0.6   ALT 136*         []  CNS - Does the patient have altered consciousness or reduced Glasgow Coma Scale?     Other Active Diagnoses that may be contributing to signs of end organ dysfunction (Ex. Chronic kidney disease, cirrhosis):   - ________________________________________________________________      C.  Did you check any of the boxes above?     []  No, Sepsis Screen Negative   []  YES:  A) Infection + B) SIRS + C) Acute Organ Dysfunction = Positive Screen for Severe Sepsis. Notify attending (house officer during off-hours).     Notify Attending/House Technical sales engineer and document in Complex Assessment under provider  notification   - Name of physician notified:                                           - Date/Time Notifiied:                                             Document actions:   _0  Lactate drawn   _1  Blood Cultures obtained   _2  Antibiotics initiated or modified   _3   IV Fluid administered 0.9% NS __________ mLs given   Nursing Comments/Narrative:    - ______________________________________________________________     The Surviving Sepsis Guidelines recommend the following interventions to be completed within one hour of a positive sepsis  screen.   Obtain new blood cultures prior to antibiotic administration if not done within the last 24 hours.   Obtain lactate level, if initial lactate > 105mol, repeat lactate in 2 hours for goal decrease 10-20%; If there is not a decrease call [facility], hospital  If SBP < 90 or MAP < 65 or lactate greater than 4 mmol/dl; Start 0.9% NS IV Fluid Bolus of 30 mL/Kg (minimum) to maintain MAP > 65    Initiate vasopressors for hypotension not responding to fluid resuscitation (1st line Norepinephrine 1 - 300 mcg/min IV) - Patient must be in CCU if requires vasopressor   Initiate or escalate antibiotic therapy   Goal urine output greater than/equal to 0.5 mL/kg/hr

## 2013-02-03 NOTE — Plan of Care (Addendum)
Problem: Pain  Goal: Patient's pain/discomfort is manageable  Outcome: Progressing  VSS. C/O mild abd pain and mild nausea this am that resolved with holding food. Stated I dont feel like eating but was NPO for ultrasound of abd. Later she pt stated was hungry. IVF infusing per orders. Voiding several times incontinently. Skin care done with proshield. Turned and repositioned. Currently off the unit for [place]. Will continue to monitor vs, tele and follow the plan of care.       Lunch time accucheck 150. Not covered due to NPO

## 2013-02-03 NOTE — H&P (Signed)
Patient Type: I     ATTENDING PHYSICIAN: Susann Givens, MD     REASON FOR HOSPITALIZATION:  Nausea, vomiting, and urinary tract infection with abnormal liver function  tests.     HISTORY OF PRESENT ILLNESS:  This is a 79 year old female who was brought to the emergency room because  of a few-day history of nausea, vomiting, and unable to keep any food or  fluid associated with intermittent, vague chest wall pain on the morning of  presentation without shortness of breath or radiation.     PAST MEDICAL HISTORY:  1.  Diabetes mellitus type 2.  2.  Hypertension.  3.  Rheumatoid arthritis.  4.  Bilateral total hip replacement.  5.  History of degenerative disease of the spine.     ALLERGIES:  ZITHROMAX, DILAUDID, ERYTHROMYCIN, IODINE.     SOCIAL HISTORY:  No alcohol, smoking, or drug use.     CURRENT MEDICATIONS:  Amlodipine 5 mg a day, Os-Cal 500 mg one a day, Lasix 20 mg daily,  gabapentin 600 mg b.i.d., NovoLog sliding scale, Lantus 15 units nightly,  lisinopril 2.5 mg a day, magnesium oxide 400 mg daily, methotrexate 7.5 mg  three times a week, Mycostatin suspension topically 4 times daily, Protonix  40 mg a day, prednisone 5 mg a day, Peri-Colace one a day, sucralfate 1  gram daily, and vitamin B12 500 mcg daily.     PHYSICAL EXAMINATION:  GENERAL:  Shows an elderly cachectic female.  VITAL SIGNS:  Blood pressure 102/50, heart rate is 90, temperature 99.4.   SpO2 99% on room air.  HEAD AND NECK:  She is completely c edentulous.  Oropharynx is dry.  JVP  was flat.    RESPIRATORY:  Lung fields were clear.  HEART:  Regular rate and rhythm.  ABDOMEN:  Nondistended and benign.  EXTREMITIES:  Shows emaciation and diffuse moderate weakness.    PSYCHIATRIC:  Evaluation is normal.  NEUROLOGIC:  She was grossly intact.     INVESTIGATIONS DONE SO FAR:  White cell count is 17.3 with a left shift, hematocrit 27.7.  Glucose 113,  BUN 27, creatinine 1.1, sodium 131, potassium 4.7, AST 192, ALT 136,  alkaline phosphatase 401.   Albumin 2.4.  Troponin 0.04.  BNP 152.   Urinalysis was positive for urinary tract infection.     ASSESSMENT AND PLAN:  This is an elderly female who comes here with nausea, vomiting,  dehydration, abnormal liver function tests, and urinary tract infection.   We need to rule out cholecystitis.  Ultrasound of the abdomen will be done.   Chest x-ray will be done if it has not been done yet.  Urine will be sent  for culture.  All preadmission medications will be reinstituted except I  will hold the prednisone.  Blood sugar and blood pressure will be monitored  closely.  Gastrointestinal and deep venous thrombosis prophylaxis obviously  will be instituted.           D:  02/03/2013 08:23 AM by Dr. Barnie Del A. Kevin Fenton, MD [phone])  T:  02/03/2013 09:01 AM by Minda Meo      (Conf: [id number]) (Doc ID: [id number])

## 2013-02-03 NOTE — Plan of Care (Signed)
Problem: Safety  Goal: Patient will be free from injury during hospitalization  Outcome: Progressing  Pt encouraged to call for assist when needed and safety on admission. Exit bed alarm activated

## 2013-02-03 NOTE — Plan of Care (Signed)
Problem: Pain  Goal: Patient's pain/discomfort is manageable  Outcome: Progressing  Verbalized nausea on admission, PRN med given. No complaint of pain or respiratory distress noted thus far during monitoring. However, abdominal tenderness verbalized during assessment

## 2013-02-03 NOTE — Plan of Care (Signed)
Problem: Hemodynamic Status: Cardiac  Goal: Stable vital signs and fluid balance  Outcome: Progressing  EKG from ED shows sinus with sinus arhythmia. At about 2340, Monitor Tech reported SVT for 35 seconds. Pt when assessed was asymptomatic. Dr. Elza Rafter paged

## 2013-02-03 NOTE — Plan of Care (Signed)
Problem: Psychosocial and Spiritual Needs  Goal: Demonstrates ability to cope with hospitalization/illness  Outcome: Progressing  Able to verbalized needs and asked questions

## 2013-02-03 NOTE — Progress Notes (Addendum)
Pt A&Ox4, in NSR, VSS, on RA, pt was moaning and reported abdominal discomfort, notified Dr. Kevin Fenton, morphine ordered, allergy to hydromorphone reminder came up on EPIC screen, called pharmacy for clarification, pt reported she had taken morphine in the past, and reported relief from discomfort after it given. #22 IV changed to RFA, Call light within reach.

## 2013-02-04 LAB — GLUCOSE WHOLE BLOOD - POCT
Whole Blood Glucose POCT: 63 mg/dL — ABNORMAL LOW (ref 70–100)
Whole Blood Glucose POCT: 77 mg/dL (ref 70–100)
Whole Blood Glucose POCT: 87 mg/dL (ref 70–100)
Whole Blood Glucose POCT: 63 mg/dL — ABNORMAL LOW (ref 70–100)
Whole Blood Glucose POCT: 87 mg/dL (ref 70–100)

## 2013-02-04 LAB — BASIC METABOLIC PANEL
BUN: 7 mg/dL (ref 7–21)
CO2: 23 (ref 22–29)
Calcium: 7.9 mg/dL (ref 7.9–10.6)
Chloride: 103 (ref 98–107)
Creatinine: 0.6 mg/dL (ref 0.6–1.0)
Glucose: 73 mg/dL (ref 70–100)
Potassium: 3.6 (ref 3.5–5.1)
Sodium: 135 — ABNORMAL LOW (ref 136–145)

## 2013-02-04 LAB — HEPATITIS C ANTIBODY: Hepatitis C, AB: NONREACTIVE

## 2013-02-04 LAB — GFR: EGFR: >60

## 2013-02-04 LAB — CBC
Hematocrit: 26.1 % — ABNORMAL LOW (ref 37.0–47.0)
Hgb: 8.6 g/dL — ABNORMAL LOW (ref 12.0–16.0)
MCH: 30 pg (ref 28.0–32.0)
MCHC: 33 g/dL (ref 32.0–36.0)
MCV: 90.9 fL (ref 80.0–100.0)
MPV: 10.8 fL (ref 9.4–12.3)
Nucleated RBC: 0 (ref 0–1)
Platelets: 194 (ref 140–400)
RBC: 2.87 — ABNORMAL LOW (ref 4.20–5.40)
RDW: 16 % — ABNORMAL HIGH (ref 12–15)
WBC: 9 (ref 3.50–10.80)

## 2013-02-04 LAB — HEPATITIS B CORE ANTIBODY, IGM: Hepatitis B Core IgM: NONREACTIVE

## 2013-02-04 LAB — HEPATITIS B SURFACE ANTIGEN W/ REFLEX TO CONFIRMATION: Hepatitis B Surface Antigen: NONREACTIVE

## 2013-02-04 LAB — HEPATITIS A ANTIBODY, IGM: Hep A IgM: NONREACTIVE

## 2013-02-04 LAB — HEPATITIS B SURFACE ANTIBODY: HEPATITIS B SURFACE ANTIBODY: 847.09

## 2013-02-04 LAB — PT/INR
PT INR: 1.1 (ref 0.9–1.1)
PT: 13.9 (ref 12.6–15.0)

## 2013-02-04 LAB — HEMOLYSIS INDEX: Hemolysis Index: 85 — ABNORMAL HIGH (ref 0–9)

## 2013-02-04 MED ORDER — SODIUM CHLORIDE 0.9 % IV MBP
2.2500 g | Freq: Three times a day (TID) | INTRAVENOUS | Status: DC
Start: 2013-02-04 — End: 2013-02-05
  Administered 2013-02-04 – 2013-02-05 (×4): 2.25 g via INTRAVENOUS
  Filled 2013-02-04 (×8): qty 2.25

## 2013-02-04 MED ORDER — ONDANSETRON HCL 4 MG/2ML IJ SOLN
4.0000 mg | Freq: Three times a day (TID) | INTRAMUSCULAR | Status: DC | PRN
Start: 2013-02-04 — End: 2013-02-05
  Administered 2013-02-04 – 2013-02-05 (×2): 4 mg via INTRAVENOUS
  Filled 2013-02-04 (×3): qty 2

## 2013-02-04 MED ORDER — DEXTROSE-SODIUM CHLORIDE 5-0.45 % IV SOLN
INTRAVENOUS | Status: DC
Start: 2013-02-04 — End: 2013-02-05

## 2013-02-04 MED ORDER — MORPHINE SULFATE 2 MG/ML IJ/IV SOLN (WRAP)
2.0000 mg | Status: DC | PRN
Start: 2013-02-04 — End: 2013-02-05
  Administered 2013-02-04: 2 mg via INTRAVENOUS
  Filled 2013-02-04 (×2): qty 1

## 2013-02-04 NOTE — Progress Notes (Signed)
Pt continue to complain of costant abdominal pain also having complain of constant nausea with intermittent emesis pt incontinent of large amt of urine no stool c/o 1mg  not effective for pain relief MD increased to 2mg  dose given @ 1800 medicated with IV zofran pt blood sugars 68/67 and unablde to eat much at all spoke with Dr. Kevin Fenton IV fluids changed to D51/2ns 100/hr pt for hida scan in am

## 2013-02-04 NOTE — Progress Notes (Signed)
HISTORY OF PRESENT ILLNESS:  The patient is a 79 year old lovely lady who complains of nausea.  She also  complains of pain.  She says the pain is all over the body.  She does  complain of pain in the epigastrium and right hypochondrium.  She complains  of nausea.  The patient, however, is having lunch without any problem.  No  fever, chills, rigors.  She is on empiric ceftriaxone .  She had an  ultrasound done, which because of the gas was a poor visualization;  however, there is no cholelithiasis or any common bile duct dilatation.  She has  elevated transaminases  and alkaline phosphatase.     PHYSICAL EXAMINATION:  GENERAL:  The patient alert, oriented, answers all questions.  White count  is elevated.  VITAL SIGNS:  Her pulse is 79 per minute, blood pressure is 133/69,  respiration 18, temperature 98.9.  HEENT:  No pallor, no icterus, clubbing, lymphadenopathy.  LUNGS:  Clear.  ABDOMEN:  She has a little tenderness in the epigastrium and generalized  tenderness.  Murphy sign is negative.     LABORATORY AND DIAGNOSTIC DATA:  Sodium 131, potassium 4.7, chloride 99, bicarbonate 22, BUN is 27,  creatinine is 1.1, hemoglobin 9.1, hematocrit 27.7.  White count is 17.03.   AST was 192, ALT is 136.  Alkaline phosphatase is 401.  Albumin is low at  2.4.  Urine was cloudy, large leukocyte esterase.  Hemoglobin A1c is 8.9.   Urine culture is mixed flora, not significant.  Her ultrasound of her  abdomen is limited study due to overlying gas in and technical factors.   The image of the hepatic ductsa nd bile ducts appears to be unremarkable.  Right kidney  not visualized.  Pancreas was not well seen.  She has mild hydronephrosis  on the left side.     ASSESSMENT AND PLAN:  1.  The patient is a 79 year old lovely lady who presented with symptoms of  nausea, vomiting, back pain, abdominal discomfort with positive urine for  leukocyte esterase.  She has also elevated transaminases as well as  alkaline phosphatase.  The  patient's ultrasound is not really contributory  due to poor quality due to overlying gas.  She has also white count.  The  patient will be treated for acute urinary tract infection.  She is on  ceftriaxone; will change to Zosyn in case she has acute cholecystitis.  2.  Abnormal liver function tests with elevated transaminases and alkaline  phosphatase.  We will rule out acalculous cholecystitis.  The patient will  have a HIDA scan done, and will change the antibiotic to Zosyn  3.  Diabetes mellitus on sliding scale.  4.  Past history of rheumatoid arthritis.  5.  History of hypertension.  Blood pressure is good control.  She will be  on gastrointestinal and deep venous thrombosis prophylaxis.

## 2013-02-04 NOTE — Progress Notes (Signed)
MET W/ PT. & NEICES...STATES WAS REC. SHORT TERM REHAB AT [facility].Marland KitchenMarland KitchenHAD NOT COMPLETED COURSE. DOES NOT WANT TO RETURN TO [facility]. DESIRES [facility]...Marland KitchenREFERRAL SENT

## 2013-02-04 NOTE — Progress Notes (Signed)
Nutrition Follow-up    Assessment: Pt observed eating lunch; Noted difficulty bringing sandwich to mouth without dropping several times; Inquired if pt needed / wanted assistance. Denied that she required help, though when initially asked how she was doing eating, she replied, "not well." No further explanation provided. Pt with noted poor dentition; Denies difficulty chewing food as well.     Per documentation of wound care, Stage 2 to coccyx.     Active Hospital Problems    Diagnosis   . Nausea and vomiting       Orders Placed This Encounter   Procedures   . Diet consistent carbohydrate       Current Meds: Noted.        amLODIPine 5 mg Daily   enoxaparin 30 mg Daily   insulin glargine 15 Units QHS   lisinopril 2.5 mg Daily   nystatin 400,000 Units QID   pantoprazole 40 mg QAM AC   piperacillin-tazobactam 2.25 g Q8H SCH   senna-docusate 1 tablet Daily   [DISCONTINUED] cefTRIAXone 1 g Q24H SCH          Recent Labs: Noted.     Lab 02/04/13 1439 02/02/13 1544   WBC 9.00 17.03*   HGB 8.6* 9.1*   HCT 26.1* 27.7*   MCV 90.9 92.6   PLT 194 207       Lab 02/04/13 1439 02/02/13 1544   NA 135* 131*   K 3.6 4.7   CL 103 99   CO2 23 22   BUN 7 27*   CREAT 0.6 1.1*   GLU 73 113*   CA 7.9 8.3   MG -- 1.8   PHOS -- --   EGFR >60.0 55.8       Lab 02/02/13 1544   ALB 2.4*       No intake or output data in the 24 hours ending 02/04/13 1639    Anthropometrics  Height: 177.8 cm (5\' 10" )  Weight: 51.7 kg (113 lb 15.7 oz)  Weight Change: 6.52   IBW/kg (Calculated) Female: 75.48 kg  IBW/kg (Calculated) Female: 68.16 kg  BMI (calculated): 16.4     Estimated Nutrition Needs:  Estimated Energy Needs  Total Energy Estimated Needs: 1350-1650 kcal /day   Method for Estimating Needs: TEE sf 1.3-1.5; 26-32 kcal / kg    Estimated Protein Needs  Total Protein Estimated Needs: 50-65 g / day   Method for Estimating Needs: 1 - 1.25 g / kg     Estimated Carbohydrate Needs  Total Carbohydrate Estimated Needs: 135 - 165 g/ day   Method for Estimating  Needs: About 40% estimated energy needs    Fluid Needs  Total Fluid Estimated Needs: 1300 mL / day   Method for Estimating Needs: About 25 mL / kg     Learning Needs: None at this time.     Nutrition Diagnosis:   Predicted Suboptimal Energy Intake related to some motor difficulty feeding self as evidenced by observed behaviors and percent intake during meal.       Intervention: Rec. Consider order oral supplement Ensure Plus BID to aid in adequate PO intake to meet estimated energy needs; Encourage PO intake of all meals and oral supplements as ordered; Provided assistance PRN.     Goals: Pt will complete at least 50-75% of meals and oral supplements daily.     M/E: Will continue to monitor PO intake, lab results; Will follow up within 7 days.     Harrell Gave, MS, RD,  LDN

## 2013-02-05 ENCOUNTER — Inpatient Hospital Stay: Payer: Medicare Other

## 2013-02-05 LAB — GLUCOSE WHOLE BLOOD - POCT: Whole Blood Glucose POCT: 138 mg/dL — ABNORMAL HIGH (ref 70–100)

## 2013-02-05 MED ORDER — SINCALIDE 5 MCG IJ SOLR
0.0200 ug/kg | Freq: Once | INTRAMUSCULAR | Status: AC | PRN
Start: 2013-02-05 — End: 2013-02-05
  Administered 2013-02-05: 1 ug via INTRAVENOUS

## 2013-02-05 MED ORDER — TECHNETIUM TC 99M MEBROFENIN
6.0000 | Freq: Once | Status: AC | PRN
Start: 2013-02-05 — End: 2013-02-05
  Administered 2013-02-05: 6 via INTRAVENOUS

## 2013-02-05 NOTE — Progress Notes (Signed)
Alert and oriented X 2-3. Cooperative with care and meds. Denied acute distress at this time. Discharge instructions provided to the receiving nurse Kennon Rounds at [facility]. T/R Q 2 hrs. She will be picked up by [facility] at 1530. Will continue to monitor

## 2013-02-05 NOTE — Progress Notes (Addendum)
Severe Sepsis Screen    Date: 02/05/2013 Time: 9:44 AM  Nurse Signature: Zahira Brummond L Da-Silveira    Exclusions:      Patients meeting the following criteria are excluded from screening:     []  Suspicion or diagnosis of sepsis is documented and until 72 hours after antibiotics started or last regimen change:   - If Yes, Date of Documented Sepsis:                                          - If Yes, Date of last change in antibiotics:                                           []  Surgery - No screening for 24 hours after surgery   - If Yes, Date of Surgery:                                           []  Arctic Sun hypothermia protocol- Resume screening when arctic sun complete   []  Comfort Care Orders- Do not resume screening    Did you check any of the boxes above?     [x]  No, Continue to section A   []  Yes, Stop Here, Patient Excluded from Sepsis Screening. If screening should resume in the future, place "sticky note to treatment team" with date/time of when screening should resume. Communicate patient excluded from screening due to Comfort Care Orders using "sticky note to treatment team."    A. Infection:      Does your patient have ONE or more of the following infection criteria?     [x]  Documented Infection - Does the patient have positive culture results (from blood, sputum, urine, etc)?   [x]  Anti-Infective Therapy - Is the patient receiving antibiotic, antifungal, or other anti-infective therapy?   []  Pneumonia - Is there documentation of pneumonia (X Ray, etc)?   []  WBC's - Have WBC's been found in normally sterile fluid (urine, CSF, etc.)?   []  Perforated Viscus - Does the patient have a perforated hollow organ (bowel)?    A.  Did you check any of the boxes above?     []  No, Stop Here and Sepsis Screen Negative   [x]  Yes, continue to section B    B. SIRS:      Does your patient have TWO or more of the following SIRS criteria?     []  Temperature - Is the patient's temperature: Temp: 97 F (36.1 C) (02/05/13  0900)   - Greater than or equal to 38.3 degrees C (greater than 100.9 degrees F)?   - Less than or equal to 36 degrees C (less than or equal to 96.8 degrees F)?     []  Heart Rate: Heart Rate: 64  (02/05/13 0723)   - Is the patient's heart rate greater than or equal to 90 bpm?     []  Respiratory: Resp Rate: 20  (02/05/13 0723)   - Is the patient's respiratory rate greater than 20?     []  WBC Count - Is the patient's WBC count:   Lab 02/04/13 1439   WBC 9.00       - Greater  than or equal to 12,000/mm3 OR   - Less than or equal to 4,000/mm3 OR    - Are there greater than 10% immature neutrophils (bands)?     []  Glucose >140 without diabetes or on steroids?   Lab 02/04/13 1439   GLU 73         []  Significant edema is present?    B.  Did you check two or more of the boxes above?     [x]  No, Stop Here and Sepsis Screen Negative   []  Yes, contact Charge Nurse, continue to section C    C. ACUTE Organ Dysfunction:      Does your patient have ONE or more of the following organ dysfunction? (May need to wait for lab results for assessment - see below) Organ dysfunction must be a result of the sepsis NOT CHRONIC conditions.     []  Cardiovascular - Does the patient have a: BP: 145/69 mmHg (02/05/13 0723)   - Systolic Blood Pressure less than or equal to 90 mmHg OR   - Systolic Blood Pressure has dropped 40 mmHg or more from baseline OR   - Mean Arterial Pressure less than or equal to 70 mmHg (for at least one hour despite fluid resuscitation OR   - require vasopressor support?     []  Respiratory - Does the patient have new hypoxia defined by any of the following?   - A sustained increase in oxygen requirements by at least 2L/min on NC or 28% FiO2 within the last 24 hrs OR   - A persistent decrease in oxygen saturation of greater than or equal to 5% lasting at least four or more hours and occurring within the last 24 hours     []  Renal - Does the patient have:   - low urine output (e.g. Less than 0.5 mL/kg/HR for one  hour despite adequate fluid resuscitation OR   - Increased creatinine (greater than 50% increase from baseline) OR   - require acute dialysis?     []  Hematologic - Does the patient have:   - Low platelet count (less than 100,000 mm3)   Lab 02/04/13 1439   PLT 194    OR   - INR/aPTT greater than upper limit of normal?   Lab 02/04/13 1439   INR 1.1    OR No results found for this basename: APTT:1 in the last 168 hours     []  Metabolic - Does the patient have a high lactate (plasma lactate greater than or equal to 2.4 mMol/L?   Lab 02/02/13 1544   LACTATE 1.4         []  Hepatic - Are the patient's liver enzymes elevated (ALT greater than 72 IU/L or Total Bilirubin greater than 2 MG/dL)?   Lab 02/02/13 1544   BILITOTAL 0.6   ALT 136*         []  CNS - Does the patient have altered consciousness or reduced Glasgow Coma Scale?     Other Active Diagnoses that may be contributing to signs of end organ dysfunction (Ex. Chronic kidney disease, cirrhosis):   - ________________________________________________________________      C.  Did you check any of the boxes above?     []  No, Sepsis Screen Negative   []  YES:  A) Infection + B) SIRS + C) Acute Organ Dysfunction = Positive Screen for Severe Sepsis. Notify attending (house officer during off-hours).     Notify Attending/House Technical sales engineer and document in Complex Assessment under provider  notification   - Name of physician notified:                                           - Date/Time Notifiied:                                             Document actions:   _0  Lactate drawn   _1  Blood Cultures obtained   _2  Antibiotics initiated or modified   _3   IV Fluid administered 0.9% NS __________ mLs given   Nursing Comments/Narrative:    - ______________________________________________________________     The Surviving Sepsis Guidelines recommend the following interventions to be completed within one hour of a positive sepsis screen.   Obtain new blood cultures prior to  antibiotic administration if not done within the last 24 hours.   Obtain lactate level, if initial lactate > 62mol, repeat lactate in 2 hours for goal decrease 10-20%; If there is not a decrease call [facility], hospital  If SBP < 90 or MAP < 65 or lactate greater than 4 mmol/dl; Start 0.9% NS IV Fluid Bolus of 30 mL/Kg (minimum) to maintain MAP > 65    Initiate vasopressors for hypotension not responding to fluid resuscitation (1st line Norepinephrine 1 - 300 mcg/min IV) - Patient must be in CCU if requires vasopressor   Initiate or escalate antibiotic therapy   Goal urine output greater than/equal to 0.5 mL/kg/hr

## 2013-02-05 NOTE — Progress Notes (Signed)
Patient was picked up by [facility] Denied distress/discomfort at this time.

## 2013-02-05 NOTE — Progress Notes (Signed)
Patient did not complain of abdominal pain during the night. Tolerated po medications, encouraged patient with eating her meals. Patient's appetite remains very poor. TARP during the night, vss stable continue with ongoing care plan.

## 2013-02-05 NOTE — Discharge Instructions (Signed)
PT/OT EVALUATION  DIET: CONSISTENT CHO

## 2013-02-06 NOTE — Discharge Summary (Signed)
Discharge Date: 02/05/2013     ATTENDING PHYSICIAN:  Susann Givens, MD     HISTORY OF PRESENT ILLNESS:  The patient is a 79 year old female who was transferred from the nursing  home because of nausea and vomiting that had been going on for a few days.   For full details please look at the accompanying history and physical.     HOSPITAL COURSE:  She was admitted to the telemetry unit.  She was in sinus rhythm  throughout.  She was given some IV fluids, analgesics and antiemetics.  She  was treated for a urinary tract infection.  It was thought that she had  some abdominal problems.  An ultrasound was negative for gallstones and a  HIDA scan was negative for cholecystitis.  Her white cell count dropped  down to normal and by the time of discharge she was eating well and she was  well hydrated as well.  She is ready now to be discharged to a subacute  nursing facility to complete her rehab that she was receiving at another  nursing facility.     DISCHARGE DIAGNOSES:  1.  Nausea, vomiting and urinary tract infection, status post treatment,  finished antibiotics.  2.  Type 2 diabetes mellitus.  3.  Hypertension.  4.  Rheumatoid arthritis.  5.  Bilateral total hip replacements.  6.  Severe degenerative disease of the spine.     DISCHARGE MEDICATIONS:  Amlodipine 5 mg daily, calcium with vitamin D 1 a day, Lasix 20 mg daily,  gabapentin 600 mg b.i.d., insulin sliding scale, insulin Glargine 15 units  nightly, lisinopril 2.5 mg daily, magnesium oxide 400 mg daily,  methotrexate 7.5mg  once a week, Nystatin topically, Protonix 40 mg  daily, prednisone 5 mg daily, senna/docusate daily, sucralfate 1 gram  q.i.d. and vitamin B12 500 mcg once a day.       FOLLOWUP:  Follow up with PMD after discharge from the nursing home.           D:  02/05/2013 12:19 PM by Dr. Barnie Del A. Kevin Fenton, MD [phone])  T:  02/06/2013 08:09 AM by Justice Deeds      Everlean Cherry: [id number]) (Doc ID: [id number])

## 2013-02-22 ENCOUNTER — Emergency Department: Payer: Medicare Other

## 2013-02-22 ENCOUNTER — Emergency Department
Admission: EM | Admit: 2013-02-22 | Discharge: 2013-02-22 | Disposition: A | Discharge status: Skilled Nursing Facility | Payer: Medicare Other | Attending: Emergency Medicine | Admitting: Emergency Medicine

## 2013-02-22 DIAGNOSIS — K219 Gastro-esophageal reflux disease without esophagitis: Secondary | ICD-10-CM | POA: Insufficient documentation

## 2013-02-22 DIAGNOSIS — Z794 Long term (current) use of insulin: Secondary | ICD-10-CM | POA: Insufficient documentation

## 2013-02-22 DIAGNOSIS — D649 Anemia, unspecified: Secondary | ICD-10-CM | POA: Insufficient documentation

## 2013-02-22 DIAGNOSIS — Z96649 Presence of unspecified artificial hip joint: Secondary | ICD-10-CM | POA: Insufficient documentation

## 2013-02-22 DIAGNOSIS — M069 Rheumatoid arthritis, unspecified: Secondary | ICD-10-CM | POA: Insufficient documentation

## 2013-02-22 DIAGNOSIS — E1169 Type 2 diabetes mellitus with other specified complication: Secondary | ICD-10-CM | POA: Insufficient documentation

## 2013-02-22 LAB — GLUCOSE WHOLE BLOOD - POCT
Whole Blood Glucose POCT: 145 mg/dL — ABNORMAL HIGH (ref 70–100)
Whole Blood Glucose POCT: 226 mg/dL — ABNORMAL HIGH (ref 70–100)
Whole Blood Glucose POCT: 230 mg/dL — ABNORMAL HIGH (ref 70–100)

## 2013-02-22 LAB — CBC AND DIFFERENTIAL
Basophils Absolute Automated: 0.03 (ref 0.00–0.20)
Basophils Automated: 0 %
Eosinophils Absolute Automated: 0.05 (ref 0.00–0.70)
Eosinophils Automated: 0 %
Hematocrit: 29.5 % — ABNORMAL LOW (ref 37.0–47.0)
Hgb: 9.2 g/dL — ABNORMAL LOW (ref 12.0–16.0)
Immature Granulocytes Absolute: 0.07 — ABNORMAL HIGH
Immature Granulocytes: 1 %
Lymphocytes Absolute Automated: 0.88 (ref 0.50–4.40)
Lymphocytes Automated: 7 %
MCH: 30.2 pg (ref 28.0–32.0)
MCHC: 31.2 g/dL — ABNORMAL LOW (ref 32.0–36.0)
MCV: 96.7 fL (ref 80.0–100.0)
MPV: 9.3 fL — ABNORMAL LOW (ref 9.4–12.3)
Monocytes Absolute Automated: 1.53 — ABNORMAL HIGH (ref 0.00–1.20)
Monocytes: 12 %
Neutrophils Absolute: 10.29 — ABNORMAL HIGH (ref 1.80–8.10)
Neutrophils: 80 %
Nucleated RBC: 0 (ref 0–1)
Platelets: 454 — ABNORMAL HIGH (ref 140–400)
RBC: 3.05 — ABNORMAL LOW (ref 4.20–5.40)
RDW: 16 % — ABNORMAL HIGH (ref 12–15)
WBC: 12.78 — ABNORMAL HIGH (ref 3.50–10.80)

## 2013-02-22 LAB — CK: Creatine Kinase (CK): 42 U/L (ref 29–168)

## 2013-02-22 LAB — COMPREHENSIVE METABOLIC PANEL
ALT: 14 U/L (ref 0–55)
AST (SGOT): 23 U/L (ref 5–34)
Albumin/Globulin Ratio: 0.6 — ABNORMAL LOW (ref 0.9–2.2)
Albumin: 2.7 g/dL — ABNORMAL LOW (ref 3.5–5.0)
Alkaline Phosphatase: 208 U/L — ABNORMAL HIGH (ref 40–150)
BUN: 20 mg/dL (ref 7–21)
Bilirubin, Total: 0.3 mg/dL (ref 0.2–1.2)
CO2: 25 (ref 22–29)
Calcium: 8.8 mg/dL (ref 7.9–10.6)
Chloride: 100 (ref 98–107)
Creatinine: 0.8 mg/dL (ref 0.6–1.0)
Globulin: 4.5 g/dL — ABNORMAL HIGH (ref 2.0–3.6)
Glucose: 161 mg/dL — ABNORMAL HIGH (ref 70–100)
Potassium: 4.3 (ref 3.5–5.1)
Protein, Total: 7.2 g/dL (ref 6.0–8.3)
Sodium: 137 (ref 136–145)

## 2013-02-22 LAB — TROPONIN I: Troponin I: 0.02 ng/mL (ref 0.00–0.09)

## 2013-02-22 LAB — PT AND APTT
PT INR: 1.1 (ref 0.9–1.1)
PT: 13.9 (ref 12.6–15.0)
PTT: 36 (ref 23–37)

## 2013-02-22 LAB — GFR: EGFR: >60

## 2013-02-22 NOTE — ED Notes (Signed)
POCT - 145

## 2013-02-22 NOTE — ED Provider Notes (Signed)
EMERGENCY DEPARTMENT NOTE    Physician/Midlevel provider first contact with patient: 02/22/13 0935         HISTORY OF PRESENT ILLNESS   Historian:Patient, ems, nursing home  Translator Used: No  em caveat: change in mental status    79 y.o. female presents to the ed with mental status change. Nursing home states that pt was unresponsive this am. Upon ems arrival, pt bs was 30.  Pt was given 1/2 amp d50.  Upon recheck of bs by ems bs was 303.  Upon arrival to ed bs was 145 and pt is awake and conversive.     1. Location of symptoms: see above  2. Onset of symptoms:  unknown  3. What was patient doing when symptoms started (Context): unknown  4. Severity:severe  5. Timing: unknown  6. Activities that worsen symptoms:unknown  7. Activities that improve symptoms: d50  8. Quality: unresponsive  9. Radiation of symptoms:unknown  10. Associated signs and Symptoms:none  11. Are symptoms worsening? NO  MEDICAL HISTORY     Past Medical History:  Past Medical History   Diagnosis Date   . Hypertensive disorder    . Arthritis      rheumatoid   . Diabetes mellitus without complication      type 2   . Gastroesophageal reflux disease        Past Surgical History:  Past Surgical History   Procedure Date   . Fracture surgery    . Eye surgery    . Hysterectomy 1980'S   . Block, epidural steroid / lumb-sac 12/18/2012     Procedure: BLOCK, EPIDURAL STEROID / LUMB-SAC;  Surgeon: Caro Laroche, MD;  Location: [facility];  Service: Anesthesiology;  Laterality: N/A;   . Block, epidural steroid / lumb-sac 01/01/2013     Procedure: BLOCK, EPIDURAL STEROID / LUMB-SAC;  Surgeon: Caro Laroche, MD;  Location: [facility];  Service: Anesthesiology;  Laterality: N/A;   . Joint replacement 2005     BILATERAL THR       Social History:  History     Social History   . Marital Status: Widowed     Spouse Name: N/A     Number of Children: N/A   . Years of Education: N/A     Occupational History   . Not on file.     Social History Main Topics   . Smoking status: Former  Smoker     Quit date: 02/03/1939   . Smokeless tobacco: Not on file   . Alcohol Use: No   . Drug Use: No   . Sexually Active:      Other Topics Concern   . Not on file     Social History Narrative   . No narrative on file       Family History:  Family History   Problem Relation Age of Onset   . Heart attack Father        Outpatient Medication:  Previous Medications    AMLODIPINE (NORVASC) 5 MG TABLET    Take 5 mg by mouth daily.    CALCIUM-VITAMIN D (OSCAL-500) 500-200 MG-UNIT PER TABLET    Take 1 tablet by mouth daily.    FUROSEMIDE (LASIX) 20 MG TABLET    Take 20 mg by mouth daily.     GABAPENTIN (NEURONTIN) 600 MG TABLET    Take 600 mg by mouth 2 (two) times daily.    INSULIN ASPART (NOVOLOG) 100 UNIT/ML INJECTION    Inject 1-3 Units into  the skin 3 (three) times daily before meals.    INSULIN GLARGINE (LANTUS) 100 UNIT/ML INJECTION    Inject 15 Units into the skin nightly.    LISINOPRIL (PRINIVIL,ZESTRIL) 2.5 MG TABLET    Take 1 tablet (2.5 mg total) by mouth daily.    MAGNESIUM OXIDE (MAG-OX) 400 MG TABLET    Take 400 mg by mouth daily.    METHOTREXATE (RHEUMATREX) 2.5 MG TABLET    Take 7.5 mg by mouth every mon, wed and fri.    NYSTATIN (MYCOSTATIN) 100000 UNIT/ML SUSPENSION    Take 4 mLs (400,000 Units total) by mouth 4 (four) times daily.    PANTOPRAZOLE (PROTONIX) 40 MG TABLET    Take 40 mg by mouth daily.    PREDNISONE (DELTASONE) 5 MG TABLET    Take 5 mg by mouth daily.    SENNA-DOCUSATE (PERICOLACE) 8.6-50 MG PER TABLET    Take 1 tablet by mouth daily.    SUCRALFATE (CARAFATE) 1 G TABLET    Take 1 g by mouth daily.    VITAMIN B-12 (CYANOCOBALAMIN) 500 MCG TABLET    Take 500 mcg by mouth daily.         REVIEW OF SYSTEMS   Review of Systems   Unable to perform ROS: mental status change   Cardiovascular: Negative for chest pain.   Gastrointestinal: Negative for nausea, vomiting and abdominal pain.       PHYSICAL EXAM   ED Triage Vitals   Enc Vitals Group      BP 02/22/13 0933 185/80 mmHg      Heart Rate  02/22/13 0933 73       Resp Rate 02/22/13 0933 19       Temp 02/22/13 0933 97.8 F (36.6 C)      Temp src --       SpO2 02/22/13 0933 99 %      Weight 02/22/13 0933 59 kg      Height 02/22/13 0933 1.626 m      Head Cir --       Peak Flow --       Pain Score --       Pain Loc --       Pain Edu? --       Excl. in GC? --        Nursing note and vitals reviewed.  Constitutional:  Well developed, well nourished. Awake & Oriented x3.  Head:  Atraumatic. Normocephalic.    Eyes:  PERRL. EOMI. Conjunctivae are not pale.  ENT:  Mucous membranes are dry and intact. Oropharynx is clear and symmetric.  Patent airway.  Neck:  Supple. Full ROM.    Cardiovascular:  Regular rate. Regular rhythm. No murmurs, rubs, or gallops.  Pulmonary/Chest:  No evidence of respiratory distress. Clear to auscultation bilaterally.  No wheezing, rales or rhonchi.   Abdominal:  Soft and non-distended. There is no tenderness. No rebound, guarding, or rigidity.  Back:  Full ROM. Nontender.  Extremities:  No edema. No cyanosis. No clubbing. Full range of motion in all extremities.  Skin:  Skin is warm and dry.  No diaphoresis. No rash.   Neurological:  Alert, awake, and appropriate. Normal speech. Motor normal. Neurological:  Alert and oriented to person, place, and time.  PERRL.  EOMI.  Cranial nerves II-XII are intact.  Strength is equal and 5/5 in the upper and lower extremities bilaterally.   No pronator drift.  Normal speech.  No acute focal neurological deficits can be  appreciated.  Psychiatric:  Good eye contact. Normal interaction, affect, and behavior.    MEDICAL DECISION MAKING   Pt given IV fluids for hydration  CXR to r/o pneumonia  Troponin to r/o ischemia      Pt feels better  Vss. Results discussed with pt.  Pt ate food tray. Feels better.will d/c back to [facility] care.   Will d/c. Pt instructed to follow up with pcp.  Instructed to return for worsening symptoms. Pt agrees.    DISCUSSION      Vital Signs: Reviewed the patient?s vital signs.    Nursing Notes: Reviewed and utilized available nursing notes.  Medical Records Reviewed: Reviewed available past medical records.  Counseling: The emergency provider has spoken with the patient and discussed today?s findings, in addition to providing specific details for the plan of care.  Questions are answered and there is agreement with the plan.      CARDIAC STUDIES    The following cardiac studies were independently interpreted by the Emergency Medicine Physician.  For full cardiac study results please see chart.    Monitor Strip  Interpreted by ED Physician  Rate:71  Rhythm: nsr  ST Changes: none    EKG Interpretation:  Signed and interpreted byED Physician   Time Interpreted: 0954  Comparison: 02/02/13  Rate: 71  Rhythm: NSR  Axis: Normal  Intervals: Normal  Blocks: None  ST segments: No acute changes.  Interpretation: Nonspecific EKG    IMAGING STUDIES    The following imaging studies were independently interpreted by the Emergency Medicine Physician.  For full imaging study results please see chart.      PULSE OXIMETRY    Oxygen Saturation by Pulse Oximetry: 100%  Interventions: none  Interpretation: nml    EMERGENCY DEPT. MEDICATIONS      ED Medication Orders     None          LABORATORY RESULTS    Ordered and independently interpreted AVAILABLE laboratory tests. Please see results section in chart for full details.  Results for orders placed during the hospital encounter of 02/22/13   COMPREHENSIVE METABOLIC PANEL       Component Value Range    Glucose 161 (*) 70 - 100 mg/dL    BUN 20  7 - 21 mg/dL    Creatinine 0.8  0.6 - 1.0 mg/dL    Sodium 161  096 - 045    Potassium 4.3  3.5 - 5.1    Chloride 100  98 - 107    CO2 25  22 - 29    CALCIUM 8.8  7.9 - 10.6 mg/dL    Protein, Total 7.2  6.0 - 8.3 g/dL    Albumin 2.7 (*) 3.5 - 5.0 g/dL    AST (SGOT) 23  5 - 34 U/L    ALT 14  0 - 55 U/L    Alkaline Phosphatase 208 (*) 40 - 150 U/L    Bilirubin, Total 0.3  0.2 - 1.2 mg/dL    Globulin 4.5 (*) 2.0 - 3.6 g/dL     Albumin/Globulin Ratio 0.6 (*) 0.9 - 2.2   CBC AND DIFFERENTIAL       Component Value Range    WBC 12.78 (*) 3.50 - 10.80    RBC 3.05 (*) 4.20 - 5.40    Hgb 9.2 (*) 12.0 - 16.0 g/dL    Hematocrit 40.9 (*) 37.0 - 47.0 %    MCV 96.7  80.0 - 100.0 fL    MCH  30.2  28.0 - 32.0 pg    MCHC 31.2 (*) 32.0 - 36.0 g/dL    RDW 16 (*) 12 - 15 %    Platelets 454 (*) 140 - 400    MPV 9.3 (*) 9.4 - 12.3 fL    Neutrophils 80  None %    Lymphocytes Automated 7  None %    Monocytes 12  None %    Eosinophils Automated 0  None %    Basophils Automated 0  None %    Immature Granulocyte 1  None %    Nucleated RBC 0  0 - 1    Neutrophils Absolute 10.29 (*) 1.80 - 8.10    Abs Lymph Automated 0.88  0.50 - 4.40    Abs Mono Automated 1.53 (*) 0.00 - 1.20    Abs Eos Automated 0.05  0.00 - 0.70    Absolute Baso Automated 0.03  0.00 - 0.20    Absolute Immature Granulocyte 0.07 (*) 0   CK       Component Value Range    Creatine Kinase (CK) 42  29 - 168 U/L   TROPONIN I       Component Value Range    Troponin I 0.02  0.00 - 0.09 ng/mL   PT AND APTT       Component Value Range    PT 13.9  12.6 - 15.0    PT INR 1.1  0.9 - 1.1    PT Anticoag. Given Within 48 hrs. None      PTT 36  23 - 37   GFR       Component Value Range    EGFR >60.0     GLUCOSE WHOLE BLOOD - POCT       Component Value Range    POCT - Glucose Whole blood 145 (*) 70 - 100 mg/dL   GLUCOSE WHOLE BLOOD - POCT       Component Value Range    POCT - Glucose Whole blood 226 (*) 70 - 100 mg/dL   GLUCOSE WHOLE BLOOD - POCT       Component Value Range    POCT - Glucose Whole blood 230 (*) 70 - 100 mg/dL       CONSULTATIONS        CRITICAL CARE/PROCEDURES      DIAGNOSIS      Diagnosis:  Final diagnoses:   Hypoglycemia   Anemia       Disposition:  ED Disposition     Discharge/Return to Nursing Home           Prescriptions:  Patient's Medications   New Prescriptions    No medications on file   Previous Medications    AMLODIPINE (NORVASC) 5 MG TABLET    Take 5 mg by mouth daily.    CALCIUM-VITAMIN  D (OSCAL-500) 500-200 MG-UNIT PER TABLET    Take 1 tablet by mouth daily.    FUROSEMIDE (LASIX) 20 MG TABLET    Take 20 mg by mouth daily.     GABAPENTIN (NEURONTIN) 600 MG TABLET    Take 600 mg by mouth 2 (two) times daily.    INSULIN ASPART (NOVOLOG) 100 UNIT/ML INJECTION    Inject 1-3 Units into the skin 3 (three) times daily before meals.    INSULIN GLARGINE (LANTUS) 100 UNIT/ML INJECTION    Inject 15 Units into the skin nightly.    LISINOPRIL (PRINIVIL,ZESTRIL) 2.5 MG TABLET    Take 1 tablet (2.5 mg total)  by mouth daily.    MAGNESIUM OXIDE (MAG-OX) 400 MG TABLET    Take 400 mg by mouth daily.    METHOTREXATE (RHEUMATREX) 2.5 MG TABLET    Take 7.5 mg by mouth every mon, wed and fri.    NYSTATIN (MYCOSTATIN) 100000 UNIT/ML SUSPENSION    Take 4 mLs (400,000 Units total) by mouth 4 (four) times daily.    PANTOPRAZOLE (PROTONIX) 40 MG TABLET    Take 40 mg by mouth daily.    PREDNISONE (DELTASONE) 5 MG TABLET    Take 5 mg by mouth daily.    SENNA-DOCUSATE (PERICOLACE) 8.6-50 MG PER TABLET    Take 1 tablet by mouth daily.    SUCRALFATE (CARAFATE) 1 G TABLET    Take 1 g by mouth daily.    VITAMIN B-12 (CYANOCOBALAMIN) 500 MCG TABLET    Take 500 mcg by mouth daily.   Modified Medications    No medications on file   Discontinued Medications    No medications on file           Loetta Rough, DO  02/22/13 1547

## 2013-02-22 NOTE — ED Notes (Signed)
"  Per was found by nursing home staff with mental status changes, Per EMS blood sugar of 30, Dextrose given on rout to the ER." Patient is alert oriented times 2 skin warm dry on arrival into the ER

## 2013-02-22 NOTE — Discharge Instructions (Signed)
Altered Mental Status, Resolved    You have been evaluated for "Altered Mental Status."    Altered Mental Status is the medical term for a change in the way a person normally thinks. Symptoms of altered mental status may include confusion, amnesia (memory loss), lethargy, or agitation.    There are many causes of altered mental status. The following list includes some of the more common causes:   Infection (often urine and lung infections).   Stroke, transient ischemic attacks (TIAs or mini-strokes).   Fever.   Reaction to medications (this may happen if too much pain medication or sedative medication is taken).   There may be other causes as well.    Your symptoms have improved or resolved and after an evaluation, the physician caring for you believes that it is safe for you to be discharged. You should arrange for a responsible person to be available should you become more ill or require assistance. Please alert the staff if you feel you will be unsafe in your home environment tonight!    YOU SHOULD SEEK MEDICAL ATTENTION IMMEDIATELY, EITHER HERE OR AT THE NEAREST EMERGENCY DEPARTMENT, IF ANY OF THE FOLLOWING OCCURS:   Confusion, coma, agitation.   Fever, vomiting.   Severe headache.   Signs of stroke, weakness.        Hypoglycemia    You were seen for low blood sugar (hypoglycemia).    If you take insulin or another medication for diabetes, your low blood sugar was probably caused by one of the following:   Taking your insulin or diabetes pills and not eating an adequate meal on time. This can cause the blood sugar to drop rapidly. Sometimes it falls fast enough that you may not recognize the problem until it is too late. Make sure to eat regular meals after taking your medications.   Taking too much insulin or diabetes medication. Your insulin requirements may change over time and the normal dose can be too much. If your blood sugar readings at home are too low at times, you should discuss this  with your doctor so your dosing can be adjusted.    Low blood sugar can cause lightheadedness, sweating or passing out. Keep a snack food that contains sugar with you at all times. If you begin to have these symptoms, eat the snack food to see if the symptoms go away. NEVER drive a car when you are feeling this way.    If you are diabetic, you should check your blood sugar every 4 hours for the next 24 hours to be sure it is not getting too low.    Do not be alone for the next 24 hours. Be sure someone else is with you to make certain that you are okay and that you don t have another episode of low blood sugar.    Be sure to notify your doctor of your low blood sugar. Further testing may be needed. Make sure that your doctor knows all of the medications that you are taking (both prescription and over the counter).    YOU SHOULD SEEK MEDICAL ATTENTION IMMEDIATELY, EITHER HERE OR AT THE NEAREST EMERGENCY DEPARTMENT, IF ANY OF THE FOLLOWING OCCURS:   You develop another episode that seems like low blood sugar (with symptoms of lightheadedness, shaking, nausea, etc.).   You have any fainting (passing out) spells.   You have taken any diabetic medication (insulin or pills) and you are unable to eat due to vomiting or nausea.

## 2013-02-23 LAB — ECG 12-LEAD
Atrial Rate: 71 {beats}/min
P Axis: 32 degrees
P-R Interval: 154 ms
Q-T Interval: 412 ms
QRS Duration: 88 ms
QTC Calculation (Bezet): 447 ms
R Axis: -28 degrees
T Axis: 12 degrees
Ventricular Rate: 71 {beats}/min

## 2013-06-15 ENCOUNTER — Encounter (INDEPENDENT_AMBULATORY_CARE_PROVIDER_SITE_OTHER): Payer: Self-pay | Admitting: Internal Medicine

## 2013-06-29 ENCOUNTER — Other Ambulatory Visit (HOSPITAL_COMMUNITY): Payer: Self-pay | Admitting: Urology

## 2013-06-29 DIAGNOSIS — R32 Unspecified urinary incontinence: Secondary | ICD-10-CM

## 2013-06-29 DIAGNOSIS — N390 Urinary tract infection, site not specified: Secondary | ICD-10-CM

## 2013-07-02 ENCOUNTER — Ambulatory Visit (HOSPITAL_COMMUNITY)
Admission: RE | Admit: 2013-07-02 | Discharge: 2013-07-02 | Disposition: A | Discharge status: Home / Self Care | Payer: Medicare Other | Source: Ambulatory Visit | Attending: Urology | Admitting: Urology

## 2013-07-02 DIAGNOSIS — R32 Unspecified urinary incontinence: Secondary | ICD-10-CM

## 2013-07-02 DIAGNOSIS — N390 Urinary tract infection, site not specified: Secondary | ICD-10-CM | POA: Insufficient documentation

## 2013-07-02 DIAGNOSIS — N269 Renal sclerosis, unspecified: Secondary | ICD-10-CM | POA: Insufficient documentation

## 2013-07-13 ENCOUNTER — Emergency Department (HOSPITAL_COMMUNITY)
Admission: EM | Admit: 2013-07-13 | Discharge: 2013-07-13 | Disposition: A | Discharge status: Home / Self Care | Payer: Medicare Other | Attending: Emergency Medicine | Admitting: Emergency Medicine

## 2013-07-13 ENCOUNTER — Encounter (HOSPITAL_COMMUNITY): Payer: Self-pay | Admitting: Emergency Medicine

## 2013-07-13 ENCOUNTER — Emergency Department (HOSPITAL_COMMUNITY): Payer: Medicare Other

## 2013-07-13 DIAGNOSIS — E119 Type 2 diabetes mellitus without complications: Secondary | ICD-10-CM | POA: Insufficient documentation

## 2013-07-13 DIAGNOSIS — M542 Cervicalgia: Secondary | ICD-10-CM

## 2013-07-13 DIAGNOSIS — F0390 Unspecified dementia without behavioral disturbance: Secondary | ICD-10-CM | POA: Insufficient documentation

## 2013-07-13 DIAGNOSIS — I10 Essential (primary) hypertension: Secondary | ICD-10-CM | POA: Insufficient documentation

## 2013-07-13 DIAGNOSIS — Z79899 Other long term (current) drug therapy: Secondary | ICD-10-CM | POA: Insufficient documentation

## 2013-07-13 DIAGNOSIS — M129 Arthropathy, unspecified: Secondary | ICD-10-CM | POA: Insufficient documentation

## 2013-07-13 DIAGNOSIS — Z7982 Long term (current) use of aspirin: Secondary | ICD-10-CM | POA: Insufficient documentation

## 2013-07-13 DIAGNOSIS — Z794 Long term (current) use of insulin: Secondary | ICD-10-CM | POA: Insufficient documentation

## 2013-07-13 HISTORY — DX: Unspecified osteoarthritis, unspecified site: M19.90

## 2013-07-13 HISTORY — DX: Type 2 diabetes mellitus without complications: E11.9

## 2013-07-13 HISTORY — DX: Essential (primary) hypertension: I10

## 2013-07-13 NOTE — ED Notes (Signed)
[facility] on right side of neck. Pt had a CT without contrast (results in pt's box at nursing station), sent here to have further tests.

## 2013-07-13 NOTE — Discharge Instructions (Signed)
Tests show no aneurysm in your neck.  Followup your primary care Dr.

## 2013-07-13 NOTE — ED Notes (Signed)
Pt presents with mass to right side of neck which she first noticed 3 months ago. Pt was seen by PCP and had CT done. Pt was told to come here for further evaluation. Pt reports pain when she turns her neck.

## 2013-07-13 NOTE — ED Provider Notes (Signed)
CSN: [id number]     Arrival date & time 07/13/13  1418 History   This chart was scribed for Donnetta Hutching, MD by Manuela Schwartz, ED scribe. This patient was seen in room APA08/APA08 and the patient's care was started at 1418.  Chief Complaint  Patient presents with  . Mass    rt side of neck   Level 5 Caveat to pt's mild dementia  The history is provided by the patient. No language interpreter was used.   HPI Comments: Melanie Stewart is a 80 y.o. female who presents to the Emergency Department from [facility] for follow up to have an ultrasound of her right carotid artery after [facility] ordered an imaging study which was performed on 07/03/13 for a non-contrast neck CT for concern of an aneurysm. She had a [place] study of her neck that was performed at her home prior to the CT. Pt reports having discomfort from her right lower lateral neck up to her right ear, she states pain is worse w/movements of her neck. She reports weight loss recently secondary to having dental work performed recently. She ambulates short distances from house to her car  Allergic to Iodine  Hx RA  Past Medical History  Diagnosis Date  . Diabetes mellitus without complication   . Arthritis   . Hypertension    Past Surgical History  Procedure Laterality Date  . Joint replacement      bilateral hip   History reviewed. No pertinent family history. History  Substance Use Topics  . Smoking status: Never Smoker   . Smokeless tobacco: Not on file  . Alcohol Use: No   OB History   Grav Para Term Preterm Abortions TAB SAB Ect Mult Living                 Review of Systems  Unable to perform ROS: Dementia  Constitutional: Negative for fever and chills.  Respiratory: Negative for cough and shortness of breath.   Cardiovascular: Negative for chest pain.  Gastrointestinal: Negative for nausea and vomiting.  Musculoskeletal: Negative for back pain.       Right lateral neck discomfort  Skin:  Negative for rash.    Allergies  Iodine  Home Medications   Current Outpatient Rx  Name  Route  Sig  Dispense  Refill  . amLODipine (NORVASC) 5 MG tablet   Oral   Take 5 mg by mouth every morning.          Marland Kitchen aspirin EC 81 MG tablet   Oral   Take 81 mg by mouth every morning.          . Cyanocobalamin (B-12) 500 MCG TABS   Oral   Take 1 tablet by mouth every morning.          . folic acid (FOLVITE) 1 MG tablet   Oral   Take 1 mg by mouth every morning.          . gabapentin (NEURONTIN) 600 MG tablet   Oral   Take 600 mg by mouth 2 (two) times daily.         Marland Kitchen glipiZIDE (GLUCOTROL) 5 MG tablet   Oral   Take 5 mg by mouth every morning.          . hydroxychloroquine (PLAQUENIL) 200 MG tablet   Oral   Take 400 mg by mouth daily.         . insulin detemir (LEVEMIR) 100 UNIT/ML injection   Subcutaneous  Inject 15 Units into the skin at bedtime.         . insulin regular (NOVOLIN R,HUMULIN R) 100 units/mL injection   Subcutaneous   Inject 10 Units into the skin 3 (three) times daily before meals. To use as directed per sliding scale         . Magnesium 250 MG TABS   Oral   Take 250 mg by mouth every morning.          . methotrexate (RHEUMATREX) 2.5 MG tablet   Oral   Take 5 mg by mouth every Thursday. Caution:Chemotherapy. Protect from light.         . pantoprazole (PROTONIX) 40 MG tablet   Oral   Take 40 mg by mouth daily.         . predniSONE (DELTASONE) 5 MG tablet   Oral   Take 5 mg by mouth daily.         . solifenacin (VESICARE) 5 MG tablet   Oral   Take 5 mg by mouth daily. Patient started this course on 3/92015 and discontinued on 07/10/2013 due to side effects per family member         . sucralfate (CARAFATE) 1 G tablet   Oral   Take 1 g by mouth every morning.          . sulfamethoxazole-trimethoprim (BACTRIM DS,SEPTRA DS) 800-160 MG per tablet   Oral   Take 1 tablet by mouth 2 (two) times daily. Patient started  this course on 3/92015 and discontinued on 07/10/2013 due to side effects per family member           Triage Vitals: BP 181/71  Pulse 75  Temp(Src) 98.2 F (36.8 C) (Oral)  Resp 21  SpO2 100%  Physical Exam  Nursing note and vitals reviewed. Constitutional: She is oriented to person, place, and time. She appears well-developed and well-nourished. No distress.  HENT:  Head: Normocephalic and atraumatic.  Eyes: Conjunctivae are normal. Right eye exhibits no discharge. Left eye exhibits no discharge.  Neck: Normal range of motion.  Cardiovascular: Normal rate.   Pulmonary/Chest: Effort normal. No respiratory distress.  Musculoskeletal: Normal range of motion. She exhibits no edema.  Neurological: She is alert and oriented to person, place, and time.  Skin: Skin is warm and dry.  Psychiatric: She has a normal mood and affect. Thought content normal.    ED Course  Procedures (including critical care time) DIAGNOSTIC STUDIES: Oxygen Saturation is 100% on room air, normal by my interpretation.    COORDINATION OF CARE: At 335 PM Discussed treatment plan with patient which includes US carotid duplex right, EKG. Patient agrees.   Labs Review Labs Reviewed - No data to display Imaging Review [place] Carotid Duplex Right  07/13/2013   CLINICAL DATA:  Palpable pulsatile right neck mass.  EXAM: BILATERAL CAROTID DUPLEX ULTRASOUND  TECHNIQUE: Wallace CullensGray scale imaging, color Doppler and duplex ultrasound were performed of bilateral carotid and vertebral arteries in the neck.  COMPARISON:  None.  FINDINGS: Criteria: Quantification of carotid stenosis is based on velocity parameters that correlate the residual internal carotid diameter with NASCET-based stenosis levels, using the diameter of the distal internal carotid lumen as the denominator for stenosis measurement.  The following velocity measurements were obtained:  RIGHT  ICA:  116/29 cm/sec  CCA:  92/18 cm/sec  SYSTOLIC ICA/CCA RATIO:  0.9   DIASTOLIC ICA/CCA RATIO:  1.1  ECA:  123 cm/sec  LEFT  ICA:  71/18 cm/sec  CCA:  42/7 cm/sec  SYSTOLIC ICA/CCA RATIO:  1.7  DIASTOLIC ICA/CCA RATIO:  2.4  ECA:  33 cm/sec  RIGHT CAROTID ARTERY: The right common carotid artery in the neck is very tortuous, which may account for the palpable pulsatile area. This segment of the carotid shows normal patency without aneurysm. Mild to moderate amount of partially calcified plaque is present at the level of the distal common carotid artery and proximal internal carotid artery. Velocities and waveforms are within normal limits. Estimated right ICA stenosis is less than 50%.  RIGHT VERTEBRAL ARTERY: Antegrade flow with normal waveform and velocity.  LEFT CAROTID ARTERY: A mild amount of partially calcified plaque is present at the level of the distal common carotid artery, carotid bulb and proximal ICA. Estimated left ICA stenosis is less than 50%.  LEFT VERTEBRAL ARTERY: Antegrade flow with normal waveform and velocity.  IMPRESSION: Atherosclerotic plaque at both carotid bifurcations without significant carotid stenosis identified. Estimated bilateral ICA stenoses are less than 50%. The palpable abnormality in the right neck may correspond to a very tortuous common carotid artery. There is no evidence of carotid aneurysm in the neck.   Electronically Signed   By: Irish Lack M.D.   On: 07/13/2013 16:48     EKG Interpretation   Date/Time:  Monday July 13 2013 14:50:17 EDT Ventricular Rate:  71 PR Interval:  158 QRS Duration: 92 QT Interval:  410 QTC Calculation: 445 R Axis:   -25 Text Interpretation:  Normal sinus rhythm with sinus arrhythmia Possible  Left atrial enlargement Septal infarct , age undetermined Possible Lateral  infarct , age undetermined Abnormal ECG No previous ECGs available  Confirmed by Isahia Hollerbach  MD, Sharod Petsch ([id number]) on 07/13/2013 3:39:48 PM      MDM   Final diagnoses:  Neck pain on right side    Doppler study shows no significant  carotid stenosis. No aneurysmal dilatation noted. Patient has primary care followup     I personally performed the services described in this documentation, which was scribed in my presence. The recorded information has been reviewed and is accurate.      Donnetta Hutching, MD 07/13/13 [id number]

## 2014-04-07 ENCOUNTER — Inpatient Hospital Stay: Payer: Self-pay | Admitting: Internal Medicine

## 2014-04-07 LAB — COMPREHENSIVE METABOLIC PANEL
AST: 73 U/L — AB (ref 15–37)
Albumin: 3 g/dL — ABNORMAL LOW (ref 3.4–5.0)
Alkaline Phosphatase: 167 U/L — ABNORMAL HIGH
Anion Gap: 8 (ref 7–16)
BILIRUBIN TOTAL: 0.3 mg/dL (ref 0.2–1.0)
BUN: 30 mg/dL — ABNORMAL HIGH (ref 7–18)
CALCIUM: 8.5 mg/dL (ref 8.5–10.1)
CHLORIDE: 109 mmol/L — AB (ref 98–107)
Co2: 26 mmol/L (ref 21–32)
Creatinine: 1.39 mg/dL — ABNORMAL HIGH (ref 0.60–1.30)
EGFR (African American): 45 — ABNORMAL LOW
EGFR (Non-African Amer.): 37 — ABNORMAL LOW
Glucose: 97 mg/dL (ref 65–99)
Osmolality: 291 (ref 275–301)
Potassium: 4.1 mmol/L (ref 3.5–5.1)
SGPT (ALT): 64 U/L — ABNORMAL HIGH
Sodium: 143 mmol/L (ref 136–145)
Total Protein: 7.1 g/dL (ref 6.4–8.2)

## 2014-04-07 LAB — URINALYSIS, COMPLETE
Bacteria: NONE SEEN
Bilirubin,UR: NEGATIVE
Blood: NEGATIVE
GLUCOSE, UR: NEGATIVE mg/dL (ref 0–75)
Ketone: NEGATIVE
Leukocyte Esterase: NEGATIVE
Nitrite: NEGATIVE
Ph: 6 (ref 4.5–8.0)
Protein: NEGATIVE
RBC,UR: <1 /HPF (ref 0–5)
Specific Gravity: 1.015 (ref 1.003–1.030)
WBC UR: <1 /HPF (ref 0–5)

## 2014-04-07 LAB — CBC WITH DIFFERENTIAL/PLATELET
Basophil #: 0.1 10*3/uL (ref 0.0–0.1)
Basophil %: 0.3 %
EOS ABS: 0.1 10*3/uL (ref 0.0–0.7)
Eosinophil %: 0.3 %
HCT: 27.4 % — ABNORMAL LOW (ref 35.0–47.0)
HGB: 8.6 g/dL — ABNORMAL LOW (ref 12.0–16.0)
Lymphocyte #: 0.4 10*3/uL — ABNORMAL LOW (ref 1.0–3.6)
Lymphocyte %: 2.6 %
MCH: 30.7 pg (ref 26.0–34.0)
MCHC: 31.4 g/dL — ABNORMAL LOW (ref 32.0–36.0)
MCV: 98 fL (ref 80–100)
MONOS PCT: 8.7 %
Monocyte #: 1.5 x10 3/mm — ABNORMAL HIGH (ref 0.2–0.9)
Neutrophil #: 15.4 10*3/uL — ABNORMAL HIGH (ref 1.4–6.5)
Neutrophil %: 88.1 %
Platelet: 188 10*3/uL (ref 150–440)
RBC: 2.8 10*6/uL — ABNORMAL LOW (ref 3.80–5.20)
RDW: 17.7 % — ABNORMAL HIGH (ref 11.5–14.5)
WBC: 17.5 10*3/uL — ABNORMAL HIGH (ref 3.6–11.0)

## 2014-04-07 LAB — TROPONIN I

## 2014-04-07 LAB — LIPASE, BLOOD: LIPASE: 209 U/L (ref 73–393)

## 2014-04-08 LAB — BASIC METABOLIC PANEL
ANION GAP: 8 (ref 7–16)
BUN: 26 mg/dL — ABNORMAL HIGH (ref 7–18)
CHLORIDE: 110 mmol/L — AB (ref 98–107)
Calcium, Total: 8.4 mg/dL — ABNORMAL LOW (ref 8.5–10.1)
Co2: 26 mmol/L (ref 21–32)
Creatinine: 1.37 mg/dL — ABNORMAL HIGH (ref 0.60–1.30)
EGFR (Non-African Amer.): 38 — ABNORMAL LOW
GFR CALC AF AMER: 46 — AB
GLUCOSE: 72 mg/dL (ref 65–99)
OSMOLALITY: 290 (ref 275–301)
Potassium: 4.1 mmol/L (ref 3.5–5.1)
SODIUM: 144 mmol/L (ref 136–145)

## 2014-04-08 LAB — CBC WITH DIFFERENTIAL/PLATELET
Basophil #: 0 10*3/uL (ref 0.0–0.1)
Basophil %: 0.2 %
Eosinophil #: 0.4 10*3/uL (ref 0.0–0.7)
Eosinophil %: 3 %
HCT: 26.6 % — ABNORMAL LOW (ref 35.0–47.0)
HGB: 8.3 g/dL — AB (ref 12.0–16.0)
LYMPHS PCT: 7.7 %
Lymphocyte #: 1 10*3/uL (ref 1.0–3.6)
MCH: 30.5 pg (ref 26.0–34.0)
MCHC: 31.2 g/dL — ABNORMAL LOW (ref 32.0–36.0)
MCV: 98 fL (ref 80–100)
Monocyte #: 1.4 x10 3/mm — ABNORMAL HIGH (ref 0.2–0.9)
Monocyte %: 11.2 %
NEUTROS ABS: 9.6 10*3/uL — AB (ref 1.4–6.5)
Neutrophil %: 77.9 %
Platelet: 187 10*3/uL (ref 150–440)
RBC: 2.72 10*6/uL — AB (ref 3.80–5.20)
RDW: 18.3 % — AB (ref 11.5–14.5)
WBC: 12.3 10*3/uL — AB (ref 3.6–11.0)

## 2014-04-08 LAB — RAPID INFLUENZA A&B ANTIGENS

## 2014-04-09 LAB — URINE CULTURE

## 2014-04-12 LAB — CULTURE, BLOOD (SINGLE)

## 2014-05-05 ENCOUNTER — Encounter: Payer: Self-pay | Admitting: Gastroenterology

## 2014-05-18 ENCOUNTER — Encounter: Payer: Self-pay | Admitting: Gastroenterology

## 2014-05-18 ENCOUNTER — Ambulatory Visit (INDEPENDENT_AMBULATORY_CARE_PROVIDER_SITE_OTHER): Payer: Medicare Other | Admitting: Gastroenterology

## 2014-05-18 VITALS — BP 119/66 | HR 86 | Temp 97.7°F | Ht 69.0 in | Wt 127.6 lb

## 2014-05-18 DIAGNOSIS — R112 Nausea with vomiting, unspecified: Secondary | ICD-10-CM | POA: Insufficient documentation

## 2014-05-18 MED ORDER — ONDANSETRON HCL 4 MG PO TABS: 4.0000 mg | ORAL_TABLET | Freq: Three times a day (TID) | ORAL | Status: AC

## 2014-05-18 NOTE — Patient Instructions (Signed)
Continue Zofran with your meals three times a day.   Use the boost or ensure 2-3 times per day in supplement.  We are trying to help with assistance where you are to help with meals.   We will see you back in 4 weeks.

## 2014-05-19 ENCOUNTER — Encounter: Payer: Self-pay | Admitting: Gastroenterology
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
## 2014-05-19 NOTE — Progress Notes (Signed)
Primary Care Physician:  Kristeen MissWILLIS, DAVID FIELDING, MD Primary Gastroenterologist:  Dr. Darrick PennaFields   Chief Complaint  Patient presents with  . Nausea    states is all cleared up now.  . Emesis    HPI:   Melanie Stewart is a 81 y.o. female presenting today at the request of Dr. Anne HahnWillis secondary to nausea and vomiting. Quite pleasant, alert and oriented. Notes that N/V has resolved since referral. Reports N/V started around Nov 2014, when she moved down here from [place], [place]. States her niece, Synetta Failnita brought her to live with the family. Denies abdominal pain, vomiting. States she was given a nausea medication by her PCP (Zofran) to take with meals, and this has brought resolution to her symptoms. Denies dysphagia. Prior to starting Zofran, notes vomiting after eating and early satiety. Notes that she weighed 140-150 in 2014, now weight is 127 per our scales.   After several minutes of discussion, patient confided in me that she was only eating one to 2 meals a day. States she used to have lunch items set out for her to eat by her niece, but now that doesn't happen any longer. States she believes her niece just got tired of "leaving it out". Lives with her niece. States she sold her house and possessions to provide money for living space, but it was "not what we had talked about". States her money was used to add on a deck, new roof, and only a small attached living area for her. Misses her friends and desires to go back to [place]. Denies feeling unsafe, denies physical abuse. However, she does note that no one talks to her, and she is lonely. She said she was told she could not have help during the day, because her niece did not want anyone in the house.   Past Medical History  Diagnosis Date  . Diabetes mellitus without complication   . Arthritis   . Hypertension   . Hypercholesterolemia   . GERD (gastroesophageal reflux disease)     Past Surgical History  Procedure Laterality Date  .  Joint replacement      bilateral hip  . Abdominal hysterectomy      Current Outpatient Prescriptions  Medication Sig Dispense Refill  . amLODipine (NORVASC) 5 MG tablet Take 5 mg by mouth every morning.     Marland Kitchen. aspirin EC 81 MG tablet Take 81 mg by mouth every morning.     . Cyanocobalamin (B-12) 500 MCG TABS Take 1 tablet by mouth every morning.     . folic acid (FOLVITE) 1 MG tablet Take 1 mg by mouth every morning.     Marland Kitchen. glipiZIDE (GLUCOTROL) 5 MG tablet Take 5 mg by mouth every morning.     . hydroxychloroquine (PLAQUENIL) 200 MG tablet Take 400 mg by mouth daily.    . insulin detemir (LEVEMIR) 100 UNIT/ML injection Inject 15 Units into the skin at bedtime.    . insulin regular (NOVOLIN R,HUMULIN R) 100 units/mL injection Inject 10 Units into the skin 3 (three) times daily before meals. To use as directed per sliding scale    . Magnesium 250 MG TABS Take 250 mg by mouth every morning.     . methotrexate (RHEUMATREX) 2.5 MG tablet Take 5 mg by mouth every Thursday. Caution:Chemotherapy. Protect from light.    . pantoprazole (PROTONIX) 40 MG tablet Take 40 mg by mouth daily.    . predniSONE (DELTASONE) 5 MG tablet Take 5 mg by mouth daily.    .Marland Kitchen  sucralfate (CARAFATE) 1 G tablet Take 1 g by mouth every morning.     . gabapentin (NEURONTIN) 600 MG tablet Take 600 mg by mouth 2 (two) times daily.    . ondansetron (ZOFRAN) 4 MG tablet Take 1 tablet (4 mg total) by mouth 3 (three) times daily with meals. 90 tablet 3  . solifenacin (VESICARE) 5 MG tablet Take 5 mg by mouth daily. Patient started this course on 3/92015 and discontinued on 07/10/2013 due to side effects per family member    . sulfamethoxazole-trimethoprim (BACTRIM DS,SEPTRA DS) 800-160 MG per tablet Take 1 tablet by mouth 2 (two) times daily. Patient started this course on 3/92015 and discontinued on 07/10/2013 due to side effects per family member     No current facility-administered medications for this visit.    Allergies as of  05/18/2014 - Review Complete 05/18/2014  Allergen Reaction Noted  . Iodine Shortness Of Breath 07/13/2013    Family History  Problem Relation Age of Onset  . Colon cancer Neg Hx     History   Social History  . Marital Status: Widowed    Spouse Name: N/A    Number of Children: N/A  . Years of Education: N/A   Occupational History  . Not on file.   Social History Main Topics  . Smoking status: Never Smoker   . Smokeless tobacco: Not on file  . Alcohol Use: No  . Drug Use: No  . Sexual Activity: Not on file   Other Topics Concern  . Not on file   Social History Narrative    Review of Systems: As mentioned in HPI.   Physical Exam: BP 119/66 mmHg  Pulse 86  Temp(Src) 97.7 F (36.5 C)  Ht  (1.753 m)  Wt 127 lb 9.6 oz (57.879 kg)  BMI 18.83 kg/m2 General:   Alert and oriented. Pleasant and cooperative.  Head:  Normocephalic and atraumatic. Eyes:  Without icterus, sclera clear and conjunctiva pink.  Ears:  Normal auditory acuity. Nose:  No deformity, discharge,  or lesions. Neck:  Supple, without mass or thyromegaly. Lungs:  Clear to auscultation bilaterally. No wheezes, rales, or rhonchi. No distress.  Heart:  S1, S2 present without murmurs appreciated.  Abdomen:  Limited exam, sitting up in wheelchair, +BS, soft, non-tender and non-distended.  Rectal:  Deferred  Msk:  Mild kyphosis Extremities:  , LLE with mild swelling, chronic.  Neurologic:  Alert and  oriented x4 Psych:  Alert and cooperative. Normal mood and affect.

## 2014-05-20 NOTE — Progress Notes (Signed)
cc'ed to pcp °

## 2014-05-26 NOTE — Progress Notes (Signed)
I called [facility] and spoke with Micheal LikensFlorence Bereen at  [phone].  I transferred the call to Gerrit HallsAnna Sams, ANP to answer investigative questions.  Routing to [employer] Sams

## 2014-06-21 ENCOUNTER — Ambulatory Visit: Payer: Medicare Other | Admitting: Gastroenterology

## 2014-06-21 ENCOUNTER — Telehealth: Payer: Self-pay | Admitting: Gastroenterology

## 2014-06-21 ENCOUNTER — Encounter: Payer: Self-pay | Admitting: Gastroenterology
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
## 2014-06-21 NOTE — Telephone Encounter (Signed)
Pt was a no show

## 2014-06-21 NOTE — Telephone Encounter (Signed)
I have [facility] pt to come see AS on 3/14 at 11am and mailed appt card/letter
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
## 2014-06-28 NOTE — Telephone Encounter (Signed)
Attempted to speak with Melanie Stewart. Left message.
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
## 2014-07-12 ENCOUNTER — Ambulatory Visit: Payer: Medicare Other | Admitting: Gastroenterology

## 2014-08-11 ENCOUNTER — Ambulatory Visit: Payer: Medicare Other | Admitting: Gastroenterology

## 2014-08-11 ENCOUNTER — Encounter: Payer: Self-pay | Admitting: Gastroenterology

## 2014-08-11 ENCOUNTER — Telehealth: Payer: Self-pay | Admitting: Gastroenterology

## 2014-08-11 NOTE — Telephone Encounter (Signed)
PATIENT WAS A NO SHOW 08/11/14 AND LETTER WAS SENT °

## 2014-08-21 NOTE — H&P (Signed)
PATIENT NAME:  Melanie Stewart, Melanie Stewart MR#:  [id number] DATE OF BIRTH:  1916/04/10  DATE OF ADMISSION:  04/07/2014  PRIMARY CARE PROVIDER: Eliseo Gum, MD  EMERGENCY DEPARTMENT REFERRING PHYSICIAN: Cecille Amsterdam. Mayford Knife, MD  CHIEF COMPLAINT: Fever, nausea and vomiting, and hypotension.   HISTORY OF PRESENT ILLNESS: The patient is a pleasant 81 year old African American female who has a history of GERD, hypertension, diabetes, osteoarthritis, history of angina, who reports that earlier this morning, she started having nausea, vomiting and was not feeling well. Her abdomen felt like it was not right. She did not have any significant abdominal pain. She did have some diarrhea, and also started feeling feverish. She went to the urgent care at [facility], and was noted to have an elevated temperature. Blood pressure was low, so referred here. The patient, because of her symptoms, had a CT of the abdomen and pelvis which showed limited scan, but distended gallbladder. Appendix, gallbladder not identified. Diverticulosis in the colon and diffuse lumbar compression fracture were noted, but no other abnormality was noted. Abdominal x-ray was negative as well, and chest x-ray was nonrevealing. Her WBC count was noted at 17.5. The patient otherwise denies any chest pains or palpitations or shortness of breath. She was given a sandwich tray, which she is currently eating now. The patient does complain of urinary incontinence and urgency.   PAST MEDICAL HISTORY SIGNIFICANT FOR:  1.  GERD.  2.  Hypertension.  3.  Diabetes type 2.  4.  Osteoarthritis.  5.  History of angina.  6.  ALLERGY TO IODINE.   MEDICATIONS: Amlodipine 5 mg p.o. daily, aspirin 81 mg 1 tablet p.o. daily, Vitamin B complex daily, Zyrtec 10 mg daily, folic acid 1 mg daily, gabapentin 600 mg 1 tablets p.o. t.i.d., glipizide 5 mg daily, hydrochloroquine 200 mg 2 tablets daily, insulin NovoLog 3 times a day prior to meals, Levemir 1 dose prior  bedtime (dose is not known), magnesium on 250 mg 1 tablet p.o. daily, methotrexate 2.5 mg take 2 tablets by mouth once a week, Protonix 40 daily, prednisone 5 mg daily, sucralfate 1 g 4 times a day.   SOCIAL HISTORY: Does not smoke. Does not drink. No drugs.   FAMILY HISTORY: Positive for hypertension.   SOCIAL HISTORY: The patient lives with her niece.   PAST SURGICAL HISTORY: Bilateral hip replacement, partial hysterectomy, and cholecystectomy.   REVIEW OF SYSTEMS:  CONSTITUTIONAL: Complains of fevers. Some fatigue and weakness. No weight loss. No weight gain.  EYES: No blurred or double vision. No redness. No inflammation.  EARS, NOSE, AND THROAT: No tinnitus. No ear pain. Has some hearing loss. No difficulty swallowing.  RESPIRATORY: Denies any cough, wheezing, hemoptysis. No chronic obstructive pulmonary disease. No TB. CARDIOVASCULAR: Denies any chest pain, orthopnea, edema, or arrhythmia.  GASTROINTESTINAL: Complains of some nausea, vomiting, diarrhea. No abdominal pain. No hematemesis. No melena.  GENITOURINARY: Complaints of urinary incontinence.  ENDOCRINOLOGIC: Denies any polyuria, nocturia or thyroid problems.  HEMATOLOGIC AND LYMPHATIC: Denies anemia, easy bruisability or bleeding.  SKIN: No acne. No rash.  MUSCULOSKELETAL: Has osteoarthritis-related pain. No gout.  NEUROLOGIC: No numbness, CVA, or TIA.  PSYCHIATRIC: No anxiety, insomnia, or ADD.   PHYSICAL EXAMINATION:  VITAL SIGNS: Temperature 100.7, pulse 88, respirations 18, blood pressure 88/66, oxygen saturation 96%.  GENERAL: The patient is a frail, elderly female in no acute distress.  HEENT: Head atraumatic, normocephalic. Pupils equally round, reactive to light and accommodation. There is no conjunctival pallor. No scleral icterus. Nasal exam  shows no drainage or ulceration. Oropharynx is clear, without any exudate.  NECK: Supple without any JVD. No thyromegaly.  CARDIOVASCULAR: Systolic murmur. No gallops. No  rubs.  LUNGS: Clear to auscultation, bilaterally, without any rales, rhonchi, wheezing. No accessory muscle use.  ABDOMEN: There is no guarding, no rebound. Positive bowel sounds x 4. No hepatosplenomegaly.  EXTREMITIES: No clubbing, cyanosis, or edema.  SKIN: No rash.  LYMPH NODES: None palpable.  MUSCULOSKELETAL: There is no erythema or swelling.  VASCULAR: Good DP and PT pulses.  PSYCHIATRIC: Not anxious or depressed. Awake, alert, oriented x 3.  NEUROLOGIC: No focal deficits.   DIAGNOSTIC DATA: CT of the abdomen and pelvis without contrast showed limited, due to lack of IV contrast, distended bladder, appendix and gallbladder identified, diverticulosis noted.  WBC 17.5, hemoglobin 8.6, platelet count was 188,000. Glucose 97, lipase 209, BUN 30, creatinine 1.39, chloride 109, alkaline phosphatase 167, ALT 64, ALT of 73, albumin 3. Troponin less than 0.02. Lactic acid level 2.6.   ASSESSMENT AND PLAN: The patient is a 81 year old African American female who presents with nausea and vomiting and fevers since this morning, seen in an urgent care.  1.  Nausea, vomiting, and fever, unclear etiology. CT negative. Chest x-ray negative. She does have a distended bladder. We will check a bladder scan. If needed, we will put a Foley in. Empiric IV Levaquin. Follow her blood cultures and urine cultures. We will check her influenza test as well as supportive care.  2.  Hypertension with hypotension. We will hold amlodipine.  3.  Diabetes. Blood glucose normal. Hold Levemir and glipizide. We will just have her on sliding scale.  4.  Neuropathy. Continue gabapentin.  5.  Rheumatoid arthritis. Methotrexate and Hydrochloroquine on hold.   CODE STATUS: Discussed with the patient. With her advanced age, she wishes for a DNR. We will place DNR.   TIME SPENT: 50 minutes on this patient.     ____________________________ Lacie ScottsShreyang H. Allena KatzPatel, MD shp:MT D: 04/07/2014 18:06:37 ET T: 04/07/2014 18:43:51  ET JOB#: [id number]  cc: Brylee Mcgreal H. Allena KatzPatel, MD, <Dictator> Charise CarwinSHREYANG H Nehal Witting MD ELECTRONICALLY SIGNED 04/09/2014 17:13

## 2014-08-21 NOTE — Discharge Summary (Signed)
PATIENT NAME:  Melanie Stewart, Melanie Stewart MR#:  [id number] DATE OF BIRTH:  1915/09/12  DATE OF ADMISSION:  04/07/2014 DATE OF DISCHARGE:  04/08/2014  ADMISSION DIAGNOSES:  1.  Nausea, vomiting, and fever.  2.  Essential hypertension with hypotension.  3.  Diabetes.  4.  Neuropathy.   DISCHARGE DIAGNOSES: 1.  Fever, resolved, unclear etiology.  2.  Nausea and vomiting, resolved.  3.  Essential hypertension.  4.  Diabetes with neuropathy.  5.  History of rheumatoid arthritis.   PHYSICAL EXAMINATION AT DISCHARGE: VITAL SIGNS:  The patient's temperature is 98.3, pulse 63, respirations 18, blood pressure 135/74, and saturation 100% on room air.  GENERAL:  The patient is alert, oriented, and not in acute distress.  CARDIOVASCULAR:  Regular rate and rhythm. She has a 3/6 systolic ejection murmur heard throughout the precordium.  LUNGS:  Clear to auscultation without crackles, rales, rhonchi, or wheezing. Normal to percussion.  ABDOMEN:  Bowel sounds positive. Nontender, nondistended. No hepatosplenomegaly.  EXTREMITIES:  No cyanosis, clubbing, or edema.   LABORATORY AND IMAGING DATA:   1.  White blood cells are 12, hemoglobin 8.3, hematocrit 27, and platelets are 187,000. Sodium is 144, potassium 4.1, chloride 110, bicarbonate 26, BUN 26, creatinine 1.37, and glucose is 72.  2.  Blood cultures were negative to date.  3.  CT of the abdomen showed no evidence of a stone.  4.  Three-way of the abdomen x-rays showed no acute disease.   HOSPITAL COURSE:  This is a very pleasant 81 year old female who presented on 04/07/2014. Apparently, she had fever, nausea, vomiting, and was hypotensive. For further details, please refer to the H and P.   1.  Fever. The patient was empirically started on antibiotics. Her blood cultures were negative. Urinalysis was negative. Chest x-ray was negative. She had no fevers while in the hospital. No indication for an acute infection.  2.  Nausea and vomiting. Likely the  patient could have had a 24-hour viral syndrome. Her nausea and vomiting improved. She tolerated a regular diet.  3.  Essential hypertension. The patient initially had some hypotension. Blood pressure is much improved. She will continue outpatient medications.  4.  Diabetes with neuropathy. The patient is on gabapentin, which we will continue.  5.  History of rheumatoid arthritis. The patient will resume her outpatient medications.   DISCHARGE MEDICATIONS: 1.  Norvasc 5 mg daily.  2.  Aspirin 81 mg daily.  3.  Super B complex 1 daily.  4.  Folic acid 1 mg daily.  5.  Glipizide 5 mg daily.  6.  Hydroxychloroquine 200 mg 2 tablets daily.  7.  Insulin one dose 3 times a day.  8.  Detemir one dose at bedtime.  9.  Methotrexate 2.5 two times a month.  10.  Pantoprazole 40 mg daily.  11.  Prednisolone 5 mg daily.  12.  Sucralfate 1 gram 4 times a day before meals and at bedtime.  13.  Magnesium gluconate 250 mg daily.  14.  Levemir 15 units subcutaneously daily.  15.  Gabapentin 600 mg b.i.d.   DISPOSITION:  Discharged with home health with nurse and nurse aide.   DISCHARGE DIET:  ADA diet.   DISCHARGE ACTIVITY:  As tolerated.   DISCHARGE FOLLOWUP:  The patient will need to follow up with her primary care physician.   TIME SPENT:  Approximately 40 minutes on discharge.   CONDITION:  The patient was stable for discharge.    ____________________________ Skylie Hiott P. Juliene Pina, MD spm:nb  D: 04/08/2014 13:18:47 ET T: 04/09/2014 00:32:26 ET JOB#: [id number]  cc: Melva Faux P. Juliene PinaMody, MD, <Dictator> Janyth ContesSITAL P Leoni Goodness MD ELECTRONICALLY SIGNED 04/09/2014 13:52

## 2015-01-29 DEATH — deceased

## 2016-08-06 IMAGING — CT CT ABD-PELV W/O CM
2 of 4 series · 15 of 46 positions shown, 17 images · non-contrast
Comparison: Acute abdominal series 09307 0715 hr today.

CLINICAL DATA: [AGE] female with right lower quadrant
abdominal pain radiating to the right flank. Nausea vomiting.
Chills. Initial encounter.

EXAM:
CT ABDOMEN AND PELVIS WITHOUT CONTRAST
TECHNIQUE: Multidetector CT imaging of the abdomen and pelvis was performed
following the standard protocol without IV contrast.

[Series 2: stone standard full · axial · 0.64mm/px · z∈[+326,+661]mm · 12 of 78 slices shown, 14 images]
[im 7/78  soft-tissue]
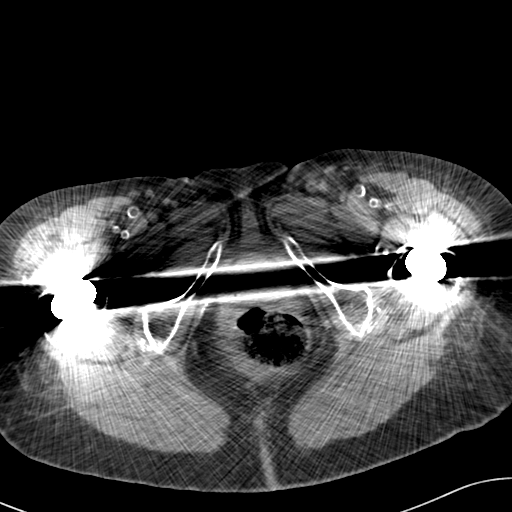
[im 7/78  bone]
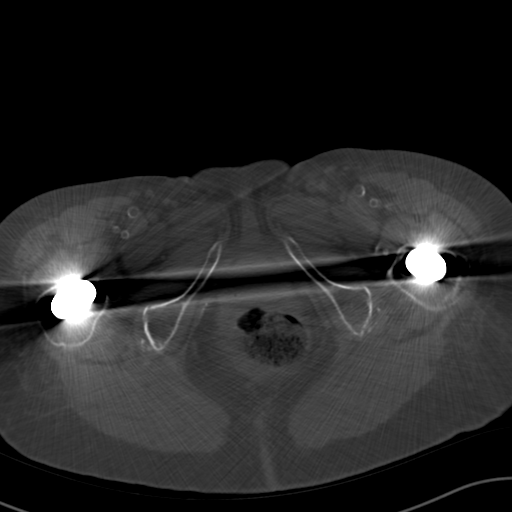
[im 13/78  soft-tissue]
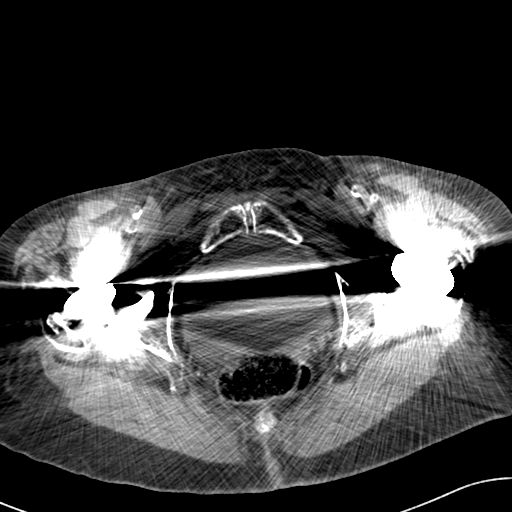
[im 19/78  soft-tissue]
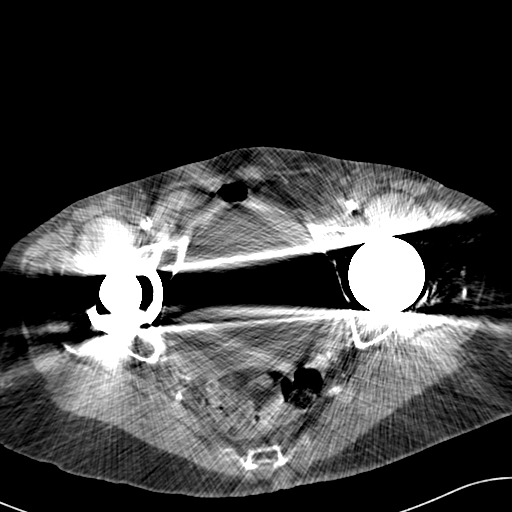
[im 25/78  soft-tissue]
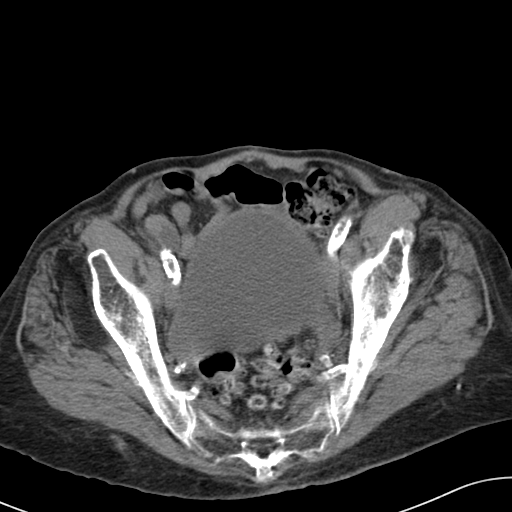
[im 31/78  soft-tissue]
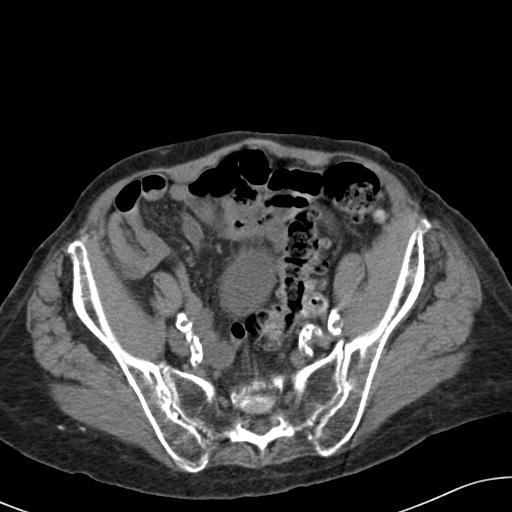
[im 37/78  soft-tissue]
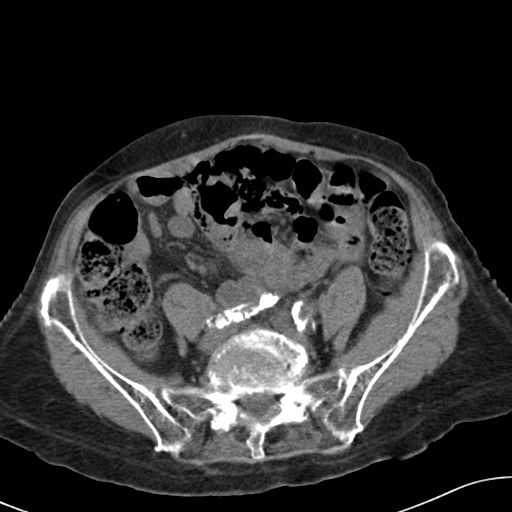
[im 44/78  soft-tissue]
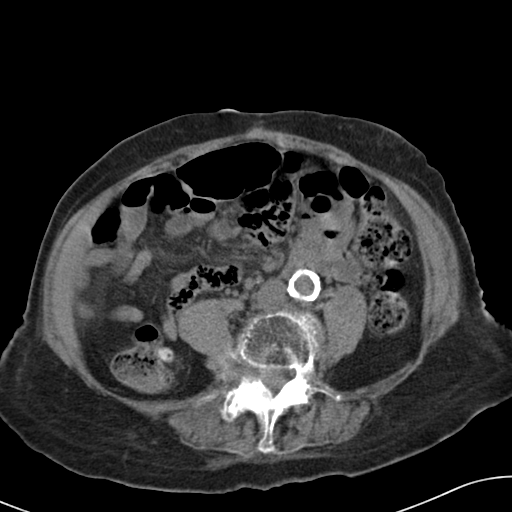
[im 50/78  soft-tissue]
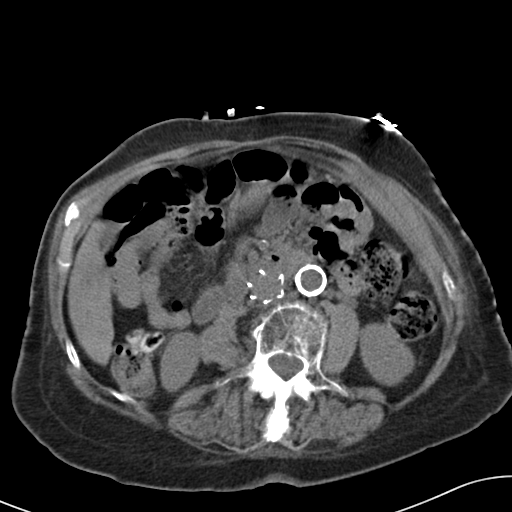
[im 56/78  soft-tissue]
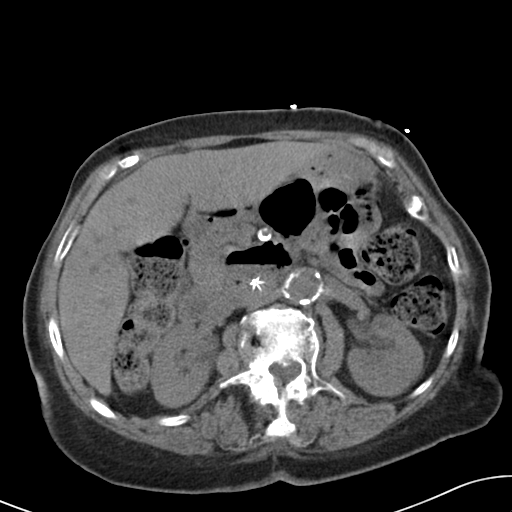
[im 56/78  bone]
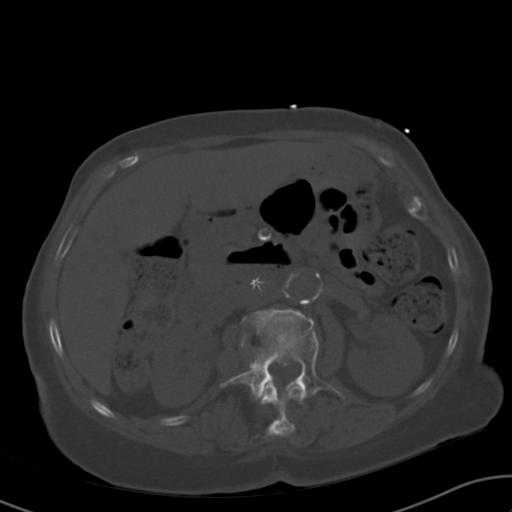
[im 62/78  soft-tissue]
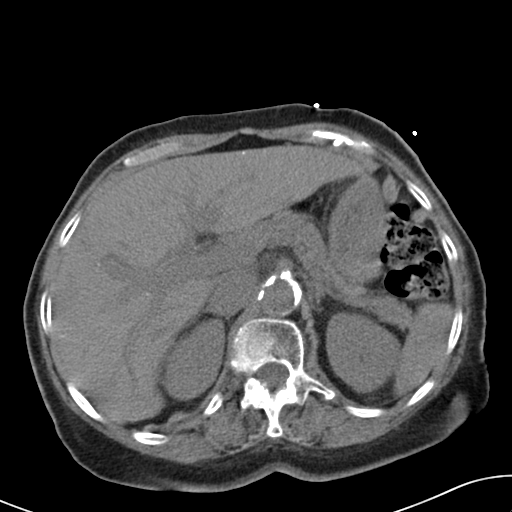
[im 68/78  soft-tissue]
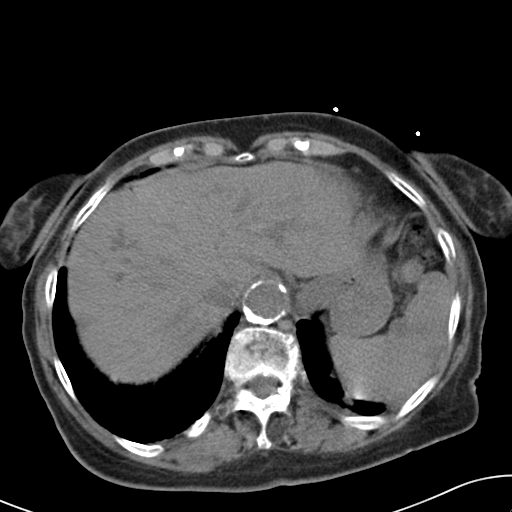
[im 74/78  soft-tissue]
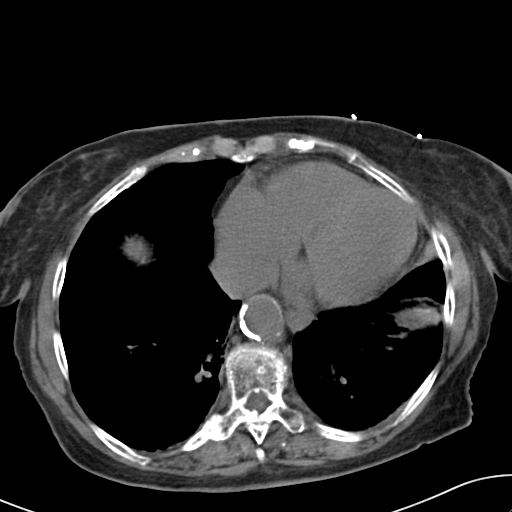

[Series 5: cor stone standard full · coronal · 0.59mm/px · 3 of 121 slices shown]
[im 41/121  soft-tissue]
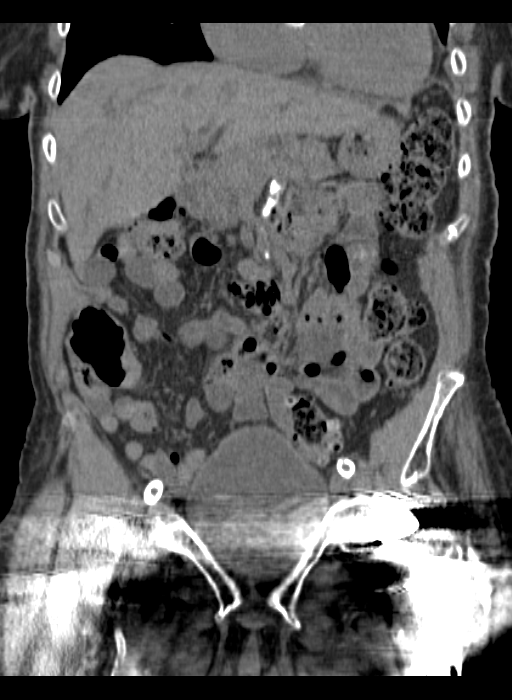
[im 54/121  soft-tissue]
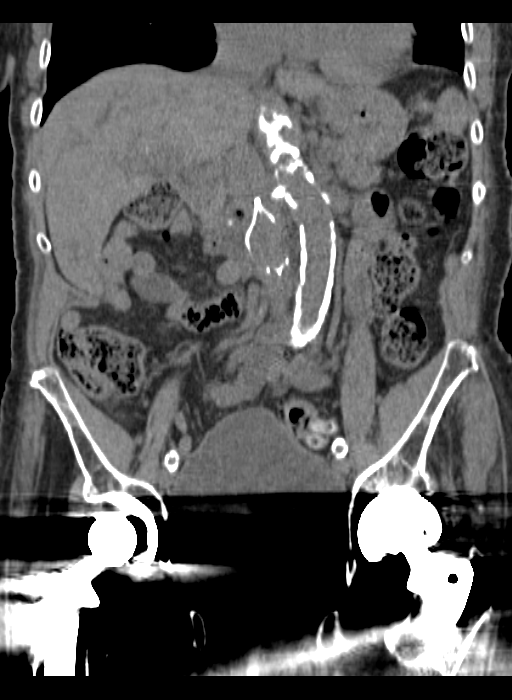
[im 67/121  soft-tissue]
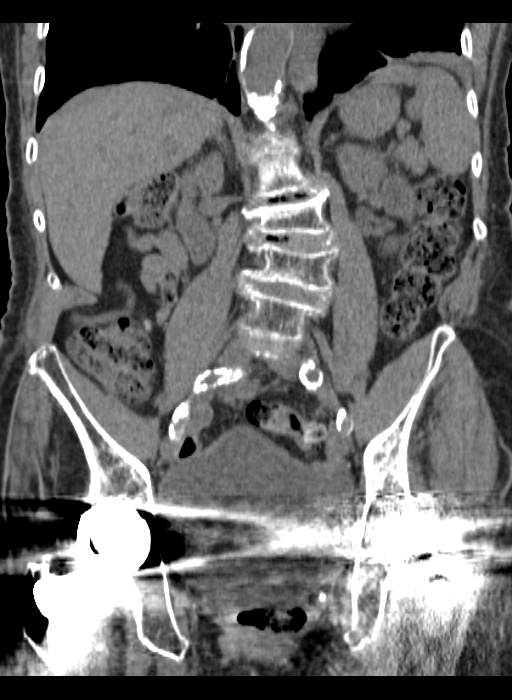

[15 of 46 positions shown; findings below may reference images not displayed]

FINDINGS: Cardiomegaly. Tortuous descending thoracic aorta with calcified
plaque. No pericardial or pleural effusion. Left lower lobe
atelectasis. Mild reticular increased interstitial markings in the
right middle lobe, somewhat resemble tree-in-bud pattern. Less
pronounced increased reticular opacity in posterior basal segment of
the right lower lobe.

Osteopenia. Compression fractures of all lumbar levels except L1.
Sacrum and SI joints appear intact. Bilateral hip arthroplasties
with streak artifact limiting some visualization of the pelvis. No
definite acute pelvic fracture.

Extensive Aortoiliac calcified atherosclerosis noted. IVC filter in
place. Bilateral calcified injection granulomas at both flanks.

Severe diverticulosis of the sigmoid colon, no active inflammation
identified. No pelvic free fluid. Negative rectum. Distended
bladder. Left posterior bladder diverticulum suspected (series 2,
image 57). No perivesical stranding.

Moderate to severe diverticulosis continues into the distal aspect
of the descending colon. No active inflammation. More proximal left
colon and splenic flexure are normal aside from retained stool.
Redundant transverse colon. Moderate to severe diverticulosis in the
ascending colon. The cecum is largely spared. There are small
diverticula also in the terminal ileum best seen on coronal image
47. There are small diverticula elsewhere in the distal small bowel.
No appendix is identified. No pericecal inflammation is identified.
No dilated small bowel. No oral contrast administered. Decompressed
stomach. 4-5 cm diverticula of the duodenum suspected as it crosses
midline. Otherwise negative noncontrast duodenum.

Noncontrast liver, spleen, pancreas and adrenal glands are within
normal limits. The gallbladder is not definitely identified. No
abdominal free fluid.

No left hydronephrosis or perinephric stranding. No left
hydronephrosis or perinephric stranding. No nephrolithiasis. Distal
ureters difficult to delineate in part due to streak artifact from
hip arthroplasty hardware. No proximal hydroureter.
IMPRESSION: 1. Limited due to absent oral and IV contrast, and streak artifact
in the pelvis from bilateral hip arthroplasties.
2. Distended bladder, query bladder outlet obstruction. No definite
obstructive uropathy.
3. Appendix and gallbladder not identified and may be surgically
absent.
4. Diverticulosis in the colon and distal small bowel without active
inflammation identified. No evidence of bowel obstruction.
5. Pulmonary atelectasis. Difficult to exclude early infection in
the right middle lobe.
6. Diffuse lumbar compression fractures, age indeterminate but favor
chronic.

## 2018-02-04 NOTE — Progress Notes (Signed)
REVIEWED-NO ADDITIONAL RECOMMENDATIONS.
